# Patient Record
Sex: Female | Born: 1995 | Race: Black or African American | Hispanic: No | Marital: Single | Smoking: Never smoker
Health system: Southern US, Community
[De-identification: ages and names within clinical notes are randomized; demographics above are authoritative.]

## PROBLEM LIST (undated history)

## (undated) ENCOUNTER — Inpatient Hospital Stay (HOSPITAL_COMMUNITY): Payer: Self-pay

## (undated) DIAGNOSIS — F319 Bipolar disorder, unspecified: Secondary | ICD-10-CM

## (undated) DIAGNOSIS — K2970 Gastritis, unspecified, without bleeding: Secondary | ICD-10-CM

## (undated) DIAGNOSIS — F329 Major depressive disorder, single episode, unspecified: Secondary | ICD-10-CM

## (undated) DIAGNOSIS — Z5189 Encounter for other specified aftercare: Secondary | ICD-10-CM

## (undated) DIAGNOSIS — F32A Depression, unspecified: Secondary | ICD-10-CM

## (undated) DIAGNOSIS — D649 Anemia, unspecified: Secondary | ICD-10-CM

## (undated) DIAGNOSIS — F419 Anxiety disorder, unspecified: Secondary | ICD-10-CM

## (undated) DIAGNOSIS — L409 Psoriasis, unspecified: Secondary | ICD-10-CM

## (undated) DIAGNOSIS — H539 Unspecified visual disturbance: Secondary | ICD-10-CM

## (undated) DIAGNOSIS — Z889 Allergy status to unspecified drugs, medicaments and biological substances status: Secondary | ICD-10-CM

## (undated) DIAGNOSIS — G43909 Migraine, unspecified, not intractable, without status migrainosus: Secondary | ICD-10-CM

## (undated) HISTORY — PX: WISDOM TOOTH EXTRACTION: SHX21

## (undated) HISTORY — PX: DILATION AND CURETTAGE OF UTERUS: SHX78

## (undated) HISTORY — DX: Encounter for other specified aftercare: Z51.89

## (undated) HISTORY — DX: Gastritis, unspecified, without bleeding: K29.70

---
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
## 2015-01-28 ENCOUNTER — Emergency Department (HOSPITAL_COMMUNITY): Payer: Medicaid Other

## 2015-01-28 ENCOUNTER — Emergency Department (HOSPITAL_COMMUNITY)
Admission: EM | Admit: 2015-01-28 | Discharge: 2015-01-28 | Disposition: A | Discharge status: Home / Self Care | Payer: Medicaid Other | Attending: Emergency Medicine | Admitting: Emergency Medicine

## 2015-01-28 ENCOUNTER — Encounter (HOSPITAL_COMMUNITY): Payer: Self-pay | Admitting: *Deleted

## 2015-01-28 DIAGNOSIS — Z8719 Personal history of other diseases of the digestive system: Secondary | ICD-10-CM | POA: Diagnosis not present

## 2015-01-28 DIAGNOSIS — Z8659 Personal history of other mental and behavioral disorders: Secondary | ICD-10-CM | POA: Insufficient documentation

## 2015-01-28 DIAGNOSIS — Z872 Personal history of diseases of the skin and subcutaneous tissue: Secondary | ICD-10-CM | POA: Diagnosis not present

## 2015-01-28 DIAGNOSIS — N83201 Unspecified ovarian cyst, right side: Secondary | ICD-10-CM

## 2015-01-28 DIAGNOSIS — Z3202 Encounter for pregnancy test, result negative: Secondary | ICD-10-CM | POA: Insufficient documentation

## 2015-01-28 DIAGNOSIS — R1030 Lower abdominal pain, unspecified: Secondary | ICD-10-CM | POA: Diagnosis present

## 2015-01-28 DIAGNOSIS — Z8669 Personal history of other diseases of the nervous system and sense organs: Secondary | ICD-10-CM | POA: Insufficient documentation

## 2015-01-28 DIAGNOSIS — Z8679 Personal history of other diseases of the circulatory system: Secondary | ICD-10-CM | POA: Insufficient documentation

## 2015-01-28 DIAGNOSIS — N8320 Unspecified ovarian cysts: Secondary | ICD-10-CM | POA: Diagnosis not present

## 2015-01-28 DIAGNOSIS — N83202 Unspecified ovarian cyst, left side: Secondary | ICD-10-CM

## 2015-01-28 DIAGNOSIS — Z79899 Other long term (current) drug therapy: Secondary | ICD-10-CM | POA: Diagnosis not present

## 2015-01-28 DIAGNOSIS — R109 Unspecified abdominal pain: Secondary | ICD-10-CM

## 2015-01-28 LAB — CBC WITH DIFFERENTIAL/PLATELET
BASOS PCT: 1 % (ref 0–1)
Basophils Absolute: 0 10*3/uL (ref 0.0–0.1)
EOS PCT: 1 % (ref 0–5)
Eosinophils Absolute: 0.1 10*3/uL (ref 0.0–0.7)
HCT: 41.3 % (ref 36.0–46.0)
Hemoglobin: 12.9 g/dL (ref 12.0–15.0)
Lymphocytes Relative: 35 % (ref 12–46)
Lymphs Abs: 2.8 10*3/uL (ref 0.7–4.0)
MCH: 24.9 pg — ABNORMAL LOW (ref 26.0–34.0)
MCHC: 31.2 g/dL (ref 30.0–36.0)
MCV: 79.6 fL (ref 78.0–100.0)
Monocytes Absolute: 0.7 10*3/uL (ref 0.1–1.0)
Monocytes Relative: 9 % (ref 3–12)
Neutro Abs: 4.5 10*3/uL (ref 1.7–7.7)
Neutrophils Relative %: 54 % (ref 43–77)
PLATELETS: 307 10*3/uL (ref 150–400)
RBC: 5.19 MIL/uL — ABNORMAL HIGH (ref 3.87–5.11)
RDW: 14.5 % (ref 11.5–15.5)
WBC: 8.1 10*3/uL (ref 4.0–10.5)

## 2015-01-28 LAB — URINALYSIS, ROUTINE W REFLEX MICROSCOPIC
Bilirubin Urine: NEGATIVE
Glucose, UA: NEGATIVE mg/dL
Hgb urine dipstick: NEGATIVE
Ketones, ur: NEGATIVE mg/dL
Leukocytes, UA: NEGATIVE
Nitrite: NEGATIVE
Protein, ur: NEGATIVE mg/dL
Specific Gravity, Urine: 1.027 (ref 1.005–1.030)
UROBILINOGEN UA: 0.2 mg/dL (ref 0.0–1.0)
pH: 6 (ref 5.0–8.0)

## 2015-01-28 LAB — COMPREHENSIVE METABOLIC PANEL
ALT: 34 U/L (ref 14–54)
AST: 23 U/L (ref 15–41)
Albumin: 3.9 g/dL (ref 3.5–5.0)
Alkaline Phosphatase: 76 U/L (ref 38–126)
Anion gap: 8 (ref 5–15)
BILIRUBIN TOTAL: 0.4 mg/dL (ref 0.3–1.2)
BUN: 21 mg/dL — ABNORMAL HIGH (ref 6–20)
CO2: 27 mmol/L (ref 22–32)
CREATININE: 0.63 mg/dL (ref 0.44–1.00)
Calcium: 9.2 mg/dL (ref 8.9–10.3)
Chloride: 104 mmol/L (ref 101–111)
Glucose, Bld: 94 mg/dL (ref 65–99)
Potassium: 3.7 mmol/L (ref 3.5–5.1)
Sodium: 139 mmol/L (ref 135–145)
Total Protein: 7.6 g/dL (ref 6.5–8.1)

## 2015-01-28 LAB — WET PREP, GENITAL
Trich, Wet Prep: NONE SEEN
Yeast Wet Prep HPF POC: NONE SEEN

## 2015-01-28 LAB — POC URINE PREG, ED: PREG TEST UR: NEGATIVE

## 2015-01-28 LAB — HCG, QUANTITATIVE, PREGNANCY

## 2015-01-28 LAB — LIPASE, BLOOD: LIPASE: 55 U/L — AB (ref 22–51)

## 2015-01-28 MED ORDER — IOHEXOL 300 MG/ML  SOLN
100.0000 mL | Freq: Once | INTRAMUSCULAR | Status: AC | PRN
  Administered 2015-01-28: 100 mL via INTRAVENOUS

## 2015-01-28 MED ORDER — ACETAMINOPHEN 325 MG PO TABS
325.0000 mg | ORAL_TABLET | Freq: Once | ORAL | Status: AC
  Administered 2015-01-28: 325 mg via ORAL
  Filled 2015-01-28: qty 1

## 2015-01-28 MED ORDER — SODIUM CHLORIDE 0.9 % IV BOLUS (SEPSIS)
1000.0000 mL | Freq: Once | INTRAVENOUS | Status: AC
  Administered 2015-01-28: 1000 mL via INTRAVENOUS

## 2015-01-28 MED ORDER — IOHEXOL 300 MG/ML  SOLN
50.0000 mL | Freq: Once | INTRAMUSCULAR | Status: AC | PRN
  Administered 2015-01-28: 50 mL via ORAL

## 2015-01-28 NOTE — ED Notes (Addendum)
Patient c/o diffuse sharp abdominal pain x3 weeks with nausea, headache and fatigue.  Patient also c/o craving odd combinations of food.  Patient states she took 10 OTC urine pregnancy tests and the fifth one was positive.  Patient denies hematuria, but endorses urinary frequency/urgency.  Patient c/o increased hunger.  Patient states she took Tylenol to treat pain with no relief.

## 2015-01-28 NOTE — ED Notes (Signed)
Patient transported to Ultrasound 

## 2015-01-28 NOTE — ED Provider Notes (Signed)
CSN: [id number]     Arrival date & time 01/28/15  8469 History   First MD Initiated Contact with Patient 01/28/15 1031     Chief Complaint  Patient presents with  . Abdominal Pain     (Consider location/radiation/quality/duration/timing/severity/associated sxs/prior Treatment) The history is provided by the patient. No language interpreter was used.  Traci Castro is an 19 y/o F with PMHx of psoriasis, gastritis, migraine, anxiety, depression presenting to the ED with abdominal pain that has been ongoing for approximately 3 weeks. Patient reports that her pain is localized to the suprapubic region and radiates down her whole entire abdomen described as a constant throbbing sensation with intermittent stabbing pain. She's been having diarrhea continuously for the past 3 weeks-denied blood or mucus. Patient reports that she's been having normal bowel movements. Reports that her last menstrual periods 01/08/2015. States she is currently on birth control, but cannot remember what. Reported that she has history of headaches, reported that she continues to have headaches on and off-similar from previous has been using Tylenol with relief. Reported that over the past couple of months she has been having shortness of breath only with ambulation. Denied fever, chills, dysuria, decreased urine, hematuria, melena, hematochezia, blurred vision, sudden loss of vision, neck pain, neck stiffness, chest pain, difficult to breathing, cough, hemoptysis, nasal congestion, sore throat, difficulty swallowing, throat closing sensation, vaginal pain, vaginal discharge, vaginal bleeding, numbness, tingling. PCP Dr. Edward Qualia   Past Medical History  Diagnosis Date  . Psoriasis   . Gastritis     2 bouts  . Vision abnormalities     wears glasses  . Migraine   . Anxiety   . Depression    Past Surgical History  Procedure Laterality Date  . Esophagogastroduodenoscopy N/A 10/17/2012    Procedure: ESOPHAGOGASTRODUODENOSCOPY  (EGD);  Surgeon: Jon Gills, MD;  Location: [facility] OR;  Service: Gastroenterology;  Laterality: N/A;   Family History  Problem Relation Age of Onset  . Cholelithiasis Mother   . Hyperlipidemia Mother   . Hypertension Mother   . Mental illness Mother   . Cholelithiasis Maternal Uncle   . Hyperlipidemia Maternal Uncle   . Hypertension Maternal Uncle   . Cholelithiasis Maternal Grandmother   . Arthritis Maternal Grandmother   . COPD Maternal Grandmother   . Depression Maternal Grandmother   . Diabetes Maternal Grandmother   . Heart disease Maternal Grandmother   . Hyperlipidemia Maternal Grandmother   . Hypertension Maternal Grandmother   . Ulcers Neg Hx   . Alcohol abuse Neg Hx   . Asthma Neg Hx   . Birth defects Neg Hx   . Cancer Neg Hx   . Drug abuse Neg Hx   . Early death Neg Hx   . Hearing loss Neg Hx   . Kidney disease Neg Hx   . Mental retardation Neg Hx   . Miscarriages / Stillbirths Neg Hx   . Stroke Neg Hx   . Vision loss Neg Hx   . Learning disabilities Paternal Grandmother   . Mental illness Paternal Grandmother    History  Substance Use Topics  . Smoking status: Never Smoker   . Smokeless tobacco: Never Used  . Alcohol Use: No   OB History    No data available     Review of Systems  Constitutional: Negative for fever and chills.  Respiratory: Negative for chest tightness.   Cardiovascular: Negative for chest pain.  Gastrointestinal: Positive for abdominal pain and diarrhea. Negative for nausea, vomiting,  constipation, blood in stool and anal bleeding.  Genitourinary: Positive for pelvic pain. Negative for dysuria, vaginal bleeding, vaginal discharge and vaginal pain.  Musculoskeletal: Negative for back pain, neck pain and neck stiffness.  Neurological: Positive for headaches.      Allergies  Tramadol  Home Medications   Prior to Admission medications   Medication Sig Start Date End Date Taking? Authorizing Provider  acetaminophen (TYLENOL) 500  MG tablet Take 1,000 mg by mouth every 6 (six) hours as needed for moderate pain.    Yes Historical Provider, MD  adalimumab (HUMIRA PEN) 40 MG/0.8ML injection Inject 40 mg into the skin every 14 (fourteen) days.   Yes Historical Provider, MD  HYDROcodone-acetaminophen (NORCO) 5-325 MG per tablet Take 1 tablet by mouth every 6 (six) hours as needed for severe pain. Patient not taking: Reported on 01/28/2015 08/09/14   Pricilla Loveless, MD  ondansetron (ZOFRAN ODT) 4 MG disintegrating tablet Take 1 tablet (4 mg total) by mouth every 8 (eight) hours as needed for nausea or vomiting. Patient not taking: Reported on 01/28/2015 07/29/14   Kaitlyn Szekalski, PA-C   BP 105/57 mmHg  Pulse 66  Temp(Src) 98.2 F (36.8 C) (Oral)  Resp 18  Ht  (1.6 m)  Wt 285 lb (129.275 kg)  BMI 50.50 kg/m2  SpO2 100%  LMP 01/08/2015 Physical Exam  Constitutional: She is oriented to person, place, and time. She appears well-developed and well-nourished. No distress.  Patient pleasant and interactive, and giggling during examination  HENT:  Head: Normocephalic and atraumatic.  Mouth/Throat: Oropharynx is clear and moist. No oropharyngeal exudate.  Eyes: Conjunctivae and EOM are normal. Pupils are equal, round, and reactive to light. Right eye exhibits no discharge. Left eye exhibits no discharge.  Neck: Normal range of motion. Neck supple. No tracheal deviation present.  Cardiovascular: Normal rate, regular rhythm and normal heart sounds.  Exam reveals no friction rub.   No murmur heard. Pulses:      Radial pulses are 2+ on the right side, and 2+ on the left side.       Dorsalis pedis pulses are 2+ on the right side, and 2+ on the left side.  Pulmonary/Chest: Effort normal and breath sounds normal. No respiratory distress. She has no wheezes. She has no rales. She exhibits no tenderness.  Patient is able to speak in full sentences without difficulty Negative use of accessory muscles Negative stridor  Abdominal:  Soft. Bowel sounds are normal. She exhibits no distension. There is tenderness (Diffuse tenderness, suprapubic mostly). There is no rebound and no guarding.  Overweight   Genitourinary:  Pelvic Exam: Negative swelling, erythema, inflammation, lesions, sores, deformities identified to the external genitalia. Negative erythema induration or fluctuance. Negative bright red blood in the vaginal vault. Thin white discharge identified negative odor, scant amount. Cervix identified negative friability. Positive CMT. Positive bilateral adnexal tenderness noted on examination. Exam chaperoned with nurse, Vernona Rieger  Musculoskeletal: Normal range of motion.  Lymphadenopathy:    She has no cervical adenopathy.  Neurological: She is alert and oriented to person, place, and time. No cranial nerve deficit. She exhibits normal muscle tone. Coordination normal. GCS eye subscore is 4. GCS verbal subscore is 5. GCS motor subscore is 6.  Skin: Skin is warm and dry. No rash noted. She is not diaphoretic. No erythema.  Psychiatric: She has a normal mood and affect. Her behavior is normal. Thought content normal.  Nursing note and vitals reviewed.   ED Course  Procedures (including critical care  time)  Results for orders placed or performed during the hospital encounter of 01/28/15  Wet prep, genital  Result Value Ref Range   Yeast Wet Prep HPF POC NONE SEEN NONE SEEN   Trich, Wet Prep NONE SEEN NONE SEEN   Clue Cells Wet Prep HPF POC FEW (A) NONE SEEN   WBC, Wet Prep HPF POC FEW (A) NONE SEEN  Urinalysis, Routine w reflex microscopic (not at [facility])  Result Value Ref Range   Color, Urine YELLOW YELLOW   APPearance CLOUDY (A) CLEAR   Specific Gravity, Urine 1.027 1.005 - 1.030   pH 6.0 5.0 - 8.0   Glucose, UA NEGATIVE NEGATIVE mg/dL   Hgb urine dipstick NEGATIVE NEGATIVE   Bilirubin Urine NEGATIVE NEGATIVE   Ketones, ur NEGATIVE NEGATIVE mg/dL   Protein, ur NEGATIVE NEGATIVE mg/dL   Urobilinogen, UA 0.2 0.0 -  1.0 mg/dL   Nitrite NEGATIVE NEGATIVE   Leukocytes, UA NEGATIVE NEGATIVE  CBC with Differential/Platelet  Result Value Ref Range   WBC 8.1 4.0 - 10.5 K/uL   RBC 5.19 (H) 3.87 - 5.11 MIL/uL   Hemoglobin 12.9 12.0 - 15.0 g/dL   HCT 53.6 64.4 - 03.4 %   MCV 79.6 78.0 - 100.0 fL   MCH 24.9 (L) 26.0 - 34.0 pg   MCHC 31.2 30.0 - 36.0 g/dL   RDW 74.2 59.5 - 63.8 %   Platelets 307 150 - 400 K/uL   Neutrophils Relative % 54 43 - 77 %   Neutro Abs 4.5 1.7 - 7.7 K/uL   Lymphocytes Relative 35 12 - 46 %   Lymphs Abs 2.8 0.7 - 4.0 K/uL   Monocytes Relative 9 3 - 12 %   Monocytes Absolute 0.7 0.1 - 1.0 K/uL   Eosinophils Relative 1 0 - 5 %   Eosinophils Absolute 0.1 0.0 - 0.7 K/uL   Basophils Relative 1 0 - 1 %   Basophils Absolute 0.0 0.0 - 0.1 K/uL  Comprehensive metabolic panel  Result Value Ref Range   Sodium 139 135 - 145 mmol/L   Potassium 3.7 3.5 - 5.1 mmol/L   Chloride 104 101 - 111 mmol/L   CO2 27 22 - 32 mmol/L   Glucose, Bld 94 65 - 99 mg/dL   BUN 21 (H) 6 - 20 mg/dL   Creatinine, Ser 7.56 0.44 - 1.00 mg/dL   Calcium 9.2 8.9 - 43.3 mg/dL   Total Protein 7.6 6.5 - 8.1 g/dL   Albumin 3.9 3.5 - 5.0 g/dL   AST 23 15 - 41 U/L   ALT 34 14 - 54 U/L   Alkaline Phosphatase 76 38 - 126 U/L   Total Bilirubin 0.4 0.3 - 1.2 mg/dL   GFR calc non Af Amer >60 >60 mL/min   GFR calc Af Amer >60 >60 mL/min   Anion gap 8 5 - 15  hCG, quantitative, pregnancy  Result Value Ref Range   hCG, Beta Chain, Quant, S <1 <5 mIU/mL  Lipase, blood  Result Value Ref Range   Lipase 55 (H) 22 - 51 U/L  POC Urine Pregnancy, ED (do NOT order at [facility])  Result Value Ref Range   Preg Test, Ur NEGATIVE NEGATIVE   Labs Review Labs Reviewed  WET PREP, GENITAL - Abnormal; Notable for the following:    Clue Cells Wet Prep HPF POC FEW (*)    WBC, Wet Prep HPF POC FEW (*)    All other components within normal limits  URINALYSIS, ROUTINE W REFLEX  MICROSCOPIC (NOT AT [facility]) - Abnormal; Notable for the  following:    APPearance CLOUDY (*)    All other components within normal limits  CBC WITH DIFFERENTIAL/PLATELET - Abnormal; Notable for the following:    RBC 5.19 (*)    MCH 24.9 (*)    All other components within normal limits  COMPREHENSIVE METABOLIC PANEL - Abnormal; Notable for the following:    BUN 21 (*)    All other components within normal limits  LIPASE, BLOOD - Abnormal; Notable for the following:    Lipase 55 (*)    All other components within normal limits  HCG, QUANTITATIVE, PREGNANCY  POC URINE PREG, ED  GC/CHLAMYDIA PROBE AMP ([place]) NOT AT [facility]    Imaging Review [place] Transvaginal Non-ob  01/28/2015   CLINICAL DATA:  Pelvic pain for 3 weeks with nausea and vomiting. Reported negative pregnancy test  EXAM: TRANSABDOMINAL AND TRANSVAGINAL ULTRASOUND OF PELVIS  DOPPLER ULTRASOUND OF OVARIES  TECHNIQUE: Study was performed transabdominally to optimize pelvic field of view evaluation and transvaginally to optimize internal visceral architecture evaluation.  Color and duplex Doppler ultrasound was utilized to evaluate blood flow to the ovaries.  COMPARISON:  August 09, 2014  FINDINGS: Uterus  Measurements: 8.5 x 3.6 x 5.2 cm. No fibroids or other mass visualized.  Endometrium  Thickness: 15 mm.  No focal abnormality visualized.  Right ovary  Measurements: 5.6 x 3.5 x 4.1 cm. There is a cyst arising in the right ovary measuring 3.4 x 2.9 x 2.9 cm. Normal appearance/no adnexal mass.  Left ovary  Measurements: 2.6 x 2.7 x 3.6 cm. There is a cyst in adjacent to the left ovary and possibly arising eccentrically from the left ovary measuring 3.2 x 2.3 x 3.3 cm.  Pulsed Doppler evaluation of both ovaries demonstrates normal low-resistance arterial and venous waveforms. The peak systolic velocity in the right ovary is 8.9 centimeter/second with an end-diastolic velocity of 4.9 cm/sec. The peak systolic velocity in the left ovary is 8.0 cm with end-diastolic velocity of 3.6  centimeter/second.  Other findings  Trace free fluid.  IMPRESSION: There is a small cyst in each ovary. These cysts appear to represent simple cysts. No other pelvic or adnexal masses. This is almost certainly benign, and no specific imaging follow up is recommended according to the Society of Radiologists in Ultrasound2010 Consensus Conference Statement (D Lenis NoonLevine et al. Management of Asymptomatic Ovarian and Other Adnexal Cysts Imaged at [place]: Society of Radiologists in Ultrasound Consensus Conference Statement 2010. Radiology 256 (Sept 2010): 943-954.). No intrauterine lesion. No evidence of ovarian torsion on either side. Trace free fluid may be physiologic.   Electronically Signed   By: Bretta BangWilliam  Woodruff III M.D.   On: 01/28/2015 13:29   [place] Pelvis Complete  01/28/2015   CLINICAL DATA:  Pelvic pain for 3 weeks with nausea and vomiting. Reported negative pregnancy test  EXAM: TRANSABDOMINAL AND TRANSVAGINAL ULTRASOUND OF PELVIS  DOPPLER ULTRASOUND OF OVARIES  TECHNIQUE: Study was performed transabdominally to optimize pelvic field of view evaluation and transvaginally to optimize internal visceral architecture evaluation.  Color and duplex Doppler ultrasound was utilized to evaluate blood flow to the ovaries.  COMPARISON:  August 09, 2014  FINDINGS: Uterus  Measurements: 8.5 x 3.6 x 5.2 cm. No fibroids or other mass visualized.  Endometrium  Thickness: 15 mm.  No focal abnormality visualized.  Right ovary  Measurements: 5.6 x 3.5 x 4.1 cm. There is a cyst arising in the right ovary measuring 3.4 x  2.9 x 2.9 cm. Normal appearance/no adnexal mass.  Left ovary  Measurements: 2.6 x 2.7 x 3.6 cm. There is a cyst in adjacent to the left ovary and possibly arising eccentrically from the left ovary measuring 3.2 x 2.3 x 3.3 cm.  Pulsed Doppler evaluation of both ovaries demonstrates normal low-resistance arterial and venous waveforms. The peak systolic velocity in the right ovary is 8.9 centimeter/second with an  end-diastolic velocity of 4.9 cm/sec. The peak systolic velocity in the left ovary is 8.0 cm with end-diastolic velocity of 3.6 centimeter/second.  Other findings  Trace free fluid.  IMPRESSION: There is a small cyst in each ovary. These cysts appear to represent simple cysts. No other pelvic or adnexal masses. This is almost certainly benign, and no specific imaging follow up is recommended according to the Society of Radiologists in Ultrasound2010 Consensus Conference Statement (D Lenis Noon et al. Management of Asymptomatic Ovarian and Other Adnexal Cysts Imaged at [place]: Society of Radiologists in Ultrasound Consensus Conference Statement 2010. Radiology 256 (Sept 2010): 943-954.). No intrauterine lesion. No evidence of ovarian torsion on either side. Trace free fluid may be physiologic.   Electronically Signed   By: Bretta Bang III M.D.   On: 01/28/2015 13:29   Ct Abdomen Pelvis W Contrast  01/28/2015   CLINICAL DATA:  Sharp abdominal pain with nausea for 3 weeks.  EXAM: CT ABDOMEN AND PELVIS WITH CONTRAST  TECHNIQUE: Multidetector CT imaging of the abdomen and pelvis was performed using the standard protocol following bolus administration of intravenous contrast.  CONTRAST:  50mL OMNIPAQUE IOHEXOL 300 MG/ML SOLN, OMNIPAQUE IOHEXOL 300 MG/ML SOLN  COMPARISON:  CT scan dated 07/29/2014 and pelvic ultrasound dated 01/28/2015  FINDINGS: Liver, biliary tree, spleen, pancreas, adrenal glands, and kidneys are normal. Bowel is normal including the terminal ileum and appendix.  No adenopathy, free air, or free fluid.  Uterus is normal. 3.4 cm cyst on the otherwise normal right ovary as demonstrated on the pelvic ultrasound of this same date. This is new since the CT scan of 07/29/2014.  There is a stable 2.4 cm cyst on the left ovary, unchanged since the prior CT scan. Uterus and bladder appear normal. No osseous abnormality.  IMPRESSION: 3.4 cm cyst on the right ovary, new since December 2015. Stable 2.4 cm  cyst on the left ovary. Otherwise, normal exam.   Electronically Signed   By: Francene Boyers M.D.   On: 01/28/2015 16:22   [place] Art/ven Flow Abd Pelv Doppler  01/28/2015   CLINICAL DATA:  Pelvic pain for 3 weeks with nausea and vomiting. Reported negative pregnancy test  EXAM: TRANSABDOMINAL AND TRANSVAGINAL ULTRASOUND OF PELVIS  DOPPLER ULTRASOUND OF OVARIES  TECHNIQUE: Study was performed transabdominally to optimize pelvic field of view evaluation and transvaginally to optimize internal visceral architecture evaluation.  Color and duplex Doppler ultrasound was utilized to evaluate blood flow to the ovaries.  COMPARISON:  August 09, 2014  FINDINGS: Uterus  Measurements: 8.5 x 3.6 x 5.2 cm. No fibroids or other mass visualized.  Endometrium  Thickness: 15 mm.  No focal abnormality visualized.  Right ovary  Measurements: 5.6 x 3.5 x 4.1 cm. There is a cyst arising in the right ovary measuring 3.4 x 2.9 x 2.9 cm. Normal appearance/no adnexal mass.  Left ovary  Measurements: 2.6 x 2.7 x 3.6 cm. There is a cyst in adjacent to the left ovary and possibly arising eccentrically from the left ovary measuring 3.2 x 2.3 x 3.3 cm.  Pulsed Doppler  evaluation of both ovaries demonstrates normal low-resistance arterial and venous waveforms. The peak systolic velocity in the right ovary is 8.9 centimeter/second with an end-diastolic velocity of 4.9 cm/sec. The peak systolic velocity in the left ovary is 8.0 cm with end-diastolic velocity of 3.6 centimeter/second.  Other findings  Trace free fluid.  IMPRESSION: There is a small cyst in each ovary. These cysts appear to represent simple cysts. No other pelvic or adnexal masses. This is almost certainly benign, and no specific imaging follow up is recommended according to the Society of Radiologists in Ultrasound2010 Consensus Conference Statement (D Lenis Noon et al. Management of Asymptomatic Ovarian and Other Adnexal Cysts Imaged at [place]: Society of Radiologists in Ultrasound  Consensus Conference Statement 2010. Radiology 256 (Sept 2010): 943-954.). No intrauterine lesion. No evidence of ovarian torsion on either side. Trace free fluid may be physiologic.   Electronically Signed   By: Bretta Bang III M.D.   On: 01/28/2015 13:29     EKG Interpretation None      MDM   Final diagnoses:  Abdominal pain  Bilateral ovarian cysts    Medications  sodium chloride 0.9 % bolus 1,000 mL (0 mLs Intravenous Stopped 01/28/15 1746)  acetaminophen (TYLENOL) tablet 325 mg (325 mg Oral Given 01/28/15 1337)  iohexol (OMNIPAQUE) 300 MG/ML solution 50 mL (50 mLs Oral Contrast Given 01/28/15 1453)  iohexol (OMNIPAQUE) 300 MG/ML solution 100 mL (100 mLs Intravenous Contrast Given 01/28/15 1602)    Filed Vitals:   01/28/15 0939 01/28/15 1345 01/28/15 1727  BP: 117/85 95/56 105/57  Pulse: 78 63 66  Temp: 98.2 F (36.8 C)    TempSrc: Oral    Resp: 19 18 18   Height: 5\' 3"  (1.6 m)    Weight: 285 lb (129.275 kg)    SpO2: 100% 100% 100%   CBC negative elevated leukocytosis. Hemoglobin 12.9, hematocrit 41.3. CMP unremarkable. Lipase mildly elevated at 55. Beta-hCG less than 1. Urine pregnancy negative. Urinalysis negative for hemoglobin, nitrites, leukocytes-negative findings of infection. Wet prep noted few WBC, few clue cells. Pelvic ultrasound noted small cyst in each ovary, the cysts appear to represent simple cysts. No other pelvic or adnexal masses noted. Recommend repeat ultrasound within approximately 3-6 months. No intrauterine lesion. No evidence of torsion on either side. Trace free fluid may be physiological. CT abdomen and pelvis with contrast noted 3.4 cm right ovarian cyst and 2.4 cm cyst on the left ovary.  Doubt PID. Doubt TOA. Doubt ovarian torsion. Negative findings of UTI or pyelonephritis. Negative findings of acute abdominal abnormalities. Negative findings of appendicitis. Negative findings of colitis. Negative findings of cholangitis, cholecystitis. PERC 0 -  doubt PE - negative hypoxia, tachynpea, tachycardia. Ovarian cysts noted on [place] and CT. Patient stable, afebrile. Patient not septic appearing. Negative signs of respiratory distress. Discharged patient. Discussed with patient to rest and stay hydrated. Discussed with patient follow up with OBGYN. Discussed with patient to closely monitor symptoms and if symptoms are to worsen or change to report back to the ED - strict return instructions given.  Patient agreed to plan of care, understood, all questions answered.   Raymon Mutton, PA-C 01/28/15 1818  Benjiman Core, MD 01/31/15 [phone]

## 2015-01-28 NOTE — ED Notes (Signed)
Pelvic set up

## 2015-01-28 NOTE — Discharge Instructions (Signed)
Please call your doctor for a followup appointment within 24-48 hours. When you talk to your doctor please let them know that you were seen in the emergency department and have them acquire all of your records so that they can discuss the findings with you and formulate a treatment plan to fully care for your new and ongoing problems. Please call and set-up an appointment with your primary care provider Please call and set-up an appointment with OBGYN for re-assessment and US to be performed Please rest and stay hydrated Please continue to monitor symptoms closely and if symptoms are to worsen or change (fever greater than 101, chills, sweating, nausea, vomiting, chest pain, shortness of breathe, difficulty breathing, weakness, numbness, tingling, worsening or changes to pain pattern, abdominal bloating, abnormal vaginal bleeding, back pain, neck pain, vaginal discharge, decreased urination, blood in the urine, burning with urination) please report back to the Emergency Department immediately.   Ovarian Cyst An ovarian cyst is a fluid-filled sac that forms on an ovary. The ovaries are small organs that produce eggs in women. Various types of cysts can form on the ovaries. Most are not cancerous. Many do not cause problems, and they often go away on their own. Some may cause symptoms and require treatment. Common types of ovarian cysts include:  Functional cysts--These cysts may occur every month during the menstrual cycle. This is normal. The cysts usually go away with the next menstrual cycle if the woman does not get pregnant. Usually, there are no symptoms with a functional cyst.  Endometrioma cysts--These cysts form from the tissue that lines the uterus. They are also called "chocolate cysts" because they become filled with blood that turns brown. This type of cyst can cause pain in the lower abdomen during intercourse and with your menstrual period.  Cystadenoma cysts--This type develops from the  cells on the outside of the ovary. These cysts can get very big and cause lower abdomen pain and pain with intercourse. This type of cyst can twist on itself, cut off its blood supply, and cause severe pain. It can also easily rupture and cause a lot of pain.  Dermoid cysts--This type of cyst is sometimes found in both ovaries. These cysts may contain different kinds of body tissue, such as skin, teeth, hair, or cartilage. They usually do not cause symptoms unless they get very big.  Theca lutein cysts--These cysts occur when too much of a certain hormone (human chorionic gonadotropin) is produced and overstimulates the ovaries to produce an egg. This is most common after procedures used to assist with the conception of a baby (in vitro fertilization). CAUSES   Fertility drugs can cause a condition in which multiple large cysts are formed on the ovaries. This is called ovarian hyperstimulation syndrome.  A condition called polycystic ovary syndrome can cause hormonal imbalances that can lead to nonfunctional ovarian cysts. SIGNS AND SYMPTOMS  Many ovarian cysts do not cause symptoms. If symptoms are present, they may include:  Pelvic pain or pressure.  Pain in the lower abdomen.  Pain during sexual intercourse.  Increasing girth (swelling) of the abdomen.  Abnormal menstrual periods.  Increasing pain with menstrual periods.  Stopping having menstrual periods without being pregnant. DIAGNOSIS  These cysts are commonly found during a routine or annual pelvic exam. Tests may be ordered to find out more about the cyst. These tests may include:  Ultrasound.  X-ray of the pelvis.  CT scan.  MRI.  Blood tests. TREATMENT  Many ovarian cysts  go away on their own without treatment. Your health care provider may want to check your cyst regularly for 2-3 months to see if it changes. For women in menopause, it is particularly important to monitor a cyst closely because of the higher rate of  ovarian cancer in menopausal women. When treatment is needed, it may include any of the following:  A procedure to drain the cyst (aspiration). This may be done using a long needle and ultrasound. It can also be done through a laparoscopic procedure. This involves using a thin, lighted tube with a tiny camera on the end (laparoscope) inserted through a small incision.  Surgery to remove the whole cyst. This may be done using laparoscopic surgery or an open surgery involving a larger incision in the lower abdomen.  Hormone treatment or birth control pills. These methods are sometimes used to help dissolve a cyst. HOME CARE INSTRUCTIONS   Only take over-the-counter or prescription medicines as directed by your health care provider.  Follow up with your health care provider as directed.  Get regular pelvic exams and Pap tests. SEEK MEDICAL CARE IF:   Your periods are late, irregular, or painful, or they stop.  Your pelvic pain or abdominal pain does not go away.  Your abdomen becomes larger or swollen.  You have pressure on your bladder or trouble emptying your bladder completely.  You have pain during sexual intercourse.  You have feelings of fullness, pressure, or discomfort in your stomach.  You lose weight for no apparent reason.  You feel generally ill.  You become constipated.  You lose your appetite.  You develop acne.  You have an increase in body and facial hair.  You are gaining weight, without changing your exercise and eating habits.  You think you are pregnant. SEEK IMMEDIATE MEDICAL CARE IF:   You have increasing abdominal pain.  You feel sick to your stomach (nauseous), and you throw up (vomit).  You develop a fever that comes on suddenly.  You have abdominal pain during a bowel movement.  Your menstrual periods become heavier than usual. MAKE SURE YOU:  Understand these instructions.  Will watch your condition.  Will get help right away if you  are not doing well or get worse. Document Released: 08/13/2005 Document Revised: 08/18/2013 Document Reviewed: 04/20/2013 [facility] Patient Information 2015 [place], [place]. This information is not intended to replace advice given to you by your health care provider. Make sure you discuss any questions you have with your health care provider.

## 2015-01-31 LAB — GC/CHLAMYDIA PROBE AMP (~~LOC~~) NOT AT ARMC
Chlamydia: NEGATIVE
Neisseria Gonorrhea: NEGATIVE

## 2015-02-25 ENCOUNTER — Emergency Department (HOSPITAL_COMMUNITY): Payer: Medicaid Other

## 2015-02-25 ENCOUNTER — Encounter (HOSPITAL_COMMUNITY): Payer: Self-pay

## 2015-02-25 ENCOUNTER — Emergency Department (HOSPITAL_COMMUNITY)
Admission: EM | Admit: 2015-02-25 | Discharge: 2015-02-26 | Disposition: A | Discharge status: Home / Self Care | Payer: Medicaid Other | Attending: Emergency Medicine | Admitting: Emergency Medicine

## 2015-02-25 DIAGNOSIS — Z8719 Personal history of other diseases of the digestive system: Secondary | ICD-10-CM | POA: Insufficient documentation

## 2015-02-25 DIAGNOSIS — Z8659 Personal history of other mental and behavioral disorders: Secondary | ICD-10-CM | POA: Diagnosis not present

## 2015-02-25 DIAGNOSIS — Z3202 Encounter for pregnancy test, result negative: Secondary | ICD-10-CM | POA: Insufficient documentation

## 2015-02-25 DIAGNOSIS — N390 Urinary tract infection, site not specified: Secondary | ICD-10-CM | POA: Insufficient documentation

## 2015-02-25 DIAGNOSIS — E669 Obesity, unspecified: Secondary | ICD-10-CM | POA: Diagnosis not present

## 2015-02-25 DIAGNOSIS — Z872 Personal history of diseases of the skin and subcutaneous tissue: Secondary | ICD-10-CM | POA: Insufficient documentation

## 2015-02-25 DIAGNOSIS — Z8669 Personal history of other diseases of the nervous system and sense organs: Secondary | ICD-10-CM | POA: Diagnosis not present

## 2015-02-25 DIAGNOSIS — R1011 Right upper quadrant pain: Secondary | ICD-10-CM | POA: Diagnosis present

## 2015-02-25 DIAGNOSIS — Z792 Long term (current) use of antibiotics: Secondary | ICD-10-CM | POA: Insufficient documentation

## 2015-02-25 LAB — CBC WITH DIFFERENTIAL/PLATELET
BASOS ABS: 0 10*3/uL (ref 0.0–0.1)
Basophils Relative: 0 % (ref 0–1)
Eosinophils Absolute: 0.2 10*3/uL (ref 0.0–0.7)
Eosinophils Relative: 2 % (ref 0–5)
HEMATOCRIT: 40.2 % (ref 36.0–46.0)
Hemoglobin: 12.5 g/dL (ref 12.0–15.0)
LYMPHS ABS: 3.9 10*3/uL (ref 0.7–4.0)
Lymphocytes Relative: 38 % (ref 12–46)
MCH: 24.9 pg — ABNORMAL LOW (ref 26.0–34.0)
MCHC: 31.1 g/dL (ref 30.0–36.0)
MCV: 79.9 fL (ref 78.0–100.0)
MONOS PCT: 8 % (ref 3–12)
Monocytes Absolute: 0.8 10*3/uL (ref 0.1–1.0)
NEUTROS PCT: 52 % (ref 43–77)
Neutro Abs: 5.4 10*3/uL (ref 1.7–7.7)
Platelets: 310 10*3/uL (ref 150–400)
RBC: 5.03 MIL/uL (ref 3.87–5.11)
RDW: 14.2 % (ref 11.5–15.5)
WBC: 10.3 10*3/uL (ref 4.0–10.5)

## 2015-02-25 LAB — URINALYSIS, ROUTINE W REFLEX MICROSCOPIC
BILIRUBIN URINE: NEGATIVE
GLUCOSE, UA: NEGATIVE mg/dL
Ketones, ur: NEGATIVE mg/dL
NITRITE: NEGATIVE
PH: 7 (ref 5.0–8.0)
Protein, ur: NEGATIVE mg/dL
Specific Gravity, Urine: 1.015 (ref 1.005–1.030)
UROBILINOGEN UA: 0.2 mg/dL (ref 0.0–1.0)

## 2015-02-25 LAB — COMPREHENSIVE METABOLIC PANEL
ALT: 20 U/L (ref 14–54)
ANION GAP: 8 (ref 5–15)
AST: 19 U/L (ref 15–41)
Albumin: 4.1 g/dL (ref 3.5–5.0)
Alkaline Phosphatase: 82 U/L (ref 38–126)
BUN: 17 mg/dL (ref 6–20)
CO2: 27 mmol/L (ref 22–32)
CREATININE: 0.98 mg/dL (ref 0.44–1.00)
Calcium: 8.9 mg/dL (ref 8.9–10.3)
Chloride: 102 mmol/L (ref 101–111)
GFR calc non Af Amer: >60 mL/min (ref >60)
Glucose, Bld: 91 mg/dL (ref 65–99)
Potassium: 3.8 mmol/L (ref 3.5–5.1)
Sodium: 137 mmol/L (ref 135–145)
TOTAL PROTEIN: 7.6 g/dL (ref 6.5–8.1)
Total Bilirubin: 0.5 mg/dL (ref 0.3–1.2)

## 2015-02-25 LAB — URINE MICROSCOPIC-ADD ON

## 2015-02-25 LAB — POC URINE PREG, ED: Preg Test, Ur: NEGATIVE

## 2015-02-25 MED ORDER — FENTANYL CITRATE (PF) 100 MCG/2ML IJ SOLN
50.0000 ug | Freq: Once | INTRAMUSCULAR | Status: AC
  Administered 2015-02-25: 50 ug via INTRAVENOUS
  Filled 2015-02-25: qty 2

## 2015-02-25 MED ORDER — CEPHALEXIN 500 MG PO CAPS: 500.0000 mg | ORAL_CAPSULE | Freq: Four times a day (QID) | ORAL | Status: DC

## 2015-02-25 NOTE — ED Provider Notes (Signed)
CSN: [id number]     Arrival date & time 02/25/15  2150 History   First MD Initiated Contact with Patient 02/25/15 2202     Chief Complaint  Patient presents with  . Abdominal Pain     (Consider location/radiation/quality/duration/timing/severity/associated sxs/prior Treatment) HPI Traci Castro is a 19 y.o. female who doesn't for evaluation of right flank discomfort. Patient states for the past 2 or 3 weeks she has had a right upper quadrant pain that she describes as a "squeezing pain that will come and go". She denies any urinary symptoms, vaginal bleeding or discharge. She hasn't tried anything to improve her symptoms. Standing seems to make her symptoms better, lying down exacerbates and worsens her symptoms. She denies fevers, chills, nausea or vomiting chest pain, shortness of breath, diarrhea, constipation, numbness or weakness. No other aggravating or modifying factors.  Past Medical History  Diagnosis Date  . Psoriasis   . Gastritis     2 bouts  . Vision abnormalities     wears glasses  . Migraine   . Anxiety   . Depression    Past Surgical History  Procedure Laterality Date  . Esophagogastroduodenoscopy N/A 10/17/2012    Procedure: ESOPHAGOGASTRODUODENOSCOPY (EGD);  Surgeon: Jon GillsJoseph H Clark, MD;  Location: [facility] OR;  Service: Gastroenterology;  Laterality: N/A;   Family History  Problem Relation Age of Onset  . Cholelithiasis Mother   . Hyperlipidemia Mother   . Hypertension Mother   . Mental illness Mother   . Cholelithiasis Maternal Uncle   . Hyperlipidemia Maternal Uncle   . Hypertension Maternal Uncle   . Cholelithiasis Maternal Grandmother   . Arthritis Maternal Grandmother   . COPD Maternal Grandmother   . Depression Maternal Grandmother   . Diabetes Maternal Grandmother   . Heart disease Maternal Grandmother   . Hyperlipidemia Maternal Grandmother   . Hypertension Maternal Grandmother   . Ulcers Neg Hx   . Alcohol abuse Neg Hx   . Asthma Neg Hx   . Birth  defects Neg Hx   . Cancer Neg Hx   . Drug abuse Neg Hx   . Early death Neg Hx   . Hearing loss Neg Hx   . Kidney disease Neg Hx   . Mental retardation Neg Hx   . Miscarriages / Stillbirths Neg Hx   . Stroke Neg Hx   . Vision loss Neg Hx   . Learning disabilities Paternal Grandmother   . Mental illness Paternal Grandmother    History  Substance Use Topics  . Smoking status: Never Smoker   . Smokeless tobacco: Never Used  . Alcohol Use: No   OB History    No data available     Review of Systems A 10 point review of systems was completed and was negative except for pertinent positives and negatives as mentioned in the history of present illness     Allergies  Tramadol  Home Medications   Prior to Admission medications   Medication Sig Start Date End Date Taking? Authorizing Provider  ibuprofen (ADVIL,MOTRIN) 200 MG tablet Take 800 mg by mouth every 6 (six) hours as needed for moderate pain.   Yes Historical Provider, MD  metroNIDAZOLE (FLAGYL) 500 MG tablet Take 500 mg by mouth 2 (two) times daily.   Yes Historical Provider, MD  acetaminophen (TYLENOL) 500 MG tablet Take 1,000 mg by mouth every 6 (six) hours as needed for moderate pain.     Historical Provider, MD  adalimumab (HUMIRA PEN) 40 MG/0.8ML injection Inject  40 mg into the skin every 14 (fourteen) days.    Historical Provider, MD  HYDROcodone-acetaminophen (NORCO) 5-325 MG per tablet Take 1 tablet by mouth every 6 (six) hours as needed for severe pain. Patient not taking: Reported on 01/28/2015 08/09/14   Pricilla Loveless, MD  ondansetron (ZOFRAN ODT) 4 MG disintegrating tablet Take 1 tablet (4 mg total) by mouth every 8 (eight) hours as needed for nausea or vomiting. Patient not taking: Reported on 01/28/2015 07/29/14   Emilia Beck, PA-C   BP 131/69 mmHg  Pulse 81  Temp(Src) 97.9 F (36.6 C) (Oral)  Resp 20  SpO2 100%  LMP 02/06/2015 (Approximate) Physical Exam  Constitutional: She is oriented to person,  place, and time. She appears well-developed and well-nourished.  Obese  HENT:  Head: Normocephalic and atraumatic.  Mouth/Throat: Oropharynx is clear and moist.  Eyes: Conjunctivae are normal. Pupils are equal, round, and reactive to light. Right eye exhibits no discharge. Left eye exhibits no discharge. No scleral icterus.  Neck: Neck supple.  Cardiovascular: Normal rate, regular rhythm and normal heart sounds.   Pulmonary/Chest: Effort normal and breath sounds normal. No respiratory distress. She has no wheezes. She has no rales.  Abdominal: Soft. There is no tenderness.  Exam slightly limited due to body habitus and large pannus. However, patient does have tenderness to palpation diffusely throughout right flank, specifically within the intertriginous folds. Patient does have small areas of eczema diffusely throughout trunk. No other obvious lesions or deformities.  Musculoskeletal: She exhibits no tenderness.  Neurological: She is alert and oriented to person, place, and time.  Cranial Nerves II-XII grossly intact  Skin: Skin is warm and dry. No rash noted.  Psychiatric: She has a normal mood and affect.  Nursing note and vitals reviewed.   ED Course  Procedures (including critical care time) Labs Review Labs Reviewed  CBC WITH DIFFERENTIAL/PLATELET  COMPREHENSIVE METABOLIC PANEL  URINALYSIS, ROUTINE W REFLEX MICROSCOPIC (NOT AT [facility])  POC URINE PREG, ED    Imaging Review No results found.   EKG Interpretation None     Meds given in ED:  Medications  fentaNYL (SUBLIMAZE) injection 50 mcg (50 mcg Intravenous Given 02/25/15 2330)    New Prescriptions   CEPHALEXIN (KEFLEX) 500 MG CAPSULE    Take 1 capsule (500 mg total) by mouth 4 (four) times daily.   Filed Vitals:   02/25/15 2157 02/26/15 0016  BP: 131/69 113/66  Pulse: 81 78  Temp: 97.9 F (36.6 C)   TempSrc: Oral   Resp: 20 18  SpO2: 100% 100%    MDM  Vitals stable - WNL -afebrile Pt resting comfortably in  ED. PE--physical exam is not concerning. No overt abdominal tenderness. On reevaluation patient is lying comfortably on exam bed in no apparent distress. Labwork--evidence of UTI on urinalysis. Labs otherwise noncontributory. Imaging--ultrasound of right upper quadrant is normal. CT abdomen and pelvis on 6/3 showed stable bilateral ovarian cysts with no other intra-abdominal pathology.  DDX--we'll treat for UTI. No evidence of other acute or emergent pathology at this time. Recommend a follow-up primary care for further evaluation and management of symptoms.  I discussed all relevant lab findings and imaging results with pt and they verbalized understanding. Discussed f/u with PCP within 48 hrs and return precautions, pt very amenable to plan.   Final diagnoses:  RUQ pain       Joycie Peek, PA-C 02/26/15 0019  Samuel Jester, DO 02/28/15 [id number]

## 2015-02-25 NOTE — ED Notes (Signed)
Pt given urine cup and aware urine sample is needed 

## 2015-02-25 NOTE — ED Notes (Signed)
Pt presents with c/o abdominal pain in the lower quadrants and pain under her right rib cage area. Pt reports the pain started approx 2 weeks ago. Pt reports nausea but denies vomiting or diarrhea.

## 2015-02-25 NOTE — Discharge Instructions (Signed)
You were evaluated in the ED today for your flank pain. Your ultrasound was negative for any acute processes. You were found to have a UTI. You will be treated for this with antibiotic's. Basic artery and about as directed and follow-up with primary care for further evaluation and management of your symptoms.

## 2015-05-28 ENCOUNTER — Encounter (HOSPITAL_COMMUNITY): Payer: Self-pay | Admitting: Emergency Medicine

## 2015-05-28 ENCOUNTER — Emergency Department (HOSPITAL_COMMUNITY)
Admission: EM | Admit: 2015-05-28 | Discharge: 2015-05-28 | Disposition: A | Discharge status: Home / Self Care | Payer: Medicaid Other | Attending: Emergency Medicine | Admitting: Emergency Medicine

## 2015-05-28 DIAGNOSIS — R102 Pelvic and perineal pain: Secondary | ICD-10-CM | POA: Insufficient documentation

## 2015-05-28 DIAGNOSIS — Z8679 Personal history of other diseases of the circulatory system: Secondary | ICD-10-CM | POA: Diagnosis not present

## 2015-05-28 DIAGNOSIS — Z793 Long term (current) use of hormonal contraceptives: Secondary | ICD-10-CM | POA: Diagnosis not present

## 2015-05-28 DIAGNOSIS — Z3202 Encounter for pregnancy test, result negative: Secondary | ICD-10-CM | POA: Insufficient documentation

## 2015-05-28 DIAGNOSIS — Z79899 Other long term (current) drug therapy: Secondary | ICD-10-CM | POA: Diagnosis not present

## 2015-05-28 DIAGNOSIS — Z872 Personal history of diseases of the skin and subcutaneous tissue: Secondary | ICD-10-CM | POA: Diagnosis not present

## 2015-05-28 DIAGNOSIS — Z8742 Personal history of other diseases of the female genital tract: Secondary | ICD-10-CM | POA: Insufficient documentation

## 2015-05-28 DIAGNOSIS — K2970 Gastritis, unspecified, without bleeding: Secondary | ICD-10-CM | POA: Insufficient documentation

## 2015-05-28 DIAGNOSIS — R1030 Lower abdominal pain, unspecified: Secondary | ICD-10-CM | POA: Diagnosis present

## 2015-05-28 DIAGNOSIS — Z8659 Personal history of other mental and behavioral disorders: Secondary | ICD-10-CM | POA: Diagnosis not present

## 2015-05-28 LAB — WET PREP, GENITAL
CLUE CELLS WET PREP: NONE SEEN
TRICH WET PREP: NONE SEEN
YEAST WET PREP: NONE SEEN

## 2015-05-28 LAB — CBC
HEMATOCRIT: 36.9 % (ref 36.0–46.0)
HEMOGLOBIN: 11.5 g/dL — AB (ref 12.0–15.0)
MCH: 24.9 pg — ABNORMAL LOW (ref 26.0–34.0)
MCHC: 31.2 g/dL (ref 30.0–36.0)
MCV: 79.9 fL (ref 78.0–100.0)
Platelets: 322 10*3/uL (ref 150–400)
RBC: 4.62 MIL/uL (ref 3.87–5.11)
RDW: 14.7 % (ref 11.5–15.5)
WBC: 8.1 10*3/uL (ref 4.0–10.5)

## 2015-05-28 LAB — URINALYSIS, ROUTINE W REFLEX MICROSCOPIC
BILIRUBIN URINE: NEGATIVE
GLUCOSE, UA: NEGATIVE mg/dL
Ketones, ur: NEGATIVE mg/dL
Nitrite: NEGATIVE
Protein, ur: 30 mg/dL — AB
SPECIFIC GRAVITY, URINE: 1.026 (ref 1.005–1.030)
UROBILINOGEN UA: 0.2 mg/dL (ref 0.0–1.0)
pH: 6 (ref 5.0–8.0)

## 2015-05-28 LAB — COMPREHENSIVE METABOLIC PANEL
ALK PHOS: 67 U/L (ref 38–126)
ALT: 17 U/L (ref 14–54)
AST: 19 U/L (ref 15–41)
Albumin: 3.5 g/dL (ref 3.5–5.0)
Anion gap: 8 (ref 5–15)
BILIRUBIN TOTAL: 0.5 mg/dL (ref 0.3–1.2)
BUN: 9 mg/dL (ref 6–20)
CALCIUM: 8.7 mg/dL — AB (ref 8.9–10.3)
CO2: 24 mmol/L (ref 22–32)
Chloride: 105 mmol/L (ref 101–111)
Creatinine, Ser: 0.79 mg/dL (ref 0.44–1.00)
GFR calc Af Amer: >60 mL/min (ref >60)
GFR calc non Af Amer: >60 mL/min (ref >60)
Glucose, Bld: 97 mg/dL (ref 65–99)
POTASSIUM: 3.8 mmol/L (ref 3.5–5.1)
SODIUM: 137 mmol/L (ref 135–145)
TOTAL PROTEIN: 6.9 g/dL (ref 6.5–8.1)

## 2015-05-28 LAB — URINE MICROSCOPIC-ADD ON

## 2015-05-28 LAB — POC URINE PREG, ED: Preg Test, Ur: NEGATIVE

## 2015-05-28 LAB — LIPASE, BLOOD: Lipase: 57 U/L — ABNORMAL HIGH (ref 22–51)

## 2015-05-28 MED ORDER — ACETAMINOPHEN 500 MG PO TABS
1000.0000 mg | ORAL_TABLET | Freq: Once | ORAL | Status: AC
  Administered 2015-05-28: 1000 mg via ORAL
  Filled 2015-05-28: qty 2

## 2015-05-28 MED ORDER — IBUPROFEN 800 MG PO TABS: 800.0000 mg | ORAL_TABLET | Freq: Three times a day (TID) | ORAL | Status: DC

## 2015-05-28 MED ORDER — IBUPROFEN 800 MG PO TABS
800.0000 mg | ORAL_TABLET | Freq: Once | ORAL | Status: AC
  Administered 2015-05-28: 800 mg via ORAL
  Filled 2015-05-28: qty 1

## 2015-05-28 NOTE — ED Notes (Signed)
Pelvic cart setup bedside. 

## 2015-05-28 NOTE — ED Notes (Signed)
Pt verbalized understanding of d/c instructions and has no further questions. Pt stable and NAD.  

## 2015-05-28 NOTE — Discharge Instructions (Signed)
Abdominal Pain, Women °Abdominal (stomach, pelvic, or belly) pain can be caused by many things. It is important to tell your doctor: °· The location of the pain. °· Does it come and go or is it present all the time? °· Are there things that start the pain (eating certain foods, exercise)? °· Are there other symptoms associated with the pain (fever, nausea, vomiting, diarrhea)? °All of this is helpful to know when trying to find the cause of the pain. °CAUSES  °· Stomach: virus or bacteria infection, or ulcer. °· Intestine: appendicitis (inflamed appendix), regional ileitis (Crohn's disease), ulcerative colitis (inflamed colon), irritable bowel syndrome, diverticulitis (inflamed diverticulum of the colon), or cancer of the stomach or intestine. °· Gallbladder disease or stones in the gallbladder. °· Kidney disease, kidney stones, or infection. °· Pancreas infection or cancer. °· Fibromyalgia (pain disorder). °· Diseases of the female organs: °¨ Uterus: fibroid (non-cancerous) tumors or infection. °¨ Fallopian tubes: infection or tubal pregnancy. °¨ Ovary: cysts or tumors. °¨ Pelvic adhesions (scar tissue). °¨ Endometriosis (uterus lining tissue growing in the pelvis and on the pelvic organs). °¨ Pelvic congestion syndrome (female organs filling up with blood just before the menstrual period). °¨ Pain with the menstrual period. °¨ Pain with ovulation (producing an egg). °¨ Pain with an IUD (intrauterine device, birth control) in the uterus. °¨ Cancer of the female organs. °· Functional pain (pain not caused by a disease, may improve without treatment). °· Psychological pain. °· Depression. °DIAGNOSIS  °Your doctor will decide the seriousness of your pain by doing an examination. °· Blood tests. °· X-rays. °· Ultrasound. °· CT scan (computed tomography, special type of X-ray). °· MRI (magnetic resonance imaging). °· Cultures, for infection. °· Barium enema (dye inserted in the large intestine, to better view it with  X-rays). °· Colonoscopy (looking in intestine with a lighted tube). °· Laparoscopy (minor surgery, looking in abdomen with a lighted tube). °· Major abdominal exploratory surgery (looking in abdomen with a large incision). °TREATMENT  °The treatment will depend on the cause of the pain.  °· Many cases can be observed and treated at home. °· Over-the-counter medicines recommended by your caregiver. °· Prescription medicine. °· Antibiotics, for infection. °· Birth control pills, for painful periods or for ovulation pain. °· Hormone treatment, for endometriosis. °· Nerve blocking injections. °· Physical therapy. °· Antidepressants. °· Counseling with a psychologist or psychiatrist. °· Minor or major surgery. °HOME CARE INSTRUCTIONS  °· Do not take laxatives, unless directed by your caregiver. °· Take over-the-counter pain medicine only if ordered by your caregiver. Do not take aspirin because it can cause an upset stomach or bleeding. °· Try a clear liquid diet (broth or water) as ordered by your caregiver. Slowly move to a bland diet, as tolerated, if the pain is related to the stomach or intestine. °· Have a thermometer and take your temperature several times a day, and record it. °· Bed rest and sleep, if it helps the pain. °· Avoid sexual intercourse, if it causes pain. °· Avoid stressful situations. °· Keep your follow-up appointments and tests, as your caregiver orders. °· If the pain does not go away with medicine or surgery, you may try: °¨ Acupuncture. °¨ Relaxation exercises (yoga, meditation). °¨ Group therapy. °¨ Counseling. °SEEK MEDICAL CARE IF:  °· You notice certain foods cause stomach pain. °· Your home care treatment is not helping your pain. °· You need stronger pain medicine. °· You want your IUD removed. °· You feel faint or   lightheaded. °· You develop nausea and vomiting. °· You develop a rash. °· You are having side effects or an allergy to your medicine. °SEEK IMMEDIATE MEDICAL CARE IF:  °· Your  pain does not go away or gets worse. °· You have a fever. °· Your pain is felt only in portions of the abdomen. The right side could possibly be appendicitis. The left lower portion of the abdomen could be colitis or diverticulitis. °· You are passing blood in your stools (bright red or black tarry stools, with or without vomiting). °· You have blood in your urine. °· You develop chills, with or without a fever. °· You pass out. °MAKE SURE YOU:  °· Understand these instructions. °· Will watch your condition. °· Will get help right away if you are not doing well or get worse. °Document Released: 06/10/2007 Document Revised: 12/28/2013 Document Reviewed: 06/30/2009 °[employer]® Patient Information ©2015 [employer]. This information is not intended to replace advice given to you by your health care provider. Make sure you discuss any questions you have with your health care provider. ° °

## 2015-05-28 NOTE — ED Provider Notes (Signed)
CSN: [id number]     Arrival date & time 05/28/15  0043 History   First MD Initiated Contact with Patient 05/28/15 [phone]     Chief Complaint  Patient presents with  . Abdominal Pain     (Consider location/radiation/quality/duration/timing/severity/associated sxs/prior Treatment) HPI Patient reports today around 4pm she had sudden onset of cramping lower abdominal pain. She reports it continued to intensify. She is currently on her menstrual cycle. She denies she's having abnormal bleeding. She denies abnormal discharge. She reports she does have some ovarian cysts but this pain seems more than her usual menstrual pain. No associated vomiting no fevers no urinary symptoms. Patient poor she is sexually active. She denies risk of STD. Past Medical History  Diagnosis Date  . Psoriasis   . Gastritis     2 bouts  . Vision abnormalities     wears glasses  . Migraine   . Anxiety   . Depression    Past Surgical History  Procedure Laterality Date  . Esophagogastroduodenoscopy N/A 10/17/2012    Procedure: ESOPHAGOGASTRODUODENOSCOPY (EGD);  Surgeon: Jon Gills, MD;  Location: [facility] OR;  Service: Gastroenterology;  Laterality: N/A;   Family History  Problem Relation Age of Onset  . Cholelithiasis Mother   . Hyperlipidemia Mother   . Hypertension Mother   . Mental illness Mother   . Cholelithiasis Maternal Uncle   . Hyperlipidemia Maternal Uncle   . Hypertension Maternal Uncle   . Cholelithiasis Maternal Grandmother   . Arthritis Maternal Grandmother   . COPD Maternal Grandmother   . Depression Maternal Grandmother   . Diabetes Maternal Grandmother   . Heart disease Maternal Grandmother   . Hyperlipidemia Maternal Grandmother   . Hypertension Maternal Grandmother   . Ulcers Neg Hx   . Alcohol abuse Neg Hx   . Asthma Neg Hx   . Birth defects Neg Hx   . Cancer Neg Hx   . Drug abuse Neg Hx   . Early death Neg Hx   . Hearing loss Neg Hx   . Kidney disease Neg Hx   . Mental  retardation Neg Hx   . Miscarriages / Stillbirths Neg Hx   . Stroke Neg Hx   . Vision loss Neg Hx   . Learning disabilities Paternal Grandmother   . Mental illness Paternal Grandmother    Social History  Substance Use Topics  . Smoking status: Never Smoker   . Smokeless tobacco: Never Used  . Alcohol Use: No   OB History    No data available     Review of Systems 10 Systems reviewed and are negative for acute change except as noted in the HPI.    Allergies  Tramadol  Home Medications   Prior to Admission medications   Medication Sig Start Date End Date Taking? Authorizing Provider  acetaminophen (TYLENOL) 500 MG tablet Take 1,000 mg by mouth every 6 (six) hours as needed for moderate pain.    Yes Historical Provider, MD  adalimumab (HUMIRA PEN) 40 MG/0.8ML injection Inject 40 mg into the skin every 14 (fourteen) days.   Yes Historical Provider, MD  ibuprofen (ADVIL,MOTRIN) 200 MG tablet Take 800 mg by mouth every 6 (six) hours as needed for moderate pain.   Yes Historical Provider, MD  norethindrone (MICRONOR,CAMILA,ERRIN) 0.35 MG tablet Take 1 tablet by mouth daily. 03/13/15  Yes Historical Provider, MD  omeprazole (PRILOSEC) 40 MG capsule Take 40 mg by mouth daily. 05/10/15  Yes Historical Provider, MD  ibuprofen (ADVIL,MOTRIN) 800 MG  tablet Take 1 tablet (800 mg total) by mouth 3 (three) times daily. 05/28/15   Arby Barrette, MD   BP 120/69 mmHg  Pulse 61  Temp(Src) 98.7 F (37.1 C) (Oral)  Resp 16  SpO2 100%  LMP 05/27/2015 Physical Exam  Constitutional: She is oriented to person, place, and time. She appears well-developed and well-nourished.  Patient is well in appearance. She is interacting pleasantly with her mother who is in the room.  HENT:  Head: Normocephalic and atraumatic.  Eyes: EOM are normal. Pupils are equal, round, and reactive to light.  Neck: Neck supple.  Cardiovascular: Normal rate, regular rhythm, normal heart sounds and intact distal pulses.    Pulmonary/Chest: Effort normal and breath sounds normal.  Abdominal: Soft. Bowel sounds are normal. She exhibits no distension. There is tenderness.  Patient endorses diffuse lower tenderness. No localizing. No guarding.  Genitourinary:  Pelvic exam: Normal extraocular female genitalia. Speculum examination small amount of blood in the vaginal vault. Cervix is not friable clear mucous discharge blood-tinged. No cervical motion tenderness. No significant adnexal tenderness to palpation.  Musculoskeletal: Normal range of motion. She exhibits no edema or tenderness.  Neurological: She is alert and oriented to person, place, and time. She has normal strength. No cranial nerve deficit. She exhibits normal muscle tone. Coordination normal. GCS eye subscore is 4. GCS verbal subscore is 5. GCS motor subscore is 6.  Skin: Skin is warm, dry and intact.  Psychiatric: She has a normal mood and affect.    ED Course  Procedures (including critical care time) Labs Review Labs Reviewed  WET PREP, GENITAL - Abnormal; Notable for the following:    WBC, Wet Prep HPF POC FEW (*)    All other components within normal limits  LIPASE, BLOOD - Abnormal; Notable for the following:    Lipase 57 (*)    All other components within normal limits  COMPREHENSIVE METABOLIC PANEL - Abnormal; Notable for the following:    Calcium 8.7 (*)    All other components within normal limits  CBC - Abnormal; Notable for the following:    Hemoglobin 11.5 (*)    MCH 24.9 (*)    All other components within normal limits  URINALYSIS, ROUTINE W REFLEX MICROSCOPIC (NOT AT [facility]) - Abnormal; Notable for the following:    Color, Urine AMBER (*)    APPearance TURBID (*)    Hgb urine dipstick LARGE (*)    Protein, ur 30 (*)    Leukocytes, UA TRACE (*)    All other components within normal limits  URINE MICROSCOPIC-ADD ON - Abnormal; Notable for the following:    Squamous Epithelial / LPF FEW (*)    Bacteria, UA FEW (*)    All  other components within normal limits  POC URINE PREG, ED  GC/CHLAMYDIA PROBE AMP ([place]) NOT AT [facility]    Imaging Review No results found. I have personally reviewed and evaluated these images and lab results as part of my medical decision-making.   EKG Interpretation None      MDM   Final diagnoses:  Pelvic pain in female   Patient has had lower abdominal pain that is diffuse. This is not localized and is not suggestive of appendicitis. Patient is currently having her menstrual cycle. At this time I feel pain is most likely related to menstrual cramping. Patient test is negative. Pelvic examination does not show cervical friability or discharge. Patient does not believe she has significant risk of STD exposure. Gonorrhea chlamydia  cultures will be pending. Reviewed EMR does show patient has had recurrent episodes of lower abdominal\Pelvic pain at this time this does not appear to be surgical etiology. She has had several previous CT scans as well as pelvic ultrasounds without positive findings. The patient will be given ibuprofen to take for pain and advised to follow-up with her gynecologist.    Arby Barrette, MD 05/28/15 [phone]

## 2015-05-28 NOTE — ED Notes (Signed)
Pt called out and asked for something for pain.

## 2015-05-28 NOTE — ED Notes (Signed)
Pt. reports low abdominal pain with nausea and vomitting onset yesterday , denies diarrhea , no fever or chills.

## 2015-05-30 LAB — GC/CHLAMYDIA PROBE AMP (~~LOC~~) NOT AT ARMC
CHLAMYDIA, DNA PROBE: NEGATIVE
NEISSERIA GONORRHEA: NEGATIVE

## 2015-08-15 ENCOUNTER — Emergency Department (HOSPITAL_COMMUNITY)
Admission: EM | Admit: 2015-08-15 | Discharge: 2015-08-16 | Disposition: A | Discharge status: Home / Self Care | Payer: 59 | Attending: Emergency Medicine | Admitting: Emergency Medicine

## 2015-08-15 ENCOUNTER — Encounter (HOSPITAL_COMMUNITY): Payer: Self-pay | Admitting: Nurse Practitioner

## 2015-08-15 DIAGNOSIS — M546 Pain in thoracic spine: Secondary | ICD-10-CM | POA: Diagnosis not present

## 2015-08-15 DIAGNOSIS — Z8679 Personal history of other diseases of the circulatory system: Secondary | ICD-10-CM | POA: Insufficient documentation

## 2015-08-15 DIAGNOSIS — Z793 Long term (current) use of hormonal contraceptives: Secondary | ICD-10-CM | POA: Insufficient documentation

## 2015-08-15 DIAGNOSIS — Z8669 Personal history of other diseases of the nervous system and sense organs: Secondary | ICD-10-CM | POA: Diagnosis not present

## 2015-08-15 DIAGNOSIS — Z79899 Other long term (current) drug therapy: Secondary | ICD-10-CM | POA: Diagnosis not present

## 2015-08-15 DIAGNOSIS — Z791 Long term (current) use of non-steroidal anti-inflammatories (NSAID): Secondary | ICD-10-CM | POA: Diagnosis not present

## 2015-08-15 DIAGNOSIS — Z8719 Personal history of other diseases of the digestive system: Secondary | ICD-10-CM | POA: Diagnosis not present

## 2015-08-15 DIAGNOSIS — Z8659 Personal history of other mental and behavioral disorders: Secondary | ICD-10-CM | POA: Diagnosis not present

## 2015-08-15 DIAGNOSIS — Z872 Personal history of diseases of the skin and subcutaneous tissue: Secondary | ICD-10-CM | POA: Diagnosis not present

## 2015-08-15 DIAGNOSIS — M545 Low back pain: Secondary | ICD-10-CM | POA: Insufficient documentation

## 2015-08-15 DIAGNOSIS — M5489 Other dorsalgia: Secondary | ICD-10-CM

## 2015-08-15 MED ORDER — PREDNISONE 20 MG PO TABS: 40.0000 mg | ORAL_TABLET | Freq: Every day | ORAL | Status: DC

## 2015-08-15 MED ORDER — NAPROXEN 500 MG PO TABS: 500.0000 mg | ORAL_TABLET | Freq: Two times a day (BID) | ORAL | Status: DC

## 2015-08-15 MED ORDER — CYCLOBENZAPRINE HCL 10 MG PO TABS: 10.0000 mg | ORAL_TABLET | Freq: Three times a day (TID) | ORAL | Status: DC | PRN

## 2015-08-15 NOTE — ED Notes (Signed)
Patient complains of back pain that started two weeks ago and has worsened. She states it interferes with her ability to move normally, wakes her up from her sleep, and prevents her from falling asleep. She has tried tylenol, ibuprofen, and ice without improvement. She has not tried any exercise or physical therapy for her back pain. She has had back pain in the past and it resolved on its own but feels this time is worse and she seeks medical intervention at this time.

## 2015-08-15 NOTE — ED Provider Notes (Signed)
CSN: [id number]     Arrival date & time 08/15/15  2217 History  By signing my name below, I, Arianna Nassar, attest that this documentation has been prepared under the direction and in the presence of Gilda Crease, MD. Electronically Signed: Octavia Heir, ED Scribe. 08/15/2015. 11:40 PM.      Chief Complaint  Patient presents with  . Back Pain      The history is provided by the patient. No language interpreter was used.   HPI Comments: Traci Castro is a 19 y.o. female who presents to the Emergency Department complaining of constant, moderate, gradual worsening, lower back pain onset two weeks ago. Pt reports difficulty bending and squatting without severe pain. Pt reports her back is tender to touch due to pain. The pain does not radiate. She notes having back pain in the past but she states it has never lasted this long. Pt has tried Tylenol and Ibuprofen to alleviate the pain with no relief. She denies any injury to her back.  Past Medical History  Diagnosis Date  . Psoriasis   . Gastritis     2 bouts  . Vision abnormalities     wears glasses  . Migraine   . Anxiety   . Depression    Past Surgical History  Procedure Laterality Date  . Esophagogastroduodenoscopy N/A 10/17/2012    Procedure: ESOPHAGOGASTRODUODENOSCOPY (EGD);  Surgeon: Jon Gills, MD;  Location: [facility] OR;  Service: Gastroenterology;  Laterality: N/A;   Family History  Problem Relation Age of Onset  . Cholelithiasis Mother   . Hyperlipidemia Mother   . Hypertension Mother   . Mental illness Mother   . Cholelithiasis Maternal Uncle   . Hyperlipidemia Maternal Uncle   . Hypertension Maternal Uncle   . Cholelithiasis Maternal Grandmother   . Arthritis Maternal Grandmother   . COPD Maternal Grandmother   . Depression Maternal Grandmother   . Diabetes Maternal Grandmother   . Heart disease Maternal Grandmother   . Hyperlipidemia Maternal Grandmother   . Hypertension Maternal Grandmother   .  Ulcers Neg Hx   . Alcohol abuse Neg Hx   . Asthma Neg Hx   . Birth defects Neg Hx   . Cancer Neg Hx   . Drug abuse Neg Hx   . Early death Neg Hx   . Hearing loss Neg Hx   . Kidney disease Neg Hx   . Mental retardation Neg Hx   . Miscarriages / Stillbirths Neg Hx   . Stroke Neg Hx   . Vision loss Neg Hx   . Learning disabilities Paternal Grandmother   . Mental illness Paternal Grandmother    Social History  Substance Use Topics  . Smoking status: Never Smoker   . Smokeless tobacco: Never Used  . Alcohol Use: No   OB History    No data available     Review of Systems  Musculoskeletal: Positive for back pain.  All other systems reviewed and are negative.     Allergies  Tramadol  Home Medications   Prior to Admission medications   Medication Sig Start Date End Date Taking? Authorizing Provider  acetaminophen (TYLENOL) 500 MG tablet Take 1,000 mg by mouth every 6 (six) hours as needed for moderate pain.    Yes Historical Provider, MD  adalimumab (HUMIRA PEN) 40 MG/0.8ML injection Inject 40 mg into the skin every 14 (fourteen) days.   Yes Historical Provider, MD  ibuprofen (ADVIL,MOTRIN) 200 MG tablet Take 800 mg by mouth every  6 (six) hours as needed for moderate pain.   Yes Historical Provider, MD  norethindrone (MICRONOR,CAMILA,ERRIN) 0.35 MG tablet Take 1 tablet by mouth daily. 03/13/15  Yes Historical Provider, MD  omeprazole (PRILOSEC) 40 MG capsule Take 40 mg by mouth daily. 05/10/15  Yes Historical Provider, MD  cyclobenzaprine (FLEXERIL) 10 MG tablet Take 1 tablet (10 mg total) by mouth 3 (three) times daily as needed for muscle spasms. 08/15/15   Gilda Creasehristopher J Pollina, MD  ibuprofen (ADVIL,MOTRIN) 800 MG tablet Take 1 tablet (800 mg total) by mouth 3 (three) times daily. 05/28/15   Arby BarretteMarcy Pfeiffer, MD  naproxen (NAPROSYN) 500 MG tablet Take 1 tablet (500 mg total) by mouth 2 (two) times daily. 08/15/15   Gilda Creasehristopher J Pollina, MD  predniSONE (DELTASONE) 20 MG tablet  Take 2 tablets (40 mg total) by mouth daily with breakfast. 08/15/15   Gilda Creasehristopher J Pollina, MD   Triage vitals: BP 138/87 mmHg  Pulse 88  Temp(Src) 98.3 F (36.8 C) (Oral)  Resp 18  Ht 5\' 3"  (1.6 m)  Wt 282 lb (127.914 kg)  BMI 49.97 kg/m2  SpO2 100%  LMP 07/23/2015 (Exact Date) Physical Exam  Constitutional: She is oriented to person, place, and time. She appears well-developed and well-nourished. No distress.  HENT:  Head: Normocephalic and atraumatic.  Right Ear: Hearing normal.  Left Ear: Hearing normal.  Nose: Nose normal.  Mouth/Throat: Oropharynx is clear and moist and mucous membranes are normal.  Eyes: Conjunctivae and EOM are normal. Pupils are equal, round, and reactive to light.  Neck: Normal range of motion. Neck supple.  Cardiovascular: Regular rhythm, S1 normal and S2 normal.  Exam reveals no gallop and no friction rub.   No murmur heard. Pulmonary/Chest: Effort normal and breath sounds normal. No respiratory distress. She exhibits no tenderness.  Abdominal: Soft. Normal appearance and bowel sounds are normal. There is no hepatosplenomegaly. There is no tenderness. There is no rebound, no guarding, no tenderness at McBurney's point and negative Murphy's sign. No hernia.  Musculoskeletal: Normal range of motion. She exhibits tenderness.  Diffuse thoracic and lumbar perispinal tenderness and spasms  Neurological: She is alert and oriented to person, place, and time. She has normal strength. No cranial nerve deficit or sensory deficit. Coordination normal. GCS eye subscore is 4. GCS verbal subscore is 5. GCS motor subscore is 6.  Skin: Skin is warm, dry and intact. No rash noted. No cyanosis.  Psychiatric: She has a normal mood and affect. Her speech is normal and behavior is normal. Thought content normal.  Nursing note and vitals reviewed.   ED Course  Procedures  DIAGNOSTIC STUDIES: Oxygen Saturation is 100% on RA, normal by my interpretation.  COORDINATION OF  CARE:  11:29 PM Discussed treatment plan with pt at bedside and pt agreed to plan.  Labs Review Labs Reviewed - No data to display  Imaging Review No results found. I have personally reviewed and evaluated these images and lab results as part of my medical decision-making.   EKG Interpretation None      MDM   Final diagnoses:  Other back pain    Patient presents to the ER with musculoskeletal back pain. Examination reveals back tenderness without any associated neurologic findings. Patient's strength, sensation and reflexes were normal. As such, patient did not require any imaging or further studies. Patient was treated with analgesia.  I personally performed the services described in this documentation, which was scribed in my presence. The recorded information has been reviewed and  is accurate.     Gilda Crease, MD 08/15/15 2340

## 2015-08-15 NOTE — Discharge Instructions (Signed)

## 2015-08-28 NOTE — L&D Delivery Note (Signed)
20 y/o G1P0 who spresented with SROM. Was augmented with pitocin.   Delivery Note At  a viable Female was delivered via  (Presentation: cephalic ROA ).  APGAR: 8  ,and 9 ; weight 8 lb 5 oz (3771 g).   Placenta status: delivered intact with gentle traction.  Cord:  3 vessels with the no complications:  Anesthesia:  Local lidocaine for repair Episiotomy:   none Lacerations:   1st degree b/l paraurethral Suture Repair: 3.0 vicryl to repair 1st degree which was bleeding Est. Blood Loss (mL):   100  Mom to postpartum.  Baby to Couplet care / Skin to Skin.  Traci Castro 05/05/2016, 11:10 AM

## 2015-09-26 ENCOUNTER — Inpatient Hospital Stay (HOSPITAL_COMMUNITY): Payer: 59

## 2015-09-26 ENCOUNTER — Inpatient Hospital Stay (HOSPITAL_COMMUNITY)
Admission: AD | Admit: 2015-09-26 | Discharge: 2015-09-26 | Disposition: A | Discharge status: Home / Self Care | Payer: 59 | Source: Ambulatory Visit | Attending: Obstetrics and Gynecology | Admitting: Obstetrics and Gynecology

## 2015-09-26 ENCOUNTER — Encounter (HOSPITAL_COMMUNITY): Payer: Self-pay | Admitting: *Deleted

## 2015-09-26 DIAGNOSIS — O43891 Other placental disorders, first trimester: Secondary | ICD-10-CM

## 2015-09-26 DIAGNOSIS — O208 Other hemorrhage in early pregnancy: Secondary | ICD-10-CM | POA: Diagnosis not present

## 2015-09-26 DIAGNOSIS — R109 Unspecified abdominal pain: Secondary | ICD-10-CM | POA: Insufficient documentation

## 2015-09-26 DIAGNOSIS — O26899 Other specified pregnancy related conditions, unspecified trimester: Secondary | ICD-10-CM

## 2015-09-26 DIAGNOSIS — O9989 Other specified diseases and conditions complicating pregnancy, childbirth and the puerperium: Secondary | ICD-10-CM

## 2015-09-26 DIAGNOSIS — Z3A01 Less than 8 weeks gestation of pregnancy: Secondary | ICD-10-CM | POA: Insufficient documentation

## 2015-09-26 DIAGNOSIS — O418X10 Other specified disorders of amniotic fluid and membranes, first trimester, not applicable or unspecified: Secondary | ICD-10-CM

## 2015-09-26 DIAGNOSIS — O468X1 Other antepartum hemorrhage, first trimester: Secondary | ICD-10-CM

## 2015-09-26 LAB — URINALYSIS, ROUTINE W REFLEX MICROSCOPIC
BILIRUBIN URINE: NEGATIVE
Glucose, UA: NEGATIVE mg/dL
Hgb urine dipstick: NEGATIVE
Ketones, ur: NEGATIVE mg/dL
Leukocytes, UA: NEGATIVE
NITRITE: NEGATIVE
Protein, ur: NEGATIVE mg/dL
SPECIFIC GRAVITY, URINE: 1.02 (ref 1.005–1.030)
pH: 5.5 (ref 5.0–8.0)

## 2015-09-26 LAB — CBC
HEMATOCRIT: 36.8 % (ref 36.0–46.0)
Hemoglobin: 11.8 g/dL — ABNORMAL LOW (ref 12.0–15.0)
MCH: 25.2 pg — ABNORMAL LOW (ref 26.0–34.0)
MCHC: 32.1 g/dL (ref 30.0–36.0)
MCV: 78.5 fL (ref 78.0–100.0)
PLATELETS: 305 10*3/uL (ref 150–400)
RBC: 4.69 MIL/uL (ref 3.87–5.11)
RDW: 14.9 % (ref 11.5–15.5)
WBC: 11.6 10*3/uL — AB (ref 4.0–10.5)

## 2015-09-26 LAB — HCG, QUANTITATIVE, PREGNANCY: HCG, BETA CHAIN, QUANT, S: 14465 m[IU]/mL — AB (ref <5)

## 2015-09-26 LAB — POCT PREGNANCY, URINE: PREG TEST UR: POSITIVE — AB

## 2015-09-26 LAB — ABO/RH: ABO/RH(D): A POS

## 2015-09-26 NOTE — MAU Note (Signed)
Pain in lower abd,  Has been going on since she left the dr's office on the 26th.  Brings her to her knees.  Called the office was told to come here.

## 2015-09-26 NOTE — Discharge Instructions (Signed)

## 2015-09-26 NOTE — MAU Provider Note (Signed)
History     CSN: [id number]  Arrival date and time: 09/26/15 1724   First Provider Initiated Contact with Patient 09/26/15 1834         Chief Complaint  Patient presents with  . Abdominal Pain   HPI Traci Castro is a 20 y.o. G1P0 at [redacted]w[redacted]d who presents for abdominal pain.  Abdominal pain since last Thursday. Comes & goes in suprapubic area. Describes as stabbing pain that she rates as 7/10. Has been taking tylenol with no relief. Denies aggravating or alleviating symptoms.  Reports some nausea & vomiting during pregnancy. Has vomited once today. Denies nausea at this time.  Denies diarrhea, constipation, vaginal bleeding, vaginal discharge, fever, or urinary complaints.   OB History    Gravida Para Term Preterm AB TAB SAB Ectopic Multiple Living   1               Past Medical History  Diagnosis Date  . Psoriasis   . Gastritis     2 bouts  . Vision abnormalities     wears glasses  . Migraine   . Anxiety   . Depression     Past Surgical History  Procedure Laterality Date  . Esophagogastroduodenoscopy N/A 10/17/2012    Procedure: ESOPHAGOGASTRODUODENOSCOPY (EGD);  Surgeon: Jon Gills, MD;  Location: [facility] OR;  Service: Gastroenterology;  Laterality: N/A;    Family History  Problem Relation Age of Onset  . Cholelithiasis Mother   . Hyperlipidemia Mother   . Hypertension Mother   . Mental illness Mother   . Cholelithiasis Maternal Uncle   . Hyperlipidemia Maternal Uncle   . Hypertension Maternal Uncle   . Cholelithiasis Maternal Grandmother   . Arthritis Maternal Grandmother   . COPD Maternal Grandmother   . Depression Maternal Grandmother   . Diabetes Maternal Grandmother   . Heart disease Maternal Grandmother   . Hyperlipidemia Maternal Grandmother   . Hypertension Maternal Grandmother   . Ulcers Neg Hx   . Alcohol abuse Neg Hx   . Asthma Neg Hx   . Birth defects Neg Hx   . Cancer Neg Hx   . Drug abuse Neg Hx   . Early death Neg Hx   . Hearing loss Neg  Hx   . Kidney disease Neg Hx   . Mental retardation Neg Hx   . Miscarriages / Stillbirths Neg Hx   . Stroke Neg Hx   . Vision loss Neg Hx   . Learning disabilities Paternal Grandmother   . Mental illness Paternal Grandmother     Social History  Substance Use Topics  . Smoking status: Never Smoker   . Smokeless tobacco: Never Used  . Alcohol Use: No    Allergies:  Allergies  Allergen Reactions  . Tramadol Nausea And Vomiting    Prescriptions prior to admission  Medication Sig Dispense Refill Last Dose  . acetaminophen (TYLENOL) 500 MG tablet Take 1,000 mg by mouth every 6 (six) hours as needed for moderate pain.    09/25/2015 at Unknown time  . ibuprofen (ADVIL,MOTRIN) 800 MG tablet Take 1 tablet (800 mg total) by mouth 3 (three) times daily. (Patient taking differently: Take 800 mg by mouth every 8 (eight) hours as needed for moderate pain. ) 21 tablet 0 Past Month at Unknown time  . omeprazole (PRILOSEC) 40 MG capsule Take 40 mg by mouth daily.  3 Past Month at Unknown time  . adalimumab (HUMIRA PEN) 40 MG/0.8ML injection Inject 40 mg into the skin every 14 (  fourteen) days.   06/29/2015  . cyclobenzaprine (FLEXERIL) 10 MG tablet Take 1 tablet (10 mg total) by mouth 3 (three) times daily as needed for muscle spasms. (Patient not taking: Reported on 09/26/2015) 20 tablet 0   . naproxen (NAPROSYN) 500 MG tablet Take 1 tablet (500 mg total) by mouth 2 (two) times daily. (Patient not taking: Reported on 09/26/2015) 30 tablet 0   . predniSONE (DELTASONE) 20 MG tablet Take 2 tablets (40 mg total) by mouth daily with breakfast. (Patient not taking: Reported on 09/26/2015) 10 tablet 0     Review of Systems  Constitutional: Negative.   Gastrointestinal: Positive for nausea, vomiting and abdominal pain. Negative for diarrhea and constipation.  Genitourinary: Negative.    Physical Exam   Blood pressure 128/74, pulse 89, temperature 97.8 F (36.6 C), temperature source Oral, resp. rate 18,  height 5' 4.5" (1.638 m), weight 289 lb 3.2 oz (131.18 kg), last menstrual period 08/17/2015.  Physical Exam  Nursing note and vitals reviewed. Constitutional: She is oriented to person, place, and time. She appears well-developed and well-nourished. No distress.  HENT:  Head: Normocephalic and atraumatic.  Eyes: Conjunctivae are normal. Right eye exhibits no discharge. Left eye exhibits no discharge. No scleral icterus.  Neck: Normal range of motion.  Cardiovascular: Normal rate, regular rhythm and normal heart sounds.   No murmur heard. Respiratory: Effort normal and breath sounds normal. No respiratory distress. She has no wheezes.  GI: Soft. Bowel sounds are normal. She exhibits no distension. There is no tenderness. There is no rebound.  Neurological: She is alert and oriented to person, place, and time.  Skin: Skin is warm and dry. She is not diaphoretic.  Psychiatric: She has a normal mood and affect. Her behavior is normal. Judgment and thought content normal.    MAU Course  Procedures Results for orders placed or performed during the hospital encounter of 09/26/15 (from the past 24 hour(s))  Urinalysis, Routine w reflex microscopic (not at [facility])     Status: None   Collection Time: 09/26/15  5:45 PM  Result Value Ref Range   Color, Urine YELLOW YELLOW   APPearance CLEAR CLEAR   Specific Gravity, Urine 1.020 1.005 - 1.030   pH 5.5 5.0 - 8.0   Glucose, UA NEGATIVE NEGATIVE mg/dL   Hgb urine dipstick NEGATIVE NEGATIVE   Bilirubin Urine NEGATIVE NEGATIVE   Ketones, ur NEGATIVE NEGATIVE mg/dL   Protein, ur NEGATIVE NEGATIVE mg/dL   Nitrite NEGATIVE NEGATIVE   Leukocytes, UA NEGATIVE NEGATIVE  Pregnancy, urine POC     Status: Abnormal   Collection Time: 09/26/15  6:11 PM  Result Value Ref Range   Preg Test, Ur POSITIVE (A) NEGATIVE  CBC     Status: Abnormal   Collection Time: 09/26/15  6:30 PM  Result Value Ref Range   WBC 11.6 (H) 4.0 - 10.5 K/uL   RBC 4.69 3.87 - 5.11  MIL/uL   Hemoglobin 11.8 (L) 12.0 - 15.0 g/dL   HCT 16.1 09.6 - 04.5 %   MCV 78.5 78.0 - 100.0 fL   MCH 25.2 (L) 26.0 - 34.0 pg   MCHC 32.1 30.0 - 36.0 g/dL   RDW 40.9 81.1 - 91.4 %   Platelets 305 150 - 400 K/uL  ABO/Rh     Status: None   Collection Time: 09/26/15  6:30 PM  Result Value Ref Range   ABO/RH(D) A POS   hCG, quantitative, pregnancy     Status: Abnormal   Collection  Time: 09/26/15  6:30 PM  Result Value Ref Range   hCG, Beta Chain, Quant, S 14465 (H) <5 mIU/mL   US Ob Comp Less 14 Wks  09/26/2015  CLINICAL DATA:  Lower abdominal pain for 4 days. EXAM: OBSTETRIC <14 WK [place] AND TRANSVAGINAL OB US TECHNIQUE: Both transabdominal and transvaginal ultrasound examinations were performed for complete evaluation of the gestation as well as the maternal uterus, adnexal regions, and pelvic cul-de-sac. Transvaginal technique was performed to assess early pregnancy. COMPARISON:  None. FINDINGS: Intrauterine gestational sac: Single Yolk sac:  Yes Embryo:  No Cardiac Activity: No MSD: 12  mm   6 w   0  d Subchorionic hemorrhage:  Small. Maternal uterus/adnexae: 2.6 cm cyst on the left ovary. Otherwise normal. IMPRESSION: Intrauterine pregnancy. There is a gestational sac and yolk sac but there is no visible embryo at this time. Small subchorionic hemorrhage. Electronically Signed   By: Francene Boyers M.D.   On: 09/26/2015 19:44   US Ob Transvaginal  09/26/2015  CLINICAL DATA:  Lower abdominal pain for 4 days. EXAM: OBSTETRIC <14 WK [place] AND TRANSVAGINAL OB US TECHNIQUE: Both transabdominal and transvaginal ultrasound examinations were performed for complete evaluation of the gestation as well as the maternal uterus, adnexal regions, and pelvic cul-de-sac. Transvaginal technique was performed to assess early pregnancy. COMPARISON:  None. FINDINGS: Intrauterine gestational sac: Single Yolk sac:  Yes Embryo:  No Cardiac Activity: No MSD: 12  mm   6 w   0  d Subchorionic hemorrhage:  Small. Maternal  uterus/adnexae: 2.6 cm cyst on the left ovary. Otherwise normal. IMPRESSION: Intrauterine pregnancy. There is a gestational sac and yolk sac but there is no visible embryo at this time. Small subchorionic hemorrhage. Electronically Signed   By: Francene Boyers M.D.   On: 09/26/2015 19:44     MDM UPT positive A positive IUGS with yolk sac & small SCH seen per ultrasound Patient in no apparent distress.  S/w Dr. Dion Body about patient; came to MAU to speak with patient.  Assessment and Plan  A: 1. Subchorionic hematoma in first trimester   2. Abdominal pain in pregnancy     P: Discharge home F/u with ob as scheduled Discussed reasons to return Take tylenol prn pain per bottle instructions  Judeth Horn, NP   09/26/2015, 6:33 PM

## 2015-10-06 LAB — OB RESULTS CONSOLE HEPATITIS B SURFACE ANTIGEN: Hepatitis B Surface Ag: NEGATIVE

## 2015-10-06 LAB — OB RESULTS CONSOLE HGB/HCT, BLOOD
HEMATOCRIT: 36 %
Hemoglobin: 11.6 g/dL

## 2015-10-06 LAB — OB RESULTS CONSOLE PLATELET COUNT: PLATELETS: 335 10*3/uL

## 2015-10-06 LAB — OB RESULTS CONSOLE RUBELLA ANTIBODY, IGM: Rubella: IMMUNE

## 2015-10-06 LAB — OB RESULTS CONSOLE ABO/RH: RH Type: POSITIVE

## 2015-10-06 LAB — OB RESULTS CONSOLE ANTIBODY SCREEN: Antibody Screen: NEGATIVE

## 2015-10-06 LAB — OB RESULTS CONSOLE HIV ANTIBODY (ROUTINE TESTING): HIV: NONREACTIVE

## 2015-10-06 LAB — OB RESULTS CONSOLE RPR: RPR: NONREACTIVE

## 2015-10-16 ENCOUNTER — Telehealth: Payer: Self-pay

## 2015-10-16 NOTE — Telephone Encounter (Signed)
Patient call reports bilateral lower abdomen pain that started earlier today.  Patient states it is intermittent in nature and describes it as an aching pain.  Patient reports trying 1/2 of a  tylenol, around 12pm, with no relief of symptoms.  Patient further reports consumption of two bottles of water today.  Patient denies bleeding and issues with urination.  Reassurances given.  Patient instructed to take 2 XR tylenol and start to consume water.  Patient educated on proper hydration during pregnancy and round ligament pain.  Patient encouraged to call back if symptoms worsen or other issues arise.  Patient verbalized understanding.  JE, CNM

## 2015-10-27 LAB — OB RESULTS CONSOLE GC/CHLAMYDIA
CHLAMYDIA, DNA PROBE: NEGATIVE
GC PROBE AMP, GENITAL: NEGATIVE

## 2015-10-31 ENCOUNTER — Other Ambulatory Visit (HOSPITAL_COMMUNITY): Payer: Self-pay | Admitting: Obstetrics & Gynecology

## 2015-10-31 DIAGNOSIS — O26851 Spotting complicating pregnancy, first trimester: Secondary | ICD-10-CM

## 2015-11-04 ENCOUNTER — Ambulatory Visit (HOSPITAL_COMMUNITY)
Admission: RE | Admit: 2015-11-04 | Discharge: 2015-11-04 | Disposition: A | Discharge status: Home / Self Care | Payer: Medicaid Other | Source: Ambulatory Visit | Attending: Obstetrics & Gynecology | Admitting: Obstetrics & Gynecology

## 2015-11-04 DIAGNOSIS — O3481 Maternal care for other abnormalities of pelvic organs, first trimester: Secondary | ICD-10-CM | POA: Insufficient documentation

## 2015-11-04 DIAGNOSIS — Z36 Encounter for antenatal screening of mother: Secondary | ICD-10-CM | POA: Insufficient documentation

## 2015-11-04 DIAGNOSIS — Z3A11 11 weeks gestation of pregnancy: Secondary | ICD-10-CM | POA: Diagnosis not present

## 2015-11-04 DIAGNOSIS — N83292 Other ovarian cyst, left side: Secondary | ICD-10-CM | POA: Diagnosis not present

## 2015-11-04 DIAGNOSIS — O26851 Spotting complicating pregnancy, first trimester: Secondary | ICD-10-CM

## 2015-11-13 ENCOUNTER — Encounter (HOSPITAL_COMMUNITY): Payer: Self-pay | Admitting: *Deleted

## 2015-11-13 ENCOUNTER — Inpatient Hospital Stay (HOSPITAL_COMMUNITY)
Admission: AD | Admit: 2015-11-13 | Discharge: 2015-11-14 | Disposition: A | Discharge status: Home / Self Care | Payer: 59 | Source: Ambulatory Visit | Attending: Obstetrics & Gynecology | Admitting: Obstetrics & Gynecology

## 2015-11-13 DIAGNOSIS — O26891 Other specified pregnancy related conditions, first trimester: Secondary | ICD-10-CM | POA: Insufficient documentation

## 2015-11-13 DIAGNOSIS — Z8249 Family history of ischemic heart disease and other diseases of the circulatory system: Secondary | ICD-10-CM | POA: Diagnosis not present

## 2015-11-13 DIAGNOSIS — O219 Vomiting of pregnancy, unspecified: Secondary | ICD-10-CM | POA: Diagnosis not present

## 2015-11-13 DIAGNOSIS — R1030 Lower abdominal pain, unspecified: Secondary | ICD-10-CM | POA: Insufficient documentation

## 2015-11-13 DIAGNOSIS — Z3A12 12 weeks gestation of pregnancy: Secondary | ICD-10-CM | POA: Insufficient documentation

## 2015-11-13 DIAGNOSIS — Z888 Allergy status to other drugs, medicaments and biological substances status: Secondary | ICD-10-CM | POA: Insufficient documentation

## 2015-11-13 DIAGNOSIS — R112 Nausea with vomiting, unspecified: Secondary | ICD-10-CM | POA: Diagnosis present

## 2015-11-13 LAB — URINALYSIS, ROUTINE W REFLEX MICROSCOPIC
BILIRUBIN URINE: NEGATIVE
Glucose, UA: NEGATIVE mg/dL
Ketones, ur: 15 mg/dL — AB
Leukocytes, UA: NEGATIVE
NITRITE: NEGATIVE
PROTEIN: NEGATIVE mg/dL
Specific Gravity, Urine: 1.025 (ref 1.005–1.030)
pH: 6 (ref 5.0–8.0)

## 2015-11-13 LAB — URINE MICROSCOPIC-ADD ON: WBC UA: NONE SEEN WBC/hpf (ref 0–5)

## 2015-11-13 MED ORDER — METOCLOPRAMIDE HCL 10 MG PO TABS
10.0000 mg | ORAL_TABLET | Freq: Once | ORAL | Status: AC
  Administered 2015-11-13: 10 mg via ORAL
  Filled 2015-11-13: qty 1

## 2015-11-13 NOTE — MAU Note (Signed)
Pt presents complaining of lower abdominal pain and nausea. Takes zofran and phenergan. Last zofran right before she got here. Denies vaginal bleeding. Some brown discharge.

## 2015-11-13 NOTE — MAU Provider Note (Signed)
History   [id number]   Chief Complaint  Patient presents with  . Nausea  . Emesis  . Abdominal Pain    HPI Traci Castro is a 20 y.o. female  G1P0 at [redacted]w[redacted]d IUP here with report of lower abdominal pain when vomiting and nausea and vomiting.  Report vomiting approximately 5x today.  Denies fever, body aches, or chills.  RX for both phenergan and Zofran at home.  Took Zofran prior to coming to MAU.  No vomiting since taking Zofran.  Denies vaginal bleeding.  Patient's last menstrual period was 08/17/2015.  OB History  Gravida Para Term Preterm AB SAB TAB Ectopic Multiple Living  1             # Outcome Date GA Lbr Len/2nd Weight Sex Delivery Anes PTL Lv  1 Current               Past Medical History  Diagnosis Date  . Psoriasis   . Gastritis     2 bouts  . Vision abnormalities     wears glasses  . Migraine   . Anxiety   . Depression     Family History  Problem Relation Age of Onset  . Cholelithiasis Mother   . Hyperlipidemia Mother   . Hypertension Mother   . Mental illness Mother   . Cholelithiasis Maternal Uncle   . Hyperlipidemia Maternal Uncle   . Hypertension Maternal Uncle   . Cholelithiasis Maternal Grandmother   . Arthritis Maternal Grandmother   . COPD Maternal Grandmother   . Depression Maternal Grandmother   . Diabetes Maternal Grandmother   . Heart disease Maternal Grandmother   . Hyperlipidemia Maternal Grandmother   . Hypertension Maternal Grandmother   . Ulcers Neg Hx   . Alcohol abuse Neg Hx   . Asthma Neg Hx   . Birth defects Neg Hx   . Cancer Neg Hx   . Drug abuse Neg Hx   . Early death Neg Hx   . Hearing loss Neg Hx   . Kidney disease Neg Hx   . Mental retardation Neg Hx   . Miscarriages / Stillbirths Neg Hx   . Stroke Neg Hx   . Vision loss Neg Hx   . Learning disabilities Paternal Grandmother   . Mental illness Paternal Grandmother     Social History   Social History  . Marital Status: Single    Spouse Name: N/A  . Number of  Children: N/A  . Years of Education: N/A   Social History Main Topics  . Smoking status: Never Smoker   . Smokeless tobacco: Never Used  . Alcohol Use: No  . Drug Use: No  . Sexual Activity: Yes    Birth Control/ Protection: None   Other Topics Concern  . None   Social History Narrative   10th grade    Allergies  Allergen Reactions  . Tramadol Nausea And Vomiting    Current Facility-Administered Medications on File Prior to Encounter  Medication Dose Route Frequency Provider Last Rate Last Dose  . lactated ringers infusion   Intravenous Continuous Jon Gills, MD      . lidocaine-prilocaine (EMLA) cream 1 application  1 application Topical PRN Jon Gills, MD       Current Outpatient Prescriptions on File Prior to Encounter  Medication Sig Dispense Refill  . acetaminophen (TYLENOL) 500 MG tablet Take 1,000 mg by mouth every 6 (six) hours as needed for moderate pain.     Marland Kitchen  omeprazole (PRILOSEC) 40 MG capsule Take 40 mg by mouth daily.  3     Review of Systems  Constitutional: Negative for fever and chills.  Gastrointestinal: Positive for nausea, vomiting and abdominal pain.  Genitourinary: Negative for vaginal bleeding.  All other systems reviewed and are negative.    Physical Exam   Filed Vitals:   11/13/15 2202 11/13/15 2212  BP:  123/74  Pulse:  91  Temp:  98.5 F (36.9 C)  TempSrc:  Oral  Resp:  18  Height: 5\' 4"  (1.626 m)   Weight: 283 lb (128.368 kg)     Physical Exam  Constitutional: She is oriented to person, place, and time. She appears well-developed and well-nourished. No distress.  HENT:  Head: Normocephalic.  Mouth/Throat: Mucous membranes are normal. Mucous membranes are not dry.  Neck: Normal range of motion. Neck supple.  Cardiovascular: Normal rate, regular rhythm and normal heart sounds.   Respiratory: Effort normal and breath sounds normal.  GI: Soft. There is no tenderness.  Genitourinary: No bleeding in the vagina.   Neurological: She is alert and oriented to person, place, and time.  Skin: Skin is warm and dry.   FHT 152 MAU Course  Procedures  MDM Results for orders placed or performed during the hospital encounter of 11/13/15 (from the past 24 hour(s))  Urinalysis, Routine w reflex microscopic (not [facility])     Status: Abnormal   Collection Time: 11/13/15 10:00 PM  Result Value Ref Range   Color, Urine YELLOW YELLOW   APPearance CLEAR CLEAR   Specific Gravity, Urine 1.025 1.005 - 1.030   pH 6.0 5.0 - 8.0   Glucose, UA NEGATIVE NEGATIVE mg/dL   Hgb urine dipstick TRACE (A) NEGATIVE   Bilirubin Urine NEGATIVE NEGATIVE   Ketones, ur 15 (A) NEGATIVE mg/dL   Protein, ur NEGATIVE NEGATIVE mg/dL   Nitrite NEGATIVE NEGATIVE   Leukocytes, UA NEGATIVE NEGATIVE  Urine microscopic-add on     Status: Abnormal   Collection Time: 11/13/15 10:00 PM  Result Value Ref Range   Squamous Epithelial / LPF 6-30 (A) NONE SEEN   WBC, UA NONE SEEN 0 - 5 WBC/hpf   RBC / HPF 0-5 0 - 5 RBC/hpf   Bacteria, UA MANY (A) NONE SEEN   2345 Still has not vomiting in MAU; reports improved nausea since taking PO reglan 0005 Dr. Sallye OberKulwa notified regarding HPI/exam/assessment > discharge home with follow-up with Dr. Charlotta Newtonzan  Assessment and Plan  20 y.o. G1P0 at 3677w4d IUP  Nausea and Vomiting in Pregnancy - Stable  Plan: Discharge to home RX Reglan Follow-up with Dr. Joan Mayanszan Notify office for worsening or no improvement in symptoms  Marlis EdelsonWalidah N Karim, CNM 11/14/2015 12:08 AM     o

## 2015-11-14 DIAGNOSIS — O219 Vomiting of pregnancy, unspecified: Secondary | ICD-10-CM

## 2015-11-14 MED ORDER — METOCLOPRAMIDE HCL 10 MG PO TABS: 10.0000 mg | ORAL_TABLET | Freq: Three times a day (TID) | ORAL | Status: DC

## 2015-11-14 NOTE — Discharge Instructions (Signed)

## 2015-11-16 ENCOUNTER — Telehealth: Payer: Self-pay | Admitting: Obstetrics and Gynecology

## 2015-11-16 NOTE — Telephone Encounter (Signed)
TC from pt on 11/13/15 with c/o new onset of lower abdominal pain when vomiting. Vomiting persists despite meds. Has also tried home remedies w/o relief. Encouraged to come to MAU for a formal evaluation.

## 2016-01-29 ENCOUNTER — Encounter (HOSPITAL_COMMUNITY): Payer: Self-pay | Admitting: Emergency Medicine

## 2016-01-29 ENCOUNTER — Emergency Department (HOSPITAL_COMMUNITY)
Admission: EM | Admit: 2016-01-29 | Discharge: 2016-01-29 | Disposition: A | Discharge status: Home / Self Care | Payer: 59 | Attending: Emergency Medicine | Admitting: Emergency Medicine

## 2016-01-29 DIAGNOSIS — Z3A24 24 weeks gestation of pregnancy: Secondary | ICD-10-CM | POA: Insufficient documentation

## 2016-01-29 DIAGNOSIS — L509 Urticaria, unspecified: Secondary | ICD-10-CM | POA: Diagnosis not present

## 2016-01-29 DIAGNOSIS — F329 Major depressive disorder, single episode, unspecified: Secondary | ICD-10-CM | POA: Insufficient documentation

## 2016-01-29 DIAGNOSIS — R21 Rash and other nonspecific skin eruption: Secondary | ICD-10-CM | POA: Diagnosis present

## 2016-01-29 DIAGNOSIS — O99712 Diseases of the skin and subcutaneous tissue complicating pregnancy, second trimester: Secondary | ICD-10-CM | POA: Insufficient documentation

## 2016-01-29 DIAGNOSIS — O99342 Other mental disorders complicating pregnancy, second trimester: Secondary | ICD-10-CM | POA: Insufficient documentation

## 2016-01-29 MED ORDER — DIPHENHYDRAMINE HCL 12.5 MG/5ML PO SYRP: 25.0000 mg | ORAL_SOLUTION | Freq: Three times a day (TID) | ORAL | Status: DC

## 2016-01-29 MED ORDER — DIPHENHYDRAMINE HCL 50 MG/ML IJ SOLN
25.0000 mg | Freq: Once | INTRAMUSCULAR | Status: AC
  Administered 2016-01-29: 25 mg via INTRAMUSCULAR
  Filled 2016-01-29: qty 1

## 2016-01-29 MED ORDER — PREDNISOLONE 15 MG/5ML PO SOLN: 30.0000 mg | Freq: Every day | ORAL | Status: AC

## 2016-01-29 NOTE — ED Notes (Signed)
Pt c/o raised areas that may be insect bites. States she has not seen any bugs but is not sure. Pt is 6 months pregnant no issues in pregnancy.

## 2016-01-29 NOTE — ED Provider Notes (Signed)
CSN: [id number]     Arrival date & time 01/29/16  1742 History  By signing my name below, I, Traci Castro, attest that this documentation has been prepared under the direction and in the presence of [facility]aci [facility] Healthcare[facility]ily Layton Naves, PA-C. Electronically Signed: Tanda RockersMargaux Castro, ED Scribe. 01/29/2016. 7:03 PM.   Chief Complaint  Patient presents with  . Insect Bite   The history is provided by the patient. No language interpreter was used.    HPI Comments: Traci Castro is a 20 y.o. female who is approximately 6 months pregnant, presents to the Emergency Department complaining of multiple itchy, raised areas to BUEs, BLEs, lower back, and face that began yesterday, worsening today. There is no pain to the areas. She denies exposure to the outdoors or insects. No new medications, travel, foods, soaps, detergents, moisturizers, deodorant, or pets. No one else that patient has come into contact with has similar symptoms. Pt moved to a group home for pregnant women a couple of months ago. Denies abdominal pain, vaginal bleeding, fluid leakage, or any other associated symptoms. She is having normal movement of the baby.  No complications related to this pregnancy.  Past Medical History  Diagnosis Date  . Psoriasis   . Gastritis     2 bouts  . Vision abnormalities     wears glasses  . Migraine   . Anxiety   . Depression    Past Surgical History  Procedure Laterality Date  . Esophagogastroduodenoscopy N/A 10/17/2012    Procedure: ESOPHAGOGASTRODUODENOSCOPY (EGD);  Surgeon: Jon GillsJoseph H Clark, MD;  [facility];  Service: Gastroenterology;  Laterality: N/A;   Family History  Problem Relation Age of Onset  . Cholelithiasis Mother   . Hyperlipidemia Mother   . Hypertension Mother   . Mental illness Mother   . Cholelithiasis Maternal Uncle   . Hyperlipidemia Maternal Uncle   . Hypertension Maternal Uncle   . Cholelithiasis Maternal Grandmother   . Arthritis Maternal Grandmother   . COPD Maternal Grandmother   .  Depression Maternal Grandmother   . Diabetes Maternal Grandmother   . Heart disease Maternal Grandmother   . Hyperlipidemia Maternal Grandmother   . Hypertension Maternal Grandmother   . Ulcers Neg Hx   . Alcohol abuse Neg Hx   . Asthma Neg Hx   . Birth defects Neg Hx   . Cancer Neg Hx   . Drug abuse Neg Hx   . Early death Neg Hx   . Hearing loss Neg Hx   . Kidney disease Neg Hx   . Mental retardation Neg Hx   . Miscarriages / Stillbirths Neg Hx   . Stroke Neg Hx   . Vision loss Neg Hx   . Learning disabilities Paternal Grandmother   . Mental illness Paternal Grandmother    Social History  Substance Use Topics  . Smoking status: Never Smoker   . Smokeless tobacco: Never Used  . Alcohol Use: No   OB History    Gravida Para Term Preterm AB TAB SAB Ectopic Multiple Living   1              Review of Systems  Constitutional: Negative for fever.  HENT: Negative for facial swelling, sore throat and trouble swallowing.   Respiratory: Negative for shortness of breath, wheezing and stridor.   Gastrointestinal: Negative for abdominal pain.  Genitourinary: Negative for vaginal bleeding and pelvic pain.  Musculoskeletal: Negative for myalgias and arthralgias.  Skin: Positive for rash.       +  Raised areas to diffuse body  Allergic/Immunologic: Negative for immunocompromised state.  Psychiatric/Behavioral: Negative for self-injury.   Allergies  Tramadol  Home Medications   Prior to Admission medications   Medication Sig Start Date End Date Taking? Authorizing Provider  acetaminophen (TYLENOL) 500 MG tablet Take 1,000 mg by mouth every 6 (six) hours as needed for moderate pain.     Historical Provider, MD  metoCLOPramide (REGLAN) 10 MG tablet Take 1 tablet (10 mg total) by mouth 3 (three) times daily with meals. 11/14/15   Marlis Edelson, CNM  omeprazole (PRILOSEC) 40 MG capsule Take 40 mg by mouth daily. 05/10/15   Historical Provider, MD   BP 123/72 mmHg  Pulse 80   Temp(Src) 98.8 F (37.1 C) (Oral)  Resp 20  Ht  (1.626 m)  Wt 280 lb (127.007 kg)  BMI 48.04 kg/m2  SpO2 100%  LMP 08/17/2015   Physical Exam  Constitutional: She appears well-developed and well-nourished. No distress.  Pt talking normally, interactive, eating pretzels.    HENT:  Head: Normocephalic and atraumatic.  Mouth/Throat: Oropharynx is clear and moist. No oropharyngeal exudate.  No oral lesions  Eyes: Conjunctivae are normal.  Neck: Normal range of motion. Neck supple.  Cardiovascular: Normal rate.   Pulmonary/Chest: Effort normal.  Neurological: She is alert. She exhibits normal muscle tone.  Skin: She is not diaphoretic.  Scattered raised erythematous lesions.  See images below.  Bilateral arms with urticaria.  Left lower back with erythematous papules.  No other significant lesions.  No tenderness   Psychiatric: She has a normal mood and affect. Her behavior is normal.  Nursing note and vitals reviewed.         ED Course  Procedures (including critical care time)  DIAGNOSTIC STUDIES: Oxygen Saturation is 100% on RA, normal by my interpretation.    COORDINATION OF CARE: 7:01 PM-Discussed treatment plan with pt at bedside and pt agreed to plan.   Labs Review Labs Reviewed - No data to display  Imaging Review No results found. I have personally reviewed and evaluated these images and lab results as part of my medical decision-making.   EKG Interpretation None      MDM   Final diagnoses:  Hives   Afebrile, nontoxic patient with few scattered lesions over back and arms.  Right arm c/w urticaria (first picture).  Left lower back (in picture) appears more like insect bites.  3rd photo is left arm.  No respiratory symptoms.  No GI symptoms.  Discussed pt and reviewed images and medicines with Dr Freida Busman.  Pt states she was told she should never take steroids because of cysts on her ovaries - she will contact her OB office today to check.  D/C home  with benadryl, prednisone.  Discussed result, findings, treatment, and follow up  with patient.  Pt given return precautions.  Pt verbalizes understanding and agrees with plan.         I personally performed the services described in this documentation, which was scribed in my presence. The recorded information has been reviewed and is accurate.     Trixie Dredge, PA-C 01/29/16 2043  Lorre Nick, MD 01/29/16 2250

## 2016-01-29 NOTE — Discharge Instructions (Signed)
Read the information below.  Use the prescribed medication as directed.  Please discuss all new medications with your pharmacist.  You may return to the Emergency Department at any time for worsening condition or any new symptoms that concern you.  Call your obstetrician today to discuss medications prescribed.   If you develop itching or swelling in your mouth or throat or any difficulty swallowing or breathing, call 911 or return to the Emergency Department immediately for a recheck.     Hives Hives are itchy, red, swollen areas of the skin. They can vary in size and location on your body. Hives can come and go for hours or several days (acute hives) or for several weeks (chronic hives). Hives do not spread from person to person (noncontagious). They may get worse with scratching, exercise, and emotional stress. CAUSES   Allergic reaction to food, additives, or drugs.  Infections, including the common cold.  Illness, such as vasculitis, lupus, or thyroid disease.  Exposure to sunlight, heat, or cold.  Exercise.  Stress.  Contact with chemicals. SYMPTOMS   Red or white swollen patches on the skin. The patches may change size, shape, and location quickly and repeatedly.  Itching.  Swelling of the hands, feet, and face. This may occur if hives develop deeper in the skin. DIAGNOSIS  Your caregiver can usually tell what is wrong by performing a physical exam. Skin or blood tests may also be done to determine the cause of your hives. In some cases, the cause cannot be determined. TREATMENT  Mild cases usually get better with medicines such as antihistamines. Severe cases may require an emergency epinephrine injection. If the cause of your hives is known, treatment includes avoiding that trigger.  HOME CARE INSTRUCTIONS   Avoid causes that trigger your hives.  Take antihistamines as directed by your caregiver to reduce the severity of your hives. Non-sedating or low-sedating  antihistamines are usually recommended. Do not drive while taking an antihistamine.  Take any other medicines prescribed for itching as directed by your caregiver.  Wear loose-fitting clothing.  Keep all follow-up appointments as directed by your caregiver. SEEK MEDICAL CARE IF:   You have persistent or severe itching that is not relieved with medicine.  You have painful or swollen joints. SEEK IMMEDIATE MEDICAL CARE IF:   You have a fever.  Your tongue or lips are swollen.  You have trouble breathing or swallowing.  You feel tightness in the throat or chest.  You have abdominal pain. These problems may be the first sign of a life-threatening allergic reaction. Call your local emergency services (911 in [place] MAKE SURE YOU:   Understand these instructions.  Will watch your condition.  Will get help right away if you are not doing well or get worse.   This information is not intended to replace advice given to you by your health care provider. Make sure you discuss any questions you have with your health care provider.   Document Released: 08/13/2005 Document Revised: 08/18/2013 Document Reviewed: 11/06/2011 Elsevier Interactive Patient Education [employer]2016 Elsevier Inc.

## 2016-02-16 LAB — OB RESULTS CONSOLE RPR: RPR: NONREACTIVE

## 2016-02-21 ENCOUNTER — Ambulatory Visit (INDEPENDENT_AMBULATORY_CARE_PROVIDER_SITE_OTHER): Payer: 59 | Admitting: Allergy and Immunology

## 2016-02-21 ENCOUNTER — Encounter: Payer: Self-pay | Admitting: Allergy and Immunology

## 2016-02-21 VITALS — BP 112/72 | HR 80 | Temp 98.5°F | Resp 16 | Ht 63.0 in | Wt 284.0 lb

## 2016-02-21 DIAGNOSIS — Z3490 Encounter for supervision of normal pregnancy, unspecified, unspecified trimester: Secondary | ICD-10-CM

## 2016-02-21 DIAGNOSIS — L509 Urticaria, unspecified: Secondary | ICD-10-CM | POA: Diagnosis not present

## 2016-02-21 MED ORDER — CETIRIZINE HCL 10 MG PO TABS: 10.0000 mg | ORAL_TABLET | Freq: Two times a day (BID) | ORAL | Status: DC

## 2016-02-21 MED ORDER — MONTELUKAST SODIUM 10 MG PO TABS: 10.0000 mg | ORAL_TABLET | Freq: Every day | ORAL | Status: DC

## 2016-02-21 NOTE — Progress Notes (Signed)
Dear Traci Castro,  Thank you for referring Traci Castro to the [facility] on 02/21/2016.   Below is a summation of this patient's evaluation and recommendations.  Thank you for your referral. I will keep you informed about this patient's response to treatment.   If you have any questions please to do hestitate to contact me.   Sincerely,  Jessica Priest, MD [place] [facility]   ______________________________________________________________________    NEW PATIENT NOTE  Referring Provider: Bjorn Pippin, MD Primary Provider: Myna Hidalgo, Judie Petit, DO Date of office visit: 02/21/2016    Subjective:   Chief Complaint:  Traci Castro (DOB: 06-17-1996) is a 20 y.o. female with a chief complaint of Urticaria  who presents to the clinic on 02/21/2016 with the following problems:  HPI: Traci Castro presents to this clinic in evaluation of hives. Sometime around the first week of June she developed diffuse urticaria manifested as red raised itchy lesions across her body requiring emergency room evaluation and treatment. Since that point time she has continued to have significant urticarial lesions affecting all areas of her body along with diffuse pruritus not responsive to the administration of Benadryl. Her lesions never heal with scar or hyperpigmentation although they do last about one day or so. She has no associated systemic or constitutional symptoms. It should be noted that she is 6 months pregnant at this point in time. There is no obvious trigger giving rise to this issue. She does not really have other symptoms suggesting atopic disease.  She has a history of psoriasis requiring Humira treatment up until January 2017 when she became pregnant. Her last Humira dose was December.  Past Medical History  Diagnosis Date  . Psoriasis   . Gastritis     2 bouts  . Vision abnormalities     wears glasses    . Migraine   . Anxiety   . Depression     Past Surgical History  Procedure Laterality Date  . Esophagogastroduodenoscopy N/A 10/17/2012    Procedure: ESOPHAGOGASTRODUODENOSCOPY (EGD);  Surgeon: Jon Gills, MD;  Location: [facility] OR;  Service: Gastroenterology;  Laterality: N/A;      Medication List           acetaminophen 500 MG tablet  Commonly known as:  TYLENOL  Take 1,000 mg by mouth every 6 (six) hours as needed for moderate pain. Reported on 02/21/2016     diphenhydrAMINE 12.5 MG/5ML syrup  Commonly known as:  BENYLIN  Take 10 mLs (25 mg total) by mouth every 8 (eight) hours. X 3 days then PRN itching, rash     metoCLOPramide 10 MG tablet  Commonly known as:  REGLAN  Take 1 tablet (10 mg total) by mouth 3 (three) times daily with meals.     omeprazole 40 MG capsule  Commonly known as:  PRILOSEC  Take 40 mg by mouth daily. Reported on 02/21/2016     ondansetron 4 MG disintegrating tablet  Commonly known as:  ZOFRAN-ODT  1 TABLET ON THE TONGUE AND ALLOW TO DISSOLVE THREE TIMES A DAY AS NEEDED ORALLY 30 DAYS     prenatal multivitamin Tabs tablet  Take 1 tablet by mouth daily at 12 noon.        Allergies  Allergen Reactions  . Tramadol Nausea And Vomiting    Review of systems negative except as noted in HPI / PMHx or noted below:  Review of Systems  Constitutional: Negative.   HENT: Negative.   Eyes: Negative.   Respiratory: Negative.   Cardiovascular: Negative.   Gastrointestinal: Negative.   Genitourinary: Negative.   Musculoskeletal: Negative.   Skin: Negative.   Neurological: Negative.   Endo/Heme/Allergies: Negative.   Psychiatric/Behavioral: Negative.     Family History  Problem Relation Age of Onset  . Cholelithiasis Mother   . Hyperlipidemia Mother   . Hypertension Mother   . Mental illness Mother   . Cholelithiasis Maternal Uncle   . Hyperlipidemia Maternal Uncle   . Hypertension Maternal Uncle   . Cholelithiasis Maternal Grandmother    . Arthritis Maternal Grandmother   . COPD Maternal Grandmother   . Depression Maternal Grandmother   . Diabetes Maternal Grandmother   . Heart disease Maternal Grandmother   . Hyperlipidemia Maternal Grandmother   . Hypertension Maternal Grandmother   . Ulcers Neg Hx   . Alcohol abuse Neg Hx   . Asthma Neg Hx   . Birth defects Neg Hx   . Cancer Neg Hx   . Drug abuse Neg Hx   . Early death Neg Hx   . Hearing loss Neg Hx   . Kidney disease Neg Hx   . Mental retardation Neg Hx   . Miscarriages / Stillbirths Neg Hx   . Stroke Neg Hx   . Vision loss Neg Hx   . Learning disabilities Paternal Grandmother   . Mental illness Paternal Grandmother     Social History   Social History  . Marital Status: Single    Spouse Name: N/A  . Number of Children: N/A  . Years of Education: N/A   Occupational History  . Not on file.   Social History Main Topics  . Smoking status: Never Smoker   . Smokeless tobacco: Never Used  . Alcohol Use: No  . Drug Use: No  . Sexual Activity: Yes    Birth Control/ Protection: None   Other Topics Concern  . Not on file   Social History Narrative   10th grade    Environmental and Social history  Lives in a house with a dry environment, no animals located inside the household, carpeting in the bedroom, no plastic for the bed or pillow, and no smokers located inside the household   Objective:   Filed Vitals:   02/21/16 1440  BP: 112/72  Pulse: 80  Temp: 98.5 F (36.9 C)  Resp: 16   Height: 5\' 3"  (160 cm) Weight: 284 lb (128.822 kg)  Physical Exam  Constitutional: She is well-developed, well-nourished, and in no distress.  HENT:  Head: Normocephalic. Head is without right periorbital erythema and without left periorbital erythema.  Right Ear: Tympanic membrane, external ear and ear canal normal.  Left Ear: Tympanic membrane, external ear and ear canal normal.  Nose: Nose normal. No mucosal edema or rhinorrhea.  Mouth/Throat:  Oropharynx is clear and moist and mucous membranes are normal. No oropharyngeal exudate.  Eyes: Conjunctivae and lids are normal. Pupils are equal, round, and reactive to light.  Neck: Trachea normal. No tracheal deviation present. No thyromegaly present.  Cardiovascular: Normal rate, regular rhythm, S1 normal, S2 normal and normal heart sounds.   No murmur heard. Pulmonary/Chest: Effort normal. No stridor. No tachypnea. No respiratory distress. She has no wheezes. She has no rales. She exhibits no tenderness.  Abdominal: Soft.  Musculoskeletal: She exhibits no edema or tenderness.  Lymphadenopathy:       Head (right side): No tonsillar adenopathy present.  Head (left side): No tonsillar adenopathy present.    She has no cervical adenopathy.    She has no axillary adenopathy.  Neurological: She is alert. Gait normal.  Skin: Rash (Diffuse urticarial lesions across entire body. Hyperpigmented circular lesions across back) noted. She is not diaphoretic. No erythema. No pallor. Nails show no clubbing.  Psychiatric: Mood and affect normal.     Diagnostics: None  Assessment and Plan:    1. Urticaria   2. Pregnancy     1. Cetirizine 10 mg one tablet one time per day. Can increase to twice a day if needed  2. Montelukast 10 mg one tablet once a day  3. Can continue with Benadryl if needed  4. Prednisone 10 mg one tablet once a day for the next 10 days only  5. 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 I suspect that this is probably tied up with her pregnancy and at this point in time were not going to have her undergo any further treatment or evaluation except to use pregnancy class B agents as noted above in an attempt to decrease her immunological hyperreactivity. I will regroup with her in 10 days and we will make a decision about how to proceed pending her response.  Jessica Priest,  MD  [facility]

## 2016-02-21 NOTE — Patient Instructions (Addendum)
  1. Cetirizine 10 mg one tablet one time per day. Can increase to twice a day if needed  2. Montelukast 10 mg one tablet once a day  3. Can continue with Benadryl if needed  4. Prednisone 10 mg one tablet once a day for the next 10 days only  5. Return to clinic in 10 days or earlier if problem

## 2016-02-24 ENCOUNTER — Telehealth: Payer: Self-pay | Admitting: *Deleted

## 2016-02-24 NOTE — Telephone Encounter (Signed)
Pt called to office looking to schedule an appt with an office that would give waterbirth options. Pt states that she has been seen at [facility] and was told that she may find care elsewhere that would allow for waterbirths. Pt information was reviewed with Dr Clearance CootsHarper.  Her request to come to our office was denied since she is an established pt with another OB office. Pt was advised she may [facility] to see if they would allow her to change practice due to her request.

## 2016-03-02 ENCOUNTER — Encounter: Payer: Self-pay | Admitting: *Deleted

## 2016-03-07 ENCOUNTER — Encounter: Payer: Self-pay | Admitting: *Deleted

## 2016-03-15 ENCOUNTER — Ambulatory Visit (INDEPENDENT_AMBULATORY_CARE_PROVIDER_SITE_OTHER): Payer: 59 | Admitting: Advanced Practice Midwife

## 2016-03-15 ENCOUNTER — Encounter: Payer: Self-pay | Admitting: Family

## 2016-03-15 VITALS — BP 121/60 | HR 91 | Wt 284.2 lb

## 2016-03-15 DIAGNOSIS — Z3493 Encounter for supervision of normal pregnancy, unspecified, third trimester: Secondary | ICD-10-CM

## 2016-03-15 DIAGNOSIS — Z3483 Encounter for supervision of other normal pregnancy, third trimester: Secondary | ICD-10-CM

## 2016-03-15 DIAGNOSIS — Z6841 Body Mass Index (BMI) 40.0 and over, adult: Secondary | ICD-10-CM | POA: Insufficient documentation

## 2016-03-15 LAB — POCT URINALYSIS DIP (DEVICE)
Glucose, UA: 100 mg/dL — AB
HGB URINE DIPSTICK: NEGATIVE
KETONES UR: NEGATIVE mg/dL
Leukocytes, UA: NEGATIVE
NITRITE: NEGATIVE
PH: 6.5 (ref 5.0–8.0)
Protein, ur: 30 mg/dL — AB
Specific Gravity, Urine: >=1.03 (ref 1.005–1.030)
Urobilinogen, UA: 1 mg/dL (ref 0.0–1.0)

## 2016-03-15 NOTE — Progress Notes (Signed)
Subjective:  Traci Castro is a 20 y.o. G1P0 at 6165w1d being seen today for NOB transfer from [facility] because she wants to have a waterbirth. Took waterbirth class. She is currently monitored for the following issues for this low-risk pregnancy and has Supervision of normal pregnancy in third trimester and BMI 45.0-49.9, adult (HCC) on her problem list.  Patient reports no complaints.  Contractions: Irritability. Vag. Bleeding: None.  Movement: Present. Denies leaking of fluid.   The following portions of the patient's history were reviewed and updated as appropriate: allergies, current medications, past family history, past medical history, past social history, past surgical history and problem list. Problem list updated.  Objective:   Filed Vitals:   03/15/16 1534  BP: 121/60  Pulse: 91  Weight: 284 lb 3.2 oz (128.912 kg)    Fetal Status: Fetal Heart Rate (bpm): 129 Fundal Height: 34 cm Movement: Present  Presentation: Vertex  General:  Alert, oriented and cooperative. Patient is in no acute distress.  Skin: Skin is warm and dry. No rash noted.   Cardiovascular: Normal heart rate noted  Respiratory: Normal respiratory effort, no problems with respiration noted  Abdomen: Soft, gravid, appropriate for gestational age. Pain/Pressure: Present     Pelvic:  Cervical exam deferred        Extremities: Normal range of motion.  Edema: Trace  Mental Status: Normal mood and affect. Normal behavior. Normal judgment and thought content.   Urinalysis: Urine Protein: 1+ Urine Glucose: 1+  Assessment and Plan:  Pregnancy: G1P0 at 4865w1d  1. Supervision of normal pregnancy in third trimester   2. BMI 45.0-49.9, adult (HCC)  Preterm labor symptoms and general obstetric precautions including but not limited to vaginal bleeding, contractions, leaking of fluid and fetal movement were reviewed in detail with the patient. Please refer to After Visit Summary for other counseling recommendations.   Return in about 2 weeks (around 03/29/2016).   Dorathy KinsmanVirginia Shun Pletz, CNM

## 2016-03-15 NOTE — Patient Instructions (Signed)
Braxton Hicks Contractions Contractions of the uterus can occur throughout pregnancy. Contractions are not always a sign that you are in labor.  WHAT ARE BRAXTON HICKS CONTRACTIONS?  Contractions that occur before labor are called Braxton Hicks contractions, or false labor. Toward the end of pregnancy (32-34 weeks), these contractions can develop more often and may become more forceful. This is not true labor because these contractions do not result in opening (dilatation) and thinning of the cervix. They are sometimes difficult to tell apart from true labor because these contractions can be forceful and people have different pain tolerances. You should not feel embarrassed if you go to the hospital with false labor. Sometimes, the only way to tell if you are in true labor is for your health care provider to look for changes in the cervix. If there are no prenatal problems or other health problems associated with the pregnancy, it is completely safe to be sent home with false labor and await the onset of true labor. HOW CAN YOU TELL THE DIFFERENCE BETWEEN TRUE AND FALSE LABOR? False Labor  The contractions of false labor are usually shorter and not as hard as those of true labor.   The contractions are usually irregular.   The contractions are often felt in the front of the lower abdomen and in the groin.   The contractions may go away when you walk around or change positions while lying down.   The contractions get weaker and are shorter lasting as time goes on.   The contractions do not usually become progressively stronger, regular, and closer together as with true labor.  True Labor  Contractions in true labor last 30-70 seconds, become very regular, usually become more intense, and increase in frequency.   The contractions do not go away with walking.   The discomfort is usually felt in the top of the uterus and spreads to the lower abdomen and low back.   True labor can be  determined by your health care provider with an exam. This will show that the cervix is dilating and getting thinner.  WHAT TO REMEMBER  Keep up with your usual exercises and follow other instructions given by your health care provider.   Take medicines as directed by your health care provider.   Keep your regular prenatal appointments.   Eat and drink lightly if you think you are going into labor.   If Braxton Hicks contractions are making you uncomfortable:   Change your position from lying down or resting to walking, or from walking to resting.   Sit and rest in a tub of warm water.   Drink 2-3 glasses of water. Dehydration may cause these contractions.   Do slow and deep breathing several times an hour.  WHEN SHOULD I SEEK IMMEDIATE MEDICAL CARE? Seek immediate medical care if:  Your contractions become stronger, more regular, and closer together.   You have fluid leaking or gushing from your vagina.   You have a fever.   You pass blood-tinged mucus.   You have vaginal bleeding.   You have continuous abdominal pain.   You have low back pain that you never had before.   You feel your baby's head pushing down and causing pelvic pressure.   Your baby is not moving as much as it used to.    This information is not intended to replace advice given to you by your health care provider. Make sure you discuss any questions you have with your health care  provider.   Document Released: 08/13/2005 Document Revised: 08/18/2013 Document Reviewed: 05/25/2013 Elsevier Interactive Patient Education 2016 [employer]  Considering RittmanWaterbirth? Guide for patients at [facility]  Why consider waterbirth?  . Gentle birth for babies . Less pain medicine used in labor . May allow for passive descent/less pushing . May reduce perineal tears  . More mobility and instinctive maternal position changes . Increased maternal relaxation . Reduced  blood pressure in labor  Is waterbirth safe? What are the risks of infection, drowning or other complications?  . Infection: o Very low risk (3.7 % for tub vs 4.8% for bed) o 7 in 8000 waterbirths with documented infection o Poorly cleaned equipment most common cause o Slightly lower group B strep transmission rate  . Drowning o Maternal:  - Very low risk   - Related to seizures or fainting o Newborn:  - Very low risk. No evidence of increased risk of respiratory problems in multiple large studies - Physiological protection from breathing under water - Avoid underwater birth if there are any fetal complications - Once baby's head is out of the water, keep it out.  . Birth complication o Some reports of cord trauma, but risk decreased by bringing baby to surface gradually o No evidence of increased risk of shoulder dystocia. Mothers can usually change positions faster in water than in a bed, possibly aiding the maneuvers to free the shoulder.   Am I a candidate for waterbirth?  Yes, if you are: . Full-term (37 weeks or greater)  . Have had an uncomplicated pregnancy and labor  No, if you have: Marland Kitchen. Preterm birth less than 37 weeks . Thick, particulate meconium stained fluid . Maternal fever over 101 . Heavy bleeding or signs of placental abruption . Pre-eclampsia  . Any abnormal fetal heart rate pattern . Breech presentation . Twins  . Very large baby . Active communicable infection (this does NOT include group B strep) . Significant limitation to mobility  Please remember that birth is unpredictable. Under certain unforeseeable circumstances your provider may advise against giving birth in the tub. These decisions will be made on a case-by-case basis and with the safety of you and your baby as our highest priority.  Requirements for patients planning waterbirth  . Ask your midwife if you will be a candidate for waterbirth. . Attend the Noelle PennerWaterbirth Class at [facility]. Contact Childbirth Education at [phone] or [phone] for dates and times. The class is free and we strongly encourage you to bring your support person. You will receive a certificate of participation to show to your midwife or doctor. . Supplies needed for [facility] and [facility] patients: o Single-use disposable tub liner (birthpoolinabox.com  REGULAR size) o New garden hose labeled "lead-free", "suitable for drinking water", "non-toxic" OR "water potable" o Garden hose to remove the dirty water o Faucet adaptor to attach hose to faucet         o Electric drain pump to remove water (We recommend 792 gallon per hour or greater pump.)  o Fish net o Bathing suit top (optional) o Long-handled mirror (optional)  [email or web address] sells tubs for $120 if you would rather purchase your own tub

## 2016-03-16 ENCOUNTER — Other Ambulatory Visit: Payer: Self-pay

## 2016-03-16 DIAGNOSIS — Z23 Encounter for immunization: Secondary | ICD-10-CM | POA: Diagnosis not present

## 2016-03-19 ENCOUNTER — Encounter: Payer: Self-pay | Admitting: *Deleted

## 2016-04-09 ENCOUNTER — Inpatient Hospital Stay (HOSPITAL_COMMUNITY)
Admission: AD | Admit: 2016-04-09 | Discharge: 2016-04-09 | Disposition: A | Discharge status: Home / Self Care | Payer: 59 | Source: Ambulatory Visit | Attending: Obstetrics & Gynecology | Admitting: Obstetrics & Gynecology

## 2016-04-09 ENCOUNTER — Encounter (HOSPITAL_COMMUNITY): Payer: Self-pay

## 2016-04-09 ENCOUNTER — Ambulatory Visit (INDEPENDENT_AMBULATORY_CARE_PROVIDER_SITE_OTHER): Payer: 59 | Admitting: Family Medicine

## 2016-04-09 VITALS — BP 117/62 | HR 76 | Wt 288.0 lb

## 2016-04-09 DIAGNOSIS — Z3A33 33 weeks gestation of pregnancy: Secondary | ICD-10-CM | POA: Insufficient documentation

## 2016-04-09 DIAGNOSIS — O2693 Pregnancy related conditions, unspecified, third trimester: Secondary | ICD-10-CM | POA: Diagnosis not present

## 2016-04-09 DIAGNOSIS — Z6841 Body Mass Index (BMI) 40.0 and over, adult: Secondary | ICD-10-CM

## 2016-04-09 DIAGNOSIS — Z8249 Family history of ischemic heart disease and other diseases of the circulatory system: Secondary | ICD-10-CM | POA: Diagnosis not present

## 2016-04-09 DIAGNOSIS — O3660X0 Maternal care for excessive fetal growth, unspecified trimester, not applicable or unspecified: Secondary | ICD-10-CM

## 2016-04-09 DIAGNOSIS — O3663X0 Maternal care for excessive fetal growth, third trimester, not applicable or unspecified: Secondary | ICD-10-CM

## 2016-04-09 DIAGNOSIS — Z888 Allergy status to other drugs, medicaments and biological substances status: Secondary | ICD-10-CM | POA: Insufficient documentation

## 2016-04-09 DIAGNOSIS — Z3493 Encounter for supervision of normal pregnancy, unspecified, third trimester: Secondary | ICD-10-CM

## 2016-04-09 DIAGNOSIS — O26893 Other specified pregnancy related conditions, third trimester: Secondary | ICD-10-CM | POA: Diagnosis not present

## 2016-04-09 DIAGNOSIS — M545 Low back pain: Secondary | ICD-10-CM | POA: Insufficient documentation

## 2016-04-09 DIAGNOSIS — Z3483 Encounter for supervision of other normal pregnancy, third trimester: Secondary | ICD-10-CM

## 2016-04-09 DIAGNOSIS — M549 Dorsalgia, unspecified: Secondary | ICD-10-CM | POA: Diagnosis present

## 2016-04-09 LAB — URINALYSIS, ROUTINE W REFLEX MICROSCOPIC
Bilirubin Urine: NEGATIVE
GLUCOSE, UA: NEGATIVE mg/dL
HGB URINE DIPSTICK: NEGATIVE
Ketones, ur: NEGATIVE mg/dL
LEUKOCYTES UA: NEGATIVE
Nitrite: NEGATIVE
PH: 6.5 (ref 5.0–8.0)
Protein, ur: NEGATIVE mg/dL
SPECIFIC GRAVITY, URINE: 1.01 (ref 1.005–1.030)

## 2016-04-09 LAB — POCT URINALYSIS DIP (DEVICE)
BILIRUBIN URINE: NEGATIVE
GLUCOSE, UA: NEGATIVE mg/dL
HGB URINE DIPSTICK: NEGATIVE
Ketones, ur: NEGATIVE mg/dL
NITRITE: NEGATIVE
Protein, ur: 30 mg/dL — AB
Specific Gravity, Urine: 1.02 (ref 1.005–1.030)
Urobilinogen, UA: 1 mg/dL (ref 0.0–1.0)
pH: 7 (ref 5.0–8.0)

## 2016-04-09 LAB — WET PREP, GENITAL
CLUE CELLS WET PREP: NONE SEEN
SPERM: NONE SEEN
Trich, Wet Prep: NONE SEEN
WBC WET PREP: NONE SEEN
Yeast Wet Prep HPF POC: NONE SEEN

## 2016-04-09 NOTE — MAU Provider Note (Signed)
History     CSN: [id number]  Arrival date and time: 04/09/16 0301   First Provider Initiated Contact with Patient 04/09/16 [phone]      Chief complaint: Back pain, mucousy discharge  HPI  Patient is a 20 year old G1 P0 at 33 weeks and 5 days who presents today for one episode of mucousy discharge on the toilet. When she weighed this evening. She also reports low back pain throughout the day today. She has been more active today organizing the baby's room and vomiting. Additionally she did a lot of walking yesterday. She has no dysuria. She has no abdominal cramping. She denies any abnormal discharge besides the one episode she experienced this evening.   OB History    Gravida Para Term Preterm AB Living   1             SAB TAB Ectopic Multiple Live Births                  Past Medical History:  Diagnosis Date  . Anxiety   . Depression   . Gastritis    2 bouts  . Migraine   . Psoriasis   . Vision abnormalities    wears glasses    Past Surgical History:  Procedure Laterality Date  . ESOPHAGOGASTRODUODENOSCOPY N/A 10/17/2012   Procedure: ESOPHAGOGASTRODUODENOSCOPY (EGD);  Surgeon: Jon GillsJoseph H Clark, MD;  Location[facility];  Service: Gastroenterology;  Laterality: N/A;    Family History  Problem Relation Age of Onset  . Cholelithiasis Mother   . Hyperlipidemia Mother   . Hypertension Mother   . Mental illness Mother   . Depression Mother   . Cholelithiasis Maternal Uncle   . Hyperlipidemia Maternal Uncle   . Hypertension Maternal Uncle   . Cholelithiasis Maternal Grandmother   . Arthritis Maternal Grandmother   . COPD Maternal Grandmother   . Depression Maternal Grandmother   . Diabetes Maternal Grandmother   . Heart disease Maternal Grandmother   . Hyperlipidemia Maternal Grandmother   . Hypertension Maternal Grandmother   . Learning disabilities Paternal Grandmother   . Mental illness Paternal Grandmother   . Hyperlipidemia Paternal Grandfather   . Diabetes  Paternal Grandfather   . Ulcers Neg Hx   . Alcohol abuse Neg Hx   . Asthma Neg Hx   . Birth defects Neg Hx   . Cancer Neg Hx   . Drug abuse Neg Hx   . Early death Neg Hx   . Hearing loss Neg Hx   . Kidney disease Neg Hx   . Mental retardation Neg Hx   . Miscarriages / Stillbirths Neg Hx   . Stroke Neg Hx   . Vision loss Neg Hx     Social History  Substance Use Topics  . Smoking status: Never Smoker  . Smokeless tobacco: Never Used  . Alcohol use No    Allergies:  Allergies  Allergen Reactions  . Tramadol Nausea And Vomiting    Prescriptions Prior to Admission  Medication Sig Dispense Refill Last Dose  . acetaminophen (TYLENOL) 500 MG tablet Take 1,000 mg by mouth every 6 (six) hours as needed for moderate pain. Reported on 02/21/2016   Taking  . cetirizine (ZYRTEC) 10 MG tablet Take 1 tablet (10 mg total) by mouth 2 (two) times daily. 60 tablet 5 Taking  . diphenhydrAMINE (BENYLIN) 12.5 MG/5ML syrup Take 10 mLs (25 mg total) by mouth every 8 (eight) hours. X 3 days then PRN itching, rash 120 mL 0 Taking  .  metoCLOPramide (REGLAN) 10 MG tablet Take 1 tablet (10 mg total) by mouth 3 (three) times daily with meals. (Patient not taking: Reported on 02/21/2016) 90 tablet 0 Not Taking  . montelukast (SINGULAIR) 10 MG tablet Take 1 tablet (10 mg total) by mouth at bedtime. (Patient not taking: Reported on 03/15/2016) 30 tablet 5 Not Taking  . omeprazole (PRILOSEC) 40 MG capsule Take 40 mg by mouth daily. Reported on 02/21/2016  3 Taking  . ondansetron (ZOFRAN-ODT) 4 MG disintegrating tablet 1 TABLET ON THE TONGUE AND ALLOW TO DISSOLVE THREE TIMES A DAY AS NEEDED ORALLY 30 DAYS  2 Taking  . Prenatal Vit-Fe Fumarate-FA (PRENATAL MULTIVITAMIN) TABS tablet Take 1 tablet by mouth daily at 12 noon.   Taking    Review of Systems  Constitutional: Negative for chills and fever.  HENT: Negative for congestion.   Eyes: Negative for blurred vision and double vision.  Respiratory: Negative for  cough and shortness of breath.   Cardiovascular: Negative for chest pain and palpitations.  Gastrointestinal: Negative for abdominal pain, heartburn, nausea and vomiting.  Genitourinary: Negative for dysuria and urgency.  Musculoskeletal: Positive for back pain. Negative for myalgias.  Skin: Negative for itching and rash.  Neurological: Negative for dizziness, tingling and headaches.  Endo/Heme/Allergies: Negative for environmental allergies. Does not bruise/bleed easily.  Psychiatric/Behavioral: Negative for depression and suicidal ideas.   Physical Exam   Last menstrual period 08/17/2015.  Physical Exam  Constitutional: She is oriented to person, place, and time. She appears well-developed and well-nourished.  HENT:  Head: Normocephalic.  Left Ear: External ear normal.  Cardiovascular: Normal rate and intact distal pulses.   Respiratory: Effort normal. No respiratory distress. She has no wheezes.  GI: Soft. She exhibits no distension. There is no tenderness.  Gravid  Genitourinary:  Genitourinary Comments: Cervix closed thick and highin discharge on exam  Musculoskeletal: She exhibits no edema or deformity.  Neurological: She is alert and oriented to person, place, and time.  Skin: Skin is warm and dry.  Psychiatric: She has a normal mood and affect. Her behavior is normal.    MAU Course  Procedures  MDM In the MAU patient underwent wet prep testing and cervical exam. She did have continuous fetal monitoring which revealed a fetal heart rate in the 30s of the reactive NST for gestational age. Patient's wet prep and urinalysis were reviewed in both grossly negative. Patient discharged home with reassurance.  Assessment and Plan  #1 back pain likely musculoskeletal recommended stretching or heating pad. Patient can also use Tylenol. Patient voiced understanding  #2: Physiologic discharge. Recommended continue to monitor if it becomes heavier purulent can return for further  testing.  Ernestina Pennaicholas Schenk 04/09/2016, 3:54 AM

## 2016-04-09 NOTE — Discharge Instructions (Signed)

## 2016-04-09 NOTE — MAU Note (Signed)
Pt presents complaining of snot like discharge when she wipes and back pain. Pain started earlier today and comes and goes. Reports good fetal movement. Denies bleeding.

## 2016-04-09 NOTE — Progress Notes (Signed)
Subjective:  Traci Castro is a 20 y.o. G1P0 at 8112w5d being seen today for ongoing prenatal care.  She is currently monitored for the following issues for this low-risk pregnancy and has Supervision of normal pregnancy in third trimester and BMI 45.0-49.9, adult (HCC) on her problem list.  Patient reports no complaints.  Contractions: Not present.  .  Movement: Present. Denies leaking of fluid.   The following portions of the patient's history were reviewed and updated as appropriate: allergies, current medications, past family history, past medical history, past social history, past surgical history and problem list. Problem list updated.  Objective:   Vitals:   04/09/16 1419  BP: 117/62  Pulse: 76  Weight: 288 lb (130.6 kg)    Fetal Status: Fetal Heart Rate (bpm): 141   Movement: Present     General:  Alert, oriented and cooperative. Patient is in no acute distress.  Skin: Skin is warm and dry. No rash noted.   Cardiovascular: Normal heart rate noted  Respiratory: Normal respiratory effort, no problems with respiration noted  Abdomen: Soft, gravid, appropriate for gestational age. Pain/Pressure: Present     Pelvic:  Cervical exam deferred        Extremities: Normal range of motion.  Edema: Trace  Mental Status: Normal mood and affect. Normal behavior. Normal judgment and thought content.   Urinalysis:      Assessment and Plan:  Pregnancy: G1P0 at 8112w5d  1. Supervision of normal pregnancy in third trimester - POCT urinalysis dip (device)  2. BMI 45.0-49.9, adult (HCC)  3. Gestation size to date discrepancy, unspecified trimester, not applicable or unspecified fetus - Measuring larger than dates by 3 cm - US MFM OB FOLLOW UP; Future - US MFM OB COMP + 14 WK; Future    Preterm labor symptoms and general obstetric precautions including but not limited to vaginal bleeding, contractions, leaking of fluid and fetal movement were reviewed in detail with the patient. Please  refer to After Visit Summary for other counseling recommendations.  Return in about 2 weeks (around 04/23/2016) for Routine OB visit.   B[facility] W[facility] Woodland La Castro, [place]

## 2016-04-09 NOTE — Patient Instructions (Signed)

## 2016-04-09 NOTE — Progress Notes (Signed)
Patient has question about baby size.

## 2016-04-13 ENCOUNTER — Inpatient Hospital Stay (HOSPITAL_COMMUNITY)
Admission: AD | Admit: 2016-04-13 | Discharge: 2016-04-13 | Disposition: A | Discharge status: Home / Self Care | Payer: 59 | Source: Ambulatory Visit | Attending: Obstetrics and Gynecology | Admitting: Obstetrics and Gynecology

## 2016-04-13 ENCOUNTER — Encounter (HOSPITAL_COMMUNITY): Payer: Self-pay

## 2016-04-13 DIAGNOSIS — O99343 Other mental disorders complicating pregnancy, third trimester: Secondary | ICD-10-CM | POA: Diagnosis not present

## 2016-04-13 DIAGNOSIS — R110 Nausea: Secondary | ICD-10-CM | POA: Insufficient documentation

## 2016-04-13 DIAGNOSIS — M7918 Myalgia, other site: Secondary | ICD-10-CM

## 2016-04-13 DIAGNOSIS — Z6841 Body Mass Index (BMI) 40.0 and over, adult: Secondary | ICD-10-CM

## 2016-04-13 DIAGNOSIS — R0781 Pleurodynia: Secondary | ICD-10-CM | POA: Diagnosis not present

## 2016-04-13 DIAGNOSIS — Z3A34 34 weeks gestation of pregnancy: Secondary | ICD-10-CM | POA: Diagnosis not present

## 2016-04-13 DIAGNOSIS — O99353 Diseases of the nervous system complicating pregnancy, third trimester: Secondary | ICD-10-CM | POA: Insufficient documentation

## 2016-04-13 DIAGNOSIS — O99283 Endocrine, nutritional and metabolic diseases complicating pregnancy, third trimester: Secondary | ICD-10-CM | POA: Insufficient documentation

## 2016-04-13 DIAGNOSIS — Z9889 Other specified postprocedural states: Secondary | ICD-10-CM | POA: Diagnosis not present

## 2016-04-13 DIAGNOSIS — F419 Anxiety disorder, unspecified: Secondary | ICD-10-CM | POA: Insufficient documentation

## 2016-04-13 DIAGNOSIS — O26893 Other specified pregnancy related conditions, third trimester: Secondary | ICD-10-CM | POA: Insufficient documentation

## 2016-04-13 DIAGNOSIS — R1011 Right upper quadrant pain: Secondary | ICD-10-CM | POA: Insufficient documentation

## 2016-04-13 DIAGNOSIS — Z888 Allergy status to other drugs, medicaments and biological substances status: Secondary | ICD-10-CM | POA: Diagnosis not present

## 2016-04-13 DIAGNOSIS — O99713 Diseases of the skin and subcutaneous tissue complicating pregnancy, third trimester: Secondary | ICD-10-CM | POA: Insufficient documentation

## 2016-04-13 DIAGNOSIS — Z3493 Encounter for supervision of normal pregnancy, unspecified, third trimester: Secondary | ICD-10-CM

## 2016-04-13 DIAGNOSIS — K8020 Calculus of gallbladder without cholecystitis without obstruction: Secondary | ICD-10-CM

## 2016-04-13 DIAGNOSIS — Z79899 Other long term (current) drug therapy: Secondary | ICD-10-CM | POA: Diagnosis not present

## 2016-04-13 LAB — CBC WITH DIFFERENTIAL/PLATELET
BASOS PCT: 0 %
Basophils Absolute: 0 10*3/uL (ref 0.0–0.1)
EOS ABS: 0.1 10*3/uL (ref 0.0–0.7)
EOS PCT: 1 %
HCT: 28.5 % — ABNORMAL LOW (ref 36.0–46.0)
Hemoglobin: 9.3 g/dL — ABNORMAL LOW (ref 12.0–15.0)
LYMPHS ABS: 2.8 10*3/uL (ref 0.7–4.0)
Lymphocytes Relative: 24 %
MCH: 25.1 pg — AB (ref 26.0–34.0)
MCHC: 32.6 g/dL (ref 30.0–36.0)
MCV: 76.8 fL — ABNORMAL LOW (ref 78.0–100.0)
MONO ABS: 0.8 10*3/uL (ref 0.1–1.0)
MONOS PCT: 7 %
Neutro Abs: 8 10*3/uL — ABNORMAL HIGH (ref 1.7–7.7)
Neutrophils Relative %: 68 %
Platelets: 333 10*3/uL (ref 150–400)
RBC: 3.71 MIL/uL — ABNORMAL LOW (ref 3.87–5.11)
RDW: 14.3 % (ref 11.5–15.5)
WBC: 11.7 10*3/uL — ABNORMAL HIGH (ref 4.0–10.5)

## 2016-04-13 LAB — URINALYSIS, ROUTINE W REFLEX MICROSCOPIC
BILIRUBIN URINE: NEGATIVE
Glucose, UA: NEGATIVE mg/dL
Hgb urine dipstick: NEGATIVE
Ketones, ur: 15 mg/dL — AB
Leukocytes, UA: NEGATIVE
NITRITE: NEGATIVE
PH: 6 (ref 5.0–8.0)
Protein, ur: 30 mg/dL — AB

## 2016-04-13 LAB — COMPREHENSIVE METABOLIC PANEL
ALBUMIN: 2.8 g/dL — AB (ref 3.5–5.0)
ALK PHOS: 115 U/L (ref 38–126)
ALT: 14 U/L (ref 14–54)
ANION GAP: 7 (ref 5–15)
AST: 20 U/L (ref 15–41)
BILIRUBIN TOTAL: 0.2 mg/dL — AB (ref 0.3–1.2)
BUN: 9 mg/dL (ref 6–20)
CALCIUM: 8.8 mg/dL — AB (ref 8.9–10.3)
CO2: 22 mmol/L (ref 22–32)
CREATININE: 0.72 mg/dL (ref 0.44–1.00)
Chloride: 105 mmol/L (ref 101–111)
GFR calc Af Amer: >60 mL/min (ref >60)
GFR calc non Af Amer: >60 mL/min (ref >60)
GLUCOSE: 101 mg/dL — AB (ref 65–99)
Potassium: 3.7 mmol/L (ref 3.5–5.1)
Sodium: 134 mmol/L — ABNORMAL LOW (ref 135–145)
TOTAL PROTEIN: 6.5 g/dL (ref 6.5–8.1)

## 2016-04-13 LAB — URINE MICROSCOPIC-ADD ON

## 2016-04-13 LAB — LIPASE, BLOOD: Lipase: 50 U/L (ref 11–51)

## 2016-04-13 MED ORDER — ONDANSETRON 8 MG PO TBDP
8.0000 mg | ORAL_TABLET | Freq: Once | ORAL | Status: AC
  Administered 2016-04-13: 8 mg via ORAL
  Filled 2016-04-13: qty 1

## 2016-04-13 MED ORDER — LACTATED RINGERS IV BOLUS (SEPSIS)
1000.0000 mL | Freq: Once | INTRAVENOUS | Status: AC
  Administered 2016-04-13: 1000 mL via INTRAVENOUS

## 2016-04-13 MED ORDER — CYCLOBENZAPRINE HCL 5 MG PO TABS: 5.0000 mg | ORAL_TABLET | Freq: Three times a day (TID) | ORAL | 0 refills | Status: DC | PRN

## 2016-04-13 NOTE — Discharge Instructions (Signed)
Someone will call you for your RUQ ultrasound on Monday, if they don't call Monday then please call [phone]. Ultrasound is to rule gallbladder issues.   For your back pain, please try flexeril. This is a muscle relaxer and should relieve some of your pain.   Back Pain in Pregnancy Back pain during pregnancy is common. It happens in about half of all pregnancies. It is important for you and your baby that you remain active during your pregnancy.If you feel that back pain is not allowing you to remain active or sleep well, it is time to see your caregiver. Back pain may be caused by several factors related to changes during your pregnancy.Fortunately, unless you had trouble with your back before your pregnancy, the pain is likely to get better after you deliver. Low back pain usually occurs between the fifth and seventh months of pregnancy. It can, however, happen in the first couple months. Factors that increase the risk of back problems include:   Previous back problems.  Injury to your back.  Having twins or multiple births.  A chronic cough.  Stress.  Job-related repetitive motions.  Muscle or spinal disease in the back.  Family history of back problems, ruptured (herniated) discs, or osteoporosis.  Depression, anxiety, and panic attacks. CAUSES   When you are pregnant, your body produces a hormone called relaxin. This hormonemakes the ligaments connecting the low back and pubic bones more flexible. This flexibility allows the baby to be delivered more easily. When your ligaments are loose, your muscles need to work harder to support your back. Soreness in your back can come from tired muscles. Soreness can also come from back tissues that are irritated since they are receiving less support.  As the baby grows, it puts pressure on the nerves and blood vessels in your pelvis. This can cause back pain.  As the baby grows and gets heavier during pregnancy, the uterus pushes the  stomach muscles forward and changes your center of gravity. This makes your back muscles work harder to maintain good posture. SYMPTOMS  Lumbar pain during pregnancy Lumbar pain during pregnancy usually occurs at or above the waist in the center of the back. There may be pain and numbness that radiates into your leg or foot. This is similar to low back pain experienced by non-pregnant women. It usually increases with sitting for long periods of time, standing, or repetitive lifting. Tenderness may also be present in the muscles along your upper back. Posterior pelvic pain during pregnancy Pain in the back of the pelvis is more common than lumbar pain in pregnancy. It is a deep pain felt in your side at the waistline, or across the tailbone (sacrum), or in both places. You may have pain on one or both sides. This pain can also go into the buttocks and backs of the upper thighs. Pubic and groin pain may also be present. The pain does not quickly resolve with rest, and morning stiffness may also be present. Pelvic pain during pregnancy can be brought on by most activities. A high level of fitness before and during pregnancy may or may not prevent this problem. Labor pain is usually 1 to 2 minutes apart, lasts for about 1 minute, and involves a bearing down feeling or pressure in your pelvis. However, if you are at term with the pregnancy, constant low back pain can be the beginning of early labor, and you should be aware of this. DIAGNOSIS  X-rays of the back should not  be done during the first 12 to 14 weeks of the pregnancy and only when absolutely necessary during the rest of the pregnancy. MRIs do not give off radiation and are safe during pregnancy. MRIs also should only be done when absolutely necessary. HOME CARE INSTRUCTIONS  Exercise as directed by your caregiver. Exercise is the most effective way to prevent or manage back pain. If you have a back problem, it is especially important to avoid sports  that require sudden body movements. Swimming and walking are great activities.  Do not stand in one place for long periods of time.  Do not wear high heels.  Sit in chairs with good posture. Use a pillow on your lower back if necessary. Make sure your head rests over your shoulders and is not hanging forward.  Try sleeping on your side, preferably the left side, with a pillow or two between your legs. If you are sore after a night's rest, your bedmay betoo soft.Try placing a board between your mattress and box spring.  Listen to your body when lifting.If you are experiencing pain, ask for help or try bending yourknees more so you can use your leg muscles rather than your back muscles. Squat down when picking up something from the floor. Do not bend over.  Eat a healthy diet. Try to gain weight within your caregiver's recommendations.  Use heat or cold packs 3 to 4 times a day for 15 minutes to help with the pain.  Only take over-the-counter or prescription medicines for pain, discomfort, or fever as directed by your caregiver. Sudden (acute) back pain  Use bed rest for only the most extreme, acute episodes of back pain. Prolonged bed rest over 48 hours will aggravate your condition.  Ice is very effective for acute conditions.  Put ice in a plastic bag.  Place a towel between your skin and the bag.  Leave the ice on for 10 to 20 minutes every 2 hours, or as needed.  Using heat packs for 30 minutes prior to activities is also helpful. Continued back pain See your caregiver if you have continued problems. Your caregiver can help or refer you for appropriate physical therapy. With conditioning, most back problems can be avoided. Sometimes, a more serious issue may be the cause of back pain. You should be seen right away if new problems seem to be developing. Your caregiver may recommend:  A maternity girdle.  An elastic sling.  A back brace.  A massage therapist or  acupuncture. SEEK MEDICAL CARE IF:   You are not able to do most of your daily activities, even when taking the pain medicine you were given.  You need a referral to a physical therapist or chiropractor.  You want to try acupuncture. SEEK IMMEDIATE MEDICAL CARE IF:  You develop numbness, tingling, weakness, or problems with the use of your arms or legs.  You develop severe back pain that is no longer relieved with medicines.  You have a sudden change in bowel or bladder control.  You have increasing pain in other areas of the body.  You develop shortness of breath, dizziness, or fainting.  You develop nausea, vomiting, or sweating.  You have back pain which is similar to labor pains.  You have back pain along with your water breaking or vaginal bleeding.  You have back pain or numbness that travels down your leg.  Your back pain developed after you fell.  You develop pain on one side of your back. You  may have a kidney stone.  You see blood in your urine. You may have a bladder infection or kidney stone.  You have back pain with blisters. You may have shingles. Back pain is fairly common during pregnancy but should not be accepted as just part of the process. Back pain should always be treated as soon as possible. This will make your pregnancy as pleasant as possible.   This information is not intended to replace advice given to you by your health care provider. Make sure you discuss any questions you have with your health care provider.   Document Released: 11/21/2005 Document Revised: 11/05/2011 Document Reviewed: 01/02/2011 Elsevier Interactive Patient Education [employer]2016 [employer]vier Inc.

## 2016-04-13 NOTE — MAU Note (Signed)
Low back pain and Right sided pain under right breast, "rib" for one week.  It's a constant pain. No bleeding. No leaking. Baby moving well.

## 2016-04-13 NOTE — MAU Provider Note (Signed)
Chief Complaint:  Back Pain and Chest Pain   Traci Castro is  20 y.o. G1P0 at 6181w2d presents complaining of Back Pain and Chest Pain Patient with about 1 month of bilateral lower back pain. She states that pain is worse with walking around, improves with rest. No radiation of pain. No numbness or tingling down the leg. Previous hx of back pain in the past. No red flags.   Similarly, patient also now with rib pain/ RUQ. She states this a constant pain that started about 1 week ago. No inciting event. Denies any radiation of pain. Denies any association with food, specifically grease food. No radiation of the pain. Pain worse with movement, specifically lifting things overhead. Denies any fever or chills. Denies any changes in bowel movements. Endorses nausea but no vomiting. Normal appetite, drinking plenty of fluids per patient.   She states none contractions are associated with none vaginal bleeding, intact membranes, along with active fetal movement.  Obstetrical/Gynecological History: OB History    Gravida Para Term Preterm AB Living   1             SAB TAB Ectopic Multiple Live Births                 Past Medical History: Past Medical History:  Diagnosis Date  . Anxiety   . Depression   . Gastritis    2 bouts  . Migraine   . Psoriasis   . Vision abnormalities    wears glasses    Past Surgical History: Past Surgical History:  Procedure Laterality Date  . ESOPHAGOGASTRODUODENOSCOPY N/A 10/17/2012   Procedure: ESOPHAGOGASTRODUODENOSCOPY (EGD);  Surgeon: Jon GillsJoseph H Clark, MD;  [facility];  Service: Gastroenterology;  Laterality: N/A;    Family History: Family History  Problem Relation Age of Onset  . Cholelithiasis Mother   . Hyperlipidemia Mother   . Hypertension Mother   . Mental illness Mother   . Depression Mother   . Cholelithiasis Maternal Uncle   . Hyperlipidemia Maternal Uncle   . Hypertension Maternal Uncle   . Cholelithiasis Maternal Grandmother   .  Arthritis Maternal Grandmother   . COPD Maternal Grandmother   . Depression Maternal Grandmother   . Diabetes Maternal Grandmother   . Heart disease Maternal Grandmother   . Hyperlipidemia Maternal Grandmother   . Hypertension Maternal Grandmother   . Learning disabilities Paternal Grandmother   . Mental illness Paternal Grandmother   . Hyperlipidemia Paternal Grandfather   . Diabetes Paternal Grandfather   . Ulcers Neg Hx   . Alcohol abuse Neg Hx   . Asthma Neg Hx   . Birth defects Neg Hx   . Cancer Neg Hx   . Drug abuse Neg Hx   . Early death Neg Hx   . Hearing loss Neg Hx   . Kidney disease Neg Hx   . Mental retardation Neg Hx   . Miscarriages / Stillbirths Neg Hx   . Stroke Neg Hx   . Vision loss Neg Hx     Social History: Social History  Substance Use Topics  . Smoking status: Never Smoker  . Smokeless tobacco: Never Used  . Alcohol use No    Allergies:  Allergies  Allergen Reactions  . Tramadol Nausea And Vomiting    Meds:  Prescriptions Prior to Admission  Medication Sig Dispense Refill Last Dose  . acetaminophen (TYLENOL) 500 MG tablet Take 500 mg by mouth every 6 (six) hours as needed for moderate pain. Reported on  02/21/2016   04/12/2016 at Unknown time  . ondansetron (ZOFRAN-ODT) 4 MG disintegrating tablet 1 TABLET ON THE TONGUE AND ALLOW TO DISSOLVE THREE TIMES A DAY AS NEEDED ORALLY 30 DAYS  2 Past Month at Unknown time  . Prenatal Vit-Fe Fumarate-FA (PRENATAL MULTIVITAMIN) TABS tablet Take 1 tablet by mouth daily at 12 noon.   04/12/2016 at Unknown time  . cetirizine (ZYRTEC) 10 MG tablet Take 1 tablet (10 mg total) by mouth 2 (two) times daily. (Patient not taking: Reported on 04/13/2016) 60 tablet 5 Not Taking at Unknown time  . montelukast (SINGULAIR) 10 MG tablet Take 1 tablet (10 mg total) by mouth at bedtime. (Patient not taking: Reported on 04/13/2016) 30 tablet 5 Not Taking at Unknown time    Review of Systems   Constitutional: Negative for fever  and chills Eyes: Negative for visual disturbances Respiratory: Negative for shortness of breath, dyspnea Cardiovascular: Negative for chest pain or palpitations  Gastrointestinal: Negative for vomiting, diarrhea and constipation Genitourinary: Negative for dysuria and urgency Musculoskeletal: Negative for back pain, joint pain, myalgias.  Normal ROM  Neurological: Negative for dizziness and headaches    Physical Exam  Blood pressure 119/73, pulse 99, temperature 98.2 F (36.8 C), temperature source Oral, resp. rate 18, last menstrual period 08/17/2015, SpO2 99 %. GENERAL: Well-developed, well-nourished female in no acute distress.  LUNGS: Clear to auscultation bilaterally.  HEART: Regular rate and rhythm. ABDOMEN: Soft, gravid abdomen, diffuse pain throughout, however specifically worse along RUQ, positive murphy signs  EXTREMITIES: Nontender, no edema, 2+ distal pulses. Back: Pain along bilateral SI joints   FHT:  Baseline rate 140 bpm   Variability moderate  Accelerations present   Decelerations none Contractions: a couple of contractions noted on tocometer initially, however nothing thereafter   Labs: Results for orders placed or performed during the hospital encounter of 04/13/16 (from the past 24 hour(s))  Urinalysis, Routine w reflex microscopic (not at [facility])   Collection Time: 04/13/16  7:06 PM  Result Value Ref Range   Color, Urine YELLOW YELLOW   APPearance CLEAR CLEAR   Specific Gravity, Urine >1.030 (H) 1.005 - 1.030   pH 6.0 5.0 - 8.0   Glucose, UA NEGATIVE NEGATIVE mg/dL   Hgb urine dipstick NEGATIVE NEGATIVE   Bilirubin Urine NEGATIVE NEGATIVE   Ketones, ur 15 (A) NEGATIVE mg/dL   Protein, ur 30 (A) NEGATIVE mg/dL   Nitrite NEGATIVE NEGATIVE   Leukocytes, UA NEGATIVE NEGATIVE  Urine microscopic-add on   Collection Time: 04/13/16  7:06 PM  Result Value Ref Range   Squamous Epithelial / LPF 6-30 (A) NONE SEEN   WBC, UA 0-5 0 - 5 WBC/hpf   RBC / HPF 0-5 0 - 5  RBC/hpf   Bacteria, UA FEW (A) NONE SEEN  Comprehensive metabolic panel   Collection Time: 04/13/16  8:05 PM  Result Value Ref Range   Sodium 134 (L) 135 - 145 mmol/L   Potassium 3.7 3.5 - 5.1 mmol/L   Chloride 105 101 - 111 mmol/L   CO2 22 22 - 32 mmol/L   Glucose, Bld 101 (H) 65 - 99 mg/dL   BUN 9 6 - 20 mg/dL   Creatinine, Ser 4.54 0.44 - 1.00 mg/dL   Calcium 8.8 (L) 8.9 - 10.3 mg/dL   Total Protein 6.5 6.5 - 8.1 g/dL   Albumin 2.8 (L) 3.5 - 5.0 g/dL   AST 20 15 - 41 U/L   ALT 14 14 - 54 U/L   Alkaline Phosphatase 115  38 - 126 U/L   Total Bilirubin 0.2 (L) 0.3 - 1.2 mg/dL   GFR calc non Af Amer >60 >60 mL/min   GFR calc Af Amer >60 >60 mL/min   Anion gap 7 5 - 15  Lipase, blood   Collection Time: 04/13/16  8:05 PM  Result Value Ref Range   Lipase 50 11 - 51 U/L  CBC with Differential/Platelet   Collection Time: 04/13/16  8:05 PM  Result Value Ref Range   WBC 11.7 (H) 4.0 - 10.5 K/uL   RBC 3.71 (L) 3.87 - 5.11 MIL/uL   Hemoglobin 9.3 (L) 12.0 - 15.0 g/dL   HCT 16.128.5 (L) 09.636.0 - 04.546.0 %   MCV 76.8 (L) 78.0 - 100.0 fL   MCH 25.1 (L) 26.0 - 34.0 pg   MCHC 32.6 30.0 - 36.0 g/dL   RDW 40.914.3 81.111.5 - 91.415.5 %   Platelets 333 150 - 400 K/uL   Neutrophils Relative % 68 %   Neutro Abs 8.0 (H) 1.7 - 7.7 K/uL   Lymphocytes Relative 24 %   Lymphs Abs 2.8 0.7 - 4.0 K/uL   Monocytes Relative 7 %   Monocytes Absolute 0.8 0.1 - 1.0 K/uL   Eosinophils Relative 1 %   Eosinophils Absolute 0.1 0.0 - 0.7 K/uL   Basophils Relative 0 %   Basophils Absolute 0.0 0.0 - 0.1 K/uL   Imaging Studies:  No results found.  Assessment: Traci Castro is  20 y.o. G1P0 at 4878w2d presents with RUQ pain.   Plan:  Back Pain on exam patient positive for SI Joint pain. Pain likely due to patient's weight. No red flags  - Will provide flexeril TID prn  - Note for patient not to lift   RUQ Pain; Diff includes Musculoskeletal pain vs Cholelithiasis  - No findings on CMET or CBC concern for acute  cholecystis, no fever, chill or vomiting  - Patient will have RUQ ultrasound for rule on Monday- order in at discharge   Dehydration  - UA positive for signs of dehydration, ketones 15 and increased specific gravity - 2 NS bolus - Counseling about appropriate fluid intake    Asiyah Z Mikell 8/18/20178:53 PM   OB FELLOW MAU DISCHARGE ATTESTATION  I have seen and examined this patient; I agree with above documentation in the resident's note.    Jen MowElizabeth Mumaw, DO OB Fellow 04/13/2016 10:39 PM

## 2016-04-16 DIAGNOSIS — O26849 Uterine size-date discrepancy, unspecified trimester: Secondary | ICD-10-CM | POA: Insufficient documentation

## 2016-04-17 ENCOUNTER — Ambulatory Visit (HOSPITAL_COMMUNITY): Payer: 59

## 2016-04-19 ENCOUNTER — Ambulatory Visit (INDEPENDENT_AMBULATORY_CARE_PROVIDER_SITE_OTHER): Payer: 59 | Admitting: Family

## 2016-04-19 VITALS — BP 115/59 | HR 88 | Wt 288.0 lb

## 2016-04-19 DIAGNOSIS — Z3483 Encounter for supervision of other normal pregnancy, third trimester: Secondary | ICD-10-CM

## 2016-04-19 DIAGNOSIS — R8271 Bacteriuria: Secondary | ICD-10-CM

## 2016-04-19 DIAGNOSIS — Z3493 Encounter for supervision of normal pregnancy, unspecified, third trimester: Secondary | ICD-10-CM

## 2016-04-19 LAB — POCT URINALYSIS DIP (DEVICE)
BILIRUBIN URINE: NEGATIVE
GLUCOSE, UA: NEGATIVE mg/dL
KETONES UR: NEGATIVE mg/dL
Nitrite: NEGATIVE
PROTEIN: 30 mg/dL — AB
SPECIFIC GRAVITY, URINE: 1.025 (ref 1.005–1.030)
Urobilinogen, UA: 1 mg/dL (ref 0.0–1.0)
pH: 6.5 (ref 5.0–8.0)

## 2016-04-19 NOTE — Progress Notes (Signed)
Subjective:  Traci Castro is a 20 y.o. G1P0 at 5321w0d being seen today for ongoing prenatal care.  She is currently monitored for the following issues for this low-risk pregnancy and has Supervision of normal pregnancy in third trimester; BMI 45.0-49.9, adult (HCC); Gestation size to date discrepancy; and Bacteria in urine on her problem list.  Patient reports no complaints.  Contractions: Not present.  .  Movement: Present. Denies leaking of fluid.   The following portions of the patient's history were reviewed and updated as appropriate: allergies, current medications, past family history, past medical history, past social history, past surgical history and problem list. Problem list updated.  Objective:   Vitals:   04/19/16 0836  BP: (!) 115/59  Pulse: 88  Weight: 288 lb (130.6 kg)    Fetal Status: Fetal Heart Rate (bpm): 143 Fundal Height: 37 cm Movement: Present     General:  Alert, oriented and cooperative. Patient is in no acute distress.  Skin: Skin is warm and dry. No rash noted.   Cardiovascular: Normal heart rate noted  Respiratory: Normal respiratory effort, no problems with respiration noted  Abdomen: Soft, gravid, appropriate for gestational age. Pain/Pressure: Present     Pelvic:  Cervical exam deferred        Extremities: Normal range of motion.  Edema: Trace  Mental Status: Normal mood and affect. Normal behavior. Normal judgment and thought content.   Urinalysis: Urine Protein: 1+ Urine Glucose: Negative  Assessment and Plan:  Pregnancy: G1P0 at 2321w0d  1. Supervision of normal pregnancy in third trimester - Signed waterbirth consent today and reviewed contraindications if developed later in pregnancy including the following:  Preterm birth less than 37 weeks, thick, particulate meconium stained fluid, Maternal fever over 101, heavy bleeding or signs of placental abruption, pre-eclampsia, abnormal fetal heart rate pattern, breech presentation, active communicable  infection (this does NOT include group B strep), significant limitation to mobility, or any other condition per provider discretion.  2. Bacteria in urine - Culture, OB Urine  Preterm labor symptoms and general obstetric precautions including but not limited to vaginal bleeding, contractions, leaking of fluid and fetal movement were reviewed in detail with the patient. Please refer to After Visit Summary for other counseling recommendations.  Return in about 1 week (around 04/26/2016).   Eino FarberWalidah Kennith GainN Karim, CNM

## 2016-04-20 ENCOUNTER — Ambulatory Visit (HOSPITAL_COMMUNITY)
Admission: RE | Admit: 2016-04-20 | Discharge: 2016-04-20 | Disposition: A | Discharge status: Home / Self Care | Payer: 59 | Source: Ambulatory Visit | Attending: Obstetrics and Gynecology | Admitting: Obstetrics and Gynecology

## 2016-04-20 ENCOUNTER — Encounter: Payer: Self-pay | Admitting: *Deleted

## 2016-04-20 DIAGNOSIS — R1011 Right upper quadrant pain: Secondary | ICD-10-CM | POA: Insufficient documentation

## 2016-04-20 DIAGNOSIS — R935 Abnormal findings on diagnostic imaging of other abdominal regions, including retroperitoneum: Secondary | ICD-10-CM | POA: Insufficient documentation

## 2016-04-22 LAB — CULTURE, OB URINE

## 2016-04-24 ENCOUNTER — Encounter: Payer: Self-pay | Admitting: Family

## 2016-04-24 DIAGNOSIS — Z22330 Carrier of Group B streptococcus: Secondary | ICD-10-CM | POA: Insufficient documentation

## 2016-04-25 ENCOUNTER — Encounter (HOSPITAL_COMMUNITY): Payer: Self-pay

## 2016-04-25 ENCOUNTER — Inpatient Hospital Stay (HOSPITAL_COMMUNITY)
Admission: AD | Admit: 2016-04-25 | Discharge: 2016-04-25 | Disposition: A | Discharge status: Home / Self Care | Payer: 59 | Source: Ambulatory Visit | Attending: Obstetrics & Gynecology | Admitting: Obstetrics & Gynecology

## 2016-04-25 DIAGNOSIS — B373 Candidiasis of vulva and vagina: Secondary | ICD-10-CM | POA: Diagnosis not present

## 2016-04-25 DIAGNOSIS — O98819 Other maternal infectious and parasitic diseases complicating pregnancy, unspecified trimester: Secondary | ICD-10-CM | POA: Insufficient documentation

## 2016-04-25 DIAGNOSIS — M549 Dorsalgia, unspecified: Secondary | ICD-10-CM | POA: Diagnosis present

## 2016-04-25 DIAGNOSIS — F419 Anxiety disorder, unspecified: Secondary | ICD-10-CM | POA: Insufficient documentation

## 2016-04-25 DIAGNOSIS — Z3A35 35 weeks gestation of pregnancy: Secondary | ICD-10-CM | POA: Diagnosis not present

## 2016-04-25 DIAGNOSIS — O99340 Other mental disorders complicating pregnancy, unspecified trimester: Secondary | ICD-10-CM | POA: Diagnosis not present

## 2016-04-25 DIAGNOSIS — O26893 Other specified pregnancy related conditions, third trimester: Secondary | ICD-10-CM | POA: Diagnosis not present

## 2016-04-25 DIAGNOSIS — R1032 Left lower quadrant pain: Secondary | ICD-10-CM | POA: Diagnosis not present

## 2016-04-25 DIAGNOSIS — B3731 Acute candidiasis of vulva and vagina: Secondary | ICD-10-CM

## 2016-04-25 DIAGNOSIS — Z3493 Encounter for supervision of normal pregnancy, unspecified, third trimester: Secondary | ICD-10-CM

## 2016-04-25 DIAGNOSIS — Z22330 Carrier of Group B streptococcus: Secondary | ICD-10-CM

## 2016-04-25 DIAGNOSIS — Z6841 Body Mass Index (BMI) 40.0 and over, adult: Secondary | ICD-10-CM

## 2016-04-25 LAB — URINALYSIS, ROUTINE W REFLEX MICROSCOPIC
Bilirubin Urine: NEGATIVE
GLUCOSE, UA: NEGATIVE mg/dL
Ketones, ur: NEGATIVE mg/dL
Nitrite: NEGATIVE
PH: 5.5 (ref 5.0–8.0)
PROTEIN: NEGATIVE mg/dL
Specific Gravity, Urine: >1.03 — ABNORMAL HIGH (ref 1.005–1.030)

## 2016-04-25 LAB — URINE MICROSCOPIC-ADD ON

## 2016-04-25 LAB — WET PREP, GENITAL
CLUE CELLS WET PREP: NONE SEEN
Sperm: NONE SEEN
TRICH WET PREP: NONE SEEN

## 2016-04-25 LAB — OB RESULTS CONSOLE GC/CHLAMYDIA: GC PROBE AMP, GENITAL: NEGATIVE

## 2016-04-25 MED ORDER — TERCONAZOLE 0.8 % VA CREA: 1.0000 | TOPICAL_CREAM | Freq: Every day | VAGINAL | 0 refills | Status: AC

## 2016-04-25 MED ORDER — LACTATED RINGERS IV BOLUS (SEPSIS)
1000.0000 mL | Freq: Once | INTRAVENOUS | Status: AC
  Administered 2016-04-25: 1000 mL via INTRAVENOUS

## 2016-04-25 NOTE — MAU Note (Signed)
Feeling pressure in uterus and having some back contractions.  Started on Monday, now maybe every 10 min.

## 2016-04-25 NOTE — MAU Provider Note (Signed)
History     CSN: [id number]  Arrival date and time: 04/25/16 0959   None     Chief Complaint  Patient presents with  . Back Pain   Traci Castro is a 19yo G1P0 at 35.6 here with c/o abd and back pain. States has had mild intermittent b/l lower abd pain that she describes as contractions since Monday occurring q1hour, lasting 2-62min. Then started having more intense contractions starting last night around 11am, constant with associated b/l lower back pain. States back pain is only intermittent. Denies LOF, bleeding, urinary sx, vaginal discharge. Last sexually active 1.5 weeks ago, denies STD exposure. States last checked in office 1 week ago and cervix was closed.    OB History    Gravida Para Term Preterm AB Living   1             SAB TAB Ectopic Multiple Live Births                  Past Medical History:  Diagnosis Date  . Anxiety   . Depression   . Gastritis    2 bouts  . Migraine   . Psoriasis   . Vision abnormalities    wears glasses    Past Surgical History:  Procedure Laterality Date  . ESOPHAGOGASTRODUODENOSCOPY N/A 10/17/2012   Procedure: ESOPHAGOGASTRODUODENOSCOPY (EGD);  Surgeon: Jon Gills, MD;  Location: [facility] OR;  Service: Gastroenterology;  Laterality: N/A;    Family History  Problem Relation Age of Onset  . Cholelithiasis Mother   . Hyperlipidemia Mother   . Hypertension Mother   . Mental illness Mother   . Depression Mother   . Cholelithiasis Maternal Uncle   . Hyperlipidemia Maternal Uncle   . Hypertension Maternal Uncle   . Cholelithiasis Maternal Grandmother   . Arthritis Maternal Grandmother   . COPD Maternal Grandmother   . Depression Maternal Grandmother   . Diabetes Maternal Grandmother   . Heart disease Maternal Grandmother   . Hyperlipidemia Maternal Grandmother   . Hypertension Maternal Grandmother   . Learning disabilities Paternal Grandmother   . Mental illness Paternal Grandmother   . Hyperlipidemia Paternal Grandfather    . Diabetes Paternal Grandfather   . Ulcers Neg Hx   . Alcohol abuse Neg Hx   . Asthma Neg Hx   . Birth defects Neg Hx   . Cancer Neg Hx   . Drug abuse Neg Hx   . Early death Neg Hx   . Hearing loss Neg Hx   . Kidney disease Neg Hx   . Mental retardation Neg Hx   . Miscarriages / Stillbirths Neg Hx   . Stroke Neg Hx   . Vision loss Neg Hx     Social History  Substance Use Topics  . Smoking status: Never Smoker  . Smokeless tobacco: Never Used  . Alcohol use No    Allergies:  Allergies  Allergen Reactions  . Tramadol Nausea And Vomiting    Prescriptions Prior to Admission  Medication Sig Dispense Refill Last Dose  . cetirizine (ZYRTEC) 10 MG tablet Take 1 tablet (10 mg total) by mouth 2 (two) times daily. 60 tablet 5 04/24/2016 at Unknown time  . cyclobenzaprine (FLEXERIL) 5 MG tablet Take 1 tablet (5 mg total) by mouth 3 (three) times daily as needed for muscle spasms. 30 tablet 0 04/24/2016 at Unknown time  . montelukast (SINGULAIR) 10 MG tablet Take 1 tablet (10 mg total) by mouth at bedtime. 30 tablet 5 04/24/2016 at  Unknown time  . ondansetron (ZOFRAN-ODT) 4 MG disintegrating tablet 1 TABLET ON THE TONGUE AND ALLOW TO DISSOLVE THREE TIMES A DAY AS NEEDED ORALLY 30 DAYS  2 04/24/2016 at Unknown time  . Prenatal Vit-Fe Fumarate-FA (PRENATAL MULTIVITAMIN) TABS tablet Take 1 tablet by mouth daily at 12 noon.   04/25/2016 at Unknown time  . acetaminophen (TYLENOL) 500 MG tablet Take 500 mg by mouth every 6 (six) hours as needed for moderate pain. Reported on 02/21/2016   04/23/2016    Review of Systems  Constitutional: Negative for chills and fever.  Eyes: Negative for blurred vision and double vision.  Respiratory: Negative for shortness of breath.   Cardiovascular: Negative for chest pain.  Gastrointestinal: Positive for abdominal pain (b/l lower cramping). Negative for nausea and vomiting.  Genitourinary: Negative for dysuria, flank pain, frequency, hematuria and urgency.   Musculoskeletal: Positive for back pain (b/l lower, greater on R).  Neurological: Negative for headaches.   Physical Exam   Blood pressure 127/74, pulse 94, temperature 98.6 F (37 C), temperature source Oral, resp. rate 18, weight 132.8 kg (292 lb 12 oz), last menstrual period 08/17/2015.  Physical Exam  Constitutional: She is oriented to person, place, and time. She appears well-developed and well-nourished. No distress.  HENT:  Head: Normocephalic and atraumatic.  Eyes: EOM are normal.  Cardiovascular: Normal rate, regular rhythm and normal heart sounds.   No murmur heard. Respiratory: Effort normal and breath sounds normal. No respiratory distress. She has no wheezes.  GI: Soft. Bowel sounds are normal. She exhibits no distension. There is no tenderness (mild b/l lower). There is no rebound and no guarding.  gravid  Neurological: She is alert and oriented to person, place, and time.  Skin: Skin is warm and dry.  Psychiatric: She has a normal mood and affect.    MAU Course  Procedures  MDM: UA showing trace leukocytes, few bacteria, with 6-30 squamous cells Cervical exam 1cm/50/ballotable 1L bolus LR Wet mount: yeast GC/chlamydia collected  Assessment and Plan  IUP @ 35.6 Vaginal yeast infection - terconazole x 3 nights - encouraged good po hydration - given labor precautions and return precautions   Leland HerElsia J Yoo PGY-1 04/25/2016, 11:11 AM    OB FELLOW MAU DISCHARGE ATTESTATION  I have seen and examined this patient; I agree with above documentation in the resident's note.    Jen MowElizabeth Yumalay Circle, DO OB Fellow 8:45 PM

## 2016-04-25 NOTE — Discharge Instructions (Signed)

## 2016-04-26 LAB — GC/CHLAMYDIA PROBE AMP (~~LOC~~) NOT AT ARMC
Chlamydia: NEGATIVE
NEISSERIA GONORRHEA: NEGATIVE

## 2016-05-01 ENCOUNTER — Encounter: Payer: 59 | Admitting: Family Medicine

## 2016-05-01 ENCOUNTER — Encounter (HOSPITAL_COMMUNITY): Payer: Self-pay | Admitting: Family Medicine

## 2016-05-04 ENCOUNTER — Inpatient Hospital Stay (HOSPITAL_COMMUNITY)
Admission: AD | Admit: 2016-05-04 | Discharge: 2016-05-07 | DRG: 775 | Disposition: A | Discharge status: Home / Self Care | Payer: 59 | Source: Ambulatory Visit | Attending: Obstetrics and Gynecology | Admitting: Obstetrics and Gynecology

## 2016-05-04 ENCOUNTER — Ambulatory Visit (INDEPENDENT_AMBULATORY_CARE_PROVIDER_SITE_OTHER): Payer: 59 | Admitting: Obstetrics and Gynecology

## 2016-05-04 ENCOUNTER — Other Ambulatory Visit: Payer: Self-pay | Admitting: Obstetrics and Gynecology

## 2016-05-04 ENCOUNTER — Encounter (HOSPITAL_COMMUNITY): Payer: Self-pay | Admitting: *Deleted

## 2016-05-04 VITALS — BP 131/82 | HR 101 | Wt 297.2 lb

## 2016-05-04 DIAGNOSIS — O99214 Obesity complicating childbirth: Secondary | ICD-10-CM | POA: Diagnosis present

## 2016-05-04 DIAGNOSIS — Z3A37 37 weeks gestation of pregnancy: Secondary | ICD-10-CM | POA: Diagnosis not present

## 2016-05-04 DIAGNOSIS — O4202 Full-term premature rupture of membranes, onset of labor within 24 hours of rupture: Secondary | ICD-10-CM | POA: Diagnosis present

## 2016-05-04 DIAGNOSIS — Z6841 Body Mass Index (BMI) 40.0 and over, adult: Secondary | ICD-10-CM

## 2016-05-04 DIAGNOSIS — E668 Other obesity: Secondary | ICD-10-CM | POA: Diagnosis present

## 2016-05-04 DIAGNOSIS — Z3493 Encounter for supervision of normal pregnancy, unspecified, third trimester: Secondary | ICD-10-CM

## 2016-05-04 DIAGNOSIS — O3663X0 Maternal care for excessive fetal growth, third trimester, not applicable or unspecified: Secondary | ICD-10-CM

## 2016-05-04 DIAGNOSIS — Z22330 Carrier of Group B streptococcus: Secondary | ICD-10-CM

## 2016-05-04 DIAGNOSIS — Z3403 Encounter for supervision of normal first pregnancy, third trimester: Secondary | ICD-10-CM | POA: Diagnosis present

## 2016-05-04 DIAGNOSIS — Z3490 Encounter for supervision of normal pregnancy, unspecified, unspecified trimester: Secondary | ICD-10-CM

## 2016-05-04 DIAGNOSIS — O99824 Streptococcus B carrier state complicating childbirth: Secondary | ICD-10-CM | POA: Diagnosis not present

## 2016-05-04 DIAGNOSIS — Z3483 Encounter for supervision of other normal pregnancy, third trimester: Secondary | ICD-10-CM

## 2016-05-04 DIAGNOSIS — O4212 Full-term premature rupture of membranes, onset of labor more than 24 hours following rupture: Secondary | ICD-10-CM | POA: Diagnosis not present

## 2016-05-04 DIAGNOSIS — O4290 Premature rupture of membranes, unspecified as to length of time between rupture and onset of labor, unspecified weeks of gestation: Secondary | ICD-10-CM | POA: Diagnosis present

## 2016-05-04 DIAGNOSIS — O4210 Premature rupture of membranes, onset of labor more than 24 hours following rupture, unspecified weeks of gestation: Secondary | ICD-10-CM

## 2016-05-04 LAB — POCT URINALYSIS DIP (DEVICE)
BILIRUBIN URINE: NEGATIVE
GLUCOSE, UA: NEGATIVE mg/dL
HGB URINE DIPSTICK: NEGATIVE
LEUKOCYTES UA: NEGATIVE
NITRITE: NEGATIVE
Protein, ur: 100 mg/dL — AB
UROBILINOGEN UA: 1 mg/dL (ref 0.0–1.0)
pH: 6 (ref 5.0–8.0)

## 2016-05-04 LAB — TYPE AND SCREEN
ABO/RH(D): A POS
Antibody Screen: NEGATIVE

## 2016-05-04 LAB — CBC
HCT: 29.7 % — ABNORMAL LOW (ref 36.0–46.0)
Hemoglobin: 9.5 g/dL — ABNORMAL LOW (ref 12.0–15.0)
MCH: 24.4 pg — AB (ref 26.0–34.0)
MCHC: 32 g/dL (ref 30.0–36.0)
MCV: 76.2 fL — AB (ref 78.0–100.0)
PLATELETS: 346 10*3/uL (ref 150–400)
RBC: 3.9 MIL/uL (ref 3.87–5.11)
RDW: 14.8 % (ref 11.5–15.5)
WBC: 8.7 10*3/uL (ref 4.0–10.5)

## 2016-05-04 LAB — OB RESULTS CONSOLE GBS: GBS: POSITIVE

## 2016-05-04 MED ORDER — LIDOCAINE HCL (PF) 1 % IJ SOLN
30.0000 mL | INTRAMUSCULAR | Status: AC | PRN
  Administered 2016-05-05: 30 mL via SUBCUTANEOUS
  Filled 2016-05-04: qty 30

## 2016-05-04 MED ORDER — PENICILLIN G POTASSIUM 5000000 UNITS IJ SOLR
5.0000 10*6.[IU] | Freq: Once | INTRAVENOUS | Status: AC
  Administered 2016-05-04: 5 10*6.[IU] via INTRAVENOUS
  Filled 2016-05-04: qty 5

## 2016-05-04 MED ORDER — OXYTOCIN 40 UNITS IN LACTATED RINGERS INFUSION - SIMPLE MED
1.0000 m[IU]/min | INTRAVENOUS | Status: DC
  Administered 2016-05-04: 2 m[IU]/min via INTRAVENOUS
  Filled 2016-05-04: qty 1000

## 2016-05-04 MED ORDER — LACTATED RINGERS IV SOLN
INTRAVENOUS | Status: DC
  Administered 2016-05-04: 10:00:00 via INTRAVENOUS

## 2016-05-04 MED ORDER — OXYCODONE-ACETAMINOPHEN 5-325 MG PO TABS: 2.0000 | ORAL_TABLET | ORAL | Status: DC | PRN

## 2016-05-04 MED ORDER — OXYTOCIN BOLUS FROM INFUSION: 500.0000 mL | Freq: Once | INTRAVENOUS | Status: DC

## 2016-05-04 MED ORDER — OXYTOCIN 40 UNITS IN LACTATED RINGERS INFUSION - SIMPLE MED: 2.5000 [IU]/h | INTRAVENOUS | Status: DC

## 2016-05-04 MED ORDER — SOD CITRATE-CITRIC ACID 500-334 MG/5ML PO SOLN: 30.0000 mL | ORAL | Status: DC | PRN

## 2016-05-04 MED ORDER — TERBUTALINE SULFATE 1 MG/ML IJ SOLN
0.2500 mg | Freq: Once | INTRAMUSCULAR | Status: DC | PRN
  Filled 2016-05-04: qty 1

## 2016-05-04 MED ORDER — OXYCODONE-ACETAMINOPHEN 5-325 MG PO TABS: 1.0000 | ORAL_TABLET | ORAL | Status: DC | PRN

## 2016-05-04 MED ORDER — LACTATED RINGERS IV SOLN: 500.0000 mL | INTRAVENOUS | Status: DC | PRN

## 2016-05-04 MED ORDER — PENICILLIN G POTASSIUM 5000000 UNITS IJ SOLR
2.5000 10*6.[IU] | INTRAMUSCULAR | Status: DC
  Administered 2016-05-04 – 2016-05-05 (×6): 2.5 10*6.[IU] via INTRAVENOUS
  Filled 2016-05-04 (×10): qty 2.5

## 2016-05-04 MED ORDER — ACETAMINOPHEN 325 MG PO TABS
650.0000 mg | ORAL_TABLET | ORAL | Status: DC | PRN
  Administered 2016-05-06: 650 mg via ORAL
  Filled 2016-05-04: qty 2

## 2016-05-04 MED ORDER — ONDANSETRON HCL 4 MG/2ML IJ SOLN
4.0000 mg | Freq: Four times a day (QID) | INTRAMUSCULAR | Status: DC | PRN
  Administered 2016-05-05: 4 mg via INTRAVENOUS
  Filled 2016-05-04: qty 2

## 2016-05-04 MED ORDER — FLEET ENEMA 7-19 GM/118ML RE ENEM: 1.0000 | ENEMA | RECTAL | Status: DC | PRN

## 2016-05-04 NOTE — Progress Notes (Signed)
PT seen and evaluated. Admitted from office this AM with PROM. HAs had minimal contractions at this time. Difficult to monitor given body habitus. Cervix check unchanged from this AM. Will plan for augmentation at this time. Would consider D/C and see if contractions continue so pt can labor in water if she desires.

## 2016-05-04 NOTE — Anesthesia Pain Management Evaluation Note (Signed)
  CRNA Pain Management Visit Note  Patient: Traci Castro, 20 y.o., female  "Hello I am a member of the anesthesia team at [facility]. We have an anesthesia team available at all times to provide care throughout the hospital, including epidural management and anesthesia for C-section. I don't know your plan for the delivery whether it a natural birth, water birth, IV sedation, nitrous supplementation, doula or epidural, but we want to meet your pain goals."   1.Was your pain managed to your expectations on prior hospitalizations?   No prior hospitalizations  2.What is your expectation for pain management during this hospitalization?     Water tub  3.How can we help you reach that goal? doula  Record the patient's initial score and the patient's pain goal.   Pain: 5  Pain Goal: 10 [facility] wants you to be able to say your pain was always managed very well.  Cephus ShellingBURGER,Traci Castro 05/04/2016

## 2016-05-04 NOTE — H&P (Signed)
Traci Castro is a 20 y.o. female presenting for routine clinic visit but c/o continuous leaking of fluid since yesterday and vaginal pressure. She denied frank ut ctx. + Fm. No VB or recent intercourse. Prenatal course complicated by GBS + urine and maternal obesity. OB History    Gravida Para Term Preterm AB Living   1         0   SAB TAB Ectopic Multiple Live Births                 Past Medical History:  Diagnosis Date  . Anxiety   . Depression   . Gastritis    2 bouts  . Migraine   . Psoriasis   . Vision abnormalities    wears glasses   Past Surgical History:  Procedure Laterality Date  . ESOPHAGOGASTRODUODENOSCOPY N/A 10/17/2012   Procedure: ESOPHAGOGASTRODUODENOSCOPY (EGD);  Surgeon: Jon GillsJoseph H Clark, MD;  [facility];  Service: Gastroenterology;  Laterality: N/A;   Family History: family history includes Arthritis in her maternal grandmother; COPD in her maternal grandmother; Cholelithiasis in her maternal grandmother, maternal uncle, and mother; Depression in her maternal grandmother and mother; Diabetes in her maternal grandmother and paternal grandfather; Heart disease in her maternal grandmother; Hyperlipidemia in her maternal grandmother, maternal uncle, mother, and paternal grandfather; Hypertension in her maternal grandmother, maternal uncle, and mother; Learning disabilities in her paternal grandmother; Mental illness in her mother and paternal grandmother. Social History:  reports that she has never smoked. She has never used smokeless tobacco. She reports that she does not drink alcohol or use drugs.     Maternal Diabetes: No Genetic Screening: Declined Maternal Ultrasounds/Referrals: Normal Fetal Ultrasounds or other Referrals:  None Maternal Substance Abuse:  No Significant Maternal Medications:  None Significant Maternal Lab Results:  None Other Comments:  None  Review of Systems  Constitutional: Negative.   HENT: Negative.   Eyes: Negative.    Respiratory: Negative.   Cardiovascular: Negative.   Gastrointestinal: Negative.   Genitourinary: Negative.   Musculoskeletal: Positive for back pain.   Maternal Medical History:  Reason for admission: Rupture of membranes.   Contractions: Onset was yesterday.   Frequency: irregular.    Fetal activity: Perceived fetal activity is normal.   Last perceived fetal movement was within the past hour.    Prenatal Complications - Diabetes: none.      Last menstrual period 08/17/2015. Maternal Exam:  Uterine Assessment: Contraction frequency is irregular.   Abdomen: Patient reports no abdominal tenderness. Fundal height is 39.   Fetal presentation: vertex  Introitus: Normal vulva. Normal vagina.  Amniotic fluid character: clear.  Cervix: 1/50/-3 Grossly ruptured  Physical Exam  Prenatal labs: ABO, Rh: A/Positive/-- (02/09 0000) Antibody: Negative (02/09 0000) Rubella: Immune (02/09 0000) RPR: Nonreactive (06/22 0000)  HBsAg: Negative (02/09 0000)  HIV: Non-reactive (02/09 0000)  GBS:     Assessment/Plan: IUP 37 1/7 weeks Grossly ruptured Maternal obesity GBS urine +  Pt will admitted for management of SROM. Antibiotic therapy for GBS +. Pain management as per pt. Expectant management.   Traci Castro 05/04/2016, 9:25 AM

## 2016-05-04 NOTE — Progress Notes (Signed)
Subjective:  Traci Castro is a 20 y.o. G1P0 at 6720w1d being seen today for ongoing prenatal care.  She is currently monitored for the following issues for this low-risk pregnancy and has Supervision of normal pregnancy in third trimester; BMI 45.0-49.9, adult (HCC); Gestation size to date discrepancy; and GBS carrier on her problem list.  Patient reports vaginal pressure for the last several days and continous LOF for the past 24 hrs. No VB.Marland Kitchen.  Contractions: Irregular. Vag. Bleeding: None.  Movement: Present. Denies leaking of fluid.   The following portions of the patient's history were reviewed and updated as appropriate: allergies, current medications, past family history, past medical history, past social history, past surgical history and problem list. Problem list updated.  Objective:   Vitals:   05/04/16 0819  BP: 131/82  Pulse: (!) 101  Weight: 134.8 kg (297 lb 3.2 oz)    Fetal Status:     Movement: Present     General:  Alert, oriented and cooperative. Patient is in no acute distress.  Skin: Skin is warm and dry. No rash noted.   Cardiovascular: Normal heart rate noted  Respiratory: Normal respiratory effort, no problems with respiration noted  Abdomen: Soft, gravid, appropriate for gestational age. Pain/Pressure: Present     Pelvic:  Cervical exam performed        Extremities: Normal range of motion.  Edema: None  Mental Status: Normal mood and affect. Normal behavior. Normal judgment and thought content.   Urinalysis:      Assessment and Plan:  Pregnancy: G1P0 at 2020w1d  1. GBS carrier  2. Supervision of normal pregnancy in third trimester  3. BMI 45.0-49.9, adult (HCC)  4. Gestation size to date discrepancy, third trimester, not applicable or unspecified fetus SSE revealed grossly ruptured. Cervical exam as noted. Will direct admit to L & D.   Term labor symptoms and general obstetric precautions including but not limited to vaginal bleeding, contractions, leaking  of fluid and fetal movement were reviewed in detail with the patient. Please refer to After Visit Summary for other counseling recommendations.  Return if symptoms worsen or fail to improve.   Hermina StaggersMichael L Blessing Ozga, MD

## 2016-05-05 ENCOUNTER — Encounter (HOSPITAL_COMMUNITY): Payer: Self-pay

## 2016-05-05 DIAGNOSIS — O99824 Streptococcus B carrier state complicating childbirth: Secondary | ICD-10-CM

## 2016-05-05 DIAGNOSIS — Z3A37 37 weeks gestation of pregnancy: Secondary | ICD-10-CM

## 2016-05-05 DIAGNOSIS — O4212 Full-term premature rupture of membranes, onset of labor more than 24 hours following rupture: Secondary | ICD-10-CM

## 2016-05-05 LAB — RPR: RPR: NONREACTIVE

## 2016-05-05 MED ORDER — WITCH HAZEL-GLYCERIN EX PADS: 1.0000 "application " | MEDICATED_PAD | CUTANEOUS | Status: DC | PRN

## 2016-05-05 MED ORDER — SIMETHICONE 80 MG PO CHEW: 80.0000 mg | CHEWABLE_TABLET | ORAL | Status: DC | PRN

## 2016-05-05 MED ORDER — DIPHENHYDRAMINE HCL 50 MG/ML IJ SOLN: 12.5000 mg | INTRAMUSCULAR | Status: DC | PRN

## 2016-05-05 MED ORDER — ONDANSETRON HCL 4 MG PO TABS: 4.0000 mg | ORAL_TABLET | ORAL | Status: DC | PRN

## 2016-05-05 MED ORDER — IBUPROFEN 600 MG PO TABS
600.0000 mg | ORAL_TABLET | Freq: Four times a day (QID) | ORAL | Status: DC
  Administered 2016-05-05 – 2016-05-07 (×8): 600 mg via ORAL
  Filled 2016-05-05 (×8): qty 1

## 2016-05-05 MED ORDER — EPHEDRINE 5 MG/ML INJ
10.0000 mg | INTRAVENOUS | Status: DC | PRN
  Filled 2016-05-05: qty 4

## 2016-05-05 MED ORDER — SENNOSIDES-DOCUSATE SODIUM 8.6-50 MG PO TABS
2.0000 | ORAL_TABLET | ORAL | Status: DC
  Administered 2016-05-06 (×2): 2 via ORAL
  Filled 2016-05-05 (×2): qty 2

## 2016-05-05 MED ORDER — BENZOCAINE-MENTHOL 20-0.5 % EX AERO
1.0000 "application " | INHALATION_SPRAY | CUTANEOUS | Status: DC | PRN
  Administered 2016-05-05: 1 via TOPICAL
  Filled 2016-05-05: qty 56

## 2016-05-05 MED ORDER — ONDANSETRON HCL 4 MG/2ML IJ SOLN: 4.0000 mg | INTRAMUSCULAR | Status: DC | PRN

## 2016-05-05 MED ORDER — ACETAMINOPHEN 325 MG PO TABS
650.0000 mg | ORAL_TABLET | ORAL | Status: DC | PRN
  Administered 2016-05-05: 650 mg via ORAL
  Filled 2016-05-05: qty 2

## 2016-05-05 MED ORDER — PHENYLEPHRINE 40 MCG/ML (10ML) SYRINGE FOR IV PUSH (FOR BLOOD PRESSURE SUPPORT)
80.0000 ug | PREFILLED_SYRINGE | INTRAVENOUS | Status: DC | PRN
  Filled 2016-05-05: qty 5
  Filled 2016-05-05: qty 10

## 2016-05-05 MED ORDER — ZOLPIDEM TARTRATE 5 MG PO TABS: 5.0000 mg | ORAL_TABLET | Freq: Every evening | ORAL | Status: DC | PRN

## 2016-05-05 MED ORDER — DIBUCAINE 1 % RE OINT
1.0000 | TOPICAL_OINTMENT | RECTAL | Status: DC | PRN
Start: 2016-05-05 — End: 2016-05-07

## 2016-05-05 MED ORDER — PRENATAL MULTIVITAMIN CH
1.0000 | ORAL_TABLET | Freq: Every day | ORAL | Status: DC
  Administered 2016-05-05 – 2016-05-07 (×3): 1 via ORAL
  Filled 2016-05-05 (×3): qty 1

## 2016-05-05 MED ORDER — DIPHENHYDRAMINE HCL 25 MG PO CAPS: 25.0000 mg | ORAL_CAPSULE | Freq: Four times a day (QID) | ORAL | Status: DC | PRN

## 2016-05-05 MED ORDER — FENTANYL CITRATE (PF) 100 MCG/2ML IJ SOLN
INTRAMUSCULAR | Status: AC
  Administered 2016-05-05: 100 ug via INTRAVENOUS
  Filled 2016-05-05: qty 2

## 2016-05-05 MED ORDER — FENTANYL 2.5 MCG/ML BUPIVACAINE 1/10 % EPIDURAL INFUSION (WH - ANES)
14.0000 mL/h | INTRAMUSCULAR | Status: DC | PRN
  Filled 2016-05-05: qty 125

## 2016-05-05 MED ORDER — COCONUT OIL OIL
1.0000 "application " | TOPICAL_OIL | Status: DC | PRN
  Administered 2016-05-07: 1 via TOPICAL
  Filled 2016-05-05: qty 120

## 2016-05-05 MED ORDER — LACTATED RINGERS IV SOLN: 500.0000 mL | Freq: Once | INTRAVENOUS | Status: DC

## 2016-05-05 MED ORDER — PHENYLEPHRINE 40 MCG/ML (10ML) SYRINGE FOR IV PUSH (FOR BLOOD PRESSURE SUPPORT)
80.0000 ug | PREFILLED_SYRINGE | INTRAVENOUS | Status: DC | PRN
  Filled 2016-05-05: qty 5

## 2016-05-05 MED ORDER — FENTANYL CITRATE (PF) 100 MCG/2ML IJ SOLN
100.0000 ug | INTRAMUSCULAR | Status: DC | PRN
  Administered 2016-05-05 (×3): 100 ug via INTRAVENOUS
  Filled 2016-05-05 (×2): qty 2

## 2016-05-05 MED ORDER — TETANUS-DIPHTH-ACELL PERTUSSIS 5-2.5-18.5 LF-MCG/0.5 IM SUSP: 0.5000 mL | Freq: Once | INTRAMUSCULAR | Status: DC

## 2016-05-05 NOTE — Progress Notes (Signed)
Traci Castro is a 20 y.o. G1P1001 at 4178w2d by  admitted for PROM  Subjective: Patient has been laboring with minimal analgesic support, good coaching. Pt requesting that pitocin be discontinued so she can rest better.  Pt very much wants Water birthFamily supportive of pt , with a variety of opinions  . Pt currently on 12 Mu/min pitocin. Contractions timed at q2-3 mins, lasting 30-50 secs. FHR cat I.   Objective: BP 124/63 (BP Location: Right Arm)   Pulse 91   Temp 98.4 F (36.9 C) (Oral)   Resp 18   Ht 5\' 4"  (1.626 m)   Wt 297 lb (134.7 kg)   LMP 08/17/2015   SpO2 100%   Breastfeeding? Unknown   BMI 50.98 kg/m  No intake/output data recorded. Total I/O In: -  Out: 175 [Blood:175]  FHT:  FHR: 145 bpm, variability: moderate,  accelerations:  Present,  decelerations:  Absent UC:   regular, every 2-3 minutes SVE:   By Jvferguson 4-5/20%/-2 with relatively firm cervix , only slight change from exam at expulsion of FB. F  Labs: Lab Results  Component Value Date   WBC 8.7 05/04/2016   HGB 9.5 (L) 05/04/2016   HCT 29.7 (L) 05/04/2016   MCV 76.2 (L) 05/04/2016   PLT 346 05/04/2016    Assessment / Plan: Induction of labor due to PROM,  progressing well on pitocin  Labor: progressing slowly, still in latent phase. Preeclampsia:   Fetal Wellbeing:  Category I Pain Control:  Labor support without medications  To add Nitrous oxide /D:  n/a Anticipated MOD:  NSVD   in order to honor pt wishes, will reduce pitocin to 8 Mu/min to try to space out contractions to make labor more tolerable to pt. Also using Nitrous Pt will still desire the option of water birth be kept open Traci Castro V 05/05/2016, 4:59 PM

## 2016-05-05 NOTE — Lactation Note (Signed)
This note was copied from a baby's chart. Lactation Consultation Note  Patient Name: Traci Castro WUJWJ'XToday's Date: 05/05/2016 Reason for consult: Initial assessment  Baby 8 hours old. Called to assist with latching baby to breast. While positioning mom in the bed, baby became choked and Gma handed the baby to this LC and because she did not think the baby was breathing. Demonstrated to mom and Gma how to turn baby over on one hand and pat the baby's back with the other. The baby had a little fluid drip out of his mouth and was breathing fine.   Mom has large, pendulous breasts with large flat nipples that roll when compressed. Used pillows to position mom more upright in bed and used baby blankets rolled up to support mom's breasts so she could maneuver her breast better and "sandwich" her breasts to make it easier for the baby to latch. Demonstrated hand expression to mom and she was able hand express and dribble about 1 ml of EBM into baby's mouth. Enc mom to do this prior to latching in order to enc baby to latch. Mom is easily expressible with colostrum flowing bilaterally. Baby able to latch to left nipple in football position when hand pump used prior to latching, and suckled off-and-on for a few minutes with a few swallows noted.  Latched baby to right breast in football position and baby able to latch deeply and maintain a deep latch. Baby suckled rhythmically with a few swallows noted. Mom reported uterine cramping while baby nursing on right breast. Baby sleepy at breast even with stimulation. Discussed newborn behavior and milk coming to volume.   Plan is for mom to put baby to breast with cues. Enc using hand pump to evert nipples prior to latching. Also enc hand expression prior to latching. Enc mom to use spoon to feed baby EBM if baby not nursing well--mom reports that her bedside RN, Nehemiah SettleBrooke, demonstrated how to spoon-feed. Mom given a mesh panty "bra" to hold her shells in place, and she  has a sports bra as needed. MGma asked about using a nipple shield. Explained to MOB that it is too early to know if the baby will need a NS as the baby is sleepy but seems to be able to latch fine. Enc mom to keep offering the breast and lots of STS.   Discussed assessment and interventions with Nehemiah SettleBrooke, Charity fundraiserN.  Maternal Data Has patient been taught Hand Expression?: Yes Does the patient have breastfeeding experience prior to this delivery?: No  Feeding Feeding Type: Breast Fed Length of feed: 5 min  LATCH Score/Interventions Latch: Repeated attempts needed to sustain latch, nipple held in mouth throughout feeding, stimulation needed to elicit sucking reflex. Intervention(s): Adjust position;Assist with latch;Breast compression  Audible Swallowing: A few with stimulation (on right breast) Intervention(s): Skin to skin;Hand expression  Type of Nipple: Flat Intervention(s): Shells;Hand pump  Comfort (Breast/Nipple): Soft / non-tender     Hold (Positioning): Assistance needed to correctly position infant at breast and maintain latch. Intervention(s): Breastfeeding basics reviewed;Support Pillows;Position options;Skin to skin  LATCH Score: 6  Lactation Tools Discussed/Used Tools: Shells;Pump;Other (comment) (spoon) Shell Type: Inverted Breast pump type: Manual Pump Review: Setup, frequency, and cleaning Initiated by:: JW Date initiated:: 05/05/16   Consult Status Consult Status: Follow-up Date: 05/06/16 Follow-up type: In-patient    Sherlyn HayJennifer D Aidel Davisson 05/05/2016, 7:11 PM

## 2016-05-06 NOTE — Clinical Social Work Maternal (Signed)
CLINICAL SOCIAL WORK MATERNAL/CHILD NOTE  Patient Details  Name: Traci Castro MRN: [id number] Date of Birth: 03-05-1996  Date:  05/06/2016  Clinical Social Worker Initiating Note:   (Daine Croker lcsw) Date/ Time Initiated:  05/06/16/1206     Child's Name:      Legal Guardian:  Mother   Need for Interpreter:  None   Date of Referral:  04/28/16     Reason for Referral:  Behavioral Health Issues, including SI , Other (Comment) (pt lives in [facility] transitional housing)   Referral Source:  [place]   Address:   ([address] [place] [address])  Phone number:   [phone])   Household Members:  Facility Resident   Natural Supports (not living in the home):  Immediate Family, Spouse/significant other   Professional Supports: Case Manager/Social Worker, Home Care Staff   Employment: Full-time (will begin work when baby is 6wks old)   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  Strengths:  Ability to meet basic needs , Home prepared for child , Pediatrician chosen    Risk Factors/Current Problems:  None   Cognitive State:  Alert , Goal Oriented , Linear Thinking    Mood/Affect:  Anxious , Happy , Interested    CSW Assessment: CSW met with pt who was living at [facility] maternity home/transitional housing.  This is an 45 month program for pregnant/parenting teens and pt has lived there since April.  Pt states that she entered this program because she was homeless and living in a car with FOB when she learned that she was pregnant.  Pt plans on return to [facility] after spending a couple of weeks at her mother's house with her baby.  Mom has secured a job (program requirement) and will begin work in six weeks.  Per pt, [employer] will provide her with childcare while she works.  Pt  reports that the FOB is involved/supportive and is currently living with friends.  Mom is hopeful that they can eventually get into stable housing and rear their son together.  Mom meets regularly with a RN through "child development" who has dicussed PPD, safe sleep, and SIDS with her.  While pt admits that she has had issues with depression/anxiety in the past, she is not currently on any medications and does not receive therapy.  Pt admits that taking care of a newborn increases her anxiety, but believes she is fortunate to have FOB, [employer] and [facility] to help her as she learns to care for her baby.  CSW offered emotional support and validated pt's feelings of anxiety.  Pt has all necessary supplies and equipment to care for baby and believes her mother's home is prepared to receive him.  Pt has already chosen baby's pediatrician as well.  No other social work needs identified.  CSW to sign off, please re-consult as necessary.  CSW Plan/Description:  No Further Intervention Required/No Barriers to Discharge    Linna Caprice, LCSW 05/06/2016, 12:17 PM

## 2016-05-06 NOTE — Progress Notes (Signed)
Post Partum Day 1 Subjective: no complaints, up ad lib, voiding and tolerating PO  Objective: Blood pressure (!) 107/53, pulse 70, temperature 97.4 F (36.3 C), temperature source Oral, resp. rate 18, height 5\' 4"  (1.626 m), weight 297 lb (134.7 kg), last menstrual period 08/17/2015, SpO2 100 %, unknown if currently breastfeeding.  Physical Exam:  General: alert, cooperative, appears stated age and no distress Lochia: appropriate Uterine Fundus: firm Incision: n/a DVT Evaluation: No evidence of DVT seen on physical exam. Negative Homan's sign.   Recent Labs  05/04/16 0935  HGB 9.5*  HCT 29.7*    Assessment/Plan: Plan for discharge tomorrow   LOS: 2 days   Traci Castro 05/06/2016, 9:26 AM

## 2016-05-06 NOTE — Lactation Note (Signed)
This note was copied from a baby's chart. Lactation Consultation Note  Patient Name: Traci Castro ZOXWR'UToday's Date: 05/06/2016 Reason for consult: Follow-up assessment Baby 31 hours old, sleeping on mom's chest. Mom reports that baby is nursing much better now. Mom reports that baby seems to prefer the left breast, but will latch to both. Enc mom to attempt cross-cradle on right breast to see if this improves the latch. Mom states that baby does not seem to be tiring at breast at all, but instead seems to be improving. However, mom reports that baby is still a little spitty. Enc mom to continue to offer lots of STS with baby upright on her chest. Enc mom to call out when baby cueing to nurse if she needs help. Mom reports that her breasts are starting to feel heavier as well.   Maternal Data    Feeding Length of feed: 15 min  LATCH Score/Interventions                      Lactation Tools Discussed/Used     Consult Status Consult Status: Follow-up Date: 05/07/16 Follow-up type: In-patient    Sherlyn HayJennifer D Kyandra Mcclaine 05/06/2016, 6:01 PM

## 2016-05-07 MED ORDER — OXYCODONE-ACETAMINOPHEN 5-325 MG PO TABS: 1.0000 | ORAL_TABLET | ORAL | 0 refills | Status: DC | PRN

## 2016-05-07 MED ORDER — OXYCODONE-ACETAMINOPHEN 5-325 MG PO TABS: 2.0000 | ORAL_TABLET | ORAL | 0 refills | Status: DC | PRN

## 2016-05-07 NOTE — Discharge Summary (Signed)
OB Discharge Summary  Patient Name: Traci Castro DOB: 05/22/1996 MRN: [id number]  Date of admission: 05/04/2016 Delivering MD: Lorne SkeensSCHENK, NICHOLAS MICHAEL   Date of discharge: 05/07/2016  Admitting diagnosis: 37wks, contractions Intrauterine pregnancy: 3663w2d     Secondary diagnosis:Active Problems:   Delayed delivery after SROM (spontaneous rupture of membranes)  Additional problems:none     Discharge diagnosis: Term Pregnancy Delivered                                                                     Post partum procedures:n/a  Augmentation: n/a  Complications: None  Hospital course:  Onset of Labor With Vaginal Delivery     20 y.o. yo G1P1001 at 4663w2d was admitted in Active Labor on 05/04/2016. Patient had an uncomplicated labor course as follows:  Membrane Rupture Time/Date: 8:45 AM ,05/04/2016   Intrapartum Procedures: Episiotomy: None [1]                                         Lacerations:  1st degree [2]  Patient had a delivery of a Viable infant. 05/05/2016  Information for the patient's newborn:  Zenon MayoHanner, Boy Koryn [[id number]][[id number]]  Delivery Method: Vaginal, Spontaneous Delivery (Filed from Delivery Summary)    Pateint had an uncomplicated postpartum course.  She is ambulating, tolerating a regular diet, passing flatus, and urinating well. Patient is discharged home in stable condition on 05/07/16.    Physical exam Vitals:   05/06/16 0040 05/06/16 0541 05/06/16 1738 05/07/16 0540  BP: 118/68 (!) 107/53 116/66 118/66  Pulse: 86 70 83 78  Resp: 18 18 20 18   Temp: 98.1 F (36.7 C) 97.4 F (36.3 C) 98.9 F (37.2 C) 98.1 F (36.7 C)  TempSrc: Oral Oral Oral Oral  SpO2:      Weight:      Height:       General: alert, cooperative and no distress Lochia: appropriate Uterine Fundus: firm Incision: N/A DVT Evaluation: No evidence of DVT seen on physical exam. Negative Homan's sign. Labs: Lab Results  Component Value Date   WBC 8.7 05/04/2016   HGB 9.5 (L)  05/04/2016   HCT 29.7 (L) 05/04/2016   MCV 76.2 (L) 05/04/2016   PLT 346 05/04/2016   CMP Latest Ref Rng & Units 04/13/2016  Glucose 65 - 99 mg/dL 413(K101(H)  BUN 6 - 20 mg/dL 9  Creatinine 4.400.44 - 1.021.00 mg/dL 7.250.72  Sodium 366135 - 440145 mmol/L 134(L)  Potassium 3.5 - 5.1 mmol/L 3.7  Chloride 101 - 111 mmol/L 105  CO2 22 - 32 mmol/L 22  Calcium 8.9 - 10.3 mg/dL 3.4(V8.8(L)  Total Protein 6.5 - 8.1 g/dL 6.5  Total Bilirubin 0.3 - 1.2 mg/dL 4.2(V0.2(L)  Alkaline Phos 38 - 126 U/L 115  AST 15 - 41 U/L 20  ALT 14 - 54 U/L 14    Discharge instruction: per After Visit Summary and "Baby and Me Booklet".    Diet: routine diet  Activity: Advance as tolerated. Pelvic rest for 6 weeks.   Outpatient follow up:6 weeks Follow up Appt:Future Appointments Date Time Provider Department Center  05/09/2016 8:45 AM [facility] US 2  [facility]   Follow up visit: No Follow-up on file.  Postpartum contraception: Undecided  Newborn Data: Live born female  Birth Weight: 7 lb 6.2 oz (3351 g) APGAR: 8, 9  Baby Feeding: Breast Disposition:home with mother   05/07/2016 Wyvonnia Dusky, CNM

## 2016-05-07 NOTE — Discharge Instructions (Signed)

## 2016-05-07 NOTE — Lactation Note (Signed)
This note was copied from a baby's chart. Lactation Consultation Note  Patient Name: Traci Cala BradfordKazual Castro ZOXWR'UToday's Date: 05/07/2016 Reason for consult: Follow-up assessment;Infant weight loss;Other (Comment) (8% weight loss )  Baby is 49 hours old Repeat serum Bili - 9.6 at 48 hours old , D/c written , with F/u tomorrow.  [facility]  Baby latching well , swallow noted this am. And mom reports baby is breast feeding well  With more swallows. Breast are feeling fuller, nipples a little sensitive initially , but improved with breast  Massage , hand express, pre- pump and reverse pressure exercise. Mom also mentioned she has been doing the breast  Compressions. LC recommended due to 8% weight loss , before every feeding after breast massage , hand express, pre-pump, Also stressed the importance of breast compressions with the latch , and during feeding to enhance milk flow to baby.   Grandmother has ordered moms insurance pump this past Sat. While LC in room she checked with insurance company and it  Probably will be in by Friday or next Monday . LC offered the The 2 week rental for $40 flat rate. Grandmother spoke up and said I can't do it today , but on Thursday if needed I can . Mother informed of post-discharge support and given phone number to the lactation department, including services for phone call assistance; out-patient appointments; and breastfeeding support group. List of other breastfeeding resources in the community given in the handout. Encouraged mother to call for problems or concerns related to breastfeeding.  Maternal Data    Feeding Feeding Type: Breast Fed Length of feed: 5 min  LATCH Score/Interventions ( latch [facility] by [facility] and baby also had fed an hour before for 25 mins .  Latch: Repeated attempts needed to sustain latch, nipple held in mouth throughout feeding, stimulation needed to elicit sucking reflex. (latched easily, enc mom insure open mouth) Intervention(s): Breast  compression  Audible Swallowing: A few with stimulation Intervention(s): Skin to skin;Hand expression  Type of Nipple: Everted at rest and after stimulation  Comfort (Breast/Nipple): Soft / non-tender     Hold (Positioning): No assistance needed to correctly position infant at breast. Intervention(s): Breastfeeding basics reviewed  LATCH Score: 8  Lactation Tools Discussed/Used Tools: Shells;Pump Shell Type: Inverted Breast pump type: Manual   Consult Status Consult Status: Follow-up Date: 05/10/16 [facility] Follow-up type: Out-patient    Kathrin Greathouseorio, Lashica Hannay Ann 05/07/2016, 12:19 PM

## 2016-05-09 ENCOUNTER — Ambulatory Visit (HOSPITAL_COMMUNITY): Payer: 59

## 2016-05-09 ENCOUNTER — Encounter: Payer: 59 | Admitting: Obstetrics and Gynecology

## 2016-05-10 ENCOUNTER — Ambulatory Visit (HOSPITAL_COMMUNITY)
Admit: 2016-05-10 | Discharge: 2016-05-10 | Disposition: A | Discharge status: Home / Self Care | Payer: 59 | Attending: Obstetrics and Gynecology | Admitting: Obstetrics and Gynecology

## 2016-05-10 ENCOUNTER — Encounter: Payer: Self-pay | Admitting: Family Medicine

## 2016-05-10 NOTE — Lactation Note (Signed)
Lactation Consult  Mother's reason for visit: infant was [redacted] week gestation. Is at 8% weight loss. Mother is exclusively breastfeeding.  Visit Type:  Feeding assessment   Consult:  Initial Lactation Consultant:  Michel BickersKendrick, Arrie Borrelli McCoy  ________________________________________________________________________    ________________________________________________________________________  Mother's Name: Traci Castro Type of delivery:   Breastfeeding Experience: none Maternal Medical Conditions:  none mother stats she has a history of deperession Maternal Medications:  Prenatal vit  ________________________________________________________________________  Breastfeeding History (Post Discharge)  Frequency of breastfeeding:  Every 2-3 hours  Duration of feeding: 20 mins  Patient does not supplement or pump.  Infant Intake and Output Assessment Voids:  6-8-in 24 hrs.  Color:  Clear yellow Stools:  2-3 in 24 hrs.  Color:  Yellow  ________________________________________________________________________  Maternal Breast Assessment  Breast:  Full Nipple:  Erect Pain level:  0 Pain interventions:  Bra  _______________________________________________________________________ Feeding Assessment/Evaluation  Initial feeding assessment: infant placed in football hold and latched independently. Infant sustained latch for 20-25 mins.   Infant's oral assessment:  WNL  Positioning:  Football Left breast  LATCH documentation:  Latch:  2 = Grasps breast easily, tongue down, lips flanged, rhythmical sucking.  Audible swallowing:  2 = Spontaneous and intermittent  Type of nipple:  2 = Everted at rest and after stimulation  Comfort (Breast/Nipple):  1 = Filling, red/small blisters or bruises, mild/mod discomfort  Hold (Positioning):  1 = Assistance needed to correctly position infant at breast and maintain latch  LATCH score:  8  Attached assessment:  Shallow  Lips flanged:  Yes.     Lips untucked:  Yes.    Suck assessment:  Displays both    Pre-feed weight:3012 Post-feed weight:  3026 Amount transferred: 14 ml    Additional Feeding Assessment - attempt to latch infant on the alternate breast ,but refused too tired.  Infant's oral assessment:  WNL   Total amount transferred:  14 ml   Total supplement given:  16 ml of ebm given with gloved finger and syringe   Mother to continiue to breast feed every 2-3 hours Advised mother to supplement after breast feeding. Suggested that mother offer 30-45 ml of EBM /Formula after each feeding Mother to start pumping after each feeding at least 6-8 times in 24 hours

## 2016-05-16 ENCOUNTER — Ambulatory Visit (HOSPITAL_COMMUNITY): Admission: RE | Admit: 2016-05-16 | Payer: 59 | Source: Ambulatory Visit

## 2016-05-16 ENCOUNTER — Encounter: Payer: 59 | Admitting: Certified Nurse Midwife

## 2016-05-23 ENCOUNTER — Telehealth: Payer: Self-pay | Admitting: *Deleted

## 2016-05-23 NOTE — Telephone Encounter (Signed)
Pt left voice mail stating that she is having headaches and nausea. Would like a call back to discuss. Called patient back and left message that we are returning her call.

## 2016-05-24 ENCOUNTER — Telehealth (HOSPITAL_COMMUNITY): Payer: Self-pay | Admitting: Lactation Services

## 2016-05-24 NOTE — Telephone Encounter (Signed)
Patient has spoken with lactation about her milk supply and they recommended fenugreek and she feels like this is helping. However she continues to have headache. This started on Tuesday with nausea, h/a and nausea continued through Wednesday but today she is no longer nauseous. She has tried naprosyn and tylenol for her h/a but it hasn't worked. States hx of migraines. No blurred vision or edema, no abd pain. Per Dr Adrian BlackwaterStinson, if patient continues to have headache she can come to [facility] to eval. Patient voiced understanding states that she has no ride today but if the headache isn't gone by tomorrow she will come in.

## 2016-05-24 NOTE — Telephone Encounter (Signed)
Patient left message stating she is breastfeeding and her milk supply has decreased.

## 2016-05-24 NOTE — Telephone Encounter (Signed)
Mother states her milk supply has decreased.

## 2016-05-29 ENCOUNTER — Ambulatory Visit (HOSPITAL_COMMUNITY)
Admission: RE | Admit: 2016-05-29 | Discharge: 2016-05-29 | Disposition: A | Discharge status: Home / Self Care | Payer: 59 | Source: Ambulatory Visit | Attending: Obstetrics and Gynecology | Admitting: Obstetrics and Gynecology

## 2016-05-29 NOTE — Lactation Note (Signed)
Lactation Consult  Mother's reason for visit:  Review feeding plan and check latch Visit Type:  Outpatient Appointment Notes:  Baby was born at 48 weeks and is now 63 weeks old.  Baby breastfeeds on demand approx every 2 hours for 20 min.  Baby has regained birth weight but recently went for 1 week with numerous voids but no stool.  Pediatrician DeClaire recommended giving baby apple juice.  Baby stooled for first time in one week last night.  Discussed increasing pumping to 4 times per day and giving baby additional breastmilk.  Call if mother has future concerns regarding voids/stools.  Discussed that it is WNL for 4-6 week or older breastfed infant to go a week without a stool as long as baby is gaining weight.  Discussed breastfed babies should gain 1/2 oz - 1 oz per day.  Encouraged breastfeeding on both breasts.  If baby is too sleepy to breastfed on second breast, give him supplemental breastmilk.  Continue to breastfeed on demand.  Post pump 4 times per day for 15-20 min both breasts at the same time if possible.  Give baby pumped breastmilk at next feeding.  Monitor voids/stools.  Fenugreek 3 capsules 3 times per day for a total of 9 capsules.  (Mother concerned about milk supply) But she is pumping at time 3-5 oz per session. Encouraged her and reassured her. Consult:  Follow-Up Lactation Consultant:  Traci Castro  ________________________________________________________________________ Traci Castro Name:  Traci Castro Date of Birth:  05/05/2016 Pediatrician:  Vonna Kotyk Gender:  female Gestational Age: [redacted]w[redacted]d (At Birth) Birth Weight:  7 lb 6.2 oz (3351 g) Weight at Discharge:  Weight: 6 lb 13 oz (3090 g)                Date of Discharge:  05/07/2016      Filed Weights   05/05/16 1047 05/05/16 2330 05/06/16 2345  Weight: 7 lb 6.2 oz (3351 g) 7 lb 3.7 oz (3280 g) 6 lb 13 oz (3090 g)  Last weight taken from location [facility] of [facility]:  8 lb 2.5 oz      Location:Pediatrician's office Weight today:  8 lb 1.9 oz. ________________________________________________________________________  Mother's Name: Traci Castro Type of delivery:   Breastfeeding Experience:  Primip Maternal Medications:  Aleve, stool softner and PNV ________________________________________________________________________  Breastfeeding History (Post Discharge)  Frequency of breastfeeding:  Every 2 hours Duration of feeding:  20 min    Pumping  Type of pump:  Medela pump in style Frequency:  1-2 times per day Volume:  3-5 oz   Infant Intake and Output Assessment  Voids:  12 in 24 hrs.  Color:  Clear yellow Stools:  2 in 24 hrs.  Color:  Yellow  ________________________________________________________________________  Maternal Breast Assessment  Breast:  Soft Nipple:  Erect Pain level:  0 _______________________________________________________________________ Feeding Assessment/Evaluation  Initial feeding assessment:  Infant's oral assessment:  WNL  Positioning:  Cross cradle Right breast  LATCH documentation:  Latch:  2 = Grasps breast easily, tongue down, lips flanged, rhythmical sucking.  Audible swallowing:  2 = Spontaneous and intermittent  Type of nipple:  2 = Everted at rest and after stimulation  Comfort (Breast/Nipple):  2 = Soft / non-tender  Hold (Positioning):  1 = Assistance needed to correctly position infant at breast and maintain latch  LATCH score:  8  Attached assessment:  Deep  Lips flanged:  Yes.    Lips untucked:  No.  Suck assessment:  Displays both  Tools:  Pump Instructed on use and cleaning of tool:  Yes.    Pre-feed weight:  3682 g  (8 lb. 1.9 oz.) Post-feed weight:  3720 g (8 lb. 2.2 oz.) Amount transferred:  38 ml Amount supplemented:  0 ml  Additional Feeding Assessment -   Infant's oral assessment:  WNL  Positioning:  Football Left breast  LATCH documentation:  Latch:  2 = Grasps breast easily,  tongue down, lips flanged, rhythmical sucking.  Audible swallowing:  1 = A few with stimulation  Type of nipple:  2 = Everted at rest and after stimulation  Comfort (Breast/Nipple):  2 = Soft / non-tender  Hold (Positioning):  1 = Assistance needed to correctly position infant at breast and maintain latch  LATCH score:  8  Attached assessment:  Deep  Lips flanged:  Yes.    Lips untucked:  No.  Suck assessment:  Displays both  Tools:  No tools needed other than pump Instructed on use and cleaning of tool: NA  Pre-feed weight:  3720 g  (8 lb. 2.2 oz.) Post-feed weight:  3724 g (8 lb. 3.3 oz.) Amount transferred:  4 ml Amount supplemented:  60 ml  breastmilk  Total amount transferred:  42 ml Total supplement given:  60 ml

## 2016-06-14 ENCOUNTER — Ambulatory Visit: Payer: 59 | Admitting: Obstetrics and Gynecology

## 2016-07-04 ENCOUNTER — Encounter: Payer: Self-pay | Admitting: *Deleted

## 2016-07-04 ENCOUNTER — Encounter (HOSPITAL_COMMUNITY): Payer: Self-pay | Admitting: Emergency Medicine

## 2016-07-04 ENCOUNTER — Emergency Department (HOSPITAL_COMMUNITY)
Admission: EM | Admit: 2016-07-04 | Discharge: 2016-07-04 | Disposition: A | Discharge status: Home / Self Care | Payer: 59 | Attending: Physician Assistant | Admitting: Physician Assistant

## 2016-07-04 ENCOUNTER — Ambulatory Visit: Payer: 59 | Admitting: Obstetrics and Gynecology

## 2016-07-04 DIAGNOSIS — R0981 Nasal congestion: Secondary | ICD-10-CM | POA: Diagnosis present

## 2016-07-04 DIAGNOSIS — J069 Acute upper respiratory infection, unspecified: Secondary | ICD-10-CM | POA: Diagnosis not present

## 2016-07-04 DIAGNOSIS — B9789 Other viral agents as the cause of diseases classified elsewhere: Secondary | ICD-10-CM

## 2016-07-04 NOTE — ED Triage Notes (Signed)
Pt has been having cough, nasal congestion for a few days. Pt denies fever.

## 2016-07-04 NOTE — ED Provider Notes (Signed)
[facility]-EMERGENCY DEPT Provider Note   CSN: [id number] Arrival date & time: 07/04/16  1557  By signing my name below, I, Sonum Patel, attest that this documentation has been prepared under the direction and in the presence of [employer]. Electronically Signed: Sonum Patel, Neurosurgeon. 07/04/16. 4:48 PM.  History   Chief Complaint Chief Complaint  Patient presents with  . Nasal Congestion  . Cough    The history is provided by the patient. No language interpreter was used.     HPI Comments: Traci Castro is a 20 y.o. female who presents to the Emergency Department complaining of gradual onset, constant generalized myalgias that began 1 week ago. She reports associated nasal congestion, sore throat, and cough productive of yellow sputum. She has taken Delsym cough syrup but states she has been cautious with medications due to breastfeeding. She denies fever, sinus pressure, ear pain.   Past Medical History:  Diagnosis Date  . Anxiety   . Depression   . Gastritis    2 bouts  . Migraine   . Psoriasis   . Vision abnormalities    wears glasses    Patient Active Problem List   Diagnosis Date Noted  . Delayed delivery after SROM (spontaneous rupture of membranes) 05/04/2016  . 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 Gestation size to date discrepancy 04/16/2016  . Supervision of normal pregnancy in third trimester 03/15/2016  . BMI 45.0-49.9, adult (HCC) 03/15/2016    Past Surgical History:  Procedure Laterality Date  . ESOPHAGOGASTRODUODENOSCOPY N/A 10/17/2012   Procedure: ESOPHAGOGASTRODUODENOSCOPY (EGD);  Surgeon: Jon Gills, MD;  Location: [facility] OR;  Service: Gastroenterology;  Laterality: N/A;    OB History    Gravida Para Term Preterm AB Living   1 1 1     1    SAB TAB Ectopic Multiple Live Births         0 1       Home Medications    Prior to Admission medications   Medication Sig Start Date End Date Taking? Authorizing Provider  cyclobenzaprine (FLEXERIL) 5 MG tablet  Take 1 tablet (5 mg total) by mouth 3 (three) times daily as needed for muscle spasms. Patient not taking: Reported on 05/04/2016 04/13/16   Asiyah Mayra Reel, MD  montelukast (SINGULAIR) 10 MG tablet Take 1 tablet (10 mg total) by mouth at bedtime. Patient not taking: Reported on 05/04/2016 02/21/16   Jessica Priest, MD  Prenatal Vit-Fe Fumarate-FA (PRENATAL MULTIVITAMIN) TABS tablet Take 1 tablet by mouth daily at 12 noon.    Historical Provider, MD    Family History Family History  Problem Relation Age of Onset  . Cholelithiasis Mother   . Hyperlipidemia Mother   . Hypertension Mother   . Mental illness Mother   . Depression Mother   . Cholelithiasis Maternal Uncle   . Hyperlipidemia Maternal Uncle   . Hypertension Maternal Uncle   . Cholelithiasis Maternal Grandmother   . Arthritis Maternal Grandmother   . COPD Maternal Grandmother   . Depression Maternal Grandmother   . Diabetes Maternal Grandmother   . Heart disease Maternal Grandmother   . Hyperlipidemia Maternal Grandmother   . Hypertension Maternal Grandmother   . Learning disabilities Paternal Grandmother   . Mental illness Paternal Grandmother   . Hyperlipidemia Paternal Grandfather   . Diabetes Paternal Grandfather   . Ulcers Neg Hx   . Alcohol abuse Neg Hx   . Asthma Neg Hx   . Birth defects Neg Hx   . Cancer  Neg Hx   . Drug abuse Neg Hx   . Early death Neg Hx   . Hearing loss Neg Hx   . Kidney disease Neg Hx   . Mental retardation Neg Hx   . Miscarriages / Stillbirths Neg Hx   . Stroke Neg Hx   . Vision loss Neg Hx     Social History Social History  Substance Use Topics  . Smoking status: Never Smoker  . Smokeless tobacco: Never Used  . Alcohol use No     Allergies   Tramadol   Review of Systems Review of Systems  Constitutional: Negative for fever.  HENT: Positive for congestion and sore throat. Negative for ear pain, sinus pain and sinus pressure.   Respiratory: Positive for cough.     Musculoskeletal: Positive for myalgias.     Physical Exam Updated Vital Signs BP 148/71 (BP Location: Right Arm)   Pulse 75   Temp 98.3 F (36.8 C) (Oral)   Resp 17   SpO2 99%   Physical Exam  Constitutional: She is oriented to person, place, and time. She appears well-developed and well-nourished. No distress.  HENT:  Head: Normocephalic and atraumatic.  Right Ear: Tympanic membrane, external ear and ear canal normal.  Left Ear: Tympanic membrane, external ear and ear canal normal.  Nose: Nose normal.  Mouth/Throat: Oropharynx is clear and moist. No oropharyngeal exudate.  NO frontal or maxillary sinus tenderness  Eyes: Conjunctivae and EOM are normal. Pupils are equal, round, and reactive to light. Right eye exhibits no discharge. Left eye exhibits no discharge. No scleral icterus.  Neck: Neck supple.  Cardiovascular: Normal rate, regular rhythm and normal heart sounds.  Exam reveals no gallop and no friction rub.   No murmur heard. Pulmonary/Chest: Effort normal and breath sounds normal. No respiratory distress. She has no wheezes. She has no rales.  Lymphadenopathy:    She has no cervical adenopathy.  Neurological: She is alert and oriented to person, place, and time. Coordination normal.  Skin: Skin is warm and dry. No rash noted. She is not diaphoretic. No erythema. No pallor.  Psychiatric: She has a normal mood and affect. Her behavior is normal.  Nursing note and vitals reviewed.    ED Treatments / Results  DIAGNOSTIC STUDIES: Oxygen Saturation is 99% on RA, normal by my interpretation.    COORDINATION OF CARE: 4:26 PM Discussed treatment plan with pt at bedside and pt agreed to plan.  4:35 PM Spoke with Pharmacy regarding safety of taking OTC cough medication and breast feeding.   Labs (all labs ordered are listed, but only abnormal results are displayed) Labs Reviewed - No data to display  EKG  EKG Interpretation None       Radiology No results  found.  Procedures Procedures (including critical care time)  Medications Ordered in ED Medications - No data to display   Initial Impression / Assessment and Plan / ED Course  I have reviewed the triage vital signs and the nursing notes.  Pertinent labs & imaging results that were available during my care of the patient were reviewed by me and considered in my medical decision making (see chart for details).  Clinical Course     Pt symptoms consistent with URI, likely viral in etiology. Doubt PNA. Lungs CTAB. VSS in ED. Pt Afebrile. Spoke with pharmacy regarding safety of symptomatic therapy in breastfeeding, no good data to suggest safety. Pt will be discharged with symptomatic treatment recommendations including, vitamin C, hot tea and  honey for cough.  Discussed return precautions.  Follow up with PCP. Pt is hemodynamically stable & in NAD prior to discharge.   Final Clinical Impressions(s) / ED Diagnoses   Final diagnoses:  Viral URI with cough    New Prescriptions New Prescriptions   No medications on file   I personally performed the services described in this documentation, which was scribed in my presence. The recorded information has been reviewed and is accurate.      Lester KinsmanSamantha Tripp CassodayDowless, PA-C 07/04/16 1723    Courteney Randall AnLyn Mackuen, MD 07/05/16 [phone]

## 2016-07-04 NOTE — Discharge Instructions (Signed)
Recommend increase vitamin C and zinc intake i.e Emergen-C. Drink plenty of fluids. Take ibuprofen as needed for body aches. Drink hot tea and encourage nasal rinsing to help with decongestion. Follow up with your primary care provider if symptoms do not improve.

## 2016-07-04 NOTE — Progress Notes (Signed)
Traci Castro missed a scheduled appointment for postpartum.  Will send letter to notify patient and she may reschedule if desired.

## 2016-07-04 NOTE — ED Notes (Signed)
Pt reports non-productive cough and congestion x 2 weeks.  Pt in NAD.

## 2016-08-01 ENCOUNTER — Ambulatory Visit (INDEPENDENT_AMBULATORY_CARE_PROVIDER_SITE_OTHER): Payer: 59 | Admitting: Advanced Practice Midwife

## 2016-08-01 ENCOUNTER — Encounter: Payer: Self-pay | Admitting: Advanced Practice Midwife

## 2016-08-01 DIAGNOSIS — Z30011 Encounter for initial prescription of contraceptive pills: Secondary | ICD-10-CM

## 2016-08-01 MED ORDER — NORGESTIMATE-ETH ESTRADIOL 0.25-35 MG-MCG PO TABS: 1.0000 | ORAL_TABLET | Freq: Every day | ORAL | 11 refills | Status: DC

## 2016-08-01 NOTE — Patient Instructions (Signed)
Oral Contraception Use Oral contraceptive pills (OCPs) are medicines taken to prevent pregnancy. OCPs work by preventing the ovaries from releasing eggs. The hormones in OCPs also cause the cervical mucus to thicken, preventing the sperm from entering the uterus. The hormones also cause the uterine lining to become thin, not allowing a fertilized egg to attach to the inside of the uterus. OCPs are highly effective when taken exactly as prescribed. However, OCPs do not prevent sexually transmitted diseases (STDs). Safe sex practices, such as using condoms along with an OCP, can help prevent STDs. Before taking OCPs, you may have a physical exam and Pap test. Your health care provider may also order blood tests if necessary. Your health care provider will make sure you are a good candidate for oral contraception. Discuss with your health care provider the possible side effects of the OCP you may be prescribed. When starting an OCP, it can take 2 to 3 months for the body to adjust to the changes in hormone levels in your body. How to take oral contraceptive pills Your health care provider may advise you on how to start taking the first cycle of OCPs. Otherwise, you can:  Start on day 1 of your menstrual period. You will not need any backup contraceptive protection with this start time.  Start on the first Sunday after your menstrual period or the day you get your prescription. In these cases, you will need to use backup contraceptive protection for the first week.  Start the pill at any time of your cycle. If you take the pill within 5 days of the start of your period, you are protected against pregnancy right away. In this case, you will not need a backup form of birth control. If you start at any other time of your menstrual cycle, you will need to use another form of birth control for 7 days. If your OCP is the type called a minipill, it will protect you from pregnancy after taking it for 2 days (48  hours).  After you have started taking OCPs:  If you forget to take 1 pill, take it as soon as you remember. Take the next pill at the regular time.  If you miss 2 or more pills, call your health care provider because different pills have different instructions for missed doses. Use backup birth control until your next menstrual period starts.  If you use a 28-day pack that contains inactive pills and you miss 1 of the last 7 pills (pills with no hormones), it will not matter. Throw away the rest of the non-hormone pills and start a new pill pack.  No matter which day you start the OCP, you will always start a new pack on that same day of the week. Have an extra pack of OCPs and a backup contraceptive method available in case you miss some pills or lose your OCP pack. Follow these instructions at home:  Do not smoke.  Always use a condom to protect against STDs. OCPs do not protect against STDs.  Use a calendar to mark your menstrual period days.  Read the information and directions that came with your OCP. Talk to your health care provider if you have questions. Contact a health care provider if:  You develop nausea and vomiting.  You have abnormal vaginal discharge or bleeding.  You develop a rash.  You miss your menstrual period.  You are losing your hair.  You need treatment for mood swings or depression.  You   get dizzy when taking the OCP.  You develop acne from taking the OCP.  You become pregnant. Get help right away if:  You develop chest pain.  You develop shortness of breath.  You have an uncontrolled or severe headache.  You develop numbness or slurred speech.  You develop visual problems.  You develop pain, redness, and swelling in the legs. This information is not intended to replace advice given to you by your health care provider. Make sure you discuss any questions you have with your health care provider. Document Released: 08/02/2011 Document  Revised: 01/19/2016 Document Reviewed: 02/01/2013 Elsevier Interactive Patient Education  2017 [employer] Inc[employer]  

## 2016-08-01 NOTE — Progress Notes (Signed)
Subjective:     Traci Castro is a 20 y.o. female who presents for a postpartum visit. She is 12 weeks postpartum following a spontaneous vaginal delivery. I have fully reviewed the prenatal and intrapartum course. The delivery was at 37 gestational weeks. Outcome: spontaneous vaginal delivery. Anesthesia: local. Postpartum course has been uneventful. Baby's course has been uneventful. Baby is feeding by bottle - Similac Sensitive RS. Bleeding no bleeding. Bowel function is normal. Bladder function is normal. Patient is not sexually active. Contraception method is OCP (estrogen/progesterone). Postpartum depression screening: negative.  The following portions of the patient's history were reviewed and updated as appropriate: allergies, current medications, past family history, past medical history, past social history, past surgical history and problem list.  Review of Systems Pertinent items are noted in HPI.   Objective:    There were no vitals taken for this visit.  General:  alert and no distress   Breasts:  inspection negative, no nipple discharge or bleeding, no masses or nodularity palpable  Lungs: clear to auscultation bilaterally  Heart:  regular rate and rhythm, S1, S2 normal, no murmur, click, rub or gallop  Abdomen: soft, non-tender; bowel sounds normal; no masses,  no organomegaly   Vulva:  not evaluated  Vagina: not evaluated  Cervix:  n/a  Corpus: not examined  Adnexa:  not evaluated  Rectal Exam: Not performed.        Assessment:     Normal  postpartum exam. Pap smear not done at today's visit.   Plan:    1. Contraception: OCP (estrogen/progesterone) 2. Rx sent for Sprintec.  No hypertension or smoking    Has taken before. Knows how.  3. Follow up in: 1 month or as needed.

## 2016-12-29 ENCOUNTER — Emergency Department (HOSPITAL_COMMUNITY)
Admission: EM | Admit: 2016-12-29 | Discharge: 2016-12-29 | Disposition: A | Discharge status: Home / Self Care | Payer: 59 | Attending: Emergency Medicine | Admitting: Emergency Medicine

## 2016-12-29 DIAGNOSIS — J3089 Other allergic rhinitis: Secondary | ICD-10-CM

## 2016-12-29 DIAGNOSIS — Z79899 Other long term (current) drug therapy: Secondary | ICD-10-CM | POA: Insufficient documentation

## 2016-12-29 DIAGNOSIS — R112 Nausea with vomiting, unspecified: Secondary | ICD-10-CM | POA: Diagnosis not present

## 2016-12-29 DIAGNOSIS — J00 Acute nasopharyngitis [common cold]: Secondary | ICD-10-CM

## 2016-12-29 DIAGNOSIS — J302 Other seasonal allergic rhinitis: Secondary | ICD-10-CM | POA: Insufficient documentation

## 2016-12-29 DIAGNOSIS — R51 Headache: Secondary | ICD-10-CM | POA: Insufficient documentation

## 2016-12-29 DIAGNOSIS — R748 Abnormal levels of other serum enzymes: Secondary | ICD-10-CM

## 2016-12-29 DIAGNOSIS — R519 Headache, unspecified: Secondary | ICD-10-CM

## 2016-12-29 LAB — CBC WITH DIFFERENTIAL/PLATELET
BASOS ABS: 0 10*3/uL (ref 0.0–0.1)
Basophils Relative: 1 %
EOS ABS: 0.3 10*3/uL (ref 0.0–0.7)
EOS PCT: 4 %
HCT: 39.8 % (ref 36.0–46.0)
Hemoglobin: 11.8 g/dL — ABNORMAL LOW (ref 12.0–15.0)
LYMPHS ABS: 3.4 10*3/uL (ref 0.7–4.0)
LYMPHS PCT: 41 %
MCH: 22.6 pg — ABNORMAL LOW (ref 26.0–34.0)
MCHC: 29.6 g/dL — ABNORMAL LOW (ref 30.0–36.0)
MCV: 76.1 fL — ABNORMAL LOW (ref 78.0–100.0)
MONO ABS: 0.5 10*3/uL (ref 0.1–1.0)
Monocytes Relative: 6 %
Neutro Abs: 4.1 10*3/uL (ref 1.7–7.7)
Neutrophils Relative %: 48 %
PLATELETS: 336 10*3/uL (ref 150–400)
RBC: 5.23 MIL/uL — ABNORMAL HIGH (ref 3.87–5.11)
RDW: 16.2 % — AB (ref 11.5–15.5)
WBC: 8.4 10*3/uL (ref 4.0–10.5)

## 2016-12-29 LAB — COMPREHENSIVE METABOLIC PANEL
ALBUMIN: 4.6 g/dL (ref 3.5–5.0)
ALT: 24 U/L (ref 14–54)
ANION GAP: 6 (ref 5–15)
AST: 21 U/L (ref 15–41)
Alkaline Phosphatase: 96 U/L (ref 38–126)
BUN: 16 mg/dL (ref 6–20)
CHLORIDE: 104 mmol/L (ref 101–111)
CO2: 27 mmol/L (ref 22–32)
Calcium: 8.9 mg/dL (ref 8.9–10.3)
Creatinine, Ser: 0.78 mg/dL (ref 0.44–1.00)
GFR calc Af Amer: >60 mL/min (ref >60)
GFR calc non Af Amer: >60 mL/min (ref >60)
GLUCOSE: 100 mg/dL — AB (ref 65–99)
POTASSIUM: 4.1 mmol/L (ref 3.5–5.1)
SODIUM: 137 mmol/L (ref 135–145)
Total Bilirubin: 0.2 mg/dL — ABNORMAL LOW (ref 0.3–1.2)
Total Protein: 7.7 g/dL (ref 6.5–8.1)

## 2016-12-29 LAB — URINALYSIS, ROUTINE W REFLEX MICROSCOPIC
Bilirubin Urine: NEGATIVE
GLUCOSE, UA: NEGATIVE mg/dL
Hgb urine dipstick: NEGATIVE
Ketones, ur: NEGATIVE mg/dL
LEUKOCYTES UA: NEGATIVE
Nitrite: NEGATIVE
PH: 7 (ref 5.0–8.0)
PROTEIN: NEGATIVE mg/dL
SPECIFIC GRAVITY, URINE: 1.028 (ref 1.005–1.030)

## 2016-12-29 LAB — I-STAT BETA HCG BLOOD, ED (MC, WL, AP ONLY): I-stat hCG, quantitative: <5 m[IU]/mL (ref <5)

## 2016-12-29 LAB — LIPASE, BLOOD: LIPASE: 54 U/L — AB (ref 11–51)

## 2016-12-29 MED ORDER — NAPROXEN 250 MG PO TABS: 250.0000 mg | ORAL_TABLET | Freq: Two times a day (BID) | ORAL | 0 refills | Status: DC

## 2016-12-29 MED ORDER — ONDANSETRON 4 MG PO TBDP: 4.0000 mg | ORAL_TABLET | Freq: Three times a day (TID) | ORAL | 0 refills | Status: DC | PRN

## 2016-12-29 MED ORDER — DIPHENHYDRAMINE HCL 50 MG/ML IJ SOLN
25.0000 mg | Freq: Once | INTRAMUSCULAR | Status: AC
  Administered 2016-12-29: 25 mg via INTRAVENOUS
  Filled 2016-12-29: qty 1

## 2016-12-29 MED ORDER — FLUTICASONE PROPIONATE 50 MCG/ACT NA SUSP: 2.0000 | Freq: Every day | NASAL | 0 refills | Status: DC

## 2016-12-29 MED ORDER — METOCLOPRAMIDE HCL 5 MG/ML IJ SOLN
10.0000 mg | Freq: Once | INTRAMUSCULAR | Status: AC
  Administered 2016-12-29: 10 mg via INTRAVENOUS
  Filled 2016-12-29: qty 2

## 2016-12-29 MED ORDER — CETIRIZINE HCL 10 MG PO TABS: 10.0000 mg | ORAL_TABLET | Freq: Every day | ORAL | 1 refills | Status: DC

## 2016-12-29 NOTE — ED Triage Notes (Signed)
Pt c/o headache with lower back pain since today. Pt lost voice yesterday and has hoarseness . Pt also vomited once today. Hx of migraines.

## 2016-12-29 NOTE — ED Provider Notes (Signed)
[facility]-EMERGENCY DEPT Provider Note   CSN: [id number] Arrival date & time: 12/29/16  1922 By signing my name below, I, Drue Flirt, attest that this documentation has been prepared under the direction and in the presence of non physician provider,Ariadna Setter, PA-C  . Electronically Signed: Drue Flirt, Scribe. 12/29/2016. 8:03 PM   History   Chief Complaint Chief Complaint  Patient presents with  . Headache    HPI  Traci Castro is a 21 y.o. female with a history of migraines who presents to the Emergency Department complaining of generalized headache which began this morning when she woke up. Pt currently rates this as 8/10 pain and states it feels similar to previous migraines. She reports associated hoarse voice, nausea, vomiting, rhinorrhea, postnasal drip, congestion and photophobia. She tried Dayquil and Mucinex with no reported relief; no other medications taken PTA.  Per pt, her symptoms began yesterday with a sore throat and hoarse voice. She reports vomiting once today PTA. She reports having post nasal drip and believes this made her nauseated and caused her to vomit. She denies abdominal pain. She denies alcohol use. Hasn't had her menstrual cycle in 2 months and is unsure if she is pregnant. She takes birth control pills, but does not use barrier protection. She is sexually active. She denies any fever, abdominal pain, diarrhea, vaginal pain or discharge, vaginal bleeding, SOB, dysuria, urgency, ear pressure or popping sensation, increased urinary frequency, hematuria, neck pain or stiffness, numbness, or tingling.  No abdominal SHx.   The history is provided by the patient. No language interpreter was used.    Past Medical History:  Diagnosis Date  . Anxiety   . Depression   . Gastritis    2 bouts  . Migraine   . Psoriasis   . Vision abnormalities    wears glasses    Patient Active Problem List   Diagnosis Date Noted  . Gestation size to date discrepancy  04/16/2016  . BMI 45.0-49.9, adult (HCC) 03/15/2016    Past Surgical History:  Procedure Laterality Date  . ESOPHAGOGASTRODUODENOSCOPY N/A 10/17/2012   Procedure: ESOPHAGOGASTRODUODENOSCOPY (EGD);  Surgeon: Jon Gills, MD;  Location: [facility] OR;  Service: Gastroenterology;  Laterality: N/A;    OB History    Gravida Para Term Preterm AB Living   1 1 1     1    SAB TAB Ectopic Multiple Live Births         0 1      Home Medications    Prior to Admission medications   Medication Sig Start Date End Date Taking? Authorizing Provider  cetirizine (ZYRTEC ALLERGY) 10 MG tablet Take 1 tablet (10 mg total) by mouth daily. 12/29/16   Everlene Farrier, PA-C  cyclobenzaprine (FLEXERIL) 5 MG tablet Take 1 tablet (5 mg total) by mouth 3 (three) times daily as needed for muscle spasms. Patient not taking: Reported on 08/01/2016 04/13/16   Berton Bon, MD  fluticasone [facility]) 50 MCG/ACT nasal spray Place 2 sprays into both nostrils daily. 12/29/16   Everlene Farrier, PA-C  montelukast (SINGULAIR) 10 MG tablet Take 1 tablet (10 mg total) by mouth at bedtime. Patient not taking: Reported on 08/01/2016 02/21/16   Jessica Priest, MD  naproxen (NAPROSYN) 250 MG tablet Take 1 tablet (250 mg total) by mouth 2 (two) times daily with a meal. 12/29/16   Everlene Farrier, PA-C  norgestimate-ethinyl estradiol (ORTHO-CYCLEN,SPRINTEC,PREVIFEM) 0.25-35 MG-MCG tablet Take 1 tablet by mouth daily. 08/01/16   Aviva Signs, CNM  ondansetron (  ZOFRAN ODT) 4 MG disintegrating tablet Take 1 tablet (4 mg total) by mouth every 8 (eight) hours as needed for nausea or vomiting. 12/29/16   Everlene Farrier, PA-C  Prenatal Vit-Fe Fumarate-FA (PRENATAL MULTIVITAMIN) TABS tablet Take 1 tablet by mouth daily at 12 noon.    [provider]    Family History Family History  Problem Relation Age of Onset  . Cholelithiasis Mother   . Hyperlipidemia Mother   . Hypertension Mother   . Mental illness Mother   . Depression  Mother   . Cholelithiasis Maternal Uncle   . Hyperlipidemia Maternal Uncle   . Hypertension Maternal Uncle   . Cholelithiasis Maternal Grandmother   . Arthritis Maternal Grandmother   . COPD Maternal Grandmother   . Depression Maternal Grandmother   . Diabetes Maternal Grandmother   . Heart disease Maternal Grandmother   . Hyperlipidemia Maternal Grandmother   . Hypertension Maternal Grandmother   . Learning disabilities Paternal Grandmother   . Mental illness Paternal Grandmother   . Hyperlipidemia Paternal Grandfather   . Diabetes Paternal Grandfather   . Ulcers Neg Hx   . Alcohol abuse Neg Hx   . Asthma Neg Hx   . Birth defects Neg Hx   . Cancer Neg Hx   . Drug abuse Neg Hx   . Early death Neg Hx   . Hearing loss Neg Hx   . Kidney disease Neg Hx   . Mental retardation Neg Hx   . Miscarriages / Stillbirths Neg Hx   . Stroke Neg Hx   . Vision loss Neg Hx     Social History Social History  Substance Use Topics  . Smoking status: Never Smoker  . Smokeless tobacco: Never Used  . Alcohol use No    Allergies   Tramadol   Review of Systems Review of Systems  Constitutional: Negative for chills and fever.  HENT: Positive for postnasal drip, rhinorrhea, sinus pain, sinus pressure, sneezing, sore throat and voice change. Negative for congestion, ear pain, facial swelling and trouble swallowing.   Eyes: Positive for photophobia. Negative for pain and visual disturbance.  Respiratory: Negative for cough, shortness of breath and wheezing.   Cardiovascular: Negative for chest pain and palpitations.  Gastrointestinal: Positive for nausea and vomiting. Negative for abdominal pain, blood in stool and diarrhea.  Genitourinary: Negative for difficulty urinating, dysuria, frequency, hematuria, urgency, vaginal discharge and vaginal pain.  Musculoskeletal: Negative for back pain, neck pain and neck stiffness.  Skin: Negative for rash.  Neurological: Positive for headaches. Negative  for dizziness, seizures, syncope, speech difficulty, weakness, light-headedness and numbness.   Physical Exam Updated Vital Signs BP (!) 141/97   Pulse 83   Temp 98 F (36.7 C)   Resp 18   SpO2 100%   Physical Exam  Constitutional: She is oriented to person, place, and time. She appears well-developed and well-nourished. No distress.  Nontoxic appearing.  HENT:  Head: Normocephalic and atraumatic.  Right Ear: External ear normal.  Left Ear: External ear normal.  Mouth/Throat: Uvula is midline and oropharynx is clear and moist. No oropharyngeal exudate.  No TM erythema or loss of landmarks. Boggy nasal turbinates.Mild bilateral tonsillar erythema. No exudates. No PTA. No trismus. No drooling. Frontal sinus TTP. Uvula midline, no edema.   Eyes: Conjunctivae are normal. Pupils are equal, round, and reactive to light. Right eye exhibits no discharge. Left eye exhibits no discharge.  Neck: Normal range of motion. Neck supple. No JVD present. No tracheal deviation present.  Cardiovascular: Normal rate, regular rhythm, normal heart sounds and intact distal pulses.  Exam reveals no gallop and no friction rub.   No murmur heard. Pulmonary/Chest: Effort normal and breath sounds normal. No stridor. No respiratory distress. She has no wheezes. She has no rales.  Lungs are clear to ascultation bilaterally. Symmetric chest expansion bilaterally. No increased work of breathing. No rales or rhonchi.    Abdominal: Soft. Bowel sounds are normal. She exhibits no distension and no mass. There is no tenderness. There is no rebound and no guarding.  Abdomen is soft and nontender to palpation. Bowel sounds are present.  Musculoskeletal: Normal range of motion. She exhibits no edema or tenderness.  Patient is spontaneously moving all extremities in a coordinated fashion exhibiting good strength.   Lymphadenopathy:    She has no cervical adenopathy.  Neurological: She is alert and oriented to person, place,  and time. No cranial nerve deficit or sensory deficit. She exhibits normal muscle tone. Coordination normal.  Cranial nerves intact. Speech is clear and coherent. EOMI. No pronator drift. Finger-to-nose intact. Normal gait. Sensation intact for bilateral upper and lower extremities.   Skin: Skin is warm and dry. Capillary refill takes less than 2 seconds. No rash noted. She is not diaphoretic. No erythema. No pallor.  Psychiatric: She has a normal mood and affect. Her behavior is normal.  Nursing note and vitals reviewed.   ED Treatments / Results  DIAGNOSTIC STUDIES:  Oxygen Saturation is 99% on RA, normal by my interpretation.    COORDINATION OF CARE:  7:53 PM Discussed treatment plan with pt at bedside and pt agreed to plan.  Labs (all labs ordered are listed, but only abnormal results are displayed) Labs Reviewed  CBC WITH DIFFERENTIAL/PLATELET - Abnormal; Notable for the following:       Result Value   RBC 5.23 (*)    Hemoglobin 11.8 (*)    MCV 76.1 (*)    MCH 22.6 (*)    MCHC 29.6 (*)    RDW 16.2 (*)    All other components within normal limits  LIPASE, BLOOD - Abnormal; Notable for the following:    Lipase 54 (*)    All other components within normal limits  COMPREHENSIVE METABOLIC PANEL - Abnormal; Notable for the following:    Glucose, Bld 100 (*)    Total Bilirubin 0.2 (*)    All other components within normal limits  URINALYSIS, ROUTINE W REFLEX MICROSCOPIC  I-STAT BETA HCG BLOOD, ED ([facility], [facility], [facility] ONLY)    EKG  EKG Interpretation None      Radiology No results found.  Procedures Procedures (including critical care time)  Medications Ordered in ED Medications  metoCLOPramide (REGLAN) injection 10 mg (10 mg Intravenous Given 12/29/16 2015)  diphenhydrAMINE (BENADRYL) injection 25 mg (25 mg Intravenous Given 12/29/16 2015)     Initial Impression / Assessment and Plan / ED Course  I have reviewed the triage vital signs and the nursing notes.  Pertinent  labs & imaging results that were available during my care of the patient were reviewed by me and considered in my medical decision making (see chart for details).    This is a 21 y.o. female with a history of migraines who presents to the Emergency Department complaining of generalized headache which began this morning when she woke up. Pt currently rates this as 8/10 pain and states it feels similar to previous migraines. She reports associated hoarse voice, nausea, vomiting, rhinorrhea, postnasal drip, congestion and photophobia.  She tried Dayquil and Mucinex with no reported relief; no other medications taken PTA.  Per pt, her symptoms began yesterday with a sore throat and hoarse voice. She reports vomiting once today PTA. She reports having post nasal drip and believes this made her nauseated and caused her to vomit. She denies abdominal pain. She denies alcohol use. On exam the patient is afebrile and non-toxic appearing. She has no focal neurological deficits. Abdomen is soft and nontender palpation. She does have rhinorrhea and boggy nasal turbinates bilaterally. Suspect this could be contributing to her headache. We'll check blood work, and provide with migraine cocktail. Pregnancy test is negative. Urinalysis without signs of infection. CMP is unremarkable. CBC is unremarkable. No leukocytosis. Lipase is mildly elevated at 54. Patient appears to have chronic elevations of her lipase. She had an elevation in 2016 and had a CT scan that showed no abnormalities of her low pancreas. She has no abdominal pain and has had no further vomiting today. See no need for repeat CT scan at this time. She is tolerating by mouth diet reevaluation and reports her headache has resolved. She feels ready for discharge. I'm not concerned for temporal arteritis, meningitis or acute intracanal pathology. We'll discharge at this time of her transfer Flonase, Zyrtec and naproxen. I discussed return precautions and encouraged  her to follow-up with primary care. I advised the patient to follow-up with their primary care provider this week. I advised the patient to return to the emergency department with new or worsening symptoms or new concerns. The patient verbalized understanding and agreement with plan.     Final Clinical Impressions(s) / ED Diagnoses   Final diagnoses:  Bad headache  Acute nasopharyngitis  Seasonal allergic rhinitis due to other allergic trigger  Elevated lipase  Non-intractable vomiting with nausea, unspecified vomiting type    New Prescriptions Discharge Medication List as of 12/29/2016  9:33 PM    START taking these medications   Details  cetirizine (ZYRTEC ALLERGY) 10 MG tablet Take 1 tablet (10 mg total) by mouth daily., Starting Sat 12/29/2016, Print    fluticasone (FLONASE) 50 MCG/ACT nasal spray Place 2 sprays into both nostrils daily., Starting Sat 12/29/2016, Print    naproxen (NAPROSYN) 250 MG tablet Take 1 tablet (250 mg total) by mouth 2 (two) times daily with a meal., Starting Sat 12/29/2016, Print    ondansetron (ZOFRAN ODT) 4 MG disintegrating tablet Take 1 tablet (4 mg total) by mouth every 8 (eight) hours as needed for nausea or vomiting., Starting Sat 12/29/2016, Print       I personally performed the services described in this documentation, which was scribed in my presence. The recorded information has been reviewed and is accurate.       Everlene FarrierDansie, Lavone Barrientes, PA-C 12/29/16 2200    Shaune PollackIsaacs, [place], MD 12/29/16 [phone]

## 2017-07-22 ENCOUNTER — Ambulatory Visit (INDEPENDENT_AMBULATORY_CARE_PROVIDER_SITE_OTHER): Payer: 59

## 2017-07-22 ENCOUNTER — Encounter (HOSPITAL_COMMUNITY): Payer: Self-pay | Admitting: Emergency Medicine

## 2017-07-22 ENCOUNTER — Ambulatory Visit (HOSPITAL_COMMUNITY)
Admission: EM | Admit: 2017-07-22 | Discharge: 2017-07-22 | Disposition: A | Discharge status: Home / Self Care | Payer: 59 | Attending: Physician Assistant | Admitting: Physician Assistant

## 2017-07-22 DIAGNOSIS — J069 Acute upper respiratory infection, unspecified: Secondary | ICD-10-CM | POA: Diagnosis not present

## 2017-07-22 MED ORDER — IBUPROFEN 800 MG PO TABS: 800.0000 mg | ORAL_TABLET | Freq: Four times a day (QID) | ORAL | 0 refills | Status: DC | PRN

## 2017-07-22 MED ORDER — BENZONATATE 100 MG PO CAPS: 100.0000 mg | ORAL_CAPSULE | Freq: Three times a day (TID) | ORAL | 0 refills | Status: DC

## 2017-07-22 MED ORDER — AZITHROMYCIN 250 MG PO TABS: 250.0000 mg | ORAL_TABLET | Freq: Every day | ORAL | 0 refills | Status: DC

## 2017-07-22 MED ORDER — PREDNISONE 10 MG PO TABS: 40.0000 mg | ORAL_TABLET | Freq: Every day | ORAL | 0 refills | Status: AC

## 2017-07-22 MED ORDER — ONDANSETRON HCL 4 MG PO TABS: 4.0000 mg | ORAL_TABLET | Freq: Three times a day (TID) | ORAL | 0 refills | Status: DC | PRN

## 2017-07-22 NOTE — ED Triage Notes (Signed)
Pt here c/o cough and URI sx x 3 days

## 2017-07-22 NOTE — ED Provider Notes (Signed)
[facility]    CSN: [id number] Arrival date & time: 07/22/17  1239     History   Chief Complaint Chief Complaint  Patient presents with  . URI    HPI Jeanine Caven is a 21 y.o. female.   21 year-old female, presenting today with flu like symptoms. Complaining of myalgias, cough, fatigue, nasal congestion, nausea and one episode of vomiting. Symptoms started last night. Son diagnosed with pneumonia last night    The history is provided by the patient.  URI  Presenting symptoms: congestion, cough, fatigue, fever and rhinorrhea   Presenting symptoms: no ear pain and no sore throat   Severity:  Moderate Onset quality:  Gradual Duration:  1 day Timing:  Constant Progression:  Unchanged Chronicity:  New Relieved by:  Nothing Worsened by:  Nothing Ineffective treatments:  None tried Associated symptoms: myalgias   Associated symptoms: no arthralgias, no headaches, no neck pain, no sinus pain, no sneezing, no swollen glands and no wheezing   Risk factors: sick contacts   Risk factors: not elderly, no chronic cardiac disease, no chronic kidney disease, no chronic respiratory disease and no diabetes mellitus     Past Medical History:  Diagnosis Date  . Anxiety   . Depression   . Gastritis    2 bouts  . Migraine   . Psoriasis   . Vision abnormalities    wears glasses    Patient Active Problem List   Diagnosis Date Noted  . Gestation size to date discrepancy 04/16/2016  . BMI 45.0-49.9, adult (HCC) 03/15/2016    Past Surgical History:  Procedure Laterality Date  . ESOPHAGOGASTRODUODENOSCOPY N/A 10/17/2012   Procedure: ESOPHAGOGASTRODUODENOSCOPY (EGD);  Surgeon: Jon Gills, MD;  Location: [facility] OR;  Service: Gastroenterology;  Laterality: N/A;    OB History    Gravida Para Term Preterm AB Living   1 1 1     1    SAB TAB Ectopic Multiple Live Births         0 1       Home Medications    Prior to Admission medications   Medication Sig Start  Date End Date Taking? Authorizing Provider  azithromycin (ZITHROMAX) 250 MG tablet Take 1 tablet (250 mg total) by mouth daily. Take first 2 tablets together, then 1 every day until finished. 07/25/17   Chin Wachter C, PA-C  benzonatate (TESSALON) 100 MG capsule Take 1 capsule (100 mg total) by mouth every 8 (eight) hours. 07/22/17   Aneliese Beaudry C, PA-C  cetirizine (ZYRTEC ALLERGY) 10 MG tablet Take 1 tablet (10 mg total) by mouth daily. 12/29/16   Everlene Farrier, PA-C  cyclobenzaprine (FLEXERIL) 5 MG tablet Take 1 tablet (5 mg total) by mouth 3 (three) times daily as needed for muscle spasms. Patient not taking: Reported on 08/01/2016 04/13/16   Berton Bon, MD  fluticasone [facility]) 50 MCG/ACT nasal spray Place 2 sprays into both nostrils daily. 12/29/16   Everlene Farrier, PA-C  ibuprofen (ADVIL,MOTRIN) 800 MG tablet Take 1 tablet (800 mg total) by mouth every 6 (six) hours as needed. 07/22/17   Leiloni Smithers C, PA-C  montelukast (SINGULAIR) 10 MG tablet Take 1 tablet (10 mg total) by mouth at bedtime. Patient not taking: Reported on 08/01/2016 02/21/16   Jessica Priest, MD  naproxen (NAPROSYN) 250 MG tablet Take 1 tablet (250 mg total) by mouth 2 (two) times daily with a meal. 12/29/16   Everlene Farrier, PA-C  norgestimate-ethinyl estradiol (ORTHO-CYCLEN,SPRINTEC,PREVIFEM) 0.25-35 MG-MCG tablet Take  1 tablet by mouth daily. 08/01/16   Aviva Signs, CNM  ondansetron (ZOFRAN ODT) 4 MG disintegrating tablet Take 1 tablet (4 mg total) by mouth every 8 (eight) hours as needed for nausea or vomiting. 12/29/16   Everlene Farrier, PA-C  ondansetron (ZOFRAN) 4 MG tablet Take 1 tablet (4 mg total) by mouth every 8 (eight) hours as needed for nausea or vomiting. 07/22/17   Kiandria Clum C, PA-C  predniSONE (DELTASONE) 10 MG tablet Take 4 tablets (40 mg total) by mouth daily for 5 days. 07/22/17 07/27/17  Mariha Sleeper, Marylene Land, PA-C  Prenatal Vit-Fe Fumarate-FA (PRENATAL MULTIVITAMIN) TABS tablet Take 1 tablet by  mouth daily at 12 noon.    [provider]    Family History Family History  Problem Relation Age of Onset  . Cholelithiasis Mother   . Hyperlipidemia Mother   . Hypertension Mother   . Mental illness Mother   . Depression Mother   . Cholelithiasis Maternal Uncle   . Hyperlipidemia Maternal Uncle   . Hypertension Maternal Uncle   . Cholelithiasis Maternal Grandmother   . Arthritis Maternal Grandmother   . COPD Maternal Grandmother   . Depression Maternal Grandmother   . Diabetes Maternal Grandmother   . Heart disease Maternal Grandmother   . Hyperlipidemia Maternal Grandmother   . Hypertension Maternal Grandmother   . Learning disabilities Paternal Grandmother   . Mental illness Paternal Grandmother   . Hyperlipidemia Paternal Grandfather   . Diabetes Paternal Grandfather   . Ulcers Neg Hx   . Alcohol abuse Neg Hx   . Asthma Neg Hx   . Birth defects Neg Hx   . Cancer Neg Hx   . Drug abuse Neg Hx   . Early death Neg Hx   . Hearing loss Neg Hx   . Kidney disease Neg Hx   . Mental retardation Neg Hx   . Miscarriages / Stillbirths Neg Hx   . Stroke Neg Hx   . Vision loss Neg Hx     Social History Social History   Tobacco Use  . Smoking status: Never Smoker  . Smokeless tobacco: Never Used  Substance Use Topics  . Alcohol use: No  . Drug use: No     Allergies   Tramadol   Review of Systems Review of Systems  Constitutional: Positive for fatigue and fever. Negative for chills.  HENT: Positive for congestion and rhinorrhea. Negative for ear pain, sinus pain, sneezing and sore throat.   Eyes: Negative for pain and visual disturbance.  Respiratory: Positive for cough. Negative for shortness of breath and wheezing.   Cardiovascular: Negative for chest pain and palpitations.  Gastrointestinal: Positive for nausea and vomiting. Negative for abdominal pain.  Genitourinary: Negative for dysuria and hematuria.  Musculoskeletal: Positive for myalgias.  Negative for arthralgias, back pain and neck pain.  Skin: Negative for color change and rash.  Neurological: Negative for seizures, syncope and headaches.  All other systems reviewed and are negative.    Physical Exam Triage Vital Signs ED Triage Vitals [07/22/17 1256]  Enc Vitals Group     BP (!) 148/78     Pulse Rate 81     Resp 18     Temp 98.1 F (36.7 C)     Temp Source Oral     SpO2 100 %     Weight      Height      Head Circumference      Peak Flow  Pain Score      Pain Loc      Pain Edu?      Excl. in GC?    No data found.  Updated Vital Signs BP (!) 148/78 (BP Location: Right Arm)   Pulse 81   Temp 98.1 F (36.7 C) (Oral)   Resp 18   LMP 06/27/2017 (Exact Date)   SpO2 100%   Visual Acuity Right Eye Distance:   Left Eye Distance:   Bilateral Distance:    Right Eye Near:   Left Eye Near:    Bilateral Near:     Physical Exam  Constitutional: She is oriented to person, place, and time. She appears well-developed and well-nourished. No distress.  HENT:  Head: Normocephalic.  Right Ear: Hearing, tympanic membrane, external ear and ear canal normal.  Left Ear: Hearing, tympanic membrane, external ear and ear canal normal.  Nose: Nose normal.  Mouth/Throat: Oropharynx is clear and moist.  Eyes: EOM are normal. Pupils are equal, round, and reactive to light.  Neck: Normal range of motion.  Cardiovascular: Normal rate.  Pulmonary/Chest: Effort normal and breath sounds normal. No stridor. She has no decreased breath sounds. She has no wheezes. She has no rhonchi. She has no rales.  Abdominal: Soft.  Musculoskeletal: Normal range of motion.  Neurological: She is alert and oriented to person, place, and time.  Skin: Skin is warm. She is not diaphoretic.  Psychiatric: She has a normal mood and affect.  Nursing note and vitals reviewed.    UC Treatments / Results  Labs (all labs ordered are listed, but only abnormal results are displayed) Labs  Reviewed - No data to display  EKG  EKG Interpretation None       Radiology Dg Chest 2 View  Result Date: 07/22/2017 CLINICAL DATA:  [age] year old female with nonproductive cough fever dizziness body ache nausea vomiting and congestion for 3 days. EXAM: CHEST  2 VIEW COMPARISON:  Chest radiographs 05/25/2013. FINDINGS: Large body habitus. Lung volumes remain normal. Mediastinal contours remain within normal limits. Visualized tracheal air column is within normal limits. The lungs are clear. No pneumothorax or pleural effusion. No osseous abnormality identified. Paucity of bowel gas in the visible abdomen. IMPRESSION: Negative.  No acute cardiopulmonary abnormality. Electronically Signed   By: Odessa FlemingH  Hall M.D.   On: 07/22/2017 13:29    Procedures Procedures (including critical care time)  Medications Ordered in UC Medications - No data to display   Initial Impression / Assessment and Plan / UC Course  I have reviewed the triage vital signs and the nursing notes.  Pertinent labs & imaging results that were available during my care of the patient were reviewed by me and considered in my medical decision making (see chart for details).     Normal CXR Likely viral syndrome  Will treat symptomatically with prednisone, tessalon and zpak sent to pharmacy not to be filled until Thursday if symptoms persist   Final Clinical Impressions(s) / UC Diagnoses   Final diagnoses:  Viral upper respiratory tract infection    ED Discharge Orders        Ordered    azithromycin (ZITHROMAX) 250 MG tablet  Daily     07/22/17 1341    predniSONE (DELTASONE) 10 MG tablet  Daily     07/22/17 1341    benzonatate (TESSALON) 100 MG capsule  Every 8 hours     07/22/17 1341    ibuprofen (ADVIL,MOTRIN) 800 MG tablet  Every 6 hours PRN  07/22/17 1346    ondansetron (ZOFRAN) 4 MG tablet  Every 8 hours PRN     07/22/17 1346       Controlled Substance Prescriptions [place] Controlled Substance Registry  consulted? Not Applicable   Alecia LemmingBlue, Tali Coster C, [place] JerseyPA-C 07/22/17 1351

## 2017-07-24 ENCOUNTER — Emergency Department (HOSPITAL_COMMUNITY)
Admission: EM | Admit: 2017-07-24 | Discharge: 2017-07-24 | Disposition: A | Discharge status: Home / Self Care | Payer: 59 | Attending: Emergency Medicine | Admitting: Emergency Medicine

## 2017-07-24 ENCOUNTER — Encounter (HOSPITAL_COMMUNITY): Payer: Self-pay | Admitting: Emergency Medicine

## 2017-07-24 ENCOUNTER — Other Ambulatory Visit: Payer: Self-pay

## 2017-07-24 DIAGNOSIS — H1031 Unspecified acute conjunctivitis, right eye: Secondary | ICD-10-CM | POA: Diagnosis not present

## 2017-07-24 DIAGNOSIS — R05 Cough: Secondary | ICD-10-CM | POA: Diagnosis not present

## 2017-07-24 DIAGNOSIS — H10021 Other mucopurulent conjunctivitis, right eye: Secondary | ICD-10-CM | POA: Diagnosis present

## 2017-07-24 DIAGNOSIS — J00 Acute nasopharyngitis [common cold]: Secondary | ICD-10-CM | POA: Diagnosis not present

## 2017-07-24 DIAGNOSIS — Z79899 Other long term (current) drug therapy: Secondary | ICD-10-CM | POA: Insufficient documentation

## 2017-07-24 DIAGNOSIS — J3489 Other specified disorders of nose and nasal sinuses: Secondary | ICD-10-CM | POA: Insufficient documentation

## 2017-07-24 MED ORDER — FLUORESCEIN SODIUM 1 MG OP STRP
1.0000 | ORAL_STRIP | Freq: Once | OPHTHALMIC | Status: AC
  Administered 2017-07-24: 1 via OPHTHALMIC
  Filled 2017-07-24: qty 1

## 2017-07-24 MED ORDER — PROPARACAINE HCL 0.5 % OP SOLN
1.0000 [drp] | Freq: Once | OPHTHALMIC | Status: AC
  Administered 2017-07-24: 1 [drp] via OPHTHALMIC
  Filled 2017-07-24: qty 15

## 2017-07-24 MED ORDER — FLUTICASONE PROPIONATE 50 MCG/ACT NA SUSP: 2.0000 | Freq: Every day | NASAL | 0 refills | Status: DC

## 2017-07-24 MED ORDER — ERYTHROMYCIN 5 MG/GM OP OINT: 1.0000 "application " | TOPICAL_OINTMENT | Freq: Four times a day (QID) | OPHTHALMIC | 1 refills | Status: DC

## 2017-07-24 NOTE — ED Provider Notes (Signed)
[facility] Provider Note   CSN: [id number] Arrival date & time: 07/24/17  [id number]     History   Chief Complaint Chief Complaint  Patient presents with  . Eye Drainage    HPI Traci Castro is a 21 y.o. female.  Traci Castro is a 21 y.o. Female who presents to the emergency department complaining of right eye redness and drainage ongoing for the past 2 days.  Patient also reports associated symptoms of an upper respiratory infection.  She complains of sneezing, nasal congestion, postnasal drip and some coughing.  No fevers.  She was seen by urgent care yesterday and was diagnosed with a URI.  She reports she is had persistent right eye redness and watery discharge.  There is also some itching associated.  She wears glasses but not contacts.  No close contacts with conjunctivitis.  She has had no fevers.  She took ibuprofen today with little relief of her symptoms.  She denies fevers, blurry vision, double vision, neck pain, sore throat, trouble swallowing, wheezing, shortness of breath, abdominal pain or rashes.   The history is provided by the patient and medical records. No language interpreter was used.    Past Medical History:  Diagnosis Date  . Anxiety   . Depression   . Gastritis    2 bouts  . Migraine   . Psoriasis   . Vision abnormalities    wears glasses    Patient Active Problem List   Diagnosis Date Noted  . Gestation size to date discrepancy 04/16/2016  . BMI 45.0-49.9, adult (HCC) 03/15/2016    Past Surgical History:  Procedure Laterality Date  . ESOPHAGOGASTRODUODENOSCOPY N/A 10/17/2012   Procedure: ESOPHAGOGASTRODUODENOSCOPY (EGD);  Surgeon: Jon GillsJoseph H Clark, MD;  [facility];  Service: Gastroenterology;  Laterality: N/A;    OB History    Gravida Para Term Preterm AB Living   1 1 1     1    SAB TAB Ectopic Multiple Live Births         0 1       Home Medications    Prior to Admission medications     Medication Sig Start Date End Date Taking? Authorizing Provider  azithromycin (ZITHROMAX) 250 MG tablet Take 1 tablet (250 mg total) by mouth daily. Take first 2 tablets together, then 1 every day until finished. 07/25/17   Blue, Olivia C, PA-C  benzonatate (TESSALON) 100 MG capsule Take 1 capsule (100 mg total) by mouth every 8 (eight) hours. 07/22/17   Blue, Olivia C, PA-C  cetirizine (ZYRTEC ALLERGY) 10 MG tablet Take 1 tablet (10 mg total) by mouth daily. 12/29/16   Everlene Farrieransie, Calissa Swenor, PA-C  cyclobenzaprine (FLEXERIL) 5 MG tablet Take 1 tablet (5 mg total) by mouth 3 (three) times daily as needed for muscle spasms. Patient not taking: Reported on 08/01/2016 04/13/16   Berton BonMikell, Asiyah Zahra, MD  erythromycin ophthalmic ointment Place 1 application into the right eye every 6 (six) hours. Place 1/2 inch ribbon of ointment in the affected eye 4 times a day 07/24/17   Everlene Farrieransie, Vala Raffo, PA-C  fluticasone [facility](FLONASE) 50 MCG/ACT nasal spray Place 2 sprays into both nostrils daily. 07/24/17   Everlene Farrieransie, Olman Yono, PA-C  ibuprofen (ADVIL,MOTRIN) 800 MG tablet Take 1 tablet (800 mg total) by mouth every 6 (six) hours as needed. 07/22/17   Blue, Olivia C, PA-C  montelukast (SINGULAIR) 10 MG tablet Take 1 tablet (10 mg total) by mouth at bedtime. Patient not taking: Reported on 08/01/2016  02/21/16   Kozlow, Alvira Philips, MD  naproxen (NAPROSYN) 250 MG tablet Take 1 tablet (250 mg total) by mouth 2 (two) times daily with a meal. 12/29/16   Everlene Farrier, PA-C  norgestimate-ethinyl estradiol (ORTHO-CYCLEN,SPRINTEC,PREVIFEM) 0.25-35 MG-MCG tablet Take 1 tablet by mouth daily. 08/01/16   Aviva Signs, CNM  ondansetron (ZOFRAN ODT) 4 MG disintegrating tablet Take 1 tablet (4 mg total) by mouth every 8 (eight) hours as needed for nausea or vomiting. 12/29/16   Everlene Farrier, PA-C  ondansetron (ZOFRAN) 4 MG tablet Take 1 tablet (4 mg total) by mouth every 8 (eight) hours as needed for nausea or vomiting. 07/22/17   Blue, Olivia C, PA-C   predniSONE (DELTASONE) 10 MG tablet Take 4 tablets (40 mg total) by mouth daily for 5 days. 07/22/17 07/27/17  Blue, Marylene Land, PA-C  Prenatal Vit-Fe Fumarate-FA (PRENATAL MULTIVITAMIN) TABS tablet Take 1 tablet by mouth daily at 12 noon.    [provider]    Family History Family History  Problem Relation Age of Onset  . Cholelithiasis Mother   . Hyperlipidemia Mother   . Hypertension Mother   . Mental illness Mother   . Depression Mother   . Cholelithiasis Maternal Uncle   . Hyperlipidemia Maternal Uncle   . Hypertension Maternal Uncle   . Cholelithiasis Maternal Grandmother   . Arthritis Maternal Grandmother   . COPD Maternal Grandmother   . Depression Maternal Grandmother   . Diabetes Maternal Grandmother   . Heart disease Maternal Grandmother   . Hyperlipidemia Maternal Grandmother   . Hypertension Maternal Grandmother   . Learning disabilities Paternal Grandmother   . Mental illness Paternal Grandmother   . Hyperlipidemia Paternal Grandfather   . Diabetes Paternal Grandfather   . Ulcers Neg Hx   . Alcohol abuse Neg Hx   . Asthma Neg Hx   . Birth defects Neg Hx   . Cancer Neg Hx   . Drug abuse Neg Hx   . Early death Neg Hx   . Hearing loss Neg Hx   . Kidney disease Neg Hx   . Mental retardation Neg Hx   . Miscarriages / Stillbirths Neg Hx   . Stroke Neg Hx   . Vision loss Neg Hx     Social History Social History   Tobacco Use  . Smoking status: Never Smoker  . Smokeless tobacco: Never Used  Substance Use Topics  . Alcohol use: No  . Drug use: No     Allergies   Tramadol   Review of Systems Review of Systems  Constitutional: Negative for chills and fever.  HENT: Positive for congestion, postnasal drip, rhinorrhea and sneezing. Negative for sore throat and trouble swallowing.   Eyes: Positive for discharge, redness and itching. Negative for photophobia, pain and visual disturbance.  Respiratory: Positive for cough. Negative for shortness of  breath and wheezing.   Cardiovascular: Negative for chest pain and palpitations.  Gastrointestinal: Negative for abdominal pain and nausea.  Genitourinary: Negative for dysuria.  Musculoskeletal: Negative for back pain, neck pain and neck stiffness.  Skin: Negative for rash.  Neurological: Negative for light-headedness and headaches.     Physical Exam Updated Vital Signs BP 137/79 (BP Location: Right Arm)   Pulse 80   Temp 98.4 F (36.9 C) (Oral)   Resp 14   LMP 06/27/2017 (Exact Date)   SpO2 99%   Physical Exam  Constitutional: She appears well-developed and well-nourished. No distress.  Nontoxic-appearing.  HENT:  Head: Normocephalic and atraumatic.  Right Ear: External ear normal.  Left Ear: External ear normal.  Mouth/Throat: No oropharyngeal exudate.  Rhinorrhea present bilaterally. Mild bilateral tonsillar hypertrophy without exudates.  No drooling.  Eyes: Pupils are equal, round, and reactive to light. Right eye exhibits no discharge. Left eye exhibits no discharge.  Right eye with mild conjunctival injection without evidence of any discharge.  Left eye has no conjunctival injection.  No discharge.  EOMs are intact.  Vision is grossly intact. Right eye was anesthetized with proparacaine and stained with fluorescein. No Fluorescein uptake on exam.   Neck: Neck supple.  Cardiovascular: Normal rate, regular rhythm, normal heart sounds and intact distal pulses.  Pulmonary/Chest: Effort normal and breath sounds normal. No stridor. No respiratory distress. She has no wheezes. She has no rales.  Lungs are clear to ascultation bilaterally. Symmetric chest expansion bilaterally. No increased work of breathing. No rales or rhonchi.    Abdominal: Soft. There is no tenderness.  Lymphadenopathy:    She has no cervical adenopathy.  Neurological: She is alert. Coordination normal.  Skin: Skin is warm and dry. No rash noted. She is not diaphoretic.  Psychiatric: She has a normal mood  and affect. Her behavior is normal.  Nursing note and vitals reviewed.    ED Treatments / Results  Labs (all labs ordered are listed, but only abnormal results are displayed) Labs Reviewed - No data to display  EKG  EKG Interpretation None       Radiology Dg Chest 2 View  Result Date: 07/22/2017 CLINICAL DATA:  21 year old female with nonproductive cough fever dizziness body ache nausea vomiting and congestion for 3 days. EXAM: CHEST  2 VIEW COMPARISON:  Chest radiographs 05/25/2013. FINDINGS: Large body habitus. Lung volumes remain normal. Mediastinal contours remain within normal limits. Visualized tracheal air column is within normal limits. The lungs are clear. No pneumothorax or pleural effusion. No osseous abnormality identified. Paucity of bowel gas in the visible abdomen. IMPRESSION: Negative.  No acute cardiopulmonary abnormality. Electronically Signed   By: Odessa Fleming M.D.   On: 07/22/2017 13:29    Procedures Procedures (including critical care time)    Visual Acuity  Right Eye Distance: 20/20 Left Eye Distance: 20/20 Bilateral Distance: 20/20  Right Eye Near: R Near: 20/20 Left Eye Near:  L Near: 20 20  Bilateral Near:  20/20   Medications Ordered in ED Medications  fluorescein ophthalmic strip 1 strip (not administered)  proparacaine (ALCAINE) 0.5 % ophthalmic solution 1 drop (not administered)     Initial Impression / Assessment and Plan / ED Course  I have reviewed the triage vital signs and the nursing notes.  Pertinent labs & imaging results that were available during my care of the patient were reviewed by me and considered in my medical decision making (see chart for details).     This is a 21 y.o. Female who presents to the emergency department complaining of right eye redness and drainage ongoing for the past 2 days.  Patient also reports associated symptoms of an upper respiratory infection.  She complains of sneezing, nasal congestion, postnasal  drip and some coughing.  No fevers.  She was seen by urgent care yesterday and was diagnosed with a URI.  She reports she is had persistent right eye redness and watery discharge.  There is also some itching associated.  She wears glasses but not contacts.  No close contacts with conjunctivitis.  She has had no fevers.  On exam the patient is afebrile nontoxic appearing.  Her lungs are clear to auscultation bilaterally.  She has right eye conjunctival injection with mild discharge.  No fluorescein uptake on exam.  Patient likely with viral conjunctivitis.  Visual acuity is 20/20 bilaterally.  Will provide the patient with a prescription for erythromycin ointment.  I encouraged her to initially use over-the-counter saline drops.  I advised if her right eye does not improve she can start using erythromycin for bacterial conjunctivitis.  I suspect with her URI symptoms this is likely viral.  Return precautions discussed. I advised the patient to follow-up with their primary care provider this week. I advised the patient to return to the emergency department with new or worsening symptoms or new concerns. The patient verbalized understanding and agreement with plan.    Final Clinical Impressions(s) / ED Diagnoses   Final diagnoses:  Acute conjunctivitis of right eye, unspecified acute conjunctivitis type  Acute nasopharyngitis    ED Discharge Orders        Ordered    erythromycin ophthalmic ointment  Every 6 hours     07/24/17 1012    fluticasone (FLONASE) 50 MCG/ACT nasal spray  Daily     07/24/17 1012       Everlene FarrierDansie, Lyfe Reihl, PA-C 07/24/17 1017    Cathren LaineSteinl, Kevin, MD 07/24/17 1324

## 2017-07-24 NOTE — ED Triage Notes (Signed)
Pt here from home with c/o right eye redness and drainage , [facility] and dx with URI

## 2017-07-24 NOTE — ED Triage Notes (Signed)
Pt reports no blurred vision or vision changes

## 2017-08-27 NOTE — L&D Delivery Note (Addendum)
Delivery Note Patient is a 22 y.o. now [id number] s/p NSVD at [id number], who was admitted for IOL for BPP 4/8. Baby has been reactive throughout labor. She progressed with Cytotec and Pitocin augmentation to complete, with a bulging bag of water over the perineum. After AROM, she pushed twice to deliver. At 2:47 PM a viable female was delivered via Vaginal, Spontaneous (Presentation: OA).  APGAR: 9, 9; weight 6 lb 8.1 oz (2950 g).   Cord clamping delayed by several minutes then clamped by SNM and cut by FOB.  Placenta intact and spontaneous, bleeding minimal. Cord intact with 3 vessels and no complications. No laceration repair needed.  Mom and baby stable prior to transfer to postpartum. She plans on breastfeeding. She remains unsure about future birth control.  Anesthesia: None Episiotomy: None Lacerations: None Suture Repair: None Est. Blood Loss (mL): 500  Mom to postpartum.  Baby to Couplet care / Skin to Skin. Bernerd LimboJamilla R Walker, SNM 07/23/2018, 7:31 PM  I confirm that I have verified the information documented in the SNM's note and that I have also personally reperformed the physical exam and all medical decision making activities.  I was gloved and present for entire delivery SVD without incident No difficulty with shoulders No lacerations  Aviva SignsMarie L Lodema Parma, CNM

## 2017-08-28 ENCOUNTER — Emergency Department (HOSPITAL_COMMUNITY): Payer: 59

## 2017-08-28 ENCOUNTER — Encounter (HOSPITAL_COMMUNITY): Payer: Self-pay | Admitting: Emergency Medicine

## 2017-08-28 ENCOUNTER — Other Ambulatory Visit: Payer: Self-pay

## 2017-08-28 ENCOUNTER — Emergency Department (HOSPITAL_COMMUNITY)
Admission: EM | Admit: 2017-08-28 | Discharge: 2017-08-29 | Disposition: A | Discharge status: Home / Self Care | Payer: 59 | Attending: Emergency Medicine | Admitting: Emergency Medicine

## 2017-08-28 DIAGNOSIS — O9989 Other specified diseases and conditions complicating pregnancy, childbirth and the puerperium: Secondary | ICD-10-CM | POA: Insufficient documentation

## 2017-08-28 DIAGNOSIS — Z3481 Encounter for supervision of other normal pregnancy, first trimester: Secondary | ICD-10-CM | POA: Diagnosis not present

## 2017-08-28 DIAGNOSIS — Z3A01 Less than 8 weeks gestation of pregnancy: Secondary | ICD-10-CM

## 2017-08-28 DIAGNOSIS — Z79899 Other long term (current) drug therapy: Secondary | ICD-10-CM | POA: Diagnosis not present

## 2017-08-28 DIAGNOSIS — R1030 Lower abdominal pain, unspecified: Secondary | ICD-10-CM | POA: Diagnosis present

## 2017-08-28 LAB — URINALYSIS, ROUTINE W REFLEX MICROSCOPIC
Bilirubin Urine: NEGATIVE
Glucose, UA: NEGATIVE mg/dL
Hgb urine dipstick: NEGATIVE
KETONES UR: NEGATIVE mg/dL
LEUKOCYTES UA: NEGATIVE
NITRITE: NEGATIVE
PROTEIN: NEGATIVE mg/dL
Specific Gravity, Urine: 1.024 (ref 1.005–1.030)
pH: 6 (ref 5.0–8.0)

## 2017-08-28 LAB — COMPREHENSIVE METABOLIC PANEL
ALT: 16 U/L (ref 14–54)
AST: 15 U/L (ref 15–41)
Albumin: 3.3 g/dL — ABNORMAL LOW (ref 3.5–5.0)
Alkaline Phosphatase: 69 U/L (ref 38–126)
Anion gap: 7 (ref 5–15)
BILIRUBIN TOTAL: 0.2 mg/dL — AB (ref 0.3–1.2)
BUN: 10 mg/dL (ref 6–20)
CO2: 21 mmol/L — ABNORMAL LOW (ref 22–32)
Calcium: 8.6 mg/dL — ABNORMAL LOW (ref 8.9–10.3)
Chloride: 105 mmol/L (ref 101–111)
Creatinine, Ser: 0.74 mg/dL (ref 0.44–1.00)
GFR calc Af Amer: >60 mL/min (ref >60)
Glucose, Bld: 90 mg/dL (ref 65–99)
POTASSIUM: 3.9 mmol/L (ref 3.5–5.1)
Sodium: 133 mmol/L — ABNORMAL LOW (ref 135–145)
TOTAL PROTEIN: 6.3 g/dL — AB (ref 6.5–8.1)

## 2017-08-28 LAB — CBC
HCT: 36 % (ref 36.0–46.0)
Hemoglobin: 10.9 g/dL — ABNORMAL LOW (ref 12.0–15.0)
MCH: 22.3 pg — ABNORMAL LOW (ref 26.0–34.0)
MCHC: 30.3 g/dL (ref 30.0–36.0)
MCV: 73.6 fL — ABNORMAL LOW (ref 78.0–100.0)
PLATELETS: 349 10*3/uL (ref 150–400)
RBC: 4.89 MIL/uL (ref 3.87–5.11)
RDW: 16.8 % — AB (ref 11.5–15.5)
WBC: 9.4 10*3/uL (ref 4.0–10.5)

## 2017-08-28 LAB — I-STAT BETA HCG BLOOD, ED (MC, WL, AP ONLY)

## 2017-08-28 LAB — HCG, QUANTITATIVE, PREGNANCY: HCG, BETA CHAIN, QUANT, S: 102068 m[IU]/mL — AB (ref <5)

## 2017-08-28 LAB — LIPASE, BLOOD: Lipase: 60 U/L — ABNORMAL HIGH (ref 11–51)

## 2017-08-28 MED ORDER — ACETAMINOPHEN 325 MG PO TABS
325.0000 mg | ORAL_TABLET | Freq: Once | ORAL | Status: AC
  Administered 2017-08-28: 325 mg via ORAL
  Filled 2017-08-28 (×2): qty 1

## 2017-08-28 MED ORDER — PYRIDOXINE HCL 25 MG PO TABS
25.0000 mg | ORAL_TABLET | Freq: Every day | ORAL | Status: DC
  Administered 2017-08-28: 25 mg via ORAL
  Filled 2017-08-28: qty 1

## 2017-08-28 NOTE — ED Notes (Signed)
Patient transported to Ultrasound 

## 2017-08-28 NOTE — ED Provider Notes (Signed)
22:15: Assumed care for [facility] PA-C pending [place].   Patient is a 22 year old female who presented to the emergency department complaining of intermittent abdominal discomfort and nausea. Was noted to have beta hCG of 102,068. Lab work otherwise noted to reveal hbg of 10.9- consistent with patient's baseline. Signed out to me pending OB/transvaginal US  US Ob Comp < 14 Wks  Result Date: 08/28/2017 CLINICAL DATA:  Lower abdominal pain for 2 weeks. Positive pregnancy test. Unknown last menstrual period. Quantitative beta HCG is 102,068 EXAM: OBSTETRIC <14 WK [place] AND TRANSVAGINAL OB US TECHNIQUE: Both transabdominal and transvaginal ultrasound examinations were performed for complete evaluation of the gestation as well as the maternal uterus, adnexal regions, and pelvic cul-de-sac. Transvaginal technique was performed to assess early pregnancy. COMPARISON:  None. FINDINGS: Intrauterine gestational sac: A single intrauterine gestational sac is present. Yolk sac:  Yolk sac is present. Embryo:  Fetal pole is demonstrated. Cardiac Activity: Fetal cardiac activity is observed. Heart Rate: 158  bpm CRL:  13  mm   7 w   4 d                  [place] EDC: 04/09/2018 Subchorionic hemorrhage: A small subchorionic hemorrhage is demonstrated inferiorly. Maternal uterus/adnexae: Uterus is anteverted. No myometrial mass lesions are identified. Both ovaries are visualized. Right hip there is only seen transabdominally. There is a complex cyst measuring 2.6 cm maximal diameter. This could represent a functional cyst or corpus luteal cyst. The left ovary appears normal. There is a simple appearing paraovarian cyst measuring 3.1 cm maximal diameter. No free fluid in the pelvis. IMPRESSION: Single intrauterine pregnancy. Estimated gestational age by LMP is 7 weeks 4 days. A small subchorionic hemorrhage is demonstrated. Bilateral adnexal cyst, likely benign. Electronically Signed   By: Burman Nieves M.D.   On: 08/28/2017 23:02   US  Ob Transvaginal  Result Date: 08/28/2017 CLINICAL DATA:  Lower abdominal pain for 2 weeks. Positive pregnancy test. Unknown last menstrual period. Quantitative beta HCG is 102,068 EXAM: OBSTETRIC <14 WK [place] AND TRANSVAGINAL OB US TECHNIQUE: Both transabdominal and transvaginal ultrasound examinations were performed for complete evaluation of the gestation as well as the maternal uterus, adnexal regions, and pelvic cul-de-sac. Transvaginal technique was performed to assess early pregnancy. COMPARISON:  None. FINDINGS: Intrauterine gestational sac: A single intrauterine gestational sac is present. Yolk sac:  Yolk sac is present. Embryo:  Fetal pole is demonstrated. Cardiac Activity: Fetal cardiac activity is observed. Heart Rate: 158  bpm CRL:  13  mm   7 w   4 d                  [place] EDC: 04/09/2018 Subchorionic hemorrhage: A small subchorionic hemorrhage is demonstrated inferiorly. Maternal uterus/adnexae: Uterus is anteverted. No myometrial mass lesions are identified. Both ovaries are visualized. Right hip there is only seen transabdominally. There is a complex cyst measuring 2.6 cm maximal diameter. This could represent a functional cyst or corpus luteal cyst. The left ovary appears normal. There is a simple appearing paraovarian cyst measuring 3.1 cm maximal diameter. No free fluid in the pelvis. IMPRESSION: Single intrauterine pregnancy. Estimated gestational age by LMP is 7 weeks 4 days. A small subchorionic hemorrhage is demonstrated. Bilateral adnexal cyst, likely benign. Electronically Signed   By: Burman Nieves M.D.   On: 08/28/2017 23:02   Vitals:   08/28/17 1503 08/28/17 2004  BP: 135/74 126/71  Pulse: 76 75  Resp: 18 18  Temp: 98.8  F (37.1 C)   SpO2: 100% 100%    23:50: Re-eval: Patient resting comfortably in bed without complaints. Non-tender abdomen on repeat exam, patient is tolerating PO. I discussed with her the results of her ultrasound, need for OB follow-up, and need to start  prenatal vitamins. Also discussed return precautions. Provided opportunity for questions, patient confirmed understanding and is in agreement with plan.    Inez Rosato, SamanthaCherly Anderson R, PA-C 08/29/17 [id number]    Charlynne PanderYao, David Hsienta, MD 08/31/17 1501

## 2017-08-28 NOTE — Discharge Instructions (Signed)
Your ultrasound revealed a intrauterine pregnancy at 7 weeks and 4 days gestation.  There is a small subchorionic hemorrhage this is a common cause for spotting type bleeding in pregnancy. You also have cysts near your ovaries that are likely benign.   You will need to call the women's health office provided to schedule your initial prenatal visit.   Start taking a prenatal vitamin, you may ask the pharmacist at the drug store for recommendations if having difficulty. Do not drink or use tobacco products.   Return to the emergency department a if you develop new or worsening symptoms including but not limited to abdominal pain, vaginal bleeding like a period, passing out, or anything else concerning.

## 2017-08-28 NOTE — ED Triage Notes (Signed)
Pt. Stated, Ive had abdominal pain with some nausea, cough and headache for 2 weeks.

## 2017-08-28 NOTE — ED Provider Notes (Signed)
[facility] Provider Note   CSN: [id number] Arrival date & time: 08/28/17  1453     History   Chief Complaint Chief Complaint  Patient presents with  . Abdominal Pain  . Cough  . Nausea  . Headache    HPI Cala BradfordKazual Ellingwood is a 22 y.o. female.  The history is provided by the patient and medical records. No language interpreter was used.  Abdominal Pain   Associated symptoms include nausea and headaches. Pertinent negatives include diarrhea, vomiting and constipation.  Cough  Associated symptoms include headaches.  Headache   Associated symptoms include nausea. Pertinent negatives include no vomiting.   Cala BradfordKazual Weaber is a 22 y.o. female  who presents to the Emergency Department complaining of intermittent lower abdominal pain times 2 weeks associated with nausea and intermittent headache.  No emesis.  Denies alleviating or aggravating factors.  She has taken no medications prior to arrival for symptoms.  She reports that her last menstrual cycle was the beginning of December.  She typically has 4-5-day cycle, however in December she spotted for a day or 2 which was very atypical.  She is sexually active, on Sprintec for birth control.  No fever or chills.  No chest pain, back pain or shortness of breath.  No vaginal discharge, dysuria, urinary urgency/frequency.   Past Medical History:  Diagnosis Date  . Anxiety   . Depression   . Gastritis    2 bouts  . Migraine   . Psoriasis   . Vision abnormalities    wears glasses    Patient Active Problem List   Diagnosis Date Noted  . Gestation size to date discrepancy 04/16/2016  . BMI 45.0-49.9, adult (HCC) 03/15/2016    Past Surgical History:  Procedure Laterality Date  . ESOPHAGOGASTRODUODENOSCOPY N/A 10/17/2012   Procedure: ESOPHAGOGASTRODUODENOSCOPY (EGD);  Surgeon: Jon GillsJoseph H Clark, MD;  [facility];  Service: Gastroenterology;  Laterality: N/A;    OB History    Gravida Para Term  Preterm AB Living   1 1 1     1    SAB TAB Ectopic Multiple Live Births         0 1       Home Medications    Prior to Admission medications   Medication Sig Start Date End Date Taking? Authorizing Provider  azithromycin (ZITHROMAX) 250 MG tablet Take 1 tablet (250 mg total) by mouth daily. Take first 2 tablets together, then 1 every day until finished. 07/25/17   Blue, Olivia C, PA-C  benzonatate (TESSALON) 100 MG capsule Take 1 capsule (100 mg total) by mouth every 8 (eight) hours. 07/22/17   Blue, Olivia C, PA-C  cetirizine (ZYRTEC ALLERGY) 10 MG tablet Take 1 tablet (10 mg total) by mouth daily. 12/29/16   Everlene Farrieransie, William, PA-C  cyclobenzaprine (FLEXERIL) 5 MG tablet Take 1 tablet (5 mg total) by mouth 3 (three) times daily as needed for muscle spasms. Patient not taking: Reported on 08/01/2016 04/13/16   Berton BonMikell, Asiyah Zahra, MD  erythromycin ophthalmic ointment Place 1 application into the right eye every 6 (six) hours. Place 1/2 inch ribbon of ointment in the affected eye 4 times a day 07/24/17   Everlene Farrieransie, William, PA-C  fluticasone [facility](FLONASE) 50 MCG/ACT nasal spray Place 2 sprays into both nostrils daily. 07/24/17   Everlene Farrieransie, William, PA-C  ibuprofen (ADVIL,MOTRIN) 800 MG tablet Take 1 tablet (800 mg total) by mouth every 6 (six) hours as needed. 07/22/17   Blue, Marylene Landlivia C, PA-C  montelukast (SINGULAIR) 10 MG tablet Take 1 tablet (10 mg total) by mouth at bedtime. Patient not taking: Reported on 08/01/2016 02/21/16   Jessica Priest, MD  naproxen (NAPROSYN) 250 MG tablet Take 1 tablet (250 mg total) by mouth 2 (two) times daily with a meal. 12/29/16   Everlene Farrier, PA-C  norgestimate-ethinyl estradiol (ORTHO-CYCLEN,SPRINTEC,PREVIFEM) 0.25-35 MG-MCG tablet Take 1 tablet by mouth daily. 08/01/16   Aviva Signs, CNM  ondansetron (ZOFRAN ODT) 4 MG disintegrating tablet Take 1 tablet (4 mg total) by mouth every 8 (eight) hours as needed for nausea or vomiting. 12/29/16   Everlene Farrier, PA-C    ondansetron (ZOFRAN) 4 MG tablet Take 1 tablet (4 mg total) by mouth every 8 (eight) hours as needed for nausea or vomiting. 07/22/17   Blue, Marylene Land, PA-C  Prenatal Vit-Fe Fumarate-FA (PRENATAL MULTIVITAMIN) TABS tablet Take 1 tablet by mouth daily at 12 noon.    [provider]    Family History Family History  Problem Relation Age of Onset  . Cholelithiasis Mother   . Hyperlipidemia Mother   . Hypertension Mother   . Mental illness Mother   . Depression Mother   . Cholelithiasis Maternal Uncle   . Hyperlipidemia Maternal Uncle   . Hypertension Maternal Uncle   . Cholelithiasis Maternal Grandmother   . Arthritis Maternal Grandmother   . COPD Maternal Grandmother   . Depression Maternal Grandmother   . Diabetes Maternal Grandmother   . Heart disease Maternal Grandmother   . Hyperlipidemia Maternal Grandmother   . Hypertension Maternal Grandmother   . Learning disabilities Paternal Grandmother   . Mental illness Paternal Grandmother   . Hyperlipidemia Paternal Grandfather   . Diabetes Paternal Grandfather   . Ulcers Neg Hx   . Alcohol abuse Neg Hx   . Asthma Neg Hx   . Birth defects Neg Hx   . Cancer Neg Hx   . Drug abuse Neg Hx   . Early death Neg Hx   . Hearing loss Neg Hx   . Kidney disease Neg Hx   . Mental retardation Neg Hx   . Miscarriages / Stillbirths Neg Hx   . Stroke Neg Hx   . Vision loss Neg Hx     Social History Social History   Tobacco Use  . Smoking status: Never Smoker  . Smokeless tobacco: Never Used  Substance Use Topics  . Alcohol use: No  . Drug use: No     Allergies   Tramadol   Review of Systems Review of Systems  Gastrointestinal: Positive for abdominal pain and nausea. Negative for constipation, diarrhea and vomiting.  Neurological: Positive for headaches.  All other systems reviewed and are negative.    Physical Exam Updated Vital Signs BP 126/71 (BP Location: Right Arm)   Pulse 75   Temp 98.8 F (37.1 C)  (Oral)   Resp 18   Ht 5\' 4"  (1.626 m)   Wt 134.3 kg (296 lb)   LMP 07/29/2017   SpO2 100%   BMI 50.81 kg/m   Physical Exam  Constitutional: She is oriented to person, place, and time. She appears well-developed and well-nourished. No distress.  HENT:  Head: Normocephalic and atraumatic.  Cardiovascular: Normal rate, regular rhythm and normal heart sounds.  No murmur heard. Pulmonary/Chest: Effort normal and breath sounds normal. No respiratory distress.  Abdominal: Soft. She exhibits no distension.  Suprapubic tenderness with no rebound or guarding. No CVA tenderness.  Musculoskeletal: She exhibits no edema.  Neurological: She  is alert and oriented to person, place, and time.  Skin: Skin is warm and dry.  Nursing note and vitals reviewed.    ED Treatments / Results  Labs (all labs ordered are listed, but only abnormal results are displayed) Labs Reviewed  LIPASE, BLOOD - Abnormal; Notable for the following components:      Result Value   Lipase 60 (*)    All other components within normal limits  COMPREHENSIVE METABOLIC PANEL - Abnormal; Notable for the following components:   Sodium 133 (*)    CO2 21 (*)    Calcium 8.6 (*)    Total Protein 6.3 (*)    Albumin 3.3 (*)    Total Bilirubin 0.2 (*)    All other components within normal limits  CBC - Abnormal; Notable for the following components:   Hemoglobin 10.9 (*)    MCV 73.6 (*)    MCH 22.3 (*)    RDW 16.8 (*)    All other components within normal limits  HCG, QUANTITATIVE, PREGNANCY - Abnormal; Notable for the following components:   hCG, Beta Chain, Quant, S 102,068 (*)    All other components within normal limits  I-STAT BETA HCG BLOOD, ED ([facility], [facility], [facility] ONLY) - Abnormal; Notable for the following components:   I-stat hCG, quantitative >2,000.0 (*)    All other components within normal limits  URINALYSIS, ROUTINE W REFLEX MICROSCOPIC    EKG  EKG Interpretation None       Radiology No results  found.  Procedures Procedures (including critical care time)  Medications Ordered in ED Medications  pyridOXINE (VITAMIN B-6) tablet 25 mg (25 mg Oral Given 08/28/17 2001)  acetaminophen (TYLENOL) tablet 325 mg (325 mg Oral Given 08/28/17 2001)     Initial Impression / Assessment and Plan / ED Course  I have reviewed the triage vital signs and the nursing notes.  Pertinent labs & imaging results that were available during my care of the patient were reviewed by me and considered in my medical decision making (see chart for details).    Anhthu Perdew is a 22 y.o. female who presents to ED for lower abdominal pain and nausea. Pregnancy test +. Labs reviewed and reassuring. hgb of 10.9. UA negative. Quant hcg of 102, 068. Will obtain ultrasound for further evaluation and to confirm IUP.   Ultrasound pending at shift change. Care assumed by oncoming provider PA Petrucelli. Case discussed, plan agreed upon. Will follow up on pending ultrasound. If IUP with no complications, discharge home with OBGYN follow up and prenatal vitamins with iron.    Final Clinical Impressions(s) / ED Diagnoses   Final diagnoses:  None    ED Discharge Orders    None       Ward, Chase Picket, PA-C 08/28/17 2226    Nira Conn, MD 08/31/17 [phone]

## 2017-09-16 HISTORY — PX: THERAPEUTIC ABORTION: SHX798

## 2017-09-19 ENCOUNTER — Inpatient Hospital Stay (EMERGENCY_DEPARTMENT_HOSPITAL)
Admission: AD | Admit: 2017-09-19 | Discharge: 2017-09-20 | Disposition: A | Discharge status: Home / Self Care | Payer: 59 | Source: Ambulatory Visit | Attending: Obstetrics and Gynecology | Admitting: Obstetrics and Gynecology

## 2017-09-19 DIAGNOSIS — O074 Failed attempted termination of pregnancy without complication: Secondary | ICD-10-CM

## 2017-09-19 DIAGNOSIS — O0380 Unspecified complication following complete or unspecified spontaneous abortion: Secondary | ICD-10-CM

## 2017-09-19 DIAGNOSIS — O034 Incomplete spontaneous abortion without complication: Secondary | ICD-10-CM | POA: Diagnosis present

## 2017-09-19 NOTE — MAU Note (Signed)
Pt here with c/o pain and "spasms in the uterus" since the 21st. Had a [facility] (at 10.3 weeks) [facility]

## 2017-09-20 ENCOUNTER — Other Ambulatory Visit: Payer: Self-pay

## 2017-09-20 ENCOUNTER — Encounter (HOSPITAL_COMMUNITY): Payer: Self-pay | Admitting: Certified Registered Nurse Anesthetist

## 2017-09-20 ENCOUNTER — Encounter (HOSPITAL_COMMUNITY)
Admission: AD | Disposition: A | Discharge status: Home / Self Care | Payer: Self-pay | Source: Ambulatory Visit | Attending: Obstetrics and Gynecology

## 2017-09-20 ENCOUNTER — Ambulatory Visit (HOSPITAL_COMMUNITY): Payer: 59 | Admitting: Anesthesiology

## 2017-09-20 ENCOUNTER — Encounter (HOSPITAL_COMMUNITY): Payer: Self-pay

## 2017-09-20 ENCOUNTER — Ambulatory Visit (HOSPITAL_COMMUNITY)
Admission: AD | Admit: 2017-09-20 | Discharge: 2017-09-20 | Disposition: A | Discharge status: Home / Self Care | Payer: 59 | Source: Ambulatory Visit | Attending: Obstetrics and Gynecology | Admitting: Obstetrics and Gynecology

## 2017-09-20 ENCOUNTER — Inpatient Hospital Stay (HOSPITAL_COMMUNITY): Payer: 59

## 2017-09-20 DIAGNOSIS — O034 Incomplete spontaneous abortion without complication: Secondary | ICD-10-CM | POA: Diagnosis not present

## 2017-09-20 DIAGNOSIS — Z9889 Other specified postprocedural states: Secondary | ICD-10-CM

## 2017-09-20 DIAGNOSIS — O074 Failed attempted termination of pregnancy without complication: Secondary | ICD-10-CM | POA: Diagnosis not present

## 2017-09-20 HISTORY — DX: Bipolar disorder, unspecified: F31.9

## 2017-09-20 HISTORY — PX: DILATION AND EVACUATION: SHX1459

## 2017-09-20 HISTORY — DX: Allergy status to unspecified drugs, medicaments and biological substances: Z88.9

## 2017-09-20 HISTORY — DX: Anemia, unspecified: D64.9

## 2017-09-20 LAB — CBC WITH DIFFERENTIAL/PLATELET
Basophils Absolute: 0 10*3/uL (ref 0.0–0.1)
Basophils Relative: 0 %
Eosinophils Absolute: 0.2 10*3/uL (ref 0.0–0.7)
Eosinophils Relative: 2 %
HEMATOCRIT: 34.1 % — AB (ref 36.0–46.0)
Hemoglobin: 10.6 g/dL — ABNORMAL LOW (ref 12.0–15.0)
LYMPHS PCT: 42 %
Lymphs Abs: 4 10*3/uL (ref 0.7–4.0)
MCH: 23.6 pg — ABNORMAL LOW (ref 26.0–34.0)
MCHC: 31.1 g/dL (ref 30.0–36.0)
MCV: 75.8 fL — AB (ref 78.0–100.0)
MONO ABS: 0.4 10*3/uL (ref 0.1–1.0)
MONOS PCT: 4 %
NEUTROS ABS: 5 10*3/uL (ref 1.7–7.7)
Neutrophils Relative %: 52 %
Platelets: 249 10*3/uL (ref 150–400)
RBC: 4.5 MIL/uL (ref 3.87–5.11)
RDW: 17 % — AB (ref 11.5–15.5)
WBC: 9.6 10*3/uL (ref 4.0–10.5)

## 2017-09-20 LAB — COMPREHENSIVE METABOLIC PANEL
ALK PHOS: 64 U/L (ref 38–126)
ALT: 13 U/L — ABNORMAL LOW (ref 14–54)
AST: 12 U/L — AB (ref 15–41)
Albumin: 3.3 g/dL — ABNORMAL LOW (ref 3.5–5.0)
Anion gap: 8 (ref 5–15)
BILIRUBIN TOTAL: 0.3 mg/dL (ref 0.3–1.2)
BUN: 7 mg/dL (ref 6–20)
CALCIUM: 8.3 mg/dL — AB (ref 8.9–10.3)
CO2: 23 mmol/L (ref 22–32)
CREATININE: 0.59 mg/dL (ref 0.44–1.00)
Chloride: 105 mmol/L (ref 101–111)
GFR calc Af Amer: >60 mL/min (ref >60)
Glucose, Bld: 112 mg/dL — ABNORMAL HIGH (ref 65–99)
POTASSIUM: 3.7 mmol/L (ref 3.5–5.1)
Sodium: 136 mmol/L (ref 135–145)
TOTAL PROTEIN: 6.8 g/dL (ref 6.5–8.1)

## 2017-09-20 LAB — TYPE AND SCREEN
ABO/RH(D): A POS
ANTIBODY SCREEN: NEGATIVE

## 2017-09-20 LAB — URINALYSIS, ROUTINE W REFLEX MICROSCOPIC
Bilirubin Urine: NEGATIVE
GLUCOSE, UA: NEGATIVE mg/dL
Ketones, ur: NEGATIVE mg/dL
NITRITE: NEGATIVE
PH: 5 (ref 5.0–8.0)
Protein, ur: >=300 mg/dL — AB
Specific Gravity, Urine: 1.027 (ref 1.005–1.030)
Squamous Epithelial / LPF: NONE SEEN

## 2017-09-20 LAB — WET PREP, GENITAL
SPERM: NONE SEEN
TRICH WET PREP: NONE SEEN
YEAST WET PREP: NONE SEEN

## 2017-09-20 LAB — HIV ANTIBODY (ROUTINE TESTING W REFLEX): HIV SCREEN 4TH GENERATION: NONREACTIVE

## 2017-09-20 SURGERY — DILATION AND EVACUATION, UTERUS
Anesthesia: General

## 2017-09-20 MED ORDER — PROPOFOL 10 MG/ML IV BOLUS
INTRAVENOUS | Status: AC
  Filled 2017-09-20: qty 40

## 2017-09-20 MED ORDER — MIDAZOLAM HCL 2 MG/2ML IJ SOLN
INTRAMUSCULAR | Status: AC
  Filled 2017-09-20: qty 2

## 2017-09-20 MED ORDER — OXYCODONE-ACETAMINOPHEN 5-325 MG PO TABS: 1.0000 | ORAL_TABLET | Freq: Four times a day (QID) | ORAL | 0 refills | Status: DC | PRN

## 2017-09-20 MED ORDER — HYDROMORPHONE HCL 1 MG/ML IJ SOLN
1.0000 mg | Freq: Once | INTRAMUSCULAR | Status: DC
  Filled 2017-09-20: qty 1

## 2017-09-20 MED ORDER — LACTATED RINGERS IV SOLN
INTRAVENOUS | Status: DC
  Administered 2017-09-20: 10:00:00 via INTRAVENOUS

## 2017-09-20 MED ORDER — FENTANYL CITRATE (PF) 100 MCG/2ML IJ SOLN
25.0000 ug | INTRAMUSCULAR | Status: DC | PRN
  Administered 2017-09-20: 25 ug via INTRAVENOUS

## 2017-09-20 MED ORDER — LIDOCAINE HCL 1 % IJ SOLN
INTRAMUSCULAR | Status: AC
  Filled 2017-09-20: qty 20

## 2017-09-20 MED ORDER — SCOPOLAMINE 1 MG/3DAYS TD PT72
1.0000 | MEDICATED_PATCH | Freq: Once | TRANSDERMAL | Status: DC
  Administered 2017-09-20: 1.5 mg via TRANSDERMAL

## 2017-09-20 MED ORDER — SCOPOLAMINE 1 MG/3DAYS TD PT72
MEDICATED_PATCH | TRANSDERMAL | Status: AC
  Filled 2017-09-20: qty 1

## 2017-09-20 MED ORDER — LIDOCAINE HCL (CARDIAC) 20 MG/ML IV SOLN
INTRAVENOUS | Status: DC | PRN
  Administered 2017-09-20: 50 mg via INTRAVENOUS

## 2017-09-20 MED ORDER — LIDOCAINE HCL 1 % IJ SOLN
INTRAMUSCULAR | Status: DC | PRN
Start: 2017-09-20 — End: 2017-09-20
  Administered 2017-09-20: 14 mL

## 2017-09-20 MED ORDER — DOXYCYCLINE HYCLATE 100 MG PO TABS
100.0000 mg | ORAL_TABLET | Freq: Once | ORAL | Status: AC
  Administered 2017-09-20: 100 mg via ORAL
  Filled 2017-09-20: qty 1

## 2017-09-20 MED ORDER — MEPERIDINE HCL 25 MG/ML IJ SOLN: 6.2500 mg | INTRAMUSCULAR | Status: DC | PRN

## 2017-09-20 MED ORDER — KETOROLAC TROMETHAMINE 30 MG/ML IJ SOLN
INTRAMUSCULAR | Status: AC
  Filled 2017-09-20: qty 1

## 2017-09-20 MED ORDER — SILVER NITRATE-POT NITRATE 75-25 % EX MISC
CUTANEOUS | Status: DC | PRN
Start: 2017-09-20 — End: 2017-09-20
  Administered 2017-09-20: 5 via TOPICAL

## 2017-09-20 MED ORDER — OXYCODONE HCL 5 MG PO TABS: 5.0000 mg | ORAL_TABLET | Freq: Once | ORAL | Status: DC | PRN

## 2017-09-20 MED ORDER — DIPHENHYDRAMINE HCL 50 MG/ML IJ SOLN
50.0000 mg | Freq: Once | INTRAMUSCULAR | Status: AC
  Administered 2017-09-20: 50 mg via INTRAVENOUS

## 2017-09-20 MED ORDER — ONDANSETRON HCL 4 MG/2ML IJ SOLN
INTRAMUSCULAR | Status: DC | PRN
  Administered 2017-09-20: 4 mg via INTRAVENOUS

## 2017-09-20 MED ORDER — DEXAMETHASONE SODIUM PHOSPHATE 4 MG/ML IJ SOLN
INTRAMUSCULAR | Status: AC
  Filled 2017-09-20: qty 1

## 2017-09-20 MED ORDER — ONDANSETRON HCL 4 MG/2ML IJ SOLN
INTRAMUSCULAR | Status: AC
  Filled 2017-09-20: qty 2

## 2017-09-20 MED ORDER — DIPHENHYDRAMINE HCL 50 MG/ML IJ SOLN
INTRAMUSCULAR | Status: AC
  Administered 2017-09-20: 50 mg via INTRAVENOUS
  Filled 2017-09-20: qty 1

## 2017-09-20 MED ORDER — LIDOCAINE HCL (CARDIAC) 20 MG/ML IV SOLN
INTRAVENOUS | Status: AC
  Filled 2017-09-20: qty 5

## 2017-09-20 MED ORDER — ONDANSETRON 8 MG PO TBDP
8.0000 mg | ORAL_TABLET | Freq: Once | ORAL | Status: AC
  Administered 2017-09-20: 8 mg via ORAL
  Filled 2017-09-20: qty 1

## 2017-09-20 MED ORDER — ACETAMINOPHEN 160 MG/5ML PO SOLN: 325.0000 mg | ORAL | Status: DC | PRN

## 2017-09-20 MED ORDER — FENTANYL CITRATE (PF) 100 MCG/2ML IJ SOLN
INTRAMUSCULAR | Status: AC
  Filled 2017-09-20: qty 2

## 2017-09-20 MED ORDER — OXYCODONE HCL 5 MG/5ML PO SOLN: 5.0000 mg | Freq: Once | ORAL | Status: DC | PRN

## 2017-09-20 MED ORDER — ACETAMINOPHEN 325 MG PO TABS: 325.0000 mg | ORAL_TABLET | ORAL | Status: DC | PRN

## 2017-09-20 MED ORDER — KETOROLAC TROMETHAMINE 30 MG/ML IJ SOLN: 30.0000 mg | Freq: Once | INTRAMUSCULAR | Status: DC | PRN

## 2017-09-20 MED ORDER — PROPOFOL 10 MG/ML IV BOLUS
INTRAVENOUS | Status: DC | PRN
  Administered 2017-09-20: 200 mg via INTRAVENOUS

## 2017-09-20 MED ORDER — MIDAZOLAM HCL 2 MG/2ML IJ SOLN
INTRAMUSCULAR | Status: DC | PRN
  Administered 2017-09-20 (×2): 2 mg via INTRAVENOUS

## 2017-09-20 MED ORDER — HYDROMORPHONE HCL 1 MG/ML IJ SOLN
1.0000 mg | Freq: Once | INTRAMUSCULAR | Status: AC
  Administered 2017-09-20: 1 mg via INTRAMUSCULAR

## 2017-09-20 MED ORDER — OXYCODONE-ACETAMINOPHEN 5-325 MG PO TABS
2.0000 | ORAL_TABLET | Freq: Once | ORAL | Status: AC
  Administered 2017-09-20: 2 via ORAL
  Filled 2017-09-20: qty 2

## 2017-09-20 MED ORDER — FENTANYL CITRATE (PF) 250 MCG/5ML IJ SOLN
INTRAMUSCULAR | Status: AC
  Filled 2017-09-20: qty 5

## 2017-09-20 MED ORDER — KETOROLAC TROMETHAMINE 30 MG/ML IJ SOLN
INTRAMUSCULAR | Status: DC | PRN
  Administered 2017-09-20: 30 mg via INTRAVENOUS

## 2017-09-20 MED ORDER — FENTANYL CITRATE (PF) 100 MCG/2ML IJ SOLN
INTRAMUSCULAR | Status: DC | PRN
  Administered 2017-09-20: 50 ug via INTRAVENOUS
  Administered 2017-09-20 (×2): 100 ug via INTRAVENOUS

## 2017-09-20 MED ORDER — DOXYCYCLINE HYCLATE 100 MG IV SOLR
200.0000 mg | INTRAVENOUS | Status: AC
  Administered 2017-09-20: 200 mg via INTRAVENOUS
  Filled 2017-09-20: qty 100

## 2017-09-20 MED ORDER — ONDANSETRON HCL 4 MG/2ML IJ SOLN: 4.0000 mg | Freq: Once | INTRAMUSCULAR | Status: DC | PRN

## 2017-09-20 MED ORDER — DEXAMETHASONE SODIUM PHOSPHATE 10 MG/ML IJ SOLN
INTRAMUSCULAR | Status: DC | PRN
  Administered 2017-09-20: 4 mg via INTRAVENOUS

## 2017-09-20 SURGICAL SUPPLY — 20 items
CATH ROBINSON RED A/P 16FR (CATHETERS) ×3 IMPLANT
CNTNR SPEC C3OZ STD GRAD LEK (MISCELLANEOUS) ×1 IMPLANT
CONT SPEC 3OZ W/LID STRL (MISCELLANEOUS) ×3
GLOVE BIOGEL PI IND STRL 7.0 (GLOVE) ×1 IMPLANT
GLOVE BIOGEL PI IND STRL 7.5 (GLOVE) ×1 IMPLANT
GLOVE BIOGEL PI INDICATOR 7.0 (GLOVE) ×2
GLOVE BIOGEL PI INDICATOR 7.5 (GLOVE) ×2
GLOVE SURG SS PI 7.0 STRL IVOR (GLOVE) ×3 IMPLANT
GOWN STRL REUS W/TWL LRG LVL3 (GOWN DISPOSABLE) ×3 IMPLANT
GOWN STRL REUS W/TWL XL LVL3 (GOWN DISPOSABLE) ×3 IMPLANT
HOSE CONNECTING 18IN BERKELEY (TUBING) ×2 IMPLANT
KIT BERKELEY 1ST TRIMESTER 3/8 (MISCELLANEOUS) ×2 IMPLANT
NS IRRIG 1000ML POUR BTL (IV SOLUTION) ×3 IMPLANT
PACK VAGINAL MINOR WOMEN LF (CUSTOM PROCEDURE TRAY) ×3 IMPLANT
PAD OB MATERNITY 4.3X12.25 (PERSONAL CARE ITEMS) ×3 IMPLANT
PAD PREP 24X48 CUFFED NSTRL (MISCELLANEOUS) ×3 IMPLANT
SET BERKELEY SUCTION TUBING (SUCTIONS) ×2 IMPLANT
TOWEL OR 17X24 6PK STRL BLUE (TOWEL DISPOSABLE) ×6 IMPLANT
VACURETTE 10 RIGID CVD (CANNULA) ×2 IMPLANT
VACURETTE 9 RIGID CVD (CANNULA) ×2 IMPLANT

## 2017-09-20 NOTE — H&P (Signed)
H&P reviewed and no changes. Pt amenable to suction d&c. Can proceed when OR is ready.  Traci Castro, Jr MD Attending [facility] 09/20/2017 Time: [phone]

## 2017-09-20 NOTE — OR Nursing (Signed)
IN to relieve Janene HarveyKarol Murray to start IV only.   Pt c/o severe itching at psoriasis sites all over body due to CHG.  AAOx3 but scratching skin off in extreme discomfort .  Dr Tacy Duraddono notified.  Obtained VO for IV benadryl.  50mg  given IVP.  [facility].  All spots of psoriasis BRIGHT red and weeping and rash all over back. Denies SOB, throat swelling and diffulculty breathing.  SR up x2.  [facility].Dr. Vergie LivingPickens notified.  Will try to get pt back to OR a little early due to extreme itching/discomfort.  Pt c VSS en route to OR.

## 2017-09-20 NOTE — Anesthesia Procedure Notes (Signed)
Procedure Name: LMA Insertion Date/Time: 09/20/2017 10:31 AM Performed by: Shanon PayorGregory, Aron Needles M, CRNA Pre-anesthesia Checklist: Patient identified, Emergency Drugs available, Suction available, Patient being monitored and Timeout performed Patient Re-evaluated:Patient Re-evaluated prior to induction Oxygen Delivery Method: Circle system utilized Preoxygenation: Pre-oxygenation with 100% oxygen Induction Type: IV induction LMA: LMA inserted LMA Size: 4.0 Number of attempts: 1 Placement Confirmation: positive ETCO2 and breath sounds checked- equal and bilateral Tube secured with: Tape Dental Injury: Teeth and Oropharynx as per pre-operative assessment

## 2017-09-20 NOTE — MAU Provider Note (Signed)
Chief Complaint:  Abdominal Pain and Vaginal Bleeding   First Provider Initiated Contact with Patient 09/20/17 0011      HPI: Traci Castro is a 22 y.o. G1P1001 who presents to maternity admissions reporting cramping in uterus since EAB on 09/16/17.  States pain has gotten worse each day.  Ibuprofen is not helping. . She reports vaginal bleeding, but no vaginal itching/burning, urinary symptoms, h/a, dizziness, n/v, or fever/chills.    Abdominal Pain  This is a new problem. The current episode started in the past 7 days. The onset quality is gradual. The problem occurs intermittently. The problem has been gradually worsening. The pain is located in the suprapubic region, LLQ and RLQ. The quality of the pain is cramping. The abdominal pain does not radiate. Pertinent negatives include no constipation, diarrhea, dysuria, fever, headaches, myalgias, nausea or vomiting. Nothing aggravates the pain. The pain is relieved by nothing. Treatments tried: ibuprofen. The treatment provided no relief.  Vaginal Bleeding  The patient's primary symptoms include pelvic pain and vaginal bleeding. The patient's pertinent negatives include no genital itching, genital lesions or genital odor. This is a new problem. The current episode started in the past 7 days. The problem occurs constantly. She is not pregnant. Associated symptoms include abdominal pain. Pertinent negatives include no constipation, diarrhea, dysuria, fever, headaches, nausea or vomiting. The vaginal discharge was bloody. The vaginal bleeding is lighter than menses. She has not been passing clots. She has not been passing tissue. Nothing aggravates the symptoms.   RN Note: Pt here with c/o pain and "spasms in the uterus" since the 21st. Had a TAB (at 10.3 weeks) that day at the [facility]    Past Medical History: Past Medical History:  Diagnosis Date  . Anxiety   . Depression   . Gastritis    2 bouts  . Migraine   . Psoriasis   .  Vision abnormalities    wears glasses    Past obstetric history: OB History  Gravida Para Term Preterm AB Living  1 1 1     1   SAB TAB Ectopic Multiple Live Births        0 1    # Outcome Date GA Lbr Len/2nd Weight Sex Delivery Anes PTL Lv  1 Term 05/05/16 [redacted]w[redacted]d 03:05 / 00:50 7 lb 6.2 oz (3.351 kg) M Vag-Spont Local  LIV      Past Surgical History: Past Surgical History:  Procedure Laterality Date  . ESOPHAGOGASTRODUODENOSCOPY N/A 10/17/2012   Procedure: ESOPHAGOGASTRODUODENOSCOPY (EGD);  Surgeon: Jon Gills, MD;  Location: [facility] OR;  Service: Gastroenterology;  Laterality: N/A;    Family History: Family History  Problem Relation Age of Onset  . Cholelithiasis Mother   . Hyperlipidemia Mother   . Hypertension Mother   . Mental illness Mother   . Depression Mother   . Cholelithiasis Maternal Uncle   . Hyperlipidemia Maternal Uncle   . Hypertension Maternal Uncle   . Cholelithiasis Maternal Grandmother   . Arthritis Maternal Grandmother   . COPD Maternal Grandmother   . Depression Maternal Grandmother   . Diabetes Maternal Grandmother   . Heart disease Maternal Grandmother   . Hyperlipidemia Maternal Grandmother   . Hypertension Maternal Grandmother   . Learning disabilities Paternal Grandmother   . Mental illness Paternal Grandmother   . Hyperlipidemia Paternal Grandfather   . Diabetes Paternal Grandfather   . Ulcers Neg Hx   . Alcohol abuse Neg Hx   . Asthma Neg Hx   .  Birth defects Neg Hx   . Cancer Neg Hx   . Drug abuse Neg Hx   . Early death Neg Hx   . Hearing loss Neg Hx   . Kidney disease Neg Hx   . Mental retardation Neg Hx   . Miscarriages / Stillbirths Neg Hx   . Stroke Neg Hx   . Vision loss Neg Hx     Social History: Social History   Tobacco Use  . Smoking status: Never Smoker  . Smokeless tobacco: Never Used  Substance Use Topics  . Alcohol use: No  . Drug use: No    Allergies:  Allergies  Allergen Reactions  . Tramadol Nausea And  Vomiting    Meds:  Medications Prior to Admission  Medication Sig Dispense Refill Last Dose  . azithromycin (ZITHROMAX) 250 MG tablet Take 1 tablet (250 mg total) by mouth daily. Take first 2 tablets together, then 1 every day until finished. 6 tablet 0   . benzonatate (TESSALON) 100 MG capsule Take 1 capsule (100 mg total) by mouth every 8 (eight) hours. 21 capsule 0   . cetirizine (ZYRTEC ALLERGY) 10 MG tablet Take 1 tablet (10 mg total) by mouth daily. 30 tablet 1   . cyclobenzaprine (FLEXERIL) 5 MG tablet Take 1 tablet (5 mg total) by mouth 3 (three) times daily as needed for muscle spasms. (Patient not taking: Reported on 08/01/2016) 30 tablet 0 Not Taking  . erythromycin ophthalmic ointment Place 1 application into the right eye every 6 (six) hours. Place 1/2 inch ribbon of ointment in the affected eye 4 times a day 1 g 1   . fluticasone (FLONASE) 50 MCG/ACT nasal spray Place 2 sprays into both nostrils daily. 16 g 0   . ibuprofen (ADVIL,MOTRIN) 800 MG tablet Take 1 tablet (800 mg total) by mouth every 6 (six) hours as needed. 30 tablet 0   . montelukast (SINGULAIR) 10 MG tablet Take 1 tablet (10 mg total) by mouth at bedtime. (Patient not taking: Reported on 08/01/2016) 30 tablet 5 Not Taking  . naproxen (NAPROSYN) 250 MG tablet Take 1 tablet (250 mg total) by mouth 2 (two) times daily with a meal. 30 tablet 0   . norgestimate-ethinyl estradiol (ORTHO-CYCLEN,SPRINTEC,PREVIFEM) 0.25-35 MG-MCG tablet Take 1 tablet by mouth daily. 1 Package 11   . ondansetron (ZOFRAN ODT) 4 MG disintegrating tablet Take 1 tablet (4 mg total) by mouth every 8 (eight) hours as needed for nausea or vomiting. 10 tablet 0   . ondansetron (ZOFRAN) 4 MG tablet Take 1 tablet (4 mg total) by mouth every 8 (eight) hours as needed for nausea or vomiting. 4 tablet 0   . Prenatal Vit-Fe Fumarate-FA (PRENATAL MULTIVITAMIN) TABS tablet Take 1 tablet by mouth daily at 12 noon.   Not Taking    I have reviewed patient's Past  Medical Hx, Surgical Hx, Family Hx, Social Hx, medications and allergies.  ROS:  Review of Systems  Constitutional: Negative for fever.  Gastrointestinal: Positive for abdominal pain. Negative for constipation, diarrhea, nausea and vomiting.  Genitourinary: Positive for pelvic pain and vaginal bleeding. Negative for dysuria.  Musculoskeletal: Negative for myalgias.  Neurological: Negative for headaches.   Other systems negative     Physical Exam   Patient Vitals for the past 24 hrs:  BP Temp Temp src Pulse Resp SpO2 Height Weight  09/19/17 2359 (!) 145/79 98.6 F (37 C) Oral 80 20 100 % 5\' 4"  (1.626 m) (!) 308 lb (139.7 kg)   Constitutional:  Well-developed, well-nourished female in no acute distress.  Cardiovascular: normal rate and rhythm Respiratory: normal effort, no distress.  GI: Abd soft, quite tender over lower abdomen.  Nondistended.  No rebound, some  guarding.  Bowel Sounds audible  MS: Extremities nontender, no edema, normal ROM Neurologic: Alert and oriented x 4.   Grossly nonfocal. GU: Neg CVAT. Skin:  Warm and Dry Psych:  Affect appropriate.  PELVIC EXAM: Cervix pink, visually closed, without lesion, scant pink  discharge, vaginal walls and external genitalia normal Bimanual exam: Difficult exam due to discomfort. Uterus enlarged and tender   Labs: Results for orders placed or performed during the hospital encounter of 09/19/17 (from the past 24 hour(s))  Urinalysis, Routine w reflex microscopic     Status: Abnormal   Collection Time: 09/19/17 11:51 PM  Result Value Ref Range   Color, Urine AMBER (A) YELLOW   APPearance CLOUDY (A) CLEAR   Specific Gravity, Urine 1.027 1.005 - 1.030   pH 5.0 5.0 - 8.0   Glucose, UA NEGATIVE NEGATIVE mg/dL   Hgb urine dipstick LARGE (A) NEGATIVE   Bilirubin Urine NEGATIVE NEGATIVE   Ketones, ur NEGATIVE NEGATIVE mg/dL   Protein, ur >=295 (A) NEGATIVE mg/dL   Nitrite NEGATIVE NEGATIVE   Leukocytes, UA SMALL (A) NEGATIVE    RBC / HPF TOO NUMEROUS TO COUNT 0 - 5 RBC/hpf   WBC, UA 6-30 0 - 5 WBC/hpf   Bacteria, UA RARE (A) NONE SEEN   Squamous Epithelial / LPF NONE SEEN NONE SEEN   WBC Clumps PRESENT    Mucus PRESENT   Wet prep, genital     Status: Abnormal   Collection Time: 09/20/17 12:15 AM  Result Value Ref Range   Yeast Wet Prep HPF POC NONE SEEN NONE SEEN   Trich, Wet Prep NONE SEEN NONE SEEN   Clue Cells Wet Prep HPF POC PRESENT (A) NONE SEEN   WBC, Wet Prep HPF POC FEW (A) NONE SEEN   Sperm NONE SEEN   CBC with Differential/Platelet     Status: Abnormal   Collection Time: 09/20/17 12:18 AM  Result Value Ref Range   WBC 9.6 4.0 - 10.5 K/uL   RBC 4.50 3.87 - 5.11 MIL/uL   Hemoglobin 10.6 (L) 12.0 - 15.0 g/dL   HCT 28.4 (L) 13.2 - 44.0 %   MCV 75.8 (L) 78.0 - 100.0 fL   MCH 23.6 (L) 26.0 - 34.0 pg   MCHC 31.1 30.0 - 36.0 g/dL   RDW 10.2 (H) 72.5 - 36.6 %   Platelets 249 150 - 400 K/uL   Neutrophils Relative % 52 %   Neutro Abs 5.0 1.7 - 7.7 K/uL   Lymphocytes Relative 42 %   Lymphs Abs 4.0 0.7 - 4.0 K/uL   Monocytes Relative 4 %   Monocytes Absolute 0.4 0.1 - 1.0 K/uL   Eosinophils Relative 2 %   Eosinophils Absolute 0.2 0.0 - 0.7 K/uL   Basophils Relative 0 %   Basophils Absolute 0.0 0.0 - 0.1 K/uL  Comprehensive metabolic panel     Status: Abnormal   Collection Time: 09/20/17 12:18 AM  Result Value Ref Range   Sodium 136 135 - 145 mmol/L   Potassium 3.7 3.5 - 5.1 mmol/L   Chloride 105 101 - 111 mmol/L   CO2 23 22 - 32 mmol/L   Glucose, Bld 112 (H) 65 - 99 mg/dL   BUN 7 6 - 20 mg/dL   Creatinine, Ser 4.40 0.44 - 1.00 mg/dL  Calcium 8.3 (L) 8.9 - 10.3 mg/dL   Total Protein 6.8 6.5 - 8.1 g/dL   Albumin 3.3 (L) 3.5 - 5.0 g/dL   AST 12 (L) 15 - 41 U/L   ALT 13 (L) 14 - 54 U/L   Alkaline Phosphatase 64 38 - 126 U/L   Total Bilirubin 0.3 0.3 - 1.2 mg/dL   GFR calc non Af Amer >60 >60 mL/min   GFR calc Af Amer >60 >60 mL/min   Anion gap 8 5 - 15       Imaging:  [place] Ob Comp < 14  Wks  Result Date: 08/28/2017 CLINICAL DATA:  Lower abdominal pain for 2 weeks. Positive pregnancy test. Unknown last menstrual period. Quantitative beta HCG is 102,068 EXAM: OBSTETRIC <14 WK US AND TRANSVAGINAL OB US TECHNIQUE: Both transabdominal and transvaginal ultrasound examinations were performed for complete evaluation of the gestation as well as the maternal uterus, adnexal regions, and pelvic cul-de-sac. Transvaginal technique was performed to assess early pregnancy. COMPARISON:  None. FINDINGS: Intrauterine gestational sac: A single intrauterine gestational sac is present. Yolk sac:  Yolk sac is present. Embryo:  Fetal pole is demonstrated. Cardiac Activity: Fetal cardiac activity is observed. Heart Rate: 158  bpm CRL:  13  mm   7 w   4 d                  US EDC: 04/09/2018 Subchorionic hemorrhage: A small subchorionic hemorrhage is demonstrated inferiorly. Maternal uterus/adnexae: Uterus is anteverted. No myometrial mass lesions are identified. Both ovaries are visualized. Right hip there is only seen transabdominally. There is a complex cyst measuring 2.6 cm maximal diameter. This could represent a functional cyst or corpus luteal cyst. The left ovary appears normal. There is a simple appearing paraovarian cyst measuring 3.1 cm maximal diameter. No free fluid in the pelvis. IMPRESSION: Single intrauterine pregnancy. Estimated gestational age by LMP is 7 weeks 4 days. A small subchorionic hemorrhage is demonstrated. Bilateral adnexal cyst, likely benign. Electronically Signed   By: Burman NievesWilliam  Stevens M.D.   On: 08/28/2017 23:02   [place] Ob Transvaginal  Result Date: 08/28/2017 CLINICAL DATA:  Lower abdominal pain for 2 weeks. Positive pregnancy test. Unknown last menstrual period. Quantitative beta HCG is 102,068 EXAM: OBSTETRIC <14 WK US AND TRANSVAGINAL OB US TECHNIQUE: Both transabdominal and transvaginal ultrasound examinations were performed for complete evaluation of the gestation as well as the  maternal uterus, adnexal regions, and pelvic cul-de-sac. Transvaginal technique was performed to assess early pregnancy. COMPARISON:  None. FINDINGS: Intrauterine gestational sac: A single intrauterine gestational sac is present. Yolk sac:  Yolk sac is present. Embryo:  Fetal pole is demonstrated. Cardiac Activity: Fetal cardiac activity is observed. Heart Rate: 158  bpm CRL:  13  mm   7 w   4 d                  US EDC: 04/09/2018 Subchorionic hemorrhage: A small subchorionic hemorrhage is demonstrated inferiorly. Maternal uterus/adnexae: Uterus is anteverted. No myometrial mass lesions are identified. Both ovaries are visualized. Right hip there is only seen transabdominally. There is a complex cyst measuring 2.6 cm maximal diameter. This could represent a functional cyst or corpus luteal cyst. The left ovary appears normal. There is a simple appearing paraovarian cyst measuring 3.1 cm maximal diameter. No free fluid in the pelvis. IMPRESSION: Single intrauterine pregnancy. Estimated gestational age by LMP is 7 weeks 4 days. A small subchorionic hemorrhage is demonstrated. Bilateral adnexal cyst, likely  benign. Electronically Signed   By: Burman Nieves M.D.   On: 08/28/2017 23:02   US Pelvis Transvanginal Non-ob (tv Only)  Result Date: 09/20/2017 CLINICAL DATA:  Initial evaluation for acute pelvic pain with vaginal bleeding. Recent spontaneous abortion on 09/16/2016. EXAM: TRANSABDOMINAL AND TRANSVAGINAL ULTRASOUND OF PELVIS TECHNIQUE: Both transabdominal and transvaginal ultrasound examinations of the pelvis were performed. Transabdominal technique was performed for global imaging of the pelvis including uterus, ovaries, adnexal regions, and pelvic cul-de-sac. It was necessary to proceed with endovaginal exam following the transabdominal exam to visualize the uterus and ovaries. COMPARISON:  Prior ultrasound from 08/28/2017. FINDINGS: Uterus Measurements: 12.9 x 6.7 x 7.6 cm. No fibroids or other mass  visualized. Endometrium Endometrium is markedly thickened measuring up to 34.4 mm with heterogeneous echogenic echotexture. Few scattered foci of internal vascularity are seen. Few scattered superimposed areas of more hypoechoic echotexture likely reflect retained blood products/clot. Right ovary Visualized.  No adnexal mass.  Not Left ovary Measurements: 5.0 x 2.5 x 2.4 cm. 3.0 x 2.4 x 2.4 cm simple cyst noted, most consistent with a normal physiologic cyst. Other findings No abnormal free fluid. IMPRESSION: 1. Thickened endometrium measuring up to 34.4 mm with heterogeneous echogenic appearance and scattered areas of internal vascularity. While a component of these findings likely reflects blood products/clot, overall appearance is concerning for retained products of conception. 2. 3 cm physiologic left ovarian cyst. 3. Nonvisualization of the right ovary.  No adnexal mass. Electronically Signed   By: Rise Mu M.D.   On: 09/20/2017 01:29    MAU Course/MDM: I have ordered labs as follows: See above Imaging ordered: [place] which showed retained products of conception Results reviewed. With Dr Earlene Plater who came to see patient and discuss options.  See her note for details.    Treatments in MAU included analgesia with moderate relief.   Pt stable at time of discharge.  Assessment: Status post elective termination of pregnancy at 10+ weeks Retained products of conception  Plan: Discharge home Recommend return later today for D&E Doxycycline given prior to discharge MD to follow  Encouraged to return here or to other Urgent Care/ED if she develops worsening of symptoms, increase in pain, fever, or other concerning symptoms.   Wynelle Bourgeois CNM, MSN Certified Nurse-Midwife 09/20/2017 12:11 AM

## 2017-09-20 NOTE — Discharge Instructions (Addendum)
   We will discuss your surgery once again in detail at your post-op visit in two to four weeks. If you haven't already done so, please call to make your appointment as soon as possible.  Dilation and Curettage or Vacuum Curettage, Care After These instructions give you information on caring for yourself after your procedure. Your doctor may also give you more specific instructions. Call your doctor if you have any problems or questions after your procedure. HOME CARE Do not drive for 24 hours. Wait 1 week before doing any activities that wear you out. Do not stand for a long time. Limit stair climbing to once or twice a day. Rest often. Continue with your usual diet. Drink enough fluids to keep your pee (urine) clear or pale yellow. If you have a hard time pooping (constipation), you may: Take a medicine to help you go poop (laxative) as told by your doctor. Eat more fruit and bran. Drink more fluids. Take showers, not baths, for as long as told by your doctor. Do not swim or use a hot tub until your doctor says it is okay. Have someone with you for 1day after the procedure. Do not douche, use tampons, or have sex (intercourse) until seen by your doctor Only take medicines as told by your doctor. Do not take aspirin. It can cause bleeding. Keep all doctor visits. GET HELP IF: You have cramps or pain not helped by medicine. You have new pain in the belly (abdomen). You have a bad smelling fluid coming from your vagina. You have a rash. You have problems with any medicine. GET HELP RIGHT AWAY IF:  You start to bleed more than a regular period. You have a fever. You have chest pain. You have trouble breathing. You feel dizzy or feel like passing out (fainting). You pass out. You have pain in the tops of your shoulders. You have vaginal bleeding with or without clumps of blood (blood clots). MAKE SURE YOU: Understand these instructions. Will watch your condition. Will get help  right away if you are not doing well or get worse. Document Released: 05/22/2008 Document Revised: 08/18/2013 Document Reviewed: 03/12/2013 Exit[employer] Patient Information 2015 [employer]. This information is not intended to replace advice given to you by your health care provider. Make sure you discuss any questions you have with your health care provider.   

## 2017-09-20 NOTE — H&P (Deleted)
  The note originally documented on this encounter has been moved the the encounter in which it belongs.  

## 2017-09-20 NOTE — MAU Note (Signed)
Had SAB on 1/21. Still having 9/10 pain. Minimal bleeding, with 2 large clots today.

## 2017-09-20 NOTE — Anesthesia Postprocedure Evaluation (Signed)
Anesthesia Post Note  Patient: Clinical cytogeneticistKazual Minerva  Procedure(s) Performed: SUCTION DILATATION AND EVACUATION (N/A )     Patient location during evaluation: PACU Anesthesia Type: General Level of consciousness: awake and alert Pain management: pain level controlled Vital Signs Assessment: post-procedure vital signs reviewed and stable Respiratory status: spontaneous breathing, nonlabored ventilation, respiratory function stable and patient connected to nasal cannula oxygen Cardiovascular status: blood pressure returned to baseline and stable Postop Assessment: no apparent nausea or vomiting Anesthetic complications: no    Last Vitals:  Vitals:   09/20/17 1115 09/20/17 1130  BP: 119/73   Pulse: 89 80  Resp: 19 (!) 21  Temp: 37.1 C   SpO2: 98% 100%    Last Pain:  Vitals:   09/20/17 0908  TempSrc: Oral  PainSc: 8    Pain Goal: Patients Stated Pain Goal: 4 (09/20/17 1115)               [name]

## 2017-09-20 NOTE — Op Note (Signed)
Operative Note   09/20/2017  PRE-OP DIAGNOSIS: Retained products of conception. History of elective abortion on 1/21.    POST-OP DIAGNOSIS: Same  SURGEON: Surgeon(s) and Role:    * Reiffton BingPickens, Akif Weldy, MD - Primary  ASSISTANT: None  PROCEDURE:  Suction dilation and curettage  ANESTHESIA: General and paracervical block  ESTIMATED BLOOD LOSS: 25mL  DRAINS: per anesthesia note  TOTAL IV FLUIDS: per anesthesia note  SPECIMENS: products of conception to pathology  VTE PROPHYLAXIS: None (irritating to patient's skin)  ANTIBIOTICS: Doxycycline 100mg  IV x 1 pre op  COMPLICATIONS: none  DISPOSITION: PACU - hemodynamically stable.  CONDITION: stable  BLOOD TYPE: A POS. Rhogam given:not applicable  FINDINGS: Exam under anesthesia revealed 10 week sized uterus with no masses and bilateral adnexa without masses or fullness. Large amount of products of conception were seen, with gritty texture in all four quadrants.  PROCEDURE IN DETAIL:  After informed consent was obtained, the patient was taken to the operating room where anesthesia was obtained without difficulty. The patient was positioned in the dorsal lithotomy position in [place] stirrups. The patient was examined under anesthesia, with the above noted findings.  The bi-valved speculum was placed inside the patient's vagina, and the the anterior lip of the cervix was seen and grasped with the tenaculum.  A paracervical block was achieved with 20mL of 1% lidocaine and then the cervix easily passed a #10 suction cannula  The suction was then calibrated to 60mmHg and connected to the number 10 cannula, which was then introduced with the above noted findings. A gentle curettage was done at the end and yield no products of conception.   The suction was then done one more time to remove any remaining curettage material.   Excellent hemostasis was noted, and all instruments were removed, with excellent hemostasis noted throughout.  She was then  taken out of dorsal lithotomy. The patient tolerated the procedure well.  Sponge, lap and instrument counts were correct x2.  The patient was taken to recovery room in excellent condition.  Cornelia Copaharlie Tramane Gorum, Jr MD Attending [facility](Faculty Practice)

## 2017-09-20 NOTE — Transfer of Care (Signed)
Immediate Anesthesia Transfer of Care Note  Patient: Traci Castro  Procedure(s) Performed: SUCTION DILATATION AND EVACUATION (N/A )  Patient Location: PACU  Anesthesia Type:General  Level of Consciousness: awake, alert  and oriented  Airway & Oxygen Therapy: Patient Spontanous Breathing and Patient connected to nasal cannula oxygen  Post-op Assessment: Report given to RN and Post -op Vital signs reviewed and stable  Post vital signs: Reviewed and stable  Last Vitals:  Vitals:   09/20/17 0908  BP: 138/76  Pulse: 85  Resp: 18  Temp: 36.8 C  SpO2: 100%    Last Pain:  Vitals:   09/20/17 0908  TempSrc: Oral  PainSc: 8       Patients Stated Pain Goal: 4 (09/20/17 0908)  Complications: No apparent anesthesia complications

## 2017-09-20 NOTE — Discharge Instructions (Signed)
Oral Contraception Use Oral contraceptive pills (OCPs) are medicines taken to prevent pregnancy. OCPs work by preventing the ovaries from releasing eggs. The hormones in OCPs also cause the cervical mucus to thicken, preventing the sperm from entering the uterus. The hormones also cause the uterine lining to become thin, not allowing a fertilized egg to attach to the inside of the uterus. OCPs are highly effective when taken exactly as prescribed. However, OCPs do not prevent sexually transmitted diseases (STDs). Safe sex practices, such as using condoms along with an OCP, can help prevent STDs. Before taking OCPs, you may have a physical exam and Pap test. Your health care provider may also order blood tests if necessary. Your health care provider will make sure you are a good candidate for oral contraception. Discuss with your health care provider the possible side effects of the OCP you may be prescribed. When starting an OCP, it can take 2 to 3 months for the body to adjust to the changes in hormone levels in your body. How to take oral contraceptive pills Your health care provider may advise you on how to start taking the first cycle of OCPs. Otherwise, you can:  Start on day 1 of your menstrual period. You will not need any backup contraceptive protection with this start time.  Start on the first Sunday after your menstrual period or the day you get your prescription. In these cases, you will need to use backup contraceptive protection for the first week.  Start the pill at any time of your cycle. If you take the pill within 5 days of the start of your period, you are protected against pregnancy right away. In this case, you will not need a backup form of birth control. If you start at any other time of your menstrual cycle, you will need to use another form of birth control for 7 days. If your OCP is the type called a minipill, it will protect you from pregnancy after taking it for 2 days (48  hours).  After you have started taking OCPs:  If you forget to take 1 pill, take it as soon as you remember. Take the next pill at the regular time.  If you miss 2 or more pills, call your health care provider because different pills have different instructions for missed doses. Use backup birth control until your next menstrual period starts.  If you use a 28-day pack that contains inactive pills and you miss 1 of the last 7 pills (pills with no hormones), it will not matter. Throw away the rest of the non-hormone pills and start a new pill pack.  No matter which day you start the OCP, you will always start a new pack on that same day of the week. Have an extra pack of OCPs and a backup contraceptive method available in case you miss some pills or lose your OCP pack. Follow these instructions at home:  Do not smoke.  Always use a condom to protect against STDs. OCPs do not protect against STDs.  Use a calendar to mark your menstrual period days.  Read the information and directions that came with your OCP. Talk to your health care provider if you have questions. Contact a health care provider if:  You develop nausea and vomiting.  You have abnormal vaginal discharge or bleeding.  You develop a rash.  You miss your menstrual period.  You are losing your hair.  You need treatment for mood swings or depression.  You  get dizzy when taking the OCP.  You develop acne from taking the OCP.  You become pregnant. Get help right away if:  You develop chest pain.  You develop shortness of breath.  You have an uncontrolled or severe headache.  You develop numbness or slurred speech.  You develop visual problems.  You develop pain, redness, and swelling in the legs. This information is not intended to replace advice given to you by your health care provider. Make sure you discuss any questions you have with your health care provider. Document Released: 08/02/2011 Document  Revised: 01/19/2016 Document Reviewed: 02/01/2013 Elsevier Interactive Patient Education  2017 [employer] Ethinyl Estradiol; Etonogestrel vaginal ring What is this medicine? ETHINYL ESTRADIOL; ETONOGESTREL (ETH in il es tra DYE ole; et oh noe JES trel) vaginal ring is a flexible, vaginal ring used as a contraceptive (birth control method). This medicine combines two types of female hormones, an estrogen and a progestin. This ring is used to prevent ovulation and pregnancy. Each ring is effective for one month. This medicine may be used for other purposes; ask your health care provider or pharmacist if you have questions. COMMON BRAND NAME(S): NuvaRing What should I tell my health care provider before I take this medicine? They need to know if you have or ever had any of these conditions: -abnormal vaginal bleeding -blood vessel disease or blood clots -breast, cervical, endometrial, ovarian, liver, or uterine cancer -diabetes -gallbladder disease -heart disease or recent heart attack -high blood pressure -high cholesterol -kidney disease -liver disease -migraine headaches -stroke -systemic lupus erythematosus (SLE) -tobacco smoker -an unusual or allergic reaction to estrogens, progestins, other medicines, foods, dyes, or preservatives -pregnant or trying to get pregnant -breast-feeding How should I use this medicine? Insert the ring into your vagina as directed. Follow the directions on the prescription label. The ring will remain place for 3 weeks and is then removed for a 1-week break. A new ring is inserted 1 week after the last ring was removed, on the same day of the week. Check often to make sure the ring is still in place, especially before and after sexual intercourse. If the ring was out of the vagina for an unknown amount of time, you may not be protected from pregnancy. Perform a pregnancy test and call your doctor. Do not use more often than directed. A patient package  insert for the product will be given with each prescription and refill. Read this sheet carefully each time. The sheet may change frequently. Contact your pediatrician regarding the use of this medicine in children. Special care may be needed. This medicine has been used in female children who have started having menstrual periods. Overdosage: If you think you have taken too much of this medicine contact a poison control center or emergency room at once. NOTE: This medicine is only for you. Do not share this medicine with others. What if I miss a dose? You will need to replace your vaginal ring once a month as directed. If the ring should slip out, or if you leave it in longer or shorter than you should, contact your health care professional for advice. What may interact with this medicine? Do not take this medicine with the following medication: -dasabuvir; ombitasvir; paritaprevir; ritonavir -ombitasvir; paritaprevir; ritonavir This medicine may also interact with the following medications: -acetaminophen -antibiotics or medicines for infections, especially rifampin, rifabutin, rifapentine, and griseofulvin, and possibly penicillins or tetracyclines -aprepitant -ascorbic acid (vitamin C) -atorvastatin -barbiturate medicines, such as phenobarbital -bosentan -  carbamazepine -caffeine -clofibrate -cyclosporine -dantrolene -doxercalciferol -felbamate -grapefruit juice -hydrocortisone -medicines for anxiety or sleeping problems, such as diazepam or temazepam -medicines for diabetes, including pioglitazone -modafinil -mycophenolate -nefazodone -oxcarbazepine -phenytoin -prednisolone -ritonavir or other medicines for HIV infection or AIDS -rosuvastatin -selegiline -soy isoflavones supplements -St. John's wort -tamoxifen or raloxifene -theophylline -thyroid hormones -topiramate -warfarin This list may not describe all possible interactions. Give your health care provider a list  of all the medicines, herbs, non-prescription drugs, or dietary supplements you use. Also tell them if you smoke, drink alcohol, or use illegal drugs. Some items may interact with your medicine. What should I watch for while using this medicine? Visit your doctor or health care professional for regular checks on your progress. You will need a regular breast and pelvic exam and Pap smear while on this medicine. Use an additional method of contraception during the first cycle that you use this ring. Do not use a diaphragm or female condom, as the ring can interfere with these birth control methods and their proper placement. If you have any reason to think you are pregnant, stop using this medicine right away and contact your doctor or health care professional. If you are using this medicine for hormone related problems, it may take several cycles of use to see improvement in your condition. Smoking increases the risk of getting a blood clot or having a stroke while you are using hormonal birth control, especially if you are more than 22 years old. You are strongly advised not to smoke. This medicine can make your body retain fluid, making your fingers, hands, or ankles swell. Your blood pressure can go up. Contact your doctor or health care professional if you feel you are retaining fluid. This medicine can make you more sensitive to the sun. Keep out of the sun. If you cannot avoid being in the sun, wear protective clothing and use sunscreen. Do not use sun lamps or tanning beds/booths. If you wear contact lenses and notice visual changes, or if the lenses begin to feel uncomfortable, consult your eye care specialist. In some women, tenderness, swelling, or minor bleeding of the gums may occur. Notify your dentist if this happens. Brushing and flossing your teeth regularly may help limit this. See your dentist regularly and inform your dentist of the medicines you are taking. If you are going to have  elective surgery, you may need to stop using this medicine before the surgery. Consult your health care professional for advice. This medicine does not protect you against HIV infection (AIDS) or any other sexually transmitted diseases. What side effects may I notice from receiving this medicine? Side effects that you should report to your doctor or health care professional as soon as possible: -breast tissue changes or discharge -changes in vaginal bleeding during your period or between your periods -chest pain -coughing up blood -dizziness or fainting spells -headaches or migraines -leg, arm or groin pain -severe or sudden headaches -stomach pain (severe) -sudden shortness of breath -sudden loss of coordination, especially on one side of the body -speech problems -symptoms of vaginal infection like itching, irritation or unusual discharge -tenderness in the upper abdomen -vomiting -weakness or numbness in the arms or legs, especially on one side of the body -yellowing of the eyes or skin Side effects that usually do not require medical attention (report to your doctor or health care professional if they continue or are bothersome): -breakthrough bleeding and spotting that continues beyond the 3 initial cycles of pills -breast  tenderness -mood changes, anxiety, depression, frustration, anger, or emotional outbursts -increased sensitivity to sun or ultraviolet light -nausea -skin rash, acne, or brown spots on the skin -weight gain (slight) This list may not describe all possible side effects. Call your doctor for medical advice about side effects. You may report side effects to FDA at 1-800-FDA-1088. Where should I keep my medicine? Keep out of the reach of children. Store at room temperature between 15 and 30 degrees C (59 and 86 degrees F) for up to 4 months. The product will expire after 4 months. Protect from light. Throw away any unused medicine after the expiration date. NOTE:  This sheet is a summary. It may not cover all possible information. If you have questions about this medicine, talk to your doctor, pharmacist, or health care provider.  2018 Elsevier/Gold Standard (2016-04-20 17:00:31) Etonogestrel implant What is this medicine? ETONOGESTREL (et oh noe JES trel) is a contraceptive (birth control) device. It is used to prevent pregnancy. It can be used for up to 3 years. This medicine may be used for other purposes; ask your health care provider or pharmacist if you have questions. COMMON BRAND NAME(S): Implanon, Nexplanon What should I tell my health care provider before I take this medicine? They need to know if you have any of these conditions: -abnormal vaginal bleeding -blood vessel disease or blood clots -cancer of the breast, cervix, or liver -depression -diabetes -gallbladder disease -headaches -heart disease or recent heart attack -high blood pressure -high cholesterol -kidney disease -liver disease -renal disease -seizures -tobacco smoker -an unusual or allergic reaction to etonogestrel, other hormones, anesthetics or antiseptics, medicines, foods, dyes, or preservatives -pregnant or trying to get pregnant -breast-feeding How should I use this medicine? This device is inserted just under the skin on the inner side of your upper arm by a health care professional. Talk to your pediatrician regarding the use of this medicine in children. Special care may be needed. Overdosage: If you think you have taken too much of this medicine contact a poison control center or emergency room at once. NOTE: This medicine is only for you. Do not share this medicine with others. What if I miss a dose? This does not apply. What may interact with this medicine? Do not take this medicine with any of the following medications: -amprenavir -bosentan -fosamprenavir This medicine may also interact with the following medications: -barbiturate medicines for  inducing sleep or treating seizures -certain medicines for fungal infections like ketoconazole and itraconazole -grapefruit juice -griseofulvin -medicines to treat seizures like carbamazepine, felbamate, oxcarbazepine, phenytoin, topiramate -modafinil -phenylbutazone -rifampin -rufinamide -some medicines to treat HIV infection like atazanavir, indinavir, lopinavir, nelfinavir, tipranavir, ritonavir -St. John's wort This list may not describe all possible interactions. Give your health care provider a list of all the medicines, herbs, non-prescription drugs, or dietary supplements you use. Also tell them if you smoke, drink alcohol, or use illegal drugs. Some items may interact with your medicine. What should I watch for while using this medicine? This product does not protect you against HIV infection (AIDS) or other sexually transmitted diseases. You should be able to feel the implant by pressing your fingertips over the skin where it was inserted. Contact your doctor if you cannot feel the implant, and use a non-hormonal birth control method (such as condoms) until your doctor confirms that the implant is in place. If you feel that the implant may have broken or become bent while in your arm, contact your healthcare provider. What  side effects may I notice from receiving this medicine? Side effects that you should report to your doctor or health care professional as soon as possible: -allergic reactions like skin rash, itching or hives, swelling of the face, lips, or tongue -breast lumps -changes in emotions or moods -depressed mood -heavy or prolonged menstrual bleeding -pain, irritation, swelling, or bruising at the insertion site -scar at site of insertion -signs of infection at the insertion site such as fever, and skin redness, pain or discharge -signs of pregnancy -signs and symptoms of a blood clot such as breathing problems; changes in vision; chest pain; severe, sudden headache;  pain, swelling, warmth in the leg; trouble speaking; sudden numbness or weakness of the face, arm or leg -signs and symptoms of liver injury like dark yellow or brown urine; general ill feeling or flu-like symptoms; light-colored stools; loss of appetite; nausea; right upper belly pain; unusually weak or tired; yellowing of the eyes or skin -unusual vaginal bleeding, discharge -signs and symptoms of a stroke like changes in vision; confusion; trouble speaking or understanding; severe headaches; sudden numbness or weakness of the face, arm or leg; trouble walking; dizziness; loss of balance or coordination Side effects that usually do not require medical attention (report to your doctor or health care professional if they continue or are bothersome): -acne -back pain -breast pain -changes in weight -dizziness -general ill feeling or flu-like symptoms -headache -irregular menstrual bleeding -nausea -sore throat -vaginal irritation or inflammation This list may not describe all possible side effects. Call your doctor for medical advice about side effects. You may report side effects to FDA at 1-800-FDA-1088. Where should I keep my medicine? This drug is given in a hospital or clinic and will not be stored at home. NOTE: This sheet is a summary. It may not cover all possible information. If you have questions about this medicine, talk to your doctor, pharmacist, or health care provider.  2018 Elsevier/Gold Standard (2016-03-01 11:19:22) Intrauterine Device Information An intrauterine device (IUD) is inserted into your uterus to prevent pregnancy. There are two types of IUDs available:  Copper IUD--This type of IUD is wrapped in copper wire and is placed inside the uterus. Copper makes the uterus and fallopian tubes produce a fluid that kills sperm. The copper IUD can stay in place for 10 years.  Hormone IUD--This type of IUD contains the hormone progestin (synthetic progesterone). The hormone  thickens the cervical mucus and prevents sperm from entering the uterus. It also thins the uterine lining to prevent implantation of a fertilized egg. The hormone can weaken or kill the sperm that get into the uterus. One type of hormone IUD can stay in place for 5 years, and another type can stay in place for 3 years.  Your health care provider will make sure you are a good candidate for a contraceptive IUD. Discuss with your health care provider the possible side effects. Advantages of an intrauterine device  IUDs are highly effective, reversible, long acting, and low maintenance.  There are no estrogen-related side effects.  An IUD can be used when breastfeeding.  IUDs are not associated with weight gain.  The copper IUD works immediately after insertion.  The hormone IUD works right away if inserted within 7 days of your period starting. You will need to use a backup method of birth control for 7 days if the hormone IUD is inserted at any other time in your cycle.  The copper IUD does not interfere with your female hormones.  The hormone IUD can make heavy menstrual periods lighter and decrease cramping.  The hormone IUD can be used for 3 or 5 years.  The copper IUD can be used for 10 years. Disadvantages of an intrauterine device  The hormone IUD can be associated with irregular bleeding patterns.  The copper IUD can make your menstrual flow heavier and more painful.  You may experience cramping and vaginal bleeding after insertion. This information is not intended to replace advice given to you by your health care provider. Make sure you discuss any questions you have with your health care provider. Document Released: 07/17/2004 Document Revised: 01/19/2016 Document Reviewed: 02/01/2013 Elsevier Interactive Patient Education  2017 [employer].

## 2017-09-20 NOTE — Anesthesia Preprocedure Evaluation (Signed)
Anesthesia Evaluation  Patient identified by MRN, date of birth, ID band Patient awake    Reviewed: Allergy & Precautions, H&P , NPO status , Patient's Chart, lab work & pertinent test results  Airway Mallampati: II  TM Distance: >3 FB Neck ROM: full    Dental no notable dental hx.    Pulmonary neg pulmonary ROS,    Pulmonary exam normal breath sounds clear to auscultation       Cardiovascular negative cardio ROS Normal cardiovascular exam Rhythm:Regular Rate:Normal     Neuro/Psych negative neurological ROS  negative psych ROS   GI/Hepatic negative GI ROS, Neg liver ROS,   Endo/Other  negative endocrine ROSMorbid obesity  Renal/GU negative Renal ROS  negative genitourinary   Musculoskeletal negative musculoskeletal ROS (+)   Abdominal   Peds negative pediatric ROS (+)  Hematology negative hematology ROS (+)   Anesthesia Other Findings   Reproductive/Obstetrics negative OB ROS                             Anesthesia Physical  Anesthesia Plan  ASA: II  Anesthesia Plan: General   Post-op Pain Management:    Induction: Intravenous  PONV Risk Score and Plan: 2 and Ondansetron, Dexamethasone and Treatment may vary due to age or medical condition  Airway Management Planned: LMA  Additional Equipment:   Intra-op Plan:   Post-operative Plan: Extubation in OR  Informed Consent: I have reviewed the patients History and Physical, chart, labs and discussed the procedure including the risks, benefits and alternatives for the proposed anesthesia with the patient or authorized representative who has indicated his/her understanding and acceptance.     Plan Discussed with: CRNA, Surgeon and Anesthesiologist  Anesthesia Plan Comments: ( )        Anesthesia Quick Evaluation

## 2017-09-20 NOTE — H&P (Signed)
OB/GYN History and Physical  Traci Castro is a 22 y.o. G2P1011 presenting for uterine cramping. She underwent a surgical abortion at a clinic earlier this week at approx 10 weeks. States since procedure, she has had ongoing cramping. Has been taking motrin every 8 hours for pain with no improvement. No improvement and no significant worsening, but they came in tonight because they have not gotten better despite it being four days later. States she has light bleeding. Denies fever, chills, nausea, vomiting. She has not had a return of her appetite. Is voiding normally and having BM.        Past Medical History:  Diagnosis Date  . Anxiety   . Depression   . Gastritis    2 bouts  . Migraine   . Psoriasis   . Vision abnormalities    wears glasses    Past Surgical History:  Procedure Laterality Date  . ESOPHAGOGASTRODUODENOSCOPY N/A 10/17/2012   Procedure: ESOPHAGOGASTRODUODENOSCOPY (EGD);  Surgeon: Jon GillsJoseph H Clark, MD;  [facility];  Service: Gastroenterology;  Laterality: N/A;    OB History  Gravida Para Term Preterm AB Living  2 1 1   1 1   SAB TAB Ectopic Multiple Live Births    1   0 1    # Outcome Date GA Lbr Len/2nd Weight Sex Delivery Anes PTL Lv  2 TAB 2019 [id number]         1 Term 05/05/16 [id number] 03:05 / 00:50 7 lb 6.2 oz (3.351 kg) M Vag-Spont Local  LIV      Social History   Socioeconomic History  . Marital status: Single    Spouse name: None  . Number of children: None  . Years of education: None  . Highest education level: None  Social Needs  . Financial resource strain: None  . Food insecurity - worry: None  . Food insecurity - inability: None  . Transportation needs - medical: None  . Transportation needs - non-medical: None  Occupational History  . None  Tobacco Use  . Smoking status: Never Smoker  . Smokeless tobacco: Never Used  Substance and Sexual Activity  . Alcohol use: No  . Drug use: No  . Sexual activity: Yes  Other Topics Concern  .  None  Social History Narrative   10th grade    Family History  Problem Relation Age of Onset  . Cholelithiasis Mother   . Hyperlipidemia Mother   . Hypertension Mother   . Mental illness Mother   . Depression Mother   . Cholelithiasis Maternal Uncle   . Hyperlipidemia Maternal Uncle   . Hypertension Maternal Uncle   . Cholelithiasis Maternal Grandmother   . Arthritis Maternal Grandmother   . COPD Maternal Grandmother   . Depression Maternal Grandmother   . Diabetes Maternal Grandmother   . Heart disease Maternal Grandmother   . Hyperlipidemia Maternal Grandmother   . Hypertension Maternal Grandmother   . Learning disabilities Paternal Grandmother   . Mental illness Paternal Grandmother   . Hyperlipidemia Paternal Grandfather   . Diabetes Paternal Grandfather   . Ulcers Neg Hx   . Alcohol abuse Neg Hx   . Asthma Neg Hx   . Birth defects Neg Hx   . Cancer Neg Hx   . Drug abuse Neg Hx   . Early death Neg Hx   . Hearing loss Neg Hx   . Kidney disease Neg Hx   . Mental retardation Neg Hx   . Miscarriages / Stillbirths Neg Hx   .  Stroke Neg Hx   . Vision loss Neg Hx     Medications Prior to Admission  Medication Sig Dispense Refill Last Dose  . ibuprofen (ADVIL,MOTRIN) 800 MG tablet Take 1 tablet (800 mg total) by mouth every 6 (six) hours as needed. 30 tablet 0 09/19/2017 at Unknown time  . azithromycin (ZITHROMAX) 250 MG tablet Take 1 tablet (250 mg total) by mouth daily. Take first 2 tablets together, then 1 every day until finished. 6 tablet 0 Unknown at Unknown time  . benzonatate (TESSALON) 100 MG capsule Take 1 capsule (100 mg total) by mouth every 8 (eight) hours. 21 capsule 0 Unknown at Unknown time  . cetirizine (ZYRTEC ALLERGY) 10 MG tablet Take 1 tablet (10 mg total) by mouth daily. 30 tablet 1 Unknown at Unknown time  . cyclobenzaprine (FLEXERIL) 5 MG tablet Take 1 tablet (5 mg total) by mouth 3 (three) times daily as needed for muscle spasms. (Patient not  taking: Reported on 08/01/2016) 30 tablet 0 Unknown at Unknown time  . erythromycin ophthalmic ointment Place 1 application into the right eye every 6 (six) hours. Place 1/2 inch ribbon of ointment in the affected eye 4 times a day 1 g 1 Unknown at Unknown time  . fluticasone (FLONASE) 50 MCG/ACT nasal spray Place 2 sprays into both nostrils daily. 16 g 0 Unknown at Unknown time  . montelukast (SINGULAIR) 10 MG tablet Take 1 tablet (10 mg total) by mouth at bedtime. (Patient not taking: Reported on 08/01/2016) 30 tablet 5 Unknown at Unknown time  . naproxen (NAPROSYN) 250 MG tablet Take 1 tablet (250 mg total) by mouth 2 (two) times daily with a meal. 30 tablet 0 Unknown at Unknown time  . norgestimate-ethinyl estradiol (ORTHO-CYCLEN,SPRINTEC,PREVIFEM) 0.25-35 MG-MCG tablet Take 1 tablet by mouth daily. 1 Package 11 Unknown at Unknown time  . ondansetron (ZOFRAN ODT) 4 MG disintegrating tablet Take 1 tablet (4 mg total) by mouth every 8 (eight) hours as needed for nausea or vomiting. 10 tablet 0 Unknown at Unknown time  . ondansetron (ZOFRAN) 4 MG tablet Take 1 tablet (4 mg total) by mouth every 8 (eight) hours as needed for nausea or vomiting. 4 tablet 0 Unknown at Unknown time  . Prenatal Vit-Fe Fumarate-FA (PRENATAL MULTIVITAMIN) TABS tablet Take 1 tablet by mouth daily at 12 noon.   Unknown at Unknown time    Allergies  Allergen Reactions  . Tramadol Nausea And Vomiting    Review of Systems: Negative except for what is mentioned in HPI.     Physical Exam: BP (!) 145/79 (BP Location: Right Arm)   Pulse 80   Temp 98.6 F (37 C) (Oral)   Resp 20   Ht 5\' 4"  (1.626 m)   Wt (!) 308 lb (139.7 kg)   SpO2 100%   BMI 52.87 kg/m  CONSTITUTIONAL: Well-developed, well-nourished female in no acute distress. Morbidly obese HENT:  Normocephalic, atraumatic, External right and left ear normal. Oropharynx is clear and moist EYES: Conjunctivae and EOM are normal. Pupils are equal, round, and reactive  to light. No scleral icterus.  NECK: Normal range of motion, supple, no masses SKIN: Skin is warm and dry. Not diaphoretic. No erythema. No pallor. Multiple circular excoriations in various stages of healing across all four limbs, patient states due to psoriasis NEUROLGIC: Alert and oriented to person, place, and time. Normal reflexes, muscle tone coordination. No cranial nerve deficit noted. PSYCHIATRIC: Normal mood and affect. Normal behavior. Normal judgment and thought content. CARDIOVASCULAR: Normal heart  rate noted, regular rhythm RESPIRATORY: Effort normal, no problems with respiration noted ABDOMEN: Soft, non-distended, very mildly tender over uterus PELVIC: Deferred MUSCULOSKELETAL: Normal range of motion. No edema and no tenderness. 2+ distal pulses.   Pertinent Labs/Studies:   CLINICAL DATA:  Initial evaluation for acute pelvic pain with vaginal bleeding. Recent spontaneous abortion on 09/16/2016.  EXAM: TRANSABDOMINAL AND TRANSVAGINAL ULTRASOUND OF PELVIS  TECHNIQUE: Both transabdominal and transvaginal ultrasound examinations of the pelvis were performed. Transabdominal technique was performed for global imaging of the pelvis including uterus, ovaries, adnexal regions, and pelvic cul-de-sac. It was necessary to proceed with endovaginal exam following the transabdominal exam to visualize the uterus and ovaries.  COMPARISON:  Prior ultrasound from 08/28/2017.  FINDINGS: Uterus  Measurements: 12.9 x 6.7 x 7.6 cm. No fibroids or other mass visualized.  Endometrium  Endometrium is markedly thickened measuring up to 34.4 mm with heterogeneous echogenic echotexture. Few scattered foci of internal vascularity are seen. Few scattered superimposed areas of more hypoechoic echotexture likely reflect retained blood products/clot.  Right ovary  Visualized.  No adnexal mass.  Not  Left ovary  Measurements: 5.0 x 2.5 x 2.4 cm. 3.0 x 2.4 x 2.4 cm simple  cyst noted, most consistent with a normal physiologic cyst.  Other findings  No abnormal free fluid.  IMPRESSION: 1. Thickened endometrium measuring up to 34.4 mm with heterogeneous echogenic appearance and scattered areas of internal vascularity. While a component of these findings likely reflects blood products/clot, overall appearance is concerning for retained products of conception. 2. 3 cm physiologic left ovarian cyst. 3. Nonvisualization of the right ovary.  No adnexal mass.   Electronically Signed   By: Rise Mu M.D.   On: 09/20/2017 01:29     Assessment and Plan Nesa Distel is a 22 y.o. G2P1011 here for uterine cramping after surgical termination of pregnancy. Suspected to have retained products of conception based on ultrasound. Patient very mildly tender on exam but otherwise benign findings, no white count or s/s infection, not concerned for septic abortion currently.   Reviewed options for management of retained products including misoprostol and repeat suction D&C. Reviewed risks/benefits of each including high completion rate with misoprostol, avoidance of surgery, avoidance of repeat uterine instrumentation, risks of hemorrhage, worsening cramping, possible need for repeat D&C in event of misoprostol failure, failure of section suction D&C to remove all products, risks of surgery including stroke, clot, uterine perforation, issues with future pregnancies including abnormal placentation, damage to surrounding tissues and organs. Reviewed that if there is concern for sepsis, she will need surgical procedure. Answered all questions for patient and mother.   Patient would prefer to proceed with suction D&C. She will be added on to the OR schedule for later today. She is to remain NPO starting now and OR will call them in am with a time to arrive. Reviewed importance of follow up and remaining NPO.   Discussed options for contraception with patient and  mother, gave info and they will consider. Reviewed reasons to return to MAU sooner than otherwise expected.    Traci Castro, M.D. Attending Obstetrician & Gynecologist, [facility] for [employer], [facility]

## 2017-09-21 ENCOUNTER — Encounter (HOSPITAL_COMMUNITY): Payer: Self-pay | Admitting: Obstetrics and Gynecology

## 2017-09-23 LAB — GC/CHLAMYDIA PROBE AMP (~~LOC~~) NOT AT ARMC
Chlamydia: POSITIVE — AB
NEISSERIA GONORRHEA: NEGATIVE

## 2017-09-24 ENCOUNTER — Telehealth: Payer: Self-pay | Admitting: Student

## 2017-09-24 DIAGNOSIS — A749 Chlamydial infection, unspecified: Secondary | ICD-10-CM

## 2017-09-24 MED ORDER — AZITHROMYCIN 500 MG PO TABS: 1000.0000 mg | ORAL_TABLET | Freq: Once | ORAL | 0 refills | Status: AC

## 2017-09-24 NOTE — Telephone Encounter (Addendum)
Traci Castro tested positive for  Chlamydia. Patient was called by RN and allergies and pharmacy confirmed. Rx sent to pharmacy of choice.   Judeth HornLawrence, Teckla Christiansen, NP 09/24/2017 9:50 AM       ----- Message from Kathe BectonLori S Berdik, RN sent at 09/24/2017  9:29 AM EST ----- This patient tested positive for:  Chlamydia  She :"has allergies to "Chlorhexidine and Tramadol:, I have informed the patient of her results and confirmed her pharmacy is correct in her chart. Please send Rx.   Thank you,   Kathe BectonBerdik, Lori S, RN   [facility]

## 2017-09-25 ENCOUNTER — Other Ambulatory Visit: Payer: Self-pay | Admitting: Advanced Practice Midwife

## 2017-09-25 DIAGNOSIS — A749 Chlamydial infection, unspecified: Secondary | ICD-10-CM | POA: Insufficient documentation

## 2017-09-25 MED ORDER — AZITHROMYCIN 500 MG PO TABS: 1000.0000 mg | ORAL_TABLET | Freq: Once | ORAL | 1 refills | Status: AC

## 2017-09-25 NOTE — Progress Notes (Signed)
Order sent for Azitthromycin for treatment of Chlamydia. RN has informed patient

## 2017-09-26 ENCOUNTER — Encounter (HOSPITAL_COMMUNITY): Payer: Self-pay | Admitting: *Deleted

## 2017-09-26 ENCOUNTER — Inpatient Hospital Stay (HOSPITAL_COMMUNITY)
Admission: AD | Admit: 2017-09-26 | Discharge: 2017-09-26 | Disposition: A | Discharge status: Home / Self Care | Payer: 59 | Source: Ambulatory Visit | Attending: Obstetrics and Gynecology | Admitting: Obstetrics and Gynecology

## 2017-09-26 DIAGNOSIS — Z9889 Other specified postprocedural states: Secondary | ICD-10-CM | POA: Diagnosis not present

## 2017-09-26 DIAGNOSIS — R1084 Generalized abdominal pain: Secondary | ICD-10-CM | POA: Insufficient documentation

## 2017-09-26 DIAGNOSIS — F319 Bipolar disorder, unspecified: Secondary | ICD-10-CM | POA: Insufficient documentation

## 2017-09-26 DIAGNOSIS — Z79899 Other long term (current) drug therapy: Secondary | ICD-10-CM | POA: Diagnosis not present

## 2017-09-26 DIAGNOSIS — Z888 Allergy status to other drugs, medicaments and biological substances status: Secondary | ICD-10-CM | POA: Diagnosis not present

## 2017-09-26 DIAGNOSIS — R1010 Upper abdominal pain, unspecified: Secondary | ICD-10-CM | POA: Insufficient documentation

## 2017-09-26 LAB — CBC
HEMATOCRIT: 34.5 % — AB (ref 36.0–46.0)
HEMOGLOBIN: 10.7 g/dL — AB (ref 12.0–15.0)
MCH: 23.6 pg — ABNORMAL LOW (ref 26.0–34.0)
MCHC: 31 g/dL (ref 30.0–36.0)
MCV: 76.2 fL — ABNORMAL LOW (ref 78.0–100.0)
Platelets: 357 10*3/uL (ref 150–400)
RBC: 4.53 MIL/uL (ref 3.87–5.11)
RDW: 16.7 % — AB (ref 11.5–15.5)
WBC: 12.2 10*3/uL — AB (ref 4.0–10.5)

## 2017-09-26 MED ORDER — KETOROLAC TROMETHAMINE 30 MG/ML IJ SOLN
60.0000 mg | Freq: Once | INTRAMUSCULAR | Status: AC
Start: 2017-09-26 — End: 2017-09-26
  Administered 2017-09-26: 60 mg via INTRAMUSCULAR
  Filled 2017-09-26: qty 2

## 2017-09-26 NOTE — Discharge Instructions (Signed)

## 2017-09-26 NOTE — MAU Note (Signed)
Pt had TAB on 1/21, then had D&C over the weekend for Retained POC. Went back to work today and took ibuprofen. Started having severe abd pain and spasm.

## 2017-09-26 NOTE — MAU Provider Note (Signed)
History  CSN: [id number] Arrival date and time: 09/26/17 1457  Chief Complaint  Patient presents with  . Abdominal Pain   HPI: Traci Castro is a 22 yo G2P1011 woman who presents to the MAU today with complaint of abdominal pain.  The patient had a surgical TAB on 1/21 and underwent D&C on 1/25 for retained products.  Today she went back to work for the first time since her surgery.  She had cramps this morning and took ibuprofen before work.  She was feeling well during the morning, but after her lunch break she began having diffuse sharp abdominal pain.  She reports that the pain was worse in her upper abdomen but radiated from her bra line to her waist line.  She had associated nausea and lightheadedness during this episode.  She called her grandmother who brought her to the MAU.  The patient has been having vaginal bleeding since her D&C 6 days ago; she changes her pad every 2-3 hours and says pads are usually 50% used.  She reports having normal BM, last BM was this morning; however, she does endorse a history of GI illness for which she needed an upper GI scope but she does not remember for what purpose or what the result was.  She was diagnosed with Chlamydia on 1/29 for which she completed treatment.   OB History    Gravida Para Term Preterm AB Living   2 1 1   1 1    SAB TAB Ectopic Multiple Live Births     1   0 1      Past Medical History:  Diagnosis Date  . Anemia   . Anxiety    no meds  . Bipolar disorder (HCC)   . Depression    no meds  . Gastritis    2 bouts  . H/O seasonal allergies   . Migraine   . Psoriasis   . SVD (spontaneous vaginal delivery)    x 1  . Vision abnormalities    wears glasses    Past Surgical History:  Procedure Laterality Date  . DILATION AND EVACUATION N/A 09/20/2017   Procedure: SUCTION DILATATION AND EVACUATION;  Surgeon: [place] Bing, MD;  Location: [facility] ORS;  Service: Gynecology;  Laterality: N/A;  . ESOPHAGOGASTRODUODENOSCOPY N/A  10/17/2012   Procedure: ESOPHAGOGASTRODUODENOSCOPY (EGD);  Surgeon: Jon Gills, MD;  Location: [facility] OR;  Service: Gastroenterology;  Laterality: N/A;  . THERAPEUTIC ABORTION  09/16/2017   Clinic  . WISDOM TOOTH EXTRACTION      Family History  Problem Relation Age of Onset  . Cholelithiasis Mother   . Hyperlipidemia Mother   . Hypertension Mother   . Mental illness Mother   . Depression Mother   . Cholelithiasis Maternal Uncle   . Hyperlipidemia Maternal Uncle   . Hypertension Maternal Uncle   . Cholelithiasis Maternal Grandmother   . Arthritis Maternal Grandmother   . COPD Maternal Grandmother   . Depression Maternal Grandmother   . Diabetes Maternal Grandmother   . Heart disease Maternal Grandmother   . Hyperlipidemia Maternal Grandmother   . Hypertension Maternal Grandmother   . Learning disabilities Paternal Grandmother   . Mental illness Paternal Grandmother   . Hyperlipidemia Paternal Grandfather   . Diabetes Paternal Grandfather   . Ulcers Neg Hx   . Alcohol abuse Neg Hx   . Asthma Neg Hx   . Birth defects Neg Hx   . Cancer Neg Hx   . Drug abuse Neg Hx   .  Early death Neg Hx   . Hearing loss Neg Hx   . Kidney disease Neg Hx   . Mental retardation Neg Hx   . Miscarriages / Stillbirths Neg Hx   . Stroke Neg Hx   . Vision loss Neg Hx     Social History   Tobacco Use  . Smoking status: Never Smoker  . Smokeless tobacco: Never Used  Substance Use Topics  . Alcohol use: No  . Drug use: No    Allergies:  Allergies  Allergen Reactions  . Chlorhexidine Itching and Rash    Severe itching, redness.   . Tramadol Nausea And Vomiting    Medications Prior to Admission  Medication Sig Dispense Refill Last Dose  . benzonatate (TESSALON) 100 MG capsule Take 1 capsule (100 mg total) by mouth every 8 (eight) hours. (Patient not taking: Reported on 09/20/2017) 21 capsule 0 Not Taking at Unknown time  . cetirizine (ZYRTEC ALLERGY) 10 MG tablet Take 1 tablet (10 mg  total) by mouth daily. 30 tablet 1 Past Week at Unknown time  . erythromycin ophthalmic ointment Place 1 application into the right eye every 6 (six) hours. Place 1/2 inch ribbon of ointment in the affected eye 4 times a day (Patient not taking: Reported on 09/20/2017) 1 g 1 Not Taking at Unknown time  . fluticasone (FLONASE) 50 MCG/ACT nasal spray Place 2 sprays into both nostrils daily. 16 g 0 Past Week at Unknown time  . ibuprofen (ADVIL,MOTRIN) 800 MG tablet Take 1 tablet (800 mg total) by mouth every 6 (six) hours as needed. (Patient taking differently: Take 800 mg by mouth every 6 (six) hours as needed for headache, mild pain, moderate pain or cramping. ) 30 tablet 0 09/19/2017 at Unknown time  . montelukast (SINGULAIR) 10 MG tablet Take 1 tablet (10 mg total) by mouth at bedtime. 30 tablet 5 Past Week at Unknown time  . naproxen (NAPROSYN) 250 MG tablet Take 1 tablet (250 mg total) by mouth 2 (two) times daily with a meal. (Patient not taking: Reported on 09/20/2017) 30 tablet 0 Not Taking at Unknown time  . ondansetron (ZOFRAN) 4 MG tablet Take 1 tablet (4 mg total) by mouth every 8 (eight) hours as needed for nausea or vomiting. 4 tablet 0 09/19/2017 at Unknown time  . oxyCODONE-acetaminophen (PERCOCET/ROXICET) 5-325 MG tablet Take 1 tablet by mouth every 6 (six) hours as needed. 4 tablet 0     Review of Systems  Constitutional: Negative for chills, fatigue and fever.  Eyes: Negative for visual disturbance.  Respiratory: Negative for chest tightness and shortness of breath.   Cardiovascular: Negative for chest pain.  Gastrointestinal: Positive for abdominal pain and nausea. Negative for blood in stool, constipation, diarrhea and vomiting.  Genitourinary: Positive for vaginal bleeding. Negative for difficulty urinating, dysuria, flank pain, frequency, hematuria and vaginal discharge.  Neurological: Positive for dizziness and light-headedness. Negative for headaches.   Physical Exam   Blood  pressure 117/75, pulse 86, temperature 98.5 F (36.9 C), resp. rate 18, not currently breastfeeding.  Physical Exam  Constitutional: She is oriented to person, place, and time. She appears well-developed and well-nourished.  HENT:  Head: Normocephalic and atraumatic.  Eyes: Conjunctivae are normal. No scleral icterus.  Cardiovascular: Normal rate, regular rhythm and normal heart sounds. Exam reveals no gallop and no friction rub.  No murmur heard. Respiratory: Effort normal and breath sounds normal. No respiratory distress.  GI: Soft. Bowel sounds are normal. She exhibits no distension and no mass.  There is tenderness. There is no rebound and no guarding.  Tenderness in all 4 quadrants.  Genitourinary: No vaginal discharge found.  Genitourinary Comments: NEFG.  Patient extremely uncomfortable during speculum exam.  Small amount of brown blood noted in vaginal vault.  No CMT.  Uterus minimally tender.  Unable to palpate adnexa due to body habitus.    Musculoskeletal: She exhibits no edema.  Neurological: She is alert and oriented to person, place, and time.  Skin: Skin is warm and dry. She is not diaphoretic.  Psychiatric: She has a normal mood and affect. Her behavior is normal.   Results for orders placed or performed during the hospital encounter of 09/26/17 (from the past 24 hour(s))  CBC     Status: Abnormal   Collection Time: 09/26/17  4:22 PM  Result Value Ref Range   WBC 12.2 (H) 4.0 - 10.5 K/uL   RBC 4.53 3.87 - 5.11 MIL/uL   Hemoglobin 10.7 (L) 12.0 - 15.0 g/dL   HCT 16.1 (L) 09.6 - 04.5 %   MCV 76.2 (L) 78.0 - 100.0 fL   MCH 23.6 (L) 26.0 - 34.0 pg   MCHC 31.0 30.0 - 36.0 g/dL   RDW 40.9 (H) 81.1 - 91.4 %   Platelets 357 150 - 400 K/uL    MAU Course  Toradol 60 mg  Nursing notes and vital signs reviewed. Patient hemodynamically stable. The patient's pain and symptoms improved since admission to the MAU prior to medication.  MDM: The patient's differential for  abdominal pain includes GI etiology, PID, appendicitis, ovarian torsion.  A GI etiology is the most likely cause for her pain due to the upper abdomen being the source of the pain in conjunction with a lack of gynecological findings.  PID is considered but is less likely since the patient just completed a course for Chlamydia and lack of CMT or discharge on exam.  Appendicitis is unlikely due to lack of fever and no rebound/guarding on physical exam.  Ovarian torsion is unlikely due to lack of adnexal pain.  Patient's pain improved during admission, and she was given Toradol 60mg  for continued pain control.  CBC ordered to assess for possible infection.  We talked about the need to meet with a PCP to follow up on her abdominal pain, which the patient agreed with.   Assessment and Plan   1. Generalized abdominal pain   2. Post-operative state    Plan:  -- discharge home in stable condition -- ibuprofen or Aleve PRN for pain -- patient to f/u with PCP -- return precautions given   Mikal Plane 09/26/2017, 5:38 PM   I confirm that I have verified the information documented in the student's note and that I have also personally reperformed the physical exam and all medical decision making activities.  Donette Larry, CNM  09/26/2017 5:55 PM

## 2017-09-26 NOTE — MAU Note (Signed)
Urine is in the Lab 

## 2017-10-04 ENCOUNTER — Ambulatory Visit (HOSPITAL_COMMUNITY)
Admission: EM | Admit: 2017-10-04 | Discharge: 2017-10-04 | Disposition: A | Discharge status: Home / Self Care | Payer: 59 | Attending: Internal Medicine | Admitting: Internal Medicine

## 2017-10-04 ENCOUNTER — Encounter (HOSPITAL_COMMUNITY): Payer: Self-pay | Admitting: Emergency Medicine

## 2017-10-04 DIAGNOSIS — R1011 Right upper quadrant pain: Secondary | ICD-10-CM | POA: Diagnosis not present

## 2017-10-04 MED ORDER — OMEPRAZOLE 20 MG PO CPDR: 20.0000 mg | DELAYED_RELEASE_CAPSULE | Freq: Every day | ORAL | 0 refills | Status: DC

## 2017-10-04 NOTE — ED Triage Notes (Signed)
PT C/O: abd pain onset 3 weeks associated w/SOB, diarrhea, "bluish" tint to the stool  TAKING MEDS: Ibuprofen   A&O x4... NAD... Ambulatory

## 2017-10-04 NOTE — Discharge Instructions (Signed)
Pain does seem likely related to your gallbladder.  You did not need emergent evaluation.  Please try and follow-up with primary care sooner than May to discuss need for ultrasound outpatient.  Please start taking Prilosec daily I have sent this to [facility].  This may help with acid and ultimately pain.  Please review document attached regarding foods to avoid with gallbladder pain.  Please continue to monitor your bowels for return to normal.  Please go to emergency room if you develop fever, nausea, vomiting, worsening pain, yellow appearance, dizziness, lightheadedness, blood in stool.

## 2017-10-05 NOTE — ED Provider Notes (Signed)
[facility]    CSN: [id number] Arrival date & time: 10/04/17  1646     History   Chief Complaint Chief Complaint  Patient presents with  . Abdominal Pain    HPI Traci Castro is a 22 y.o. female history of gastritis presenting today with abdominal pain.  She states her pain starts from her lower ribs and goes up to her chest on both sides.  Pain has Been coming and going for approximately 3 weeks.  She states the pain will last for approximately 2 hours and is associated with shortness of breath.  She is also concerned because she has had approximately 2-3 bowel movements that have been turquoise her had a bluish tent.  She denies any diarrhea but states that her bowels have been loose.  She also endorses having a D&C last week after having an abortion on the 21st.  She denies obvious blood in her bowels.  She has been ibuprofen as needed.  She does say that it does worsen after she eats, especially when she eats spicy and red sauce.  States pain will wake her up out of her sleep.  She states that many people in her family have had cholecystectomies.  Denies any fever, nausea, vomiting.  She does not have active pain at time of visit.  Denies lightheadedness and dizziness.  Denies worsening pain with exertion or deep breaths.  HPI  Past Medical History:  Diagnosis Date  . Anemia   . Anxiety    no meds  . Bipolar disorder (HCC)   . Depression    no meds  . Gastritis    2 bouts  . H/O seasonal allergies   . Migraine   . Psoriasis   . SVD (spontaneous vaginal delivery)    x 1  . Vision abnormalities    wears glasses    Patient Active Problem List   Diagnosis Date Noted  . Chlamydia 09/25/2017  . Gestation size to date discrepancy 04/16/2016  . BMI 45.0-49.9, adult (HCC) 03/15/2016    Past Surgical History:  Procedure Laterality Date  . DILATION AND EVACUATION N/A 09/20/2017   Procedure: SUCTION DILATATION AND EVACUATION;  Surgeon: War BingPickens, Charlie, MD;   Location: [facility];  Service: Gynecology;  Laterality: N/A;  . ESOPHAGOGASTRODUODENOSCOPY N/A 10/17/2012   Procedure: ESOPHAGOGASTRODUODENOSCOPY (EGD);  Surgeon: Jon GillsJoseph H Clark, MD;  Location: [facility];  Service: Gastroenterology;  Laterality: N/A;  . THERAPEUTIC ABORTION  09/16/2017   Clinic  . WISDOM TOOTH EXTRACTION      OB History    Gravida Para Term Preterm AB Living   2 1 1   1 1    SAB TAB Ectopic Multiple Live Births     1   0 1       Home Medications    Prior to Admission medications   Medication Sig Start Date End Date Taking? Authorizing Provider  ibuprofen (ADVIL,MOTRIN) 800 MG tablet Take 1 tablet (800 mg total) by mouth every 6 (six) hours as needed. Patient taking differently: Take 800 mg by mouth every 6 (six) hours as needed for headache, mild pain, moderate pain or cramping.  07/22/17  Yes Blue, Olivia C, PA-C  cetirizine (ZYRTEC ALLERGY) 10 MG tablet Take 1 tablet (10 mg total) by mouth daily. 12/29/16   Everlene Farrieransie, William, PA-C  fluticasone (FLONASE) 50 MCG/ACT nasal spray Place 2 sprays into both nostrils daily. 07/24/17   Everlene Farrieransie, William, PA-C  montelukast (SINGULAIR) 10 MG tablet Take 1 tablet (10 mg total)  by mouth at bedtime. 02/21/16   Kozlow, Alvira Philips, MD  naproxen (NAPROSYN) 250 MG tablet Take 1 tablet (250 mg total) by mouth 2 (two) times daily with a meal. Patient not taking: Reported on 09/20/2017 12/29/16   Everlene Farrier, PA-C  omeprazole (PRILOSEC) 20 MG capsule Take 1 capsule (20 mg total) by mouth daily for 14 days. 10/04/17 10/18/17  Wieters, Hallie C, PA-C  ondansetron (ZOFRAN) 4 MG tablet Take 1 tablet (4 mg total) by mouth every 8 (eight) hours as needed for nausea or vomiting. 07/22/17   Blue, Marylene Land, PA-C    Family History Family History  Problem Relation Age of Onset  . Cholelithiasis Mother   . Hyperlipidemia Mother   . Hypertension Mother   . Mental illness Mother   . Depression Mother   . Cholelithiasis Maternal Uncle   . Hyperlipidemia Maternal  Uncle   . Hypertension Maternal Uncle   . Cholelithiasis Maternal Grandmother   . Arthritis Maternal Grandmother   . COPD Maternal Grandmother   . Depression Maternal Grandmother   . Diabetes Maternal Grandmother   . Heart disease Maternal Grandmother   . Hyperlipidemia Maternal Grandmother   . Hypertension Maternal Grandmother   . Learning disabilities Paternal Grandmother   . Mental illness Paternal Grandmother   . Hyperlipidemia Paternal Grandfather   . Diabetes Paternal Grandfather   . Ulcers Neg Hx   . Alcohol abuse Neg Hx   . Asthma Neg Hx   . Birth defects Neg Hx   . Cancer Neg Hx   . Drug abuse Neg Hx   . Early death Neg Hx   . Hearing loss Neg Hx   . Kidney disease Neg Hx   . Mental retardation Neg Hx   . Miscarriages / Stillbirths Neg Hx   . Stroke Neg Hx   . Vision loss Neg Hx     Social History Social History   Tobacco Use  . Smoking status: Never Smoker  . Smokeless tobacco: Never Used  Substance Use Topics  . Alcohol use: No  . Drug use: No     Allergies   Chlorhexidine and Tramadol   Review of Systems Review of Systems  Constitutional: Negative for chills and fever.  HENT: Negative for congestion, ear pain and sore throat.   Eyes: Negative for pain and visual disturbance.  Respiratory: Negative for cough and shortness of breath.   Cardiovascular: Negative for chest pain and palpitations.  Gastrointestinal: Positive for abdominal pain. Negative for blood in stool, diarrhea, nausea and vomiting.  Genitourinary: Negative for dysuria, flank pain, frequency, hematuria, vaginal bleeding, vaginal discharge and vaginal pain.  Musculoskeletal: Negative for back pain.  Skin: Negative for color change and rash.  Neurological: Negative for seizures and syncope.  All other systems reviewed and are negative.    Physical Exam Triage Vital Signs ED Triage Vitals  Enc Vitals Group     BP 10/04/17 1804 122/66     Pulse Rate 10/04/17 1804 77     Resp  10/04/17 1804 20     Temp 10/04/17 1804 98.6 F (37 C)     Temp Source 10/04/17 1804 Oral     SpO2 10/04/17 1804 100 %     Weight --      Height --      Head Circumference --      Peak Flow --      Pain Score 10/04/17 1805 3     Pain Loc --  Pain Edu? --      Excl. in GC? --    No data found.  Updated Vital Signs BP 122/66 (BP Location: Left Arm)   Pulse 77   Temp 98.6 F (37 C) (Oral)   Resp 20   LMP 07/29/2017   SpO2 100%   Breastfeeding? No   Visual Acuity Right Eye Distance:   Left Eye Distance:   Bilateral Distance:    Right Eye Near:   Left Eye Near:    Bilateral Near:     Physical Exam  Constitutional: She appears well-developed and well-nourished. No distress.  HENT:  Head: Normocephalic and atraumatic.  Mouth/Throat: Oropharynx is clear and moist.  Eyes: Conjunctivae and EOM are normal. Pupils are equal, round, and reactive to light.  Neck: Neck supple.  Cardiovascular: Normal rate and regular rhythm.  No murmur heard. Pulmonary/Chest: Effort normal and breath sounds normal. No respiratory distress.  Chest nontender to palpation to anterior and posterior chest/ribs.  Abdominal: Soft. There is no tenderness. There is no CVA tenderness.  Abdomen soft, patient not on guarding, bowel sounds present in all 4 quadrants.  Nontender to light and deep palpation in all 4 quadrants.  Negative Murphy's, negative McBurney's.  Musculoskeletal: She exhibits no edema.  Neurological: She is alert.  Skin: Skin is warm and dry.  Psychiatric: She has a normal mood and affect.  Nursing note and vitals reviewed.    UC Treatments / Results  Labs (all labs ordered are listed, but only abnormal results are displayed) Labs Reviewed - No data to display  EKG  EKG Interpretation None       Radiology No results found.  Procedures Procedures (including critical care time)  Medications Ordered in UC Medications - No data to display   Initial Impression /  Assessment and Plan / UC Course  I have reviewed the triage vital signs and the nursing notes.  Pertinent labs & imaging results that were available during my care of the patient were reviewed by me and considered in my medical decision making (see chart for details).     Abdominal exam not concerning for an acute abdomen or peritonitis.  Pain related to eating and is intermittent.  Likely gallbladder pain versus reflux.  Advised outpatient follow-up for evaluation of gallstones/gallbladder inflammation.  with increased pain with spicy and red sauces, also reflux possible.  We will go ahead and start on a PPI daily.  Provided information to establish care with primary care.  Advised patient to return to the emergency room if she develops worsening pain, more persistent or frequent pain, develops associated nausea and vomiting, fever,   Noticing any blood in stools.  Advised patient to continue to monitor stools for any development of blood, lightheadedness or dizziness.Discussed strict return precautions. Patient verbalized understanding and is agreeable with plan.   Final Clinical Impressions(s) / UC Diagnoses   Final diagnoses:  Right upper quadrant abdominal pain    ED Discharge Orders        Ordered    omeprazole (PRILOSEC) 20 MG capsule  Daily     10/04/17 1836       Controlled Substance Prescriptions [place] Controlled Substance Registry consulted? Not Applicable   Lew Dawes, [place] 10/05/17 1635

## 2017-10-11 ENCOUNTER — Ambulatory Visit: Payer: 59 | Admitting: Obstetrics and Gynecology

## 2017-10-16 ENCOUNTER — Ambulatory Visit (INDEPENDENT_AMBULATORY_CARE_PROVIDER_SITE_OTHER): Payer: 59 | Admitting: Obstetrics and Gynecology

## 2017-10-16 ENCOUNTER — Encounter: Payer: Self-pay | Admitting: Obstetrics and Gynecology

## 2017-10-16 VITALS — BP 111/75 | HR 85 | Wt 298.5 lb

## 2017-10-16 DIAGNOSIS — Z09 Encounter for follow-up examination after completed treatment for conditions other than malignant neoplasm: Secondary | ICD-10-CM

## 2017-10-16 DIAGNOSIS — Z3041 Encounter for surveillance of contraceptive pills: Secondary | ICD-10-CM

## 2017-10-16 MED ORDER — LO LOESTRIN FE 1 MG-10 MCG / 10 MCG PO TABS: 1.0000 | ORAL_TABLET | Freq: Every day | ORAL | 3 refills | Status: DC

## 2017-10-16 NOTE — Progress Notes (Signed)
Pt stated no bleeding except went to ED abdominal pain and requesting to have ultrasound.

## 2017-10-16 NOTE — Patient Instructions (Signed)
Take a home pregnancy test in a month, if you haven't had a period yet and give the office a call.

## 2017-10-16 NOTE — Progress Notes (Signed)
[facility] 10/16/2017  CC: regular post op visit  Subjective:    S/p 1/25 suction d&c for rPOCs after elective AB at an outside facility. She was discharged to home from the pacu.   She hasn't had a period yet but she states that she started her sprintec that she already had about a week after surgery and that she started having sex about a week ago. She denies any fevers, chills, pain.     Objective:    BP 111/75   Pulse 85   Wt 298 lb 8 oz (135.4 kg)   Breastfeeding? No   BMI 52.88 kg/m   NAD Abdomen: obese, soft, nttp  Assessment:    Doing well postoperatively.    Plan:  *Post Op: d/w her that if she hasn't gotten a period by late march to take a home upt but that she should get a period during her placebo week; if she doesn't she was told to take a home UPT and call us.  Contraception: She states she's been on OCPs since she was teenager as has forgotten to take them twice but got pregnant both those times. I d/w her re: other types of BC but she'd like to stay with pills. D/w her re: estrogen risk with pills and she'd like to continue but she is amenable to doing a lower dose. Will try lo loestrin and if not covered by insurance with then do loestrin. Pt told to start once she is done with her current pack. 15 minute of face to face time with pt re: this.  PCP: list of providers given to her to establish care and importance of this d/w her.   RTC 9322m for annual   Cornelia Copaharlie Noboru Bidinger, Jr MD Attending

## 2017-11-14 ENCOUNTER — Ambulatory Visit (HOSPITAL_COMMUNITY)
Admission: EM | Admit: 2017-11-14 | Discharge: 2017-11-14 | Disposition: A | Discharge status: Home / Self Care | Payer: 59 | Attending: Family Medicine | Admitting: Family Medicine

## 2017-11-14 ENCOUNTER — Encounter (HOSPITAL_COMMUNITY): Payer: Self-pay | Admitting: Emergency Medicine

## 2017-11-14 ENCOUNTER — Other Ambulatory Visit: Payer: Self-pay

## 2017-11-14 DIAGNOSIS — J069 Acute upper respiratory infection, unspecified: Secondary | ICD-10-CM | POA: Diagnosis not present

## 2017-11-14 IMAGING — US US ABDOMEN LIMITED
1 series · 15 of 25 positions shown · non-contrast
Comparison: Abdominal and pelvic CT scan dated August 29, 2013

CLINICAL DATA: Two weeks of constant right upper quadrant pain
associated with nausea. The patient is 35 weeks pregnant.

EXAM:
US ABDOMEN LIMITED - RIGHT UPPER QUADRANT

[Series 1: us abdomen limited · 15 of 54 slices shown]
[im 1/54]
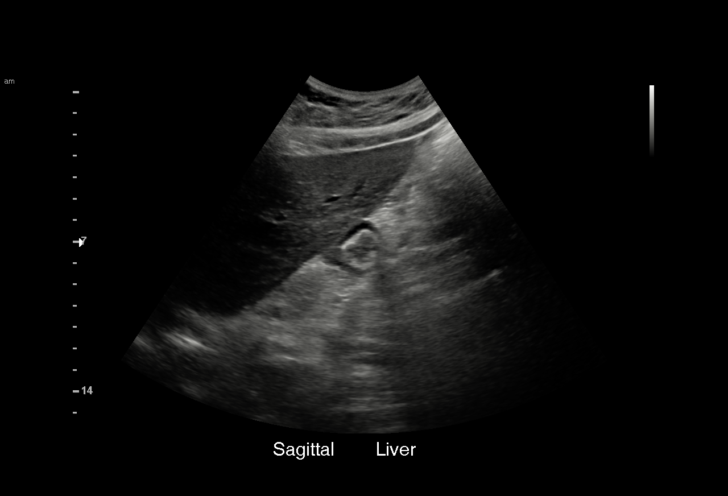
[im 5/54]
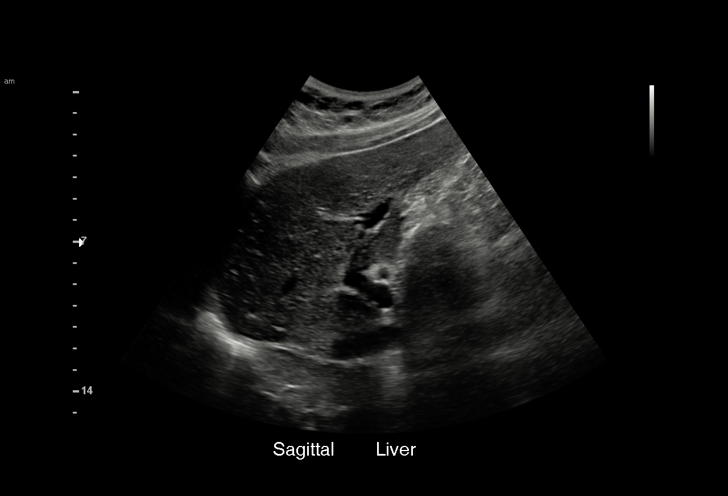
[im 9/54]
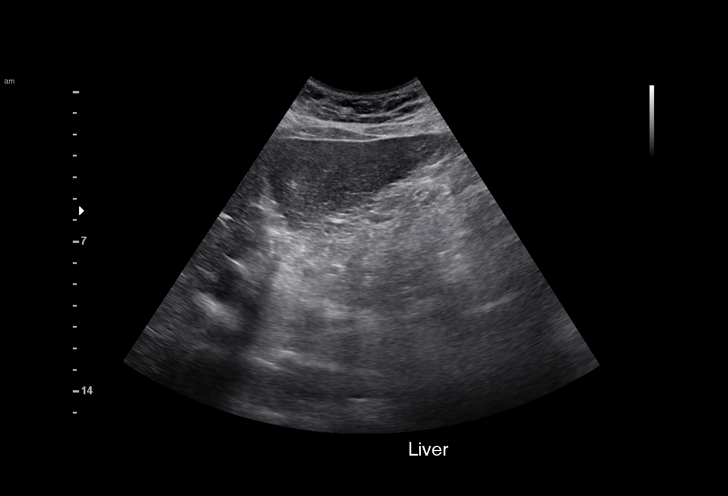
[im 12/54]
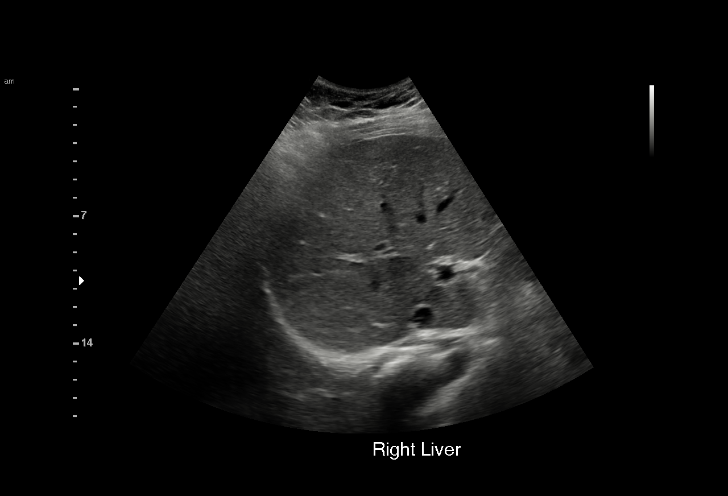
[im 16/54]
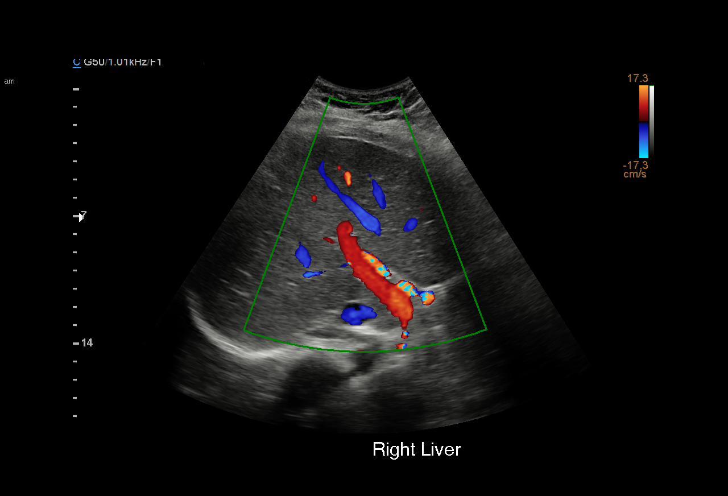
[im 20/54]
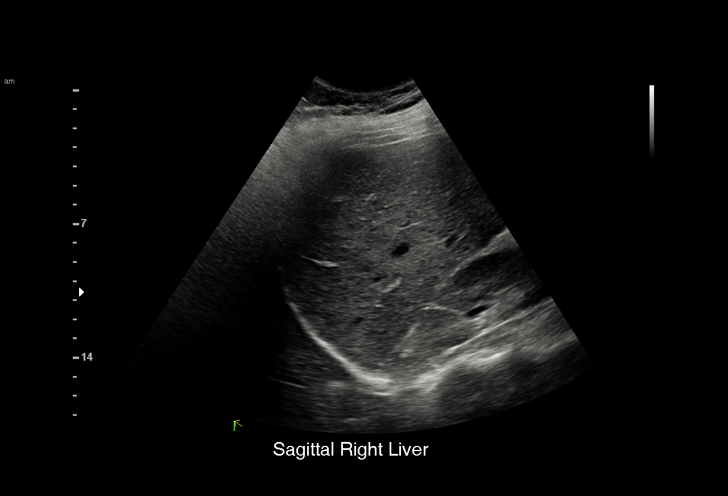
[im 23/54]
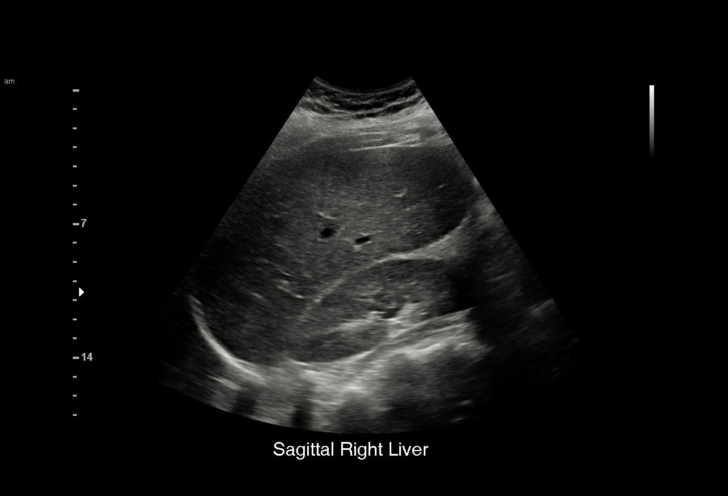
[im 27/54]
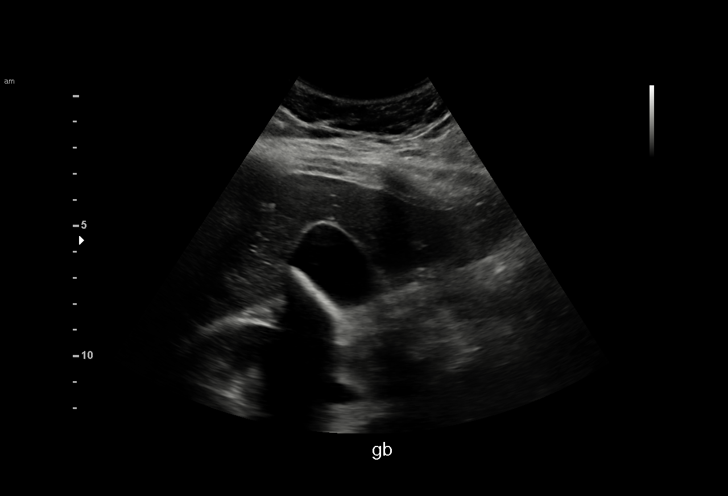
[im 31/54]
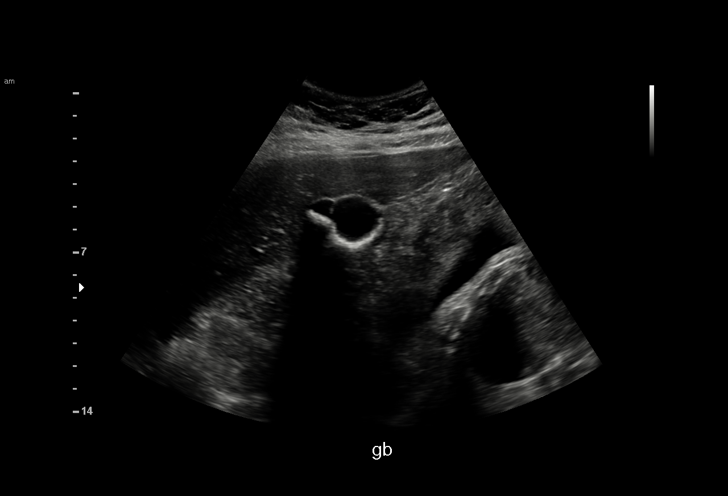
[im 34/54]
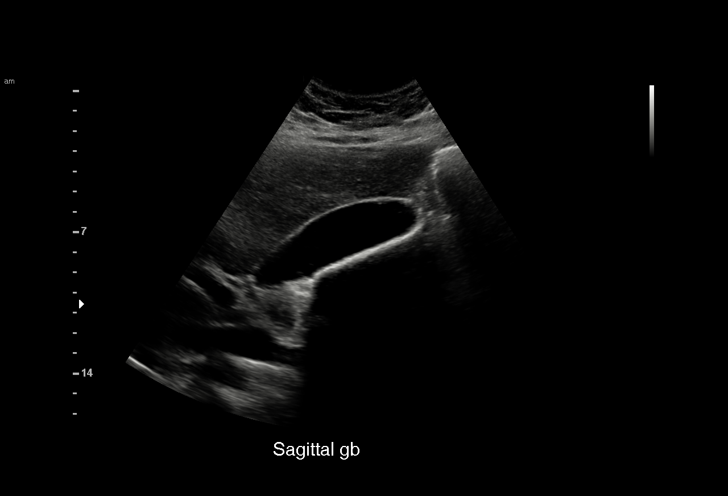
[im 38/54]
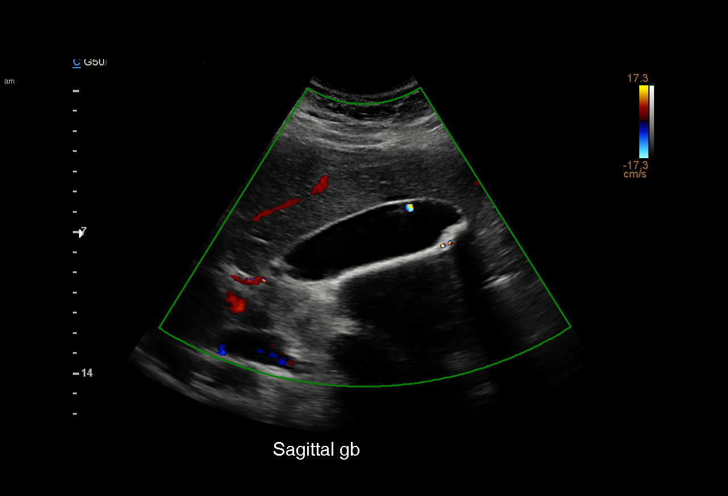
[im 42/54]
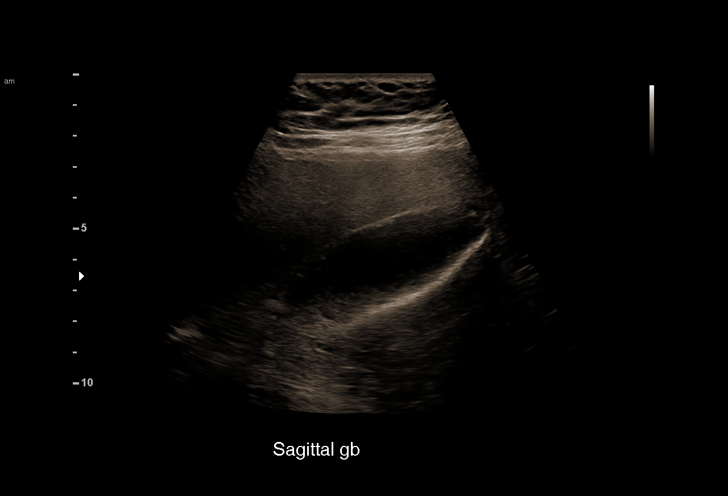
[im 45/54]
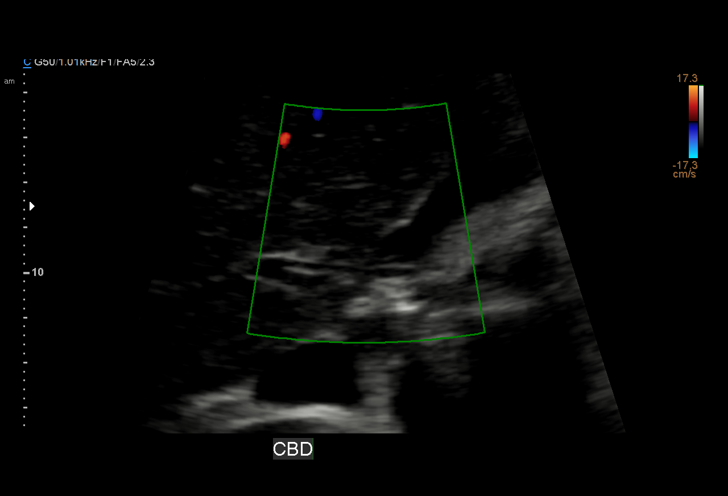
[im 49/54]
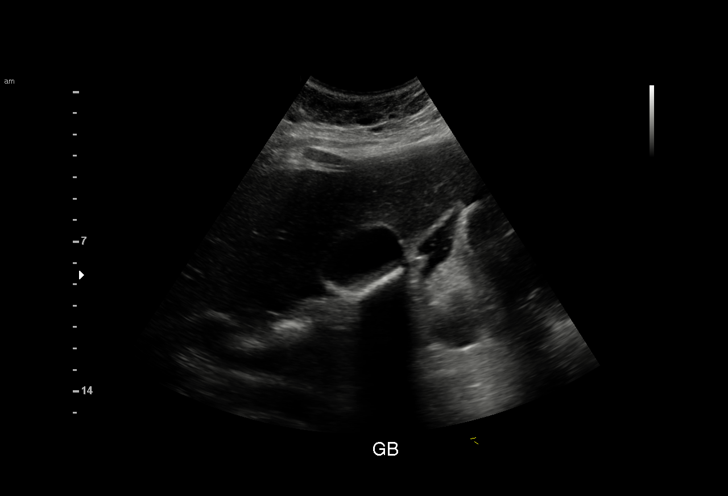
[im 54/54]
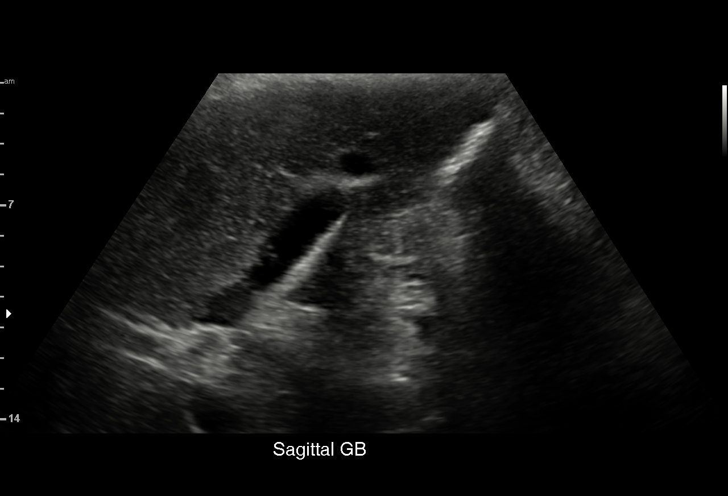

[15 of 25 positions shown; findings below may reference images not displayed]

FINDINGS: Gallbladder:

The gallbladder is adequately distended. There is echogenic material
layering in the dependent portion of the gallbladder with some
distal shadowing consistent with tiny stones or sludge. There is no
gallbladder wall thickening, pericholecystic fluid, or positive
sonographic Murphy's sign. A Phrygian cap is observed.

Common bile duct:

Diameter: 2.3 mm

Liver:

The hepatic echotexture is normal. There is no focal mass nor ductal
dilation.
IMPRESSION: Sludge versus innumerable tiny stones in the gallbladder. No
sonographic evidence of acute cholecystitis.

Normal appearance of the liver and common bile duct.

## 2017-11-14 MED ORDER — ONDANSETRON 8 MG PO TBDP: 8.0000 mg | ORAL_TABLET | Freq: Three times a day (TID) | ORAL | 0 refills | Status: DC | PRN

## 2017-11-14 NOTE — ED Provider Notes (Signed)
[facility]    CSN: [id number] Arrival date & time: 11/14/17  4098     History   Chief Complaint Chief Complaint  Patient presents with  . Otalgia    left  . Nausea    HPI Traci Castro is a 22 y.o. female.   Subjective:   History was provided by the patient.  Traci Castro is a 21 y.o. female who presents for evaluation of symptoms of a URI. Symptoms include chest congestion, dry cough, left ear pain, headache, clear nasal discharge, pain while swallowing, post nasal drip and sore throat. Onset of symptoms was 2 days ago and has been unchanged since that time. Associated symptoms include nausea with one episode of vomiting today.  She is drinking plenty of fluids. Reports decreased appetite. Evaluation to date: none. Treatment to date: OTC cough/cold medications.   The following portions of the patient's history were reviewed and updated as appropriate: allergies, current medications, past family history, past medical history, past social history, past surgical history and problem list.        Past Medical History:  Diagnosis Date  . Anemia   . Anxiety    no meds  . Bipolar disorder (HCC)   . Depression    no meds  . Gastritis    2 bouts  . H/O seasonal allergies   . Migraine   . Psoriasis   . SVD (spontaneous vaginal delivery)    x 1  . Vision abnormalities    wears glasses    Patient Active Problem List   Diagnosis Date Noted  . Chlamydia 09/25/2017  . Gestation size to date discrepancy 04/16/2016  . BMI 45.0-49.9, adult (HCC) 03/15/2016    Past Surgical History:  Procedure Laterality Date  . DILATION AND EVACUATION N/A 09/20/2017   Procedure: SUCTION DILATATION AND EVACUATION;  Surgeon: Graceton Bing, MD;  Location: [facility];  Service: Gynecology;  Laterality: N/A;  . ESOPHAGOGASTRODUODENOSCOPY N/A 10/17/2012   Procedure: ESOPHAGOGASTRODUODENOSCOPY (EGD);  Surgeon: Jon Gills, MD;  [facility]: [facility];  Service: Gastroenterology;   Laterality: N/A;  . THERAPEUTIC ABORTION  09/16/2017   Clinic  . WISDOM TOOTH EXTRACTION      OB History    Gravida  2   Para  1   Term  1   Preterm      AB  1   Living  1     SAB      TAB  1   Ectopic      Multiple  0   Live Births  1            Home Medications    Prior to Admission medications   Medication Sig Start Date End Date Taking? Authorizing Provider  LO LOESTRIN FE 1 MG-10 MCG / 10 MCG tablet Take 1 tablet by mouth daily. Start this after you finish your current pack of pills 10/16/17  Yes Watkins Bing, MD  cetirizine (ZYRTEC ALLERGY) 10 MG tablet Take 1 tablet (10 mg total) by mouth daily. 12/29/16   Everlene Farrier, PA-C  fluticasone (FLONASE) 50 MCG/ACT nasal spray Place 2 sprays into both nostrils daily. 07/24/17   Everlene Farrier, PA-C  ibuprofen (ADVIL,MOTRIN) 800 MG tablet Take 1 tablet (800 mg total) by mouth every 6 (six) hours as needed. Patient taking differently: Take 800 mg by mouth every 6 (six) hours as needed for headache, mild pain, moderate pain or cramping.  07/22/17   Blue, Olivia C, PA-C  montelukast (SINGULAIR) 10 MG  tablet Take 1 tablet (10 mg total) by mouth at bedtime. 02/21/16   Kozlow, Alvira Philips, MD  naproxen (NAPROSYN) 250 MG tablet Take 1 tablet (250 mg total) by mouth 2 (two) times daily with a meal. 12/29/16   Everlene Farrier, PA-C  omeprazole (PRILOSEC) 20 MG capsule Take 1 capsule (20 mg total) by mouth daily for 14 days. 10/04/17 10/18/17  Wieters, Hallie C, PA-C  ondansetron (ZOFRAN) 4 MG tablet Take 1 tablet (4 mg total) by mouth every 8 (eight) hours as needed for nausea or vomiting. 07/22/17   Blue, Marylene Land, PA-C    Family History Family History  Problem Relation Age of Onset  . Cholelithiasis Mother   . Hyperlipidemia Mother   . Hypertension Mother   . Mental illness Mother   . Depression Mother   . Cholelithiasis Maternal Uncle   . Hyperlipidemia Maternal Uncle   . Hypertension Maternal Uncle   . Cholelithiasis  Maternal Grandmother   . Arthritis Maternal Grandmother   . COPD Maternal Grandmother   . Depression Maternal Grandmother   . Diabetes Maternal Grandmother   . Heart disease Maternal Grandmother   . Hyperlipidemia Maternal Grandmother   . Hypertension Maternal Grandmother   . Learning disabilities Paternal Grandmother   . Mental illness Paternal Grandmother   . Hyperlipidemia Paternal Grandfather   . Diabetes Paternal Grandfather   . Ulcers Neg Hx   . Alcohol abuse Neg Hx   . Asthma Neg Hx   . Birth defects Neg Hx   . Cancer Neg Hx   . Drug abuse Neg Hx   . Early death Neg Hx   . Hearing loss Neg Hx   . Kidney disease Neg Hx   . Mental retardation Neg Hx   . Miscarriages / Stillbirths Neg Hx   . Stroke Neg Hx   . Vision loss Neg Hx     Social History Social History   Tobacco Use  . Smoking status: Never Smoker  . Smokeless tobacco: Never Used  Substance Use Topics  . Alcohol use: No  . Drug use: No     Allergies   Chlorhexidine and Tramadol   Review of Systems Review of Systems  HENT: Positive for congestion, ear pain, postnasal drip, rhinorrhea and sore throat.   Respiratory: Positive for cough. Negative for shortness of breath.   Cardiovascular: Negative for chest pain.  Gastrointestinal: Positive for nausea and vomiting. Negative for constipation and diarrhea.  Neurological: Positive for headaches.  All other systems reviewed and are negative.    Physical Exam Triage Vital Signs ED Triage Vitals  Enc Vitals Group     BP 11/14/17 1013 (!) 157/96     Pulse Rate 11/14/17 1013 89     Resp --      Temp 11/14/17 1013 98.1 F (36.7 C)     Temp Source 11/14/17 1013 Oral     SpO2 11/14/17 1013 99 %     Weight --      Height --      Head Circumference --      Peak Flow --      Pain Score 11/14/17 1010 6     Pain Loc --      Pain Edu? --      Excl. in GC? --    No data found.  Updated Vital Signs BP (!) 157/96 (BP Location: Right Wrist)   Pulse 89    Temp 98.1 F (36.7 C) (Oral)   LMP 10/19/2017 (Exact  Date)   SpO2 99%   Visual Acuity Right Eye Distance:   Left Eye Distance:   Bilateral Distance:    Right Eye Near:   Left Eye Near:    Bilateral Near:     Physical Exam  Constitutional: She is oriented to person, place, and time. She appears well-developed and well-nourished.  HENT:  Head: Normocephalic.  Right Ear: External ear normal.  Left Ear: External ear normal.  Mouth/Throat: Oropharynx is clear and moist. No oropharyngeal exudate.  Eyes: Pupils are equal, round, and reactive to light. Conjunctivae and EOM are normal.  Neck: Normal range of motion. Neck supple.  Cardiovascular: Normal rate, regular rhythm and normal heart sounds.  Pulmonary/Chest: Effort normal and breath sounds normal.  Musculoskeletal: Normal range of motion.  Neurological: She is alert and oriented to person, place, and time.  Skin: Skin is warm and dry.  Psychiatric: She has a normal mood and affect.     UC Treatments / Results  Labs (all labs ordered are listed, but only abnormal results are displayed) Labs Reviewed - No data to display  EKG  EKG Interpretation None       Radiology No results found.  Procedures Procedures (including critical care time)  Medications Ordered in UC Medications - No data to display   Initial Impression / Assessment and Plan / UC Course  I have reviewed the triage vital signs and the nursing notes.  Pertinent labs & imaging results that were available during my care of the patient were reviewed by me and considered in my medical decision making (see chart for details).     22 year old female presenting with URI. Vital signs stable. Nontoxic appearance. Likely viral in etiology. Discussed diagnosis and treatment of URI. Discussed the importance of avoiding unnecessary antibiotic therapy. Suggested symptomatic OTC remedies. Nasal saline spray for congestion. Zofran PRN. Discussed diagnosis and  treatment with patient. All questions have been answered and all concerns have been addressed. The patient verbalized understanding and had no further questions. Follow up as needed.  Final Clinical Impressions(s) / UC Diagnoses   Final diagnoses:  Viral upper respiratory tract infection    ED Discharge Orders    None       Controlled Substance Prescriptions [place] Controlled Substance Registry consulted? Not Applicable   Lurline IdolMurrill, Juleen Sorrels, FNP 11/14/17 1029

## 2017-11-14 NOTE — ED Triage Notes (Signed)
Pt reports URI symptoms with left ear pain that started two days ago and new onset nausea today.

## 2017-12-03 ENCOUNTER — Other Ambulatory Visit: Payer: Self-pay

## 2017-12-03 ENCOUNTER — Encounter (HOSPITAL_COMMUNITY): Payer: Self-pay | Admitting: Emergency Medicine

## 2017-12-03 ENCOUNTER — Emergency Department (HOSPITAL_COMMUNITY)
Admission: EM | Admit: 2017-12-03 | Discharge: 2017-12-03 | Disposition: A | Discharge status: Home / Self Care | Payer: 59 | Attending: Emergency Medicine | Admitting: Emergency Medicine

## 2017-12-03 DIAGNOSIS — R1030 Lower abdominal pain, unspecified: Secondary | ICD-10-CM | POA: Insufficient documentation

## 2017-12-03 DIAGNOSIS — Z79899 Other long term (current) drug therapy: Secondary | ICD-10-CM | POA: Diagnosis not present

## 2017-12-03 DIAGNOSIS — R112 Nausea with vomiting, unspecified: Secondary | ICD-10-CM | POA: Diagnosis present

## 2017-12-03 DIAGNOSIS — J302 Other seasonal allergic rhinitis: Secondary | ICD-10-CM | POA: Diagnosis not present

## 2017-12-03 LAB — COMPREHENSIVE METABOLIC PANEL
ALT: 13 U/L — ABNORMAL LOW (ref 14–54)
ANION GAP: 8 (ref 5–15)
AST: 16 U/L (ref 15–41)
Albumin: 3.8 g/dL (ref 3.5–5.0)
Alkaline Phosphatase: 74 U/L (ref 38–126)
BUN: 14 mg/dL (ref 6–20)
CHLORIDE: 106 mmol/L (ref 101–111)
CO2: 23 mmol/L (ref 22–32)
Calcium: 9 mg/dL (ref 8.9–10.3)
Creatinine, Ser: 0.86 mg/dL (ref 0.44–1.00)
Glucose, Bld: 89 mg/dL (ref 65–99)
POTASSIUM: 4.2 mmol/L (ref 3.5–5.1)
Sodium: 137 mmol/L (ref 135–145)
TOTAL PROTEIN: 7.3 g/dL (ref 6.5–8.1)
Total Bilirubin: 0.4 mg/dL (ref 0.3–1.2)

## 2017-12-03 LAB — URINALYSIS, ROUTINE W REFLEX MICROSCOPIC
BILIRUBIN URINE: NEGATIVE
GLUCOSE, UA: 50 mg/dL — AB
Ketones, ur: NEGATIVE mg/dL
NITRITE: NEGATIVE
Protein, ur: NEGATIVE mg/dL
SPECIFIC GRAVITY, URINE: 1.016 (ref 1.005–1.030)
pH: 6 (ref 5.0–8.0)

## 2017-12-03 LAB — WET PREP, GENITAL
CLUE CELLS WET PREP: NONE SEEN
Sperm: NONE SEEN
Trich, Wet Prep: NONE SEEN
Yeast Wet Prep HPF POC: NONE SEEN

## 2017-12-03 LAB — LIPASE, BLOOD: LIPASE: 50 U/L (ref 11–51)

## 2017-12-03 LAB — CBC
HEMATOCRIT: 38.5 % (ref 36.0–46.0)
Hemoglobin: 11.8 g/dL — ABNORMAL LOW (ref 12.0–15.0)
MCH: 23.8 pg — ABNORMAL LOW (ref 26.0–34.0)
MCHC: 30.6 g/dL (ref 30.0–36.0)
MCV: 77.6 fL — AB (ref 78.0–100.0)
PLATELETS: 301 10*3/uL (ref 150–400)
RBC: 4.96 MIL/uL (ref 3.87–5.11)
RDW: 15.3 % (ref 11.5–15.5)
WBC: 8.5 10*3/uL (ref 4.0–10.5)

## 2017-12-03 LAB — I-STAT BETA HCG BLOOD, ED (MC, WL, AP ONLY)

## 2017-12-03 MED ORDER — STERILE WATER FOR INJECTION IJ SOLN: 0.9000 mL | Freq: Once | INTRAMUSCULAR | Status: DC

## 2017-12-03 MED ORDER — ONDANSETRON HCL 4 MG/2ML IJ SOLN
4.0000 mg | Freq: Once | INTRAMUSCULAR | Status: AC
  Administered 2017-12-03: 4 mg via INTRAVENOUS
  Filled 2017-12-03: qty 2

## 2017-12-03 MED ORDER — AZITHROMYCIN 250 MG PO TABS
1000.0000 mg | ORAL_TABLET | Freq: Once | ORAL | Status: AC
  Administered 2017-12-03: 1000 mg via ORAL
  Filled 2017-12-03: qty 4

## 2017-12-03 MED ORDER — STERILE WATER FOR INJECTION IJ SOLN
10.0000 mL | Freq: Once | INTRAMUSCULAR | Status: AC
  Administered 2017-12-03: 0.9 mL via INTRAMUSCULAR
  Filled 2017-12-03: qty 10

## 2017-12-03 MED ORDER — CEFTRIAXONE SODIUM 250 MG IJ SOLR
250.0000 mg | Freq: Once | INTRAMUSCULAR | Status: AC
  Administered 2017-12-03: 250 mg via INTRAMUSCULAR
  Filled 2017-12-03: qty 250

## 2017-12-03 MED ORDER — KETOROLAC TROMETHAMINE 30 MG/ML IJ SOLN
30.0000 mg | Freq: Once | INTRAMUSCULAR | Status: AC
  Administered 2017-12-03: 30 mg via INTRAVENOUS
  Filled 2017-12-03: qty 1

## 2017-12-03 MED ORDER — SODIUM CHLORIDE 0.9 % IV BOLUS
1000.0000 mL | Freq: Once | INTRAVENOUS | Status: AC
  Administered 2017-12-03: 1000 mL via INTRAVENOUS

## 2017-12-03 NOTE — ED Triage Notes (Signed)
Pt states she feels like she could pass out with her dizziness.

## 2017-12-03 NOTE — Discharge Instructions (Signed)
It was my pleasure taking care of you today!   Please follow up with your primary care doctor. Call tomorrow to schedule a follow up appointment.   At this time, there does not appear to be an acute, emergent cause for your abdominal pain. However, this does not mean that your abdominal pain may not become an emergency in the future. It is VERY important that you monitor your symptoms and return to the Emergency Department if you develop any of the following symptoms:  You have a fever.  You keep throwing up and can't keep fluids down.  You pass bloody or black tarry stools.  There is bright red blood in the stool. You do not seem to be getting better.  You have any questions or concerns.

## 2017-12-03 NOTE — ED Triage Notes (Signed)
Pt states she has been having abd pain, back pain, dizziness, n/v for a few days.

## 2017-12-03 NOTE — ED Provider Notes (Signed)
[facility] EMERGENCY DEPARTMENT Provider Note   CSN: [id number] Arrival date & time: 12/03/17  4098     History   Chief Complaint Chief Complaint  Patient presents with  . Abdominal Pain  . Dizziness    HPI Traci Castro is a 22 y.o. female.  The history is provided by the patient and medical records. No language interpreter was used.  Abdominal Pain   Associated symptoms include nausea and vomiting. Pertinent negatives include diarrhea and constipation.  Dizziness  Associated symptoms: nausea and vomiting   Associated symptoms: no blood in stool and no diarrhea    Traci Castro is a 22 y.o. female  with a PMH of bipolar disorder, gastritis who presents to the Emergency Department complaining of bilateral intermittent lower abdominal pain x 1 week. Patient reports over the last week, pain has started to migrate to her low back. Associated with nausea. She also had two episodes of emesis, most recently two days ago. She does report white vaginal discharge for the last few days. Denies urinary urgency/frequency, dysuria. She was seen by urgent care for URI symptoms 2-3 weeks ago. She feels as if these symptoms have resolved. She was given zofran at that appointment. She has been taking this with some relief of nausea. No diarrhea, constipation, blood in stool, fever, chills, chest pain, shortness of breath.  Past Medical History:  Diagnosis Date  . Anemia   . Anxiety    no meds  . Bipolar disorder (HCC)   . Depression    no meds  . Gastritis    2 bouts  . H/O seasonal allergies   . Migraine   . Psoriasis   . SVD (spontaneous vaginal delivery)    x 1  . Vision abnormalities    wears glasses    Patient Active Problem List   Diagnosis Date Noted  . Chlamydia 09/25/2017  . Gestation size to date discrepancy 04/16/2016  . BMI 45.0-49.9, adult (HCC) 03/15/2016    Past Surgical History:  Procedure Laterality Date  . DILATION AND EVACUATION N/A  09/20/2017   Procedure: SUCTION DILATATION AND EVACUATION;  Surgeon: [place] Bing, MD;  Location: [facility];  Service: Gynecology;  Laterality: N/A;  . ESOPHAGOGASTRODUODENOSCOPY N/A 10/17/2012   Procedure: ESOPHAGOGASTRODUODENOSCOPY (EGD);  Surgeon: Jon Gills, MD;  Location: [facility];  Service: Gastroenterology;  Laterality: N/A;  . THERAPEUTIC ABORTION  09/16/2017   Clinic  . WISDOM TOOTH EXTRACTION       OB History    Gravida  2   Para  1   Term  1   Preterm      AB  1   Living  1     SAB      TAB  1   Ectopic      Multiple  0   Live Births  1            Home Medications    Prior to Admission medications   Medication Sig Start Date End Date Taking? Authorizing Provider  cetirizine (ZYRTEC ALLERGY) 10 MG tablet Take 1 tablet (10 mg total) by mouth daily. 12/29/16   Everlene Farrier, PA-C  fluticasone (FLONASE) 50 MCG/ACT nasal spray Place 2 sprays into both nostrils daily. 07/24/17   Everlene Farrier, PA-C  ibuprofen (ADVIL,MOTRIN) 800 MG tablet Take 1 tablet (800 mg total) by mouth every 6 (six) hours as needed. Patient taking differently: Take 800 mg by mouth every 6 (six) hours as needed for headache, mild pain,  moderate pain or cramping.  07/22/17   Blue, Olivia C, PA-C  LO LOESTRIN FE 1 MG-10 MCG / 10 MCG tablet Take 1 tablet by mouth daily. Start this after you finish your current pack of pills 10/16/17   [place] BingPickens, Charlie, MD  montelukast (SINGULAIR) 10 MG tablet Take 1 tablet (10 mg total) by mouth at bedtime. 02/21/16   Kozlow, Alvira PhilipsEric J, MD  naproxen (NAPROSYN) 250 MG tablet Take 1 tablet (250 mg total) by mouth 2 (two) times daily with a meal. 12/29/16   Everlene Farrieransie, William, PA-C  omeprazole (PRILOSEC) 20 MG capsule Take 1 capsule (20 mg total) by mouth daily for 14 days. 10/04/17 10/18/17  Wieters, Hallie C, PA-C  ondansetron (ZOFRAN ODT) 8 MG disintegrating tablet Take 1 tablet (8 mg total) by mouth every 8 (eight) hours as needed for nausea or vomiting. 11/14/17    Lurline IdolMurrill, Samantha, FNP  ondansetron (ZOFRAN) 4 MG tablet Take 1 tablet (4 mg total) by mouth every 8 (eight) hours as needed for nausea or vomiting. 07/22/17   Blue, Marylene Landlivia C, PA-C    Family History Family History  Problem Relation Age of Onset  . Cholelithiasis Mother   . Hyperlipidemia Mother   . Hypertension Mother   . Mental illness Mother   . Depression Mother   . Cholelithiasis Maternal Uncle   . Hyperlipidemia Maternal Uncle   . Hypertension Maternal Uncle   . Cholelithiasis Maternal Grandmother   . Arthritis Maternal Grandmother   . COPD Maternal Grandmother   . Depression Maternal Grandmother   . Diabetes Maternal Grandmother   . Heart disease Maternal Grandmother   . Hyperlipidemia Maternal Grandmother   . Hypertension Maternal Grandmother   . Learning disabilities Paternal Grandmother   . Mental illness Paternal Grandmother   . Hyperlipidemia Paternal Grandfather   . Diabetes Paternal Grandfather   . Ulcers Neg Hx   . Alcohol abuse Neg Hx   . Asthma Neg Hx   . Birth defects Neg Hx   . Cancer Neg Hx   . Drug abuse Neg Hx   . Early death Neg Hx   . Hearing loss Neg Hx   . Kidney disease Neg Hx   . Mental retardation Neg Hx   . Miscarriages / Stillbirths Neg Hx   . Stroke Neg Hx   . Vision loss Neg Hx     Social History Social History   Tobacco Use  . Smoking status: Never Smoker  . Smokeless tobacco: Never Used  Substance Use Topics  . Alcohol use: No  . Drug use: No     Allergies   Chlorhexidine and Tramadol   Review of Systems Review of Systems  Gastrointestinal: Positive for abdominal pain, nausea and vomiting. Negative for blood in stool, constipation and diarrhea.  Genitourinary: Positive for vaginal discharge.  All other systems reviewed and are negative.    Physical Exam Updated Vital Signs BP 113/63   Pulse 74   Temp 98.6 F (37 C) (Oral)   Resp 18   Ht 5\' 3"  (1.6 m)   Wt 128.4 kg (283 lb)   LMP 11/13/2017   SpO2 100%   BMI  50.13 kg/m   Physical Exam  Constitutional: She is oriented to person, place, and time. She appears well-developed and well-nourished. No distress.  Non-toxic appearing.  HENT:  Head: Normocephalic and atraumatic.  Cardiovascular: Normal rate, regular rhythm and normal heart sounds.  No murmur heard. Pulmonary/Chest: Effort normal and breath sounds normal. No respiratory distress.  Abdominal: Soft. Bowel sounds are normal. She exhibits no distension.  Diffuse lower abdominal tenderness without focality. No rebound or guarding. No CVA tenderness.  Genitourinary:  Genitourinary Comments: Chaperone present for exam. + white discharge. No CMT. No adnexal tenderness.  No bleeding within vaginal vault.  Musculoskeletal:  No midline C/T/L tenderness.  Neurological: She is alert and oriented to person, place, and time.  Skin: Skin is warm and dry.  Nursing note and vitals reviewed.    ED Treatments / Results  Labs (all labs ordered are listed, but only abnormal results are displayed) Labs Reviewed  WET PREP, GENITAL - Abnormal; Notable for the following components:      Result Value   WBC, Wet Prep HPF POC FEW (*)    All other components within normal limits  COMPREHENSIVE METABOLIC PANEL - Abnormal; Notable for the following components:   ALT 13 (*)    All other components within normal limits  CBC - Abnormal; Notable for the following components:   Hemoglobin 11.8 (*)    MCV 77.6 (*)    MCH 23.8 (*)    All other components within normal limits  URINALYSIS, ROUTINE W REFLEX MICROSCOPIC - Abnormal; Notable for the following components:   APPearance CLOUDY (*)    Glucose, UA 50 (*)    Hgb urine dipstick SMALL (*)    Leukocytes, UA TRACE (*)    Bacteria, UA RARE (*)    Squamous Epithelial / LPF TOO NUMEROUS TO COUNT (*)    All other components within normal limits  LIPASE, BLOOD  I-STAT BETA HCG BLOOD, ED ([facility], [facility], [facility] ONLY)  GC/CHLAMYDIA PROBE AMP ([place]) NOT AT [facility]     EKG EKG Interpretation  Date/Time:  Tuesday December 03 2017 09:32:05 EDT Ventricular Rate:  88 PR Interval:  140 QRS Duration: 84 QT Interval:  346 QTC Calculation: 418 R Axis:   83 Text Interpretation:  Normal sinus rhythm Nonspecific T wave abnormality Abnormal ECG No old tracing to compare Confirmed by Linwood Dibbles [phone]) on 12/03/2017 2:28:47 PM   Radiology No results found.  Procedures Procedures (including critical care time)  Medications Ordered in ED Medications  sodium chloride 0.9 % bolus 1,000 mL (0 mLs Intravenous Stopped 12/03/17 1833)  ondansetron (ZOFRAN) injection 4 mg (4 mg Intravenous Given 12/03/17 1602)  ketorolac (TORADOL) 30 MG/ML injection 30 mg (30 mg Intravenous Given 12/03/17 1658)  cefTRIAXone (ROCEPHIN) injection 250 mg (250 mg Intramuscular Given 12/03/17 1809)  azithromycin (ZITHROMAX) tablet 1,000 mg (1,000 mg Oral Given 12/03/17 1811)  sterile water (preservative free) injection 10 mL (0.9 mLs Intramuscular Given 12/03/17 1810)     Initial Impression / Assessment and Plan / ED Course  I have reviewed the triage vital signs and the nursing notes.  Pertinent labs & imaging results that were available during my care of the patient were reviewed by me and considered in my medical decision making (see chart for details).    Traci Castro is a 22 y.o. female who presents to ED for intermittent lower abdominal pain x 1 week. On exam, patient is well appearing, afebrile, hemodynamically stable with nonsurgical abdomen.  GU exam with no cervical motion or adnexal tenderness. Labs reviewed and reassuring.  Normal white count, lipase and electrolytes.  UA does not appear infectious - rare bacteria, trace leuks. 6-30 WBC's, but TNTC WBC's.  Wet prep with few WBC's but otherwise unremarkable.   Patient re-evaluated following fluids, zofran. She feels improved. Tolerating PO in ED without difficulty.  Repeat abdominal exam with no peritoneal signs. Patient does not meet  the SIRS or Sepsis criteria. No indication of appendicitis, bowel obstruction, bowel perforation, cholecystitis, diverticulitis, PID or ectopic pregnancy. Evaluation does not show pathology that would require ongoing emergent intervention or inpatient treatment. She is requesting prophylactic STD treatment - azithro and rocephin given. She reports having a PCP whom she can follow up with in the next week or so. I encouraged her to call tomorrow to schedule follow up appointment. Reasons to return to ER were discussed and also included in discharge summary. All questions answered.   Final Clinical Impressions(s) / ED Diagnoses   Final diagnoses:  Lower abdominal pain    ED Discharge Orders    None       Abigayle Wilinski, Chase Picket, PA-C 12/03/17 1839    Little, Ambrose Finland, MD 12/03/17 [phone]

## 2017-12-03 NOTE — ED Notes (Signed)
Patient has been having lower abdominal pain for the past week.  She reports nausea and on 01 December 2017 she took Zofran and it helped her but she came to the emergency room due to the pain and nausea not being resolved.  Abdominal pain is equal on both sides and yesterday it started radiating to the left flank.  Patient reports vaginal discharged was normal.

## 2017-12-04 LAB — GC/CHLAMYDIA PROBE AMP (~~LOC~~) NOT AT ARMC
Chlamydia: NEGATIVE
NEISSERIA GONORRHEA: NEGATIVE

## 2017-12-23 ENCOUNTER — Telehealth: Payer: Self-pay | Admitting: Family Medicine

## 2017-12-23 ENCOUNTER — Encounter: Payer: Self-pay | Admitting: *Deleted

## 2017-12-23 NOTE — Telephone Encounter (Signed)
Called patient and let her know that her letter was ready for pick up for her Mother Traci Castro to pick and sign for. Patient  Understood

## 2017-12-28 ENCOUNTER — Encounter (HOSPITAL_COMMUNITY): Payer: Self-pay | Admitting: *Deleted

## 2017-12-28 ENCOUNTER — Other Ambulatory Visit: Payer: Self-pay

## 2017-12-28 ENCOUNTER — Emergency Department (HOSPITAL_COMMUNITY)
Admission: EM | Admit: 2017-12-28 | Discharge: 2017-12-28 | Disposition: A | Discharge status: Home / Self Care | Payer: 59 | Attending: Emergency Medicine | Admitting: Emergency Medicine

## 2017-12-28 DIAGNOSIS — O9989 Other specified diseases and conditions complicating pregnancy, childbirth and the puerperium: Secondary | ICD-10-CM | POA: Insufficient documentation

## 2017-12-28 DIAGNOSIS — O219 Vomiting of pregnancy, unspecified: Secondary | ICD-10-CM | POA: Insufficient documentation

## 2017-12-28 DIAGNOSIS — Z3201 Encounter for pregnancy test, result positive: Secondary | ICD-10-CM | POA: Diagnosis not present

## 2017-12-28 DIAGNOSIS — R1033 Periumbilical pain: Secondary | ICD-10-CM | POA: Diagnosis not present

## 2017-12-28 DIAGNOSIS — Z3A00 Weeks of gestation of pregnancy not specified: Secondary | ICD-10-CM | POA: Diagnosis not present

## 2017-12-28 DIAGNOSIS — Z3491 Encounter for supervision of normal pregnancy, unspecified, first trimester: Secondary | ICD-10-CM

## 2017-12-28 LAB — URINALYSIS, ROUTINE W REFLEX MICROSCOPIC
BILIRUBIN URINE: NEGATIVE
Glucose, UA: NEGATIVE mg/dL
HGB URINE DIPSTICK: NEGATIVE
Ketones, ur: 20 mg/dL — AB
LEUKOCYTES UA: NEGATIVE
Nitrite: NEGATIVE
Protein, ur: 30 mg/dL — AB
SPECIFIC GRAVITY, URINE: 1.032 — AB (ref 1.005–1.030)
pH: 6 (ref 5.0–8.0)

## 2017-12-28 LAB — COMPREHENSIVE METABOLIC PANEL
ALT: 15 U/L (ref 14–54)
ANION GAP: 8 (ref 5–15)
AST: 16 U/L (ref 15–41)
Albumin: 3.7 g/dL (ref 3.5–5.0)
Alkaline Phosphatase: 69 U/L (ref 38–126)
BUN: 11 mg/dL (ref 6–20)
CHLORIDE: 106 mmol/L (ref 101–111)
CO2: 24 mmol/L (ref 22–32)
CREATININE: 0.81 mg/dL (ref 0.44–1.00)
Calcium: 8.8 mg/dL — ABNORMAL LOW (ref 8.9–10.3)
GFR calc non Af Amer: >60 mL/min (ref >60)
Glucose, Bld: 100 mg/dL — ABNORMAL HIGH (ref 65–99)
Potassium: 4 mmol/L (ref 3.5–5.1)
SODIUM: 138 mmol/L (ref 135–145)
Total Bilirubin: 0.3 mg/dL (ref 0.3–1.2)
Total Protein: 7.2 g/dL (ref 6.5–8.1)

## 2017-12-28 LAB — CBC
HCT: 37.1 % (ref 36.0–46.0)
HEMOGLOBIN: 11.5 g/dL — AB (ref 12.0–15.0)
MCH: 23.5 pg — AB (ref 26.0–34.0)
MCHC: 31 g/dL (ref 30.0–36.0)
MCV: 75.9 fL — AB (ref 78.0–100.0)
Platelets: 273 10*3/uL (ref 150–400)
RBC: 4.89 MIL/uL (ref 3.87–5.11)
RDW: 15.1 % (ref 11.5–15.5)
WBC: 11.8 10*3/uL — ABNORMAL HIGH (ref 4.0–10.5)

## 2017-12-28 LAB — I-STAT BETA HCG BLOOD, ED (MC, WL, AP ONLY)

## 2017-12-28 LAB — LIPASE, BLOOD: LIPASE: 51 U/L (ref 11–51)

## 2017-12-28 MED ORDER — ONDANSETRON 4 MG PO TBDP: 4.0000 mg | ORAL_TABLET | Freq: Three times a day (TID) | ORAL | 0 refills | Status: DC | PRN

## 2017-12-28 MED ORDER — ONDANSETRON 4 MG PO TBDP
8.0000 mg | ORAL_TABLET | Freq: Once | ORAL | Status: AC
  Administered 2017-12-28: 8 mg via ORAL
  Filled 2017-12-28: qty 2

## 2017-12-28 NOTE — ED Triage Notes (Signed)
The pt has had nausea and vomiting since last pm with abd pain  lmp April 30th

## 2017-12-28 NOTE — ED Provider Notes (Addendum)
[facility] EMERGENCY DEPARTMENT Provider Note   CSN: [id number] Arrival date & time: 12/28/17  1701     History   Chief Complaint Chief Complaint  Patient presents with  . Abdominal Pain    HPI Traci Castro is a 22 y.o. female with history of gastritis here for evaluation of sudden onset epigastric abdominal pain described as sharp and cramping that began while at work today.  Pain is been intermittent, moderate.  Associated with nausea and nonbilious nonbloody emesis, non bloody diarrhea.  Had Zofran which has stopped the nausea and vomiting, diarrhea is also slowing down.  She has no fevers, chest pain, shortness of breath, dysuria, frequency, urgency, abnormal vaginal discharge.  LMP April 23.  She does get regular menstrual cycles.  No sick contacts.  She is sexually active with men only.  Has been compliant with the birth control pills.  States that she recently switched to a lower dose birth control pill since having an elective abortion January 2019.  Takes occasional NSAIDs for menstrual cramps but not very often.  No  heavy EtOH, Goody powder, aspirin use.  History of suction D&C for retained products of conception on 09/20/2017 after elective abortion at outside facility. HPI  Past Medical History:  Diagnosis Date  . Anemia   . Anxiety    no meds  . Bipolar disorder (HCC)   . Depression    no meds  . Gastritis    2 bouts  . H/O seasonal allergies   . Migraine   . Psoriasis   . SVD (spontaneous vaginal delivery)    x 1  . Vision abnormalities    wears glasses    Patient Active Problem List   Diagnosis Date Noted  . Chlamydia 09/25/2017  . Gestation size to date discrepancy 04/16/2016  . BMI 45.0-49.9, adult (HCC) 03/15/2016    Past Surgical History:  Procedure Laterality Date  . DILATION AND EVACUATION N/A 09/20/2017   Procedure: SUCTION DILATATION AND EVACUATION;  Surgeon: [place] Bing, MD;  Location: [facility];  Service: Gynecology;   Laterality: N/A;  . ESOPHAGOGASTRODUODENOSCOPY N/A 10/17/2012   Procedure: ESOPHAGOGASTRODUODENOSCOPY (EGD);  Surgeon: Jon Gills, MD;  Location: [facility] OR;  Service: Gastroenterology;  Laterality: N/A;  . THERAPEUTIC ABORTION  09/16/2017   Clinic  . WISDOM TOOTH EXTRACTION       OB History    Gravida  2   Para  1   Term  1   Preterm      AB  1   Living  1     SAB      TAB  1   Ectopic      Multiple  0   Live Births  1            Home Medications    Prior to Admission medications   Medication Sig Start Date End Date Taking? Authorizing Provider  cetirizine (ZYRTEC ALLERGY) 10 MG tablet Take 1 tablet (10 mg total) by mouth daily. Patient taking differently: Take 10 mg by mouth daily as needed for allergies.  12/29/16  Yes Everlene Farrier, PA-C  fluticasone (FLONASE) 50 MCG/ACT nasal spray Place 2 sprays into both nostrils daily. Patient taking differently: Place 2 sprays into both nostrils daily as needed for allergies or rhinitis.  07/24/17  Yes Dansie, Chrissie Noa, PA-C  LO LOESTRIN FE 1 MG-10 MCG / 10 MCG tablet Take 1 tablet by mouth daily. Start this after you finish your current pack of pills 10/16/17  Yes [place] Bing, MD  montelukast (SINGULAIR) 10 MG tablet Take 1 tablet (10 mg total) by mouth at bedtime. Patient taking differently: Take 10 mg by mouth at bedtime as needed (allergies).  02/21/16  Yes Kozlow, Alvira Philips, MD  ondansetron (ZOFRAN ODT) 4 MG disintegrating tablet Take 1 tablet (4 mg total) by mouth every 8 (eight) hours as needed for nausea or vomiting. 12/28/17   Liberty Handy, PA-C    Family History Family History  Problem Relation Age of Onset  . Cholelithiasis Mother   . Hyperlipidemia Mother   . Hypertension Mother   . Mental illness Mother   . Depression Mother   . Cholelithiasis Maternal Uncle   . Hyperlipidemia Maternal Uncle   . Hypertension Maternal Uncle   . Cholelithiasis Maternal Grandmother   . Arthritis Maternal Grandmother    . COPD Maternal Grandmother   . Depression Maternal Grandmother   . Diabetes Maternal Grandmother   . Heart disease Maternal Grandmother   . Hyperlipidemia Maternal Grandmother   . Hypertension Maternal Grandmother   . Learning disabilities Paternal Grandmother   . Mental illness Paternal Grandmother   . Hyperlipidemia Paternal Grandfather   . Diabetes Paternal Grandfather   . Ulcers Neg Hx   . Alcohol abuse Neg Hx   . Asthma Neg Hx   . Birth defects Neg Hx   . Cancer Neg Hx   . Drug abuse Neg Hx   . Early death Neg Hx   . Hearing loss Neg Hx   . Kidney disease Neg Hx   . Mental retardation Neg Hx   . Miscarriages / Stillbirths Neg Hx   . Stroke Neg Hx   . Vision loss Neg Hx     Social History Social History   Tobacco Use  . Smoking status: Never Smoker  . Smokeless tobacco: Never Used  Substance Use Topics  . Alcohol use: No  . Drug use: No     Allergies   Chlorhexidine and Tramadol   Review of Systems Review of Systems  Gastrointestinal: Positive for abdominal pain, diarrhea, nausea and vomiting.  All other systems reviewed and are negative.    Physical Exam Updated Vital Signs BP 119/60   Pulse 83   Temp 98.1 F (36.7 C)   Resp 20   Ht  (1.6 m)   Wt 131.5 kg (290 lb)   LMP 12/24/2017   SpO2 100%   BMI 51.37 kg/m   Physical Exam  Constitutional: She is oriented to person, place, and time. She appears well-developed and well-nourished. No distress.  Non toxic  HENT:  Head: Normocephalic and atraumatic.  Nose: Nose normal.  Moist mucous membranes   Eyes: Pupils are equal, round, and reactive to light. Conjunctivae and EOM are normal.  Neck: Normal range of motion.  Cardiovascular: Normal rate and regular rhythm.  Pulmonary/Chest: Effort normal and breath sounds normal.  Abdominal: Soft. Bowel sounds are normal. There is tenderness in the epigastric area.  No G/R/R. No suprapubic or CVA tenderness. Negative Murphy's and McBurney's.     Musculoskeletal: Normal range of motion.  Neurological: She is alert and oriented to person, place, and time.  Skin: Skin is warm and dry. Capillary refill takes less than 2 seconds.  Psychiatric: She has a normal mood and affect. Her behavior is normal.  Nursing note and vitals reviewed.    ED Treatments / Results  Labs (all labs ordered are listed, but only abnormal results are displayed) Labs Reviewed  COMPREHENSIVE  METABOLIC PANEL - Abnormal; Notable for the following components:      Result Value   Glucose, Bld 100 (*)    Calcium 8.8 (*)    All other components within normal limits  CBC - Abnormal; Notable for the following components:   WBC 11.8 (*)    Hemoglobin 11.5 (*)    MCV 75.9 (*)    MCH 23.5 (*)    All other components within normal limits  URINALYSIS, ROUTINE W REFLEX MICROSCOPIC - Abnormal; Notable for the following components:   Color, Urine AMBER (*)    APPearance CLOUDY (*)    Specific Gravity, Urine 1.032 (*)    Ketones, ur 20 (*)    Protein, ur 30 (*)    Bacteria, UA RARE (*)    All other components within normal limits  I-STAT BETA HCG BLOOD, ED ([facility], [facility], [facility] ONLY) - Abnormal; Notable for the following components:   I-stat hCG, quantitative >2,000.0 (*)    All other components within normal limits  URINE CULTURE  LIPASE, BLOOD    EKG None  Radiology No results found.  Procedures Procedures (including critical care time)  Medications Ordered in ED Medications  ondansetron (ZOFRAN-ODT) disintegrating tablet 8 mg (8 mg Oral Given 12/28/17 1731)     Initial Impression / Assessment and Plan / ED Course  I have reviewed the triage vital signs and the nursing notes.  Pertinent labs & imaging results that were available during my care of the patient were reviewed by me and considered in my medical decision making (see chart for details).  Clinical Course as of Dec 30 11  Sat Dec 28, 2017  2021 WBC(!): 11.8 [CG]  2021 I-stat hCG,  quantitative(!): >2,000.0 [CG]  2022 WBC, UA: 6-10 [CG]  2022 Bacteria, UA(!): RARE [CG]  2022 Squamous Epithelial / LPF: 21-50 [CG]    Clinical Course User Index [CG] Liberty Handy, PA-C   22 year old here with sudden onset nausea, vomiting, diarrhea, periumbilical abdominal pain.  Symptoms have resolved in the ER.  She is tolerating ginger ale. No fevers.  Exam, as above reassuring.  Vital signs WNL.  She has mild to, epigastric, periumbilical tenderness.  Lab work reviewed, remarkable for positive hCG.  Urinalysis not convincing of infection, will send for culture, she does not have any urinary symptoms.  WBC 11.8.  Given exam, doubt cholecystitis, pancreatitis, appendicitis, SBO, diverticulitis, dissection.  Final Clinical Impressions(s) / ED Diagnoses   Given symptomatology, benign exam and work up suspect viral illness.  Patient was notified of positive pregnancy test.  She is denying any pelvic pain, abnormal vaginal discharge, urinary symptoms, vaginal bleeding.  LMP 12/17/2017.  In regards to positive pregnancy test, she has no red flags that would warrant further emergent lab work or imaging such as ultrasound.  Will discharge with close follow-up, she has PCP appointment in 4 days.  She is to discuss first trimester pregnancy with PCP.  Discussed return precautions.  Patient and mother agreeable with this plan. Final diagnoses:  First trimester pregnancy  Periumbilical abdominal pain    ED Discharge Orders        Ordered    ondansetron (ZOFRAN ODT) 4 MG disintegrating tablet  Every 8 hours PRN     12/28/17 2155       Liberty Handy, PA-C 12/29/17 0013    Arby Barrette, MD 12/29/17 0044    Liberty Handy, PA-C 01/06/18 2220    Arby Barrette, MD 01/09/18 1520

## 2017-12-28 NOTE — Discharge Instructions (Addendum)
Pregnancy test is POSITIVE today. We have obtained hormone levels to track your pregnancy.  Your lab work was reassuring.  Urine does not look infected however we have sent a culture to confirm that there is no urinary tract infection.  I suspect her nausea, vomiting, diarrhea, abdominal pain may have been from a virus or something you ate.  Stay hydrated.  Return for worsening abdominal pain, fevers, vaginal bleeding, abnormal vaginal discharge or pelvic pain.  Follow-up with your primary care doctor in 4 days as scheduled for further discussion of first trimester pregnancy.

## 2017-12-30 LAB — URINE CULTURE

## 2017-12-31 ENCOUNTER — Other Ambulatory Visit: Payer: Self-pay

## 2017-12-31 ENCOUNTER — Encounter (HOSPITAL_COMMUNITY): Payer: Self-pay

## 2017-12-31 ENCOUNTER — Ambulatory Visit (HOSPITAL_COMMUNITY)
Admission: EM | Admit: 2017-12-31 | Discharge: 2017-12-31 | Disposition: A | Discharge status: Home / Self Care | Payer: 59 | Attending: Family Medicine | Admitting: Family Medicine

## 2017-12-31 DIAGNOSIS — Z872 Personal history of diseases of the skin and subcutaneous tissue: Secondary | ICD-10-CM

## 2017-12-31 DIAGNOSIS — L509 Urticaria, unspecified: Secondary | ICD-10-CM

## 2017-12-31 DIAGNOSIS — L500 Allergic urticaria: Secondary | ICD-10-CM

## 2017-12-31 MED ORDER — METHYLPREDNISOLONE ACETATE 80 MG/ML IJ SUSP
80.0000 mg | Freq: Once | INTRAMUSCULAR | Status: AC
  Administered 2017-12-31: 80 mg via INTRAMUSCULAR

## 2017-12-31 MED ORDER — METHYLPREDNISOLONE ACETATE 80 MG/ML IJ SUSP
INTRAMUSCULAR | Status: AC
  Filled 2017-12-31: qty 1

## 2017-12-31 NOTE — Discharge Instructions (Addendum)
Remember that cool compresses and cool air is soothing  Aveeno oatmeal is also soothing.

## 2017-12-31 NOTE — ED Provider Notes (Signed)
[facility]ENTER   [id number] 12/31/17 Arrival Time: 1653   SUBJECTIVE:  Traci Castro is a 22 y.o. female who presents to the urgent care with complaint of possible allergic reaction since today. Pt is [redacted] weeks pregnant and has taken benadryl to treat reaction   Patient is [redacted] weeks pregnant and has a long h/o guttate psoriasis.  No cough or fever.  No sore throat.     Past Medical History:  Diagnosis Date  . Anemia   . Anxiety    no meds  . Bipolar disorder (HCC)   . Depression    no meds  . Gastritis    2 bouts  . H/O seasonal allergies   . Migraine   . Psoriasis   . SVD (spontaneous vaginal delivery)    x 1  . Vision abnormalities    wears glasses   Family History  Problem Relation Age of Onset  . Cholelithiasis Mother   . Hyperlipidemia Mother   . Hypertension Mother   . Mental illness Mother   . Depression Mother   . Cholelithiasis Maternal Uncle   . Hyperlipidemia Maternal Uncle   . Hypertension Maternal Uncle   . Cholelithiasis Maternal Grandmother   . Arthritis Maternal Grandmother   . COPD Maternal Grandmother   . Depression Maternal Grandmother   . Diabetes Maternal Grandmother   . Heart disease Maternal Grandmother   . Hyperlipidemia Maternal Grandmother   . Hypertension Maternal Grandmother   . Learning disabilities Paternal Grandmother   . Mental illness Paternal Grandmother   . Hyperlipidemia Paternal Grandfather   . Diabetes Paternal Grandfather   . Ulcers Neg Hx   . Alcohol abuse Neg Hx   . Asthma Neg Hx   . Birth defects Neg Hx   . Cancer Neg Hx   . Drug abuse Neg Hx   . Early death Neg Hx   . Hearing loss Neg Hx   . Kidney disease Neg Hx   . Mental retardation Neg Hx   . Miscarriages / Stillbirths Neg Hx   . Stroke Neg Hx   . Vision loss Neg Hx    Social History   Socioeconomic History  . Marital status: Single    Spouse name: Not on file  . Number of children: Not on file  . Years of education: Not on file  . Highest  education level: Not on file  Occupational History  . Not on file  Social Needs  . Financial resource strain: Not on file  . Food insecurity:    Worry: Not on file    Inability: Not on file  . Transportation needs:    Medical: Not on file    Non-medical: Not on file  Tobacco Use  . Smoking status: Never Smoker  . Smokeless tobacco: Never Used  Substance and Sexual Activity  . Alcohol use: No  . Drug use: No  . Sexual activity: Yes    Birth control/protection: None    Comment: therapeutic abortion 09/16/17  Lifestyle  . Physical activity:    Days per week: Not on file    Minutes per session: Not on file  . Stress: Not on file  Relationships  . Social connections:    Talks on phone: Not on file    Gets together: Not on file    Attends religious service: Not on file    Active member of club or organization: Not on file    Attends meetings of clubs or organizations: Not on file  Relationship status: Not on file  . Intimate partner violence:    Fear of current or ex partner: Not on file    Emotionally abused: Not on file    Physically abused: Not on file    Forced sexual activity: Not on file  Other Topics Concern  . Not on file  Social History Narrative   10th grade   No outpatient medications have been marked as taking for the 12/31/17 encounter [facility] Encounter).   Allergies  Allergen Reactions  . Chlorhexidine Itching and Rash    Severe itching, redness.   . Tramadol Nausea And Vomiting      ROS: As per HPI, remainder of ROS negative.   OBJECTIVE:   Vitals:   12/31/17 1712 12/31/17 1714  BP: 110/74   Pulse: 81   Resp: 17   Temp: 98.5 F (36.9 C)   TempSrc: Oral   SpO2: 100% 100%     General appearance: alert; no distress but itching constantly Eyes: PERRL; EOMI; conjunctiva normal HENT: normocephalic; atraumatic;  oral mucosa normal Neck: supple Lungs: clear to auscultation bilaterally Heart: regular rate and rhythm Back: no CVA  tenderness Extremities: no cyanosis or edema; symmetrical with no gross deformities Skin: warm and dry;  Diffuse guttate psoriasis over entire body including scalp Neurologic: normal gait; grossly normal Psychological: alert and cooperative; normal mood and affect      Labs:  Results for orders placed or performed during the hospital encounter of 10/10/12  Clotest (H. pylori), biopsy  Result Value Ref Range   Helicobacter screen UREASE NEGATIVE UREASE NEGATIVE    Labs Reviewed - No data to display  No results found.     ASSESSMENT & PLAN:  1. Hives     Meds ordered this encounter  Medications  . methylPREDNISolone acetate (DEPO-MEDROL) injection 80 mg    Reviewed expectations re: course of current medical issues. Questions answered. Outlined signs and symptoms indicating need for more acute intervention. Patient verbalized understanding. After Visit Summary given.    Procedures:      Elvina Sidle, MD 12/31/17 1739

## 2017-12-31 NOTE — ED Triage Notes (Signed)
Patient presents to [facility] for possible allergic reaction since today. Pt is [redacted] weeks pregnant and has taken benadryl to treat reaction

## 2018-01-01 ENCOUNTER — Ambulatory Visit: Payer: 59 | Admitting: Family Medicine

## 2018-01-08 NOTE — ED Notes (Signed)
01/08/2018, Pt. Called for results of her pregnancy test .  Results reviewed and all questions answered.

## 2018-01-09 ENCOUNTER — Encounter

## 2018-01-09 ENCOUNTER — Ambulatory Visit: Payer: 59 | Admitting: Nurse Practitioner

## 2018-01-13 ENCOUNTER — Ambulatory Visit: Payer: 59 | Admitting: Family Medicine

## 2018-01-13 DIAGNOSIS — Z0289 Encounter for other administrative examinations: Secondary | ICD-10-CM

## 2018-01-14 ENCOUNTER — Encounter (HOSPITAL_COMMUNITY): Payer: Self-pay | Admitting: *Deleted

## 2018-01-14 ENCOUNTER — Inpatient Hospital Stay (HOSPITAL_COMMUNITY)
Admission: AD | Admit: 2018-01-14 | Discharge: 2018-01-14 | Disposition: A | Discharge status: Home / Self Care | Payer: 59 | Source: Ambulatory Visit | Attending: Obstetrics and Gynecology | Admitting: Obstetrics and Gynecology

## 2018-01-14 DIAGNOSIS — Z3A09 9 weeks gestation of pregnancy: Secondary | ICD-10-CM | POA: Diagnosis not present

## 2018-01-14 DIAGNOSIS — O2341 Unspecified infection of urinary tract in pregnancy, first trimester: Secondary | ICD-10-CM | POA: Diagnosis not present

## 2018-01-14 DIAGNOSIS — R109 Unspecified abdominal pain: Secondary | ICD-10-CM

## 2018-01-14 DIAGNOSIS — Z818 Family history of other mental and behavioral disorders: Secondary | ICD-10-CM | POA: Diagnosis not present

## 2018-01-14 DIAGNOSIS — O23591 Infection of other part of genital tract in pregnancy, first trimester: Secondary | ICD-10-CM | POA: Insufficient documentation

## 2018-01-14 DIAGNOSIS — O26891 Other specified pregnancy related conditions, first trimester: Secondary | ICD-10-CM

## 2018-01-14 DIAGNOSIS — N760 Acute vaginitis: Secondary | ICD-10-CM

## 2018-01-14 DIAGNOSIS — Z888 Allergy status to other drugs, medicaments and biological substances status: Secondary | ICD-10-CM | POA: Insufficient documentation

## 2018-01-14 DIAGNOSIS — Z3491 Encounter for supervision of normal pregnancy, unspecified, first trimester: Secondary | ICD-10-CM

## 2018-01-14 DIAGNOSIS — Z8249 Family history of ischemic heart disease and other diseases of the circulatory system: Secondary | ICD-10-CM | POA: Diagnosis not present

## 2018-01-14 DIAGNOSIS — B9689 Other specified bacterial agents as the cause of diseases classified elsewhere: Secondary | ICD-10-CM

## 2018-01-14 LAB — URINALYSIS, ROUTINE W REFLEX MICROSCOPIC
BILIRUBIN URINE: NEGATIVE
Glucose, UA: NEGATIVE mg/dL
KETONES UR: NEGATIVE mg/dL
NITRITE: NEGATIVE
PH: 5 (ref 5.0–8.0)
Protein, ur: NEGATIVE mg/dL
SPECIFIC GRAVITY, URINE: 1.021 (ref 1.005–1.030)
Squamous Epithelial / LPF: >50 — ABNORMAL HIGH (ref 0–5)

## 2018-01-14 LAB — WET PREP, GENITAL
SPERM: NONE SEEN
TRICH WET PREP: NONE SEEN
Yeast Wet Prep HPF POC: NONE SEEN

## 2018-01-14 MED ORDER — CEPHALEXIN 500 MG PO CAPS: 500.0000 mg | ORAL_CAPSULE | Freq: Four times a day (QID) | ORAL | 0 refills | Status: DC

## 2018-01-14 MED ORDER — METRONIDAZOLE 0.75 % VA GEL: 1.0000 | Freq: Two times a day (BID) | VAGINAL | 0 refills | Status: DC

## 2018-01-14 NOTE — MAU Provider Note (Signed)
History     CSN: [id number]  Arrival date and time: 01/14/18 0757   First Provider Initiated Contact with Patient 01/14/18 0831      Chief Complaint  Patient presents with  . Vaginal Bleeding  . Abdominal Pain   HPI  Traci Castro is a 22 y.o. G3P1011 at [redacted]w[redacted]d by LMP who presents with abdominal cramping and vaginal bleeding. Symptoms began this morning. Reports intermittent lower abdominal cramping. Rates pain 7/10. Has not treated symptoms. Nothing makes better or worse. Saw pink/red spotting on toilet paper this morning. Has not seen bleeding since then. Denies n/v/d, vaginal discharge, or recent intercourse. Endorses some dysuria and increased urinary frequency. Treated for chlamydia in January. States is with new partner now & had negative testing in April.  Had blood work done in the office [facility] ob/g[facility]) -- scheduled to see Dr. Cherly Hensen next week but has never been seen before (per Dr. Cherly Hensen, pt to be seen by faculty for today's visit).    OB History    Gravida  3   Para  1   Term  1   Preterm      AB  1   Living  1     SAB      TAB  1   Ectopic      Multiple  0   Live Births  1           Past Medical History:  Diagnosis Date  . Anemia   . Anxiety    no meds  . Bipolar disorder (HCC)   . Depression    no meds  . Gastritis    2 bouts  . H/O seasonal allergies   . Migraine   . Psoriasis   . SVD (spontaneous vaginal delivery)    x 1  . Vision abnormalities    wears glasses    Past Surgical History:  Procedure Laterality Date  . DILATION AND EVACUATION N/A 09/20/2017   Procedure: SUCTION DILATATION AND EVACUATION;  Surgeon: Metcalfe Bing, MD;  Location: [facility] ORS;  Service: Gynecology;  Laterality: N/A;  . ESOPHAGOGASTRODUODENOSCOPY N/A 10/17/2012   Procedure: ESOPHAGOGASTRODUODENOSCOPY (EGD);  Surgeon: Jon Gills, MD;  Location: [facility] OR;  Service: Gastroenterology;  Laterality: N/A;  . THERAPEUTIC ABORTION  09/16/2017   Clinic  .  WISDOM TOOTH EXTRACTION      Family History  Problem Relation Age of Onset  . Cholelithiasis Mother   . Hyperlipidemia Mother   . Hypertension Mother   . Mental illness Mother   . Depression Mother   . Cholelithiasis Maternal Uncle   . Hyperlipidemia Maternal Uncle   . Hypertension Maternal Uncle   . Cholelithiasis Maternal Grandmother   . Arthritis Maternal Grandmother   . COPD Maternal Grandmother   . Depression Maternal Grandmother   . Diabetes Maternal Grandmother   . Heart disease Maternal Grandmother   . Hyperlipidemia Maternal Grandmother   . Hypertension Maternal Grandmother   . Learning disabilities Paternal Grandmother   . Mental illness Paternal Grandmother   . Hyperlipidemia Paternal Grandfather   . Diabetes Paternal Grandfather   . Ulcers Neg Hx   . Alcohol abuse Neg Hx   . Asthma Neg Hx   . Birth defects Neg Hx   . Cancer Neg Hx   . Drug abuse Neg Hx   . Early death Neg Hx   . Hearing loss Neg Hx   . Kidney disease Neg Hx   . Mental retardation Neg Hx   .  Miscarriages / Stillbirths Neg Hx   . Stroke Neg Hx   . Vision loss Neg Hx     Social History   Tobacco Use  . Smoking status: Never Smoker  . Smokeless tobacco: Never Used  Substance Use Topics  . Alcohol use: No  . Drug use: No    Allergies:  Allergies  Allergen Reactions  . Chlorhexidine Itching and Rash    Severe itching, redness.   . Tramadol Nausea And Vomiting    Medications Prior to Admission  Medication Sig Dispense Refill Last Dose  . LO LOESTRIN FE 1 MG-10 MCG / 10 MCG tablet Take 1 tablet by mouth daily. Start this after you finish your current pack of pills 3 Package 3 Unknown at Unknown time  . ondansetron (ZOFRAN ODT) 4 MG disintegrating tablet Take 1 tablet (4 mg total) by mouth every 8 (eight) hours as needed for nausea or vomiting. 20 tablet 0 Unknown at Unknown time    Review of Systems  Constitutional: Negative.   Gastrointestinal: Positive for abdominal pain.  Negative for constipation, diarrhea, nausea and vomiting.  Genitourinary: Positive for dysuria, frequency and vaginal bleeding. Negative for flank pain, hematuria and vaginal discharge.   Physical Exam   Blood pressure 123/62, pulse 81, temperature 98.5 F (36.9 C), temperature source Oral, resp. rate 18, height  (1.6 m), weight 285 lb 12 oz (129.6 kg), last menstrual period 10/19/2017, not currently breastfeeding.  Physical Exam  Nursing note and vitals reviewed. Constitutional: She is oriented to person, place, and time. She appears well-developed and well-nourished. No distress.  HENT:  Head: Normocephalic and atraumatic.  Eyes: Conjunctivae are normal. Right eye exhibits no discharge. Left eye exhibits no discharge. No scleral icterus.  Neck: Normal range of motion.  Respiratory: Effort normal. No respiratory distress.  GI: Soft. There is no tenderness.  Genitourinary: Cervix exhibits no friability. No bleeding in the vagina. Vaginal discharge found.  Genitourinary Comments: Moderate amount of grey thin frothy discharge. No blood. Cervix visually closed.  Neurological: She is alert and oriented to person, place, and time.  Skin: Skin is warm and dry. She is not diaphoretic.  Psychiatric: She has a normal mood and affect. Her behavior is normal. Judgment and thought content normal.    MAU Course  Procedures Results for orders placed or performed during the hospital encounter of 01/14/18 (from the past 24 hour(s))  Urinalysis, Routine w reflex microscopic     Status: Abnormal   Collection Time: 01/14/18  8:02 AM  Result Value Ref Range   Color, Urine YELLOW YELLOW   APPearance CLOUDY (A) CLEAR   Specific Gravity, Urine 1.021 1.005 - 1.030   pH 5.0 5.0 - 8.0   Glucose, UA NEGATIVE NEGATIVE mg/dL   Hgb urine dipstick SMALL (A) NEGATIVE   Bilirubin Urine NEGATIVE NEGATIVE   Ketones, ur NEGATIVE NEGATIVE mg/dL   Protein, ur NEGATIVE NEGATIVE mg/dL   Nitrite NEGATIVE NEGATIVE    Leukocytes, UA SMALL (A) NEGATIVE   RBC / HPF 0-5 0 - 5 RBC/hpf   WBC, UA 6-10 0 - 5 WBC/hpf   Bacteria, UA RARE (A) NONE SEEN   Squamous Epithelial / LPF >50 (H) 0 - 5  Wet prep, genital     Status: Abnormal   Collection Time: 01/14/18  9:05 AM  Result Value Ref Range   Yeast Wet Prep HPF POC NONE SEEN NONE SEEN   Trich, Wet Prep NONE SEEN NONE SEEN   Clue Cells Wet Prep HPF  POC PRESENT (A) NONE SEEN   WBC, Wet Prep HPF POC FEW (A) NONE SEEN   Sperm NONE SEEN     MDM RN unable to doppler FHT  Pt informed that the ultrasound is considered a limited OB ultrasound and is not intended to be a complete ultrasound exam.  Patient also informed that the ultrasound is not being completed with the intent of assessing for fetal or placental anomalies or any pelvic abnormalities.  Explained that the purpose of today's ultrasound is to assess for  viability.  Patient acknowledges the purpose of the exam and the limitations of the study.   IUP with cardia activity, 170bpm; CRL puts her at [redacted]w[redacted]d.  A positive  U/a with + leuks & WBCs; although looks like contaminated specimen with squamous. Due to patient's complaint of dysuria & with her being pregnant will tx for UTI & send urine for culture.   Assessment and Plan  A:  1. Normal IUP (intrauterine pregnancy) on prenatal ultrasound, first trimester   2. [redacted] weeks gestation of pregnancy   3. Urinary tract infection in mother during first trimester of pregnancy   4. BV (bacterial vaginosis)    P: Discharge home Rx metrogel & keflex Urine culture & GC/CT pending F/u with OB  Discussed reasons to return to MAU  Judeth Horn 01/14/2018, 8:31 AM

## 2018-01-14 NOTE — Discharge Instructions (Signed)
Bacterial Vaginosis Bacterial vaginosis is a vaginal infection that occurs when the normal balance of bacteria in the vagina is disrupted. It results from an overgrowth of certain bacteria. This is the most common vaginal infection among women ages 7-44. Because bacterial vaginosis increases your risk for STIs (sexually transmitted infections), getting treated can help reduce your risk for chlamydia, gonorrhea, herpes, and HIV (human immunodeficiency virus). Treatment is also important for preventing complications in pregnant women, because this condition can cause an early (premature) delivery. What are the causes? This condition is caused by an increase in harmful bacteria that are normally present in small amounts in the vagina. However, the reason that the condition develops is not fully understood. What increases the risk? The following factors may make you more likely to develop this condition:  Having a new sexual partner or multiple sexual partners.  Having unprotected sex.  Douching.  Having an intrauterine device (IUD).  Smoking.  Drug and alcohol abuse.  Taking certain antibiotic medicines.  Being pregnant.  You cannot get bacterial vaginosis from toilet seats, bedding, swimming pools, or contact with objects around you. What are the signs or symptoms? Symptoms of this condition include:  Grey or white vaginal discharge. The discharge can also be watery or foamy.  A fish-like odor with discharge, especially after sexual intercourse or during menstruation.  Itching in and around the vagina.  Burning or pain with urination.  Some women with bacterial vaginosis have no signs or symptoms. How is this diagnosed? This condition is diagnosed based on:  Your medical history.  A physical exam of the vagina.  Testing a sample of vaginal fluid under a microscope to look for a large amount of bad bacteria or abnormal cells. Your health care provider may use a cotton swab  or a small wooden spatula to collect the sample.  How is this treated? This condition is treated with antibiotics. These may be given as a pill, a vaginal cream, or a medicine that is put into the vagina (suppository). If the condition comes back after treatment, a second round of antibiotics may be needed. Follow these instructions at home: Medicines  Take over-the-counter and prescription medicines only as told by your health care provider.  Take or use your antibiotic as told by your health care provider. Do not stop taking or using the antibiotic even if you start to feel better. General instructions  If you have a female sexual partner, tell her that you have a vaginal infection. She should see her health care provider and be treated if she has symptoms. If you have a female sexual partner, he does not need treatment.  During treatment: ? Avoid sexual activity until you finish treatment. ? Do not douche. ? Avoid alcohol as directed by your health care provider. ? Avoid breastfeeding as directed by your health care provider.  Drink enough water and fluids to keep your urine clear or pale yellow.  Keep the area around your vagina and rectum clean. ? Wash the area daily with warm water. ? Wipe yourself from front to back after using the toilet.  Keep all follow-up visits as told by your health care provider. This is important. How is this prevented?  Do not douche.  Wash the outside of your vagina with warm water only.  Use protection when having sex. This includes latex condoms and dental dams.  Limit how many sexual partners you have. To help prevent bacterial vaginosis, it is best to have sex with just  one partner (monogamous).  Make sure you and your sexual partner are tested for STIs.  Wear cotton or cotton-lined underwear.  Avoid wearing tight pants and pantyhose, especially during summer.  Limit the amount of alcohol that you drink.  Do not use any products that  contain nicotine or tobacco, such as cigarettes and e-cigarettes. If you need help quitting, ask your health care provider.  Do not use illegal drugs. Where to find more information:  Centers for Disease Control and Prevention: SolutionApps.co.za  American Sexual Health Association (ASHA): www.ashastd.org  U.S. Department of Health and Health and safety inspector, Office on Women's Health: ConventionalMedicines.si or http://www.anderson-williamson.info/ Contact a health care provider if:  Your symptoms do not improve, even after treatment.  You have more discharge or pain when urinating.  You have a fever.  You have pain in your abdomen.  You have pain during sex.  You have vaginal bleeding between periods. Summary  Bacterial vaginosis is a vaginal infection that occurs when the normal balance of bacteria in the vagina is disrupted.  Because bacterial vaginosis increases your risk for STIs (sexually transmitted infections), getting treated can help reduce your risk for chlamydia, gonorrhea, herpes, and HIV (human immunodeficiency virus). Treatment is also important for preventing complications in pregnant women, because the condition can cause an early (premature) delivery.  This condition is treated with antibiotic medicines. These may be given as a pill, a vaginal cream, or a medicine that is put into the vagina (suppository). This information is not intended to replace advice given to you by your health care provider. Make sure you discuss any questions you have with your health care provider. Document Released: 08/13/2005 Document Revised: 12/17/2016 Document Reviewed: 04/28/2016 Elsevier Interactive Patient Education  2018 [employer].  Pregnancy and Urinary Tract Infection What is a urinary tract infection? A urinary tract infection (UTI) is an infection of any part of the urinary tract. This includes the kidneys, the tubes that connect your kidneys to your bladder  (ureters), the bladder, and the tube that carries urine out of your body (urethra). These organs make, store, and get rid of urine in the body. A UTI can be a bladder infection (cystitis) or a kidney infection (pyelonephritis). This infection may be caused by fungi, viruses, and bacteria. Bacteria are the most common cause of UTIs. You are more likely to develop a UTI during pregnancy because:  The physical and hormonal changes your body goes through can make it easier for bacteria to get into your urinary tract.  Your growing baby puts pressure on your uterus and can affect urine flow.  Does a UTI place my baby at risk? An untreated UTI during pregnancy could lead to a kidney infection, which can cause health problems that could affect your baby. Possible complications of an untreated UTI include:  Having your baby before 37 weeks of pregnancy (premature).  Having a baby with a low birth weight.  Developing high blood pressure during pregnancy (preeclampsia).  What are the symptoms of a UTI? Symptoms of a UTI include:  Fever.  Frequent urination or passing small amounts of urine frequently.  Needing to urinate urgently.  Pain or a burning sensation with urination.  Urine that smells bad or unusual.  Cloudy urine.  Pain in the lower abdomen or back.  Trouble urinating.  Blood in the urine.  Vomiting or being less hungry than normal.  Diarrhea or abdominal pain.  Vaginal discharge.  What are the treatment options for a UTI during pregnancy? Treatment  for this condition may include:  Antibiotic medicines that are safe to take during pregnancy.  Other medicines to treat less common causes of UTI.  How can I prevent a UTI?  To prevent a UTI:  Go to the bathroom as soon as you feel the need.  Always wipe from front to back.  Wash your genital area with soap and warm water daily.  Empty your bladder before and after sex.  Wear cotton underwear.  Limit your  intake of high sugar foods or drinks, such as regular soda, juice, and sweets.  Drink 6-8 glasses of water daily.  Do not wear tight-fitting pants.  Do not douche or use deodorant sprays.  Do not drink alcohol, caffeine, or carbonated drinks. These can irritate the bladder.  Contact a health care provider if:  Your symptoms do not improve or get worse.  You have a fever after two days of treatment.  You have a rash.  You have abnormal vaginal discharge.  You have back or side pain.  You have chills.  You have nausea and vomiting. Get help right away if: Seek immediate medical care if you are pregnant and:  You feel contractions in your uterus.  You have lower belly pain.  You have a gush of fluid from your vagina.  You have blood in your urine.  You are vomiting and cannot keep down any medicines or water.  This information is not intended to replace advice given to you by your health care provider. Make sure you discuss any questions you have with your health care provider. Document Released: 12/08/2010 Document Revised: 07/27/2016 Document Reviewed: 07/04/2015 Elsevier Interactive Patient Education  2017 [employer].

## 2018-01-14 NOTE — MAU Note (Signed)
Abdominal pain and some bleeding since this AM around 0600, bleeding is just when she wipes. No recent IC, reports no discharge.

## 2018-01-15 ENCOUNTER — Encounter: Payer: Self-pay | Admitting: *Deleted

## 2018-01-15 LAB — CULTURE, OB URINE: Culture: NO GROWTH

## 2018-01-15 LAB — GC/CHLAMYDIA PROBE AMP (~~LOC~~) NOT AT ARMC
CHLAMYDIA, DNA PROBE: NEGATIVE
Neisseria Gonorrhea: NEGATIVE

## 2018-01-22 ENCOUNTER — Ambulatory Visit: Payer: 59 | Admitting: Family Medicine

## 2018-01-27 DIAGNOSIS — Z3480 Encounter for supervision of other normal pregnancy, unspecified trimester: Secondary | ICD-10-CM | POA: Insufficient documentation

## 2018-01-28 ENCOUNTER — Other Ambulatory Visit: Payer: Self-pay

## 2018-01-28 ENCOUNTER — Ambulatory Visit (INDEPENDENT_AMBULATORY_CARE_PROVIDER_SITE_OTHER): Payer: 59 | Admitting: Certified Nurse Midwife

## 2018-01-28 ENCOUNTER — Other Ambulatory Visit (HOSPITAL_COMMUNITY)
Admission: RE | Admit: 2018-01-28 | Discharge: 2018-01-28 | Disposition: A | Discharge status: Home / Self Care | Payer: 59 | Source: Ambulatory Visit | Attending: Certified Nurse Midwife | Admitting: Certified Nurse Midwife

## 2018-01-28 ENCOUNTER — Encounter: Payer: Self-pay | Admitting: Certified Nurse Midwife

## 2018-01-28 VITALS — BP 116/79 | HR 86 | Wt 280.4 lb

## 2018-01-28 DIAGNOSIS — B3731 Acute candidiasis of vulva and vagina: Secondary | ICD-10-CM

## 2018-01-28 DIAGNOSIS — Z3482 Encounter for supervision of other normal pregnancy, second trimester: Secondary | ICD-10-CM | POA: Diagnosis not present

## 2018-01-28 DIAGNOSIS — Z3480 Encounter for supervision of other normal pregnancy, unspecified trimester: Secondary | ICD-10-CM | POA: Diagnosis present

## 2018-01-28 DIAGNOSIS — O219 Vomiting of pregnancy, unspecified: Secondary | ICD-10-CM

## 2018-01-28 DIAGNOSIS — B373 Candidiasis of vulva and vagina: Secondary | ICD-10-CM

## 2018-01-28 MED ORDER — VITAFOL GUMMIES 3.33-0.333-34.8 MG PO CHEW
3.0000 | CHEWABLE_TABLET | Freq: Every day | ORAL | 12 refills | Status: DC
Start: 2018-01-28 — End: 2019-03-19

## 2018-01-28 MED ORDER — TERCONAZOLE 0.8 % VA CREA: 1.0000 | TOPICAL_CREAM | Freq: Every day | VAGINAL | 0 refills | Status: DC

## 2018-01-28 MED ORDER — DOXYLAMINE-PYRIDOXINE 10-10 MG PO TBEC: DELAYED_RELEASE_TABLET | ORAL | 4 refills | Status: DC

## 2018-01-28 MED ORDER — FLUCONAZOLE 150 MG PO TABS: 150.0000 mg | ORAL_TABLET | Freq: Once | ORAL | 0 refills | Status: AC

## 2018-01-28 NOTE — Patient Instructions (Signed)
Probiotics What are probiotics? Probiotics are the good bacteria and yeasts that live in your body and keep you and your digestive system healthy. Probiotics also help your body's defense (immune) system and protect your body against bad bacterial growth. Certain foods contain probiotics, such as yogurt. Probiotics can also be purchased as a supplement. As with any supplement or drug, it is important to discuss its use with your health care provider. What affects the balance of bacteria in my body? The balance of bacteria in your body can be affected by:  Antibiotic medicines. Antibiotics are sometimes necessary to treat infection. Unfortunately, they may kill good or friendly bacteria in your body as well as the bad bacteria. This may lead to stomach problems like diarrhea, gas, and cramping.  Disease. Some conditions are the result of an overgrowth of bad bacteria, yeasts, parasites, or fungi. These conditions include: ? Infectious diarrhea. ? Stomach and respiratory infections. ? Skin infections. ? Irritable bowel syndrome (IBS). ? Inflammatory bowel diseases. ? Ulcer due to Helicobacter pylori (H. pylori) infection. ? Tooth decay and periodontal disease. ? Vaginal infections.  Stress and poor diet may also lower the good bacteria in your body. What type of probiotic is right for me? Probiotics are available over the counter at your local pharmacy, health food, or grocery store. They come in many different forms, combinations of strains, and dosing strengths. Some may need to be refrigerated. Always read the label for storage and usage instructions. Specific strains have been shown to be more effective for certain conditions. Ask your health care provider what option is best for you. Why would I need probiotics? There are many reasons your health care provider might recommend a probiotic supplement, including:  Diarrhea.  Constipation.  IBS.  Respiratory infections.  Yeast  infections.  Acne, eczema, and other skin conditions.  Frequent urinary tract infections (UTIs).  Are there side effects of probiotics? Some people experience mild side effects when taking probiotics. Side effects are usually temporary and may include:  Gas.  Bloating.  Cramping.  Rarely, serious side effects, such as infection or immune system changes, may occur. What else do I need to know about probiotics?  There are many different strains of probiotics. Certain strains may be more effective depending on your condition. Probiotics are available in varying doses. Ask your health care provider which probiotic you should use and how often.  If you are taking probiotics along with antibiotics, it is generally recommended to wait at least 2 hours between taking the antibiotic and taking the probiotic. For more information: [facility] [email or web address] This information is not intended to replace advice given to you by your health care provider. Make sure you discuss any questions you have with your health care provider. Document Released: 03/10/2014 Document Revised: 07/10/2016 Document Reviewed: 11/10/2013 Elsevier Interactive Patient Education  2017 [employer].  Second Trimester of Pregnancy The second trimester is from week 13 through week 28, month 4 through 6. This is often the time in pregnancy that you feel your best. Often times, morning sickness has lessened or quit. You may have more energy, and you may get hungry more often. Your unborn baby (fetus) is growing rapidly. At the end of the sixth month, he or she is about 9 inches long and weighs about 1 pounds. You will likely feel the baby move (quickening) between 18 and 20 weeks of pregnancy. Follow these instructions at home:  Avoid all smoking, herbs, and alcohol.  Avoid drugs not approved by your doctor.  Do not use any tobacco products, including cigarettes, chewing  tobacco, and electronic cigarettes. If you need help quitting, ask your doctor. You may get counseling or other support to help you quit.  Only take medicine as told by your doctor. Some medicines are safe and some are not during pregnancy.  Exercise only as told by your doctor. Stop exercising if you start having cramps.  Eat regular, healthy meals.  Wear a good support bra if your breasts are tender.  Do not use hot tubs, steam rooms, or saunas.  Wear your seat belt when driving.  Avoid raw meat, uncooked cheese, and liter boxes and soil used by cats.  Take your prenatal vitamins.  Take 1500-2000 milligrams of calcium daily starting at the 20th week of pregnancy until you deliver your baby.  Try taking medicine that helps you poop (stool softener) as needed, and if your doctor approves. Eat more fiber by eating fresh fruit, vegetables, and whole grains. Drink enough fluids to keep your pee (urine) clear or pale yellow.  Take warm water baths (sitz baths) to soothe pain or discomfort caused by hemorrhoids. Use hemorrhoid cream if your doctor approves.  If you have puffy, bulging veins (varicose veins), wear support hose. Raise (elevate) your feet for 15 minutes, 3-4 times a day. Limit salt in your diet.  Avoid heavy lifting, wear low heals, and sit up straight.  Rest with your legs raised if you have leg cramps or low back pain.  Visit your dentist if you have not gone during your pregnancy. Use a soft toothbrush to brush your teeth. Be gentle when you floss.  You can have sex (intercourse) unless your doctor tells you not to.  Go to your doctor visits. Get help if:  You feel dizzy.  You have mild cramps or pressure in your lower belly (abdomen).  You have a nagging pain in your belly area.  You continue to feel sick to your stomach (nauseous), throw up (vomit), or have watery poop (diarrhea).  You have bad smelling fluid coming from your vagina.  You have pain with  peeing (urination). Get help right away if:  You have a fever.  You are leaking fluid from your vagina.  You have spotting or bleeding from your vagina.  You have severe belly cramping or pain.  You lose or gain weight rapidly.  You have trouble catching your breath and have chest pain.  You notice sudden or extreme puffiness (swelling) of your face, hands, ankles, feet, or legs.  You have not felt the baby move in over an hour.  You have severe headaches that do not go away with medicine.  You have vision changes. This information is not intended to replace advice given to you by your health care provider. Make sure you discuss any questions you have with your health care provider. Document Released: 11/07/2009 Document Revised: 01/19/2016 Document Reviewed: 10/14/2012 Elsevier Interactive Patient Education  2017 [employer] Skin Conditions During Pregnancy Pregnancy affects many parts of your body. One part is your skin. Most skin problems that develop during pregnancy are not serious and are considered a normal part of pregnancy. They go away on their own after the baby is born. Other skin problems may need treatment. What type of skin problems can develop during pregnancy?  Stretch marks. Stretch marks are purple or pink lines on the skin. They may appear on the belly, breasts, thighs, or buttocks. Stretch marks  are caused by weight gain that causes the skin to stretch. Stretch marks do not cause problems. Almost all women get them during pregnancy.  Darkening of the skin (hyperpigmentation). The darkening may occur in patches or as a line. Patches may appear on the face, nipples, or genital area. Lines often stretch from the belly button to the pubic area. Hyperpigmentation develops in almost all pregnant women. It is more severe in women with a dark complexion.  Spider angiomas. These are tiny pink or red lines that go out from a center point, like the legs of a spider.  Usually, they are on the face, neck, and arms. They do not cause problems. They are most common in women with light complexions.  Palmar erythema. This is a reddening of the palms. It is most common in women with light complexions.  Swelling and redness. This can occur on the face, eyelids, fingers, or toes.  Pruritic urticarial papules and plaques of pregnancy (PUPPP). This is a rash that is itchy, red, and has tiny blisters. The cause is unknown. It usually starts on the abdomen and may affect the arms or legs. It does not affect the face. It usually begins later in pregnancy. About a third of all pregnant women develop this condition. There are no associated problems to the fetus with this rash. Sometimes, oral steroids are used to calm down the itch. The rash clears after the baby is born.  Prurigo of pregnancy. This is a disease in which red patches and bumps appear on the arms and legs. The cause is unknown. The patches and bumps clear after the baby is born. About a third of pregnant women develop this disease.  Acne. Pimples may develop, including in women who have had clear skin for a long time.  Skin tags. These are small flaps of skin that stick out from the body. They may grow or become darker during pregnancy. They are usually harmless.  Moles. These are flat or slightly raised growths. They are usually round and pink or brown. They may grow or become darker during pregnancy.  Intrahepatic cholestasis of pregnancy. This is a rare condition that causes itchy skin. It may run in families. It increases the risk of complications for the fetus. This condition usually resolves after delivery. It can recur with subsequent pregnancies.  Impetigo herpetiformis. This is a form of a severe skin disease called pustular psoriasis. Usually, delivery is the only method of resolving the condition.  Pruritic folliculitis of pregnancy. This is a rare condition that causes pimple-like skin growths. It  develops in the middle or later stages of pregnancy. Its cause is unknown.It usually resolves 2-3 weeks after delivery.  Pemphigoid gestationis. This is a very rare autoimmune disease. It causes a severely itchy rash and blisters. The rash does not appear on the face, scalp, or inside of the mouth. It usually resolves 3 months after delivery. It may recur with subsequent pregnancies. Some pre-existing skin conditions, such as atopic dermatitis, may become worse during pregnancy. Follow these instructions at home: Different conditions may have different instructions. In general:  Follow all your health care provider's directions about medicines to treat skin problems while you are pregnant. Do not use any over-the-counter medicines (including medicated creams and lotions) until you have checked with your health care provider. Many medicines are not safe to use when you are pregnant.  Avoid time in the sun. This will help keep your skin from darkening. When you must be outside, use  sunscreen and wear a hat with a wide brim to protect your face. The sunscreen should have a SPF of at least 15. This may help limit dark spots that develop when the skin is exposed to the sun.  To avoid problems from stretched skin: ? Do not sit or stand for long periods of time. ? Exercise regularly. This helps keep your skin in good condition.  Use a gentle soap. This helps prevent acne.  Do not get too hot or too sweaty. This makes some skin rashes worse.  Wear loose clothes made of a soft fabric. This prevents skin irritation.  For itching, add oatmeal or cornstarch to your bathwater.  Use a skin moisturizer. Ask your health care provider for suggestions.  This information is not intended to replace advice given to you by your health care provider. Make sure you discuss any questions you have with your health care provider. Document Released: 09/15/2010 Document Revised: 01/19/2016 Document Reviewed:  05/25/2013 Elsevier Interactive Patient Education  [employer].

## 2018-01-28 NOTE — Progress Notes (Signed)
Subjective:   Traci Castro is a 22 y.o. G3P1011 at [redacted]w[redacted]d by LMP being seen today for her first obstetrical visit.  Her obstetrical history is significant for obesity. Patient does intend to breast feed. Pregnancy history fully reviewed.  Was seen at dermatology on 01/27/18 by Dr. Zachery Conch in [place].  Has extreme eczema/psoriasis.   Patient reports nausea, no bleeding, no cramping, no leaking, vaginal irritation and vomiting.  HISTORY: OB History  Gravida Para Term Preterm AB Living  3 1 1  0 1 1  SAB TAB Ectopic Multiple Live Births  0 1 0 0 1    # Outcome Date GA Lbr Len/2nd Weight Sex Delivery Anes PTL Lv  3 Current           2 TAB 2019 [redacted]w[redacted]d         1 Term 05/05/16 [redacted]w[redacted]d 03:05 / 00:50 7 lb 6.2 oz (3.351 kg) M Vag-Spont Local  LIV     Name: Zenon Mayo     Apgar1: 8  Apgar5: 9    Last pap smear was done unknown  Past Medical History:  Diagnosis Date  . Anemia   . Anxiety    no meds  . Bipolar disorder (HCC)   . Depression    no meds  . Gastritis    2 bouts  . H/O seasonal allergies   . Migraine   . Psoriasis   . SVD (spontaneous vaginal delivery)    x 1  . Vision abnormalities    wears glasses   Past Surgical History:  Procedure Laterality Date  . DILATION AND CURETTAGE OF UTERUS    . DILATION AND EVACUATION N/A 09/20/2017   Procedure: SUCTION DILATATION AND EVACUATION;  Surgeon: Converse Bing, MD;  Location: [facility];  Service: Gynecology;  Laterality: N/A;  . ESOPHAGOGASTRODUODENOSCOPY N/A 10/17/2012   Procedure: ESOPHAGOGASTRODUODENOSCOPY (EGD);  Surgeon: Jon Gills, MD;  Location: [facility] OR;  Service: Gastroenterology;  Laterality: N/A;  . THERAPEUTIC ABORTION  09/16/2017   Clinic  . WISDOM TOOTH EXTRACTION     Family History  Problem Relation Age of Onset  . Cholelithiasis Mother   . Hyperlipidemia Mother   . Hypertension Mother   . Mental illness Mother   . Cholelithiasis Maternal Uncle   . Hyperlipidemia Maternal Uncle   .  Hypertension Maternal Uncle   . Cholelithiasis Maternal Grandmother   . Arthritis Maternal Grandmother   . COPD Maternal Grandmother   . Depression Maternal Grandmother   . Diabetes Maternal Grandmother   . Heart disease Maternal Grandmother   . Hyperlipidemia Maternal Grandmother   . Hypertension Maternal Grandmother   . Stroke Maternal Grandmother   . Learning disabilities Paternal Grandmother   . Mental illness Paternal Grandmother   . Cancer Paternal Grandmother   . Hyperlipidemia Paternal Grandfather   . Diabetes Paternal Grandfather   . Depression Father   . Hypertension Father   . Ulcers Neg Hx   . Alcohol abuse Neg Hx   . Asthma Neg Hx   . Birth defects Neg Hx   . Drug abuse Neg Hx   . Early death Neg Hx   . Hearing loss Neg Hx   . Kidney disease Neg Hx   . Mental retardation Neg Hx   . Miscarriages / Stillbirths Neg Hx   . Vision loss Neg Hx    Social History   Tobacco Use  . Smoking status: Never Smoker  . Smokeless tobacco: Never Used  Substance Use Topics  .  Alcohol use: No  . Drug use: No   Allergies  Allergen Reactions  . Chlorhexidine Itching and Rash    Severe itching, redness.   . Tramadol Nausea And Vomiting   Current Outpatient Medications on File Prior to Visit  Medication Sig Dispense Refill  . cephALEXin (KEFLEX) 500 MG capsule Take 1 capsule (500 mg total) by mouth 4 (four) times daily. 28 capsule 0  . metroNIDAZOLE (METROGEL VAGINAL) 0.75 % vaginal gel Place 1 Applicatorful vaginally 2 (two) times daily. 70 g 0  . ondansetron (ZOFRAN ODT) 4 MG disintegrating tablet Take 1 tablet (4 mg total) by mouth every 8 (eight) hours as needed for nausea or vomiting. 20 tablet 0   No current facility-administered medications on file prior to visit.     Review of Systems Pertinent items noted in HPI and remainder of comprehensive ROS otherwise negative.  Exam   Vitals:   01/28/18 1326  BP: 116/79  Pulse: 86  Weight: 280 lb 6.4 oz (127.2 kg)    Fetal Heart Rate (bpm): 160; doppler  Uterus:     Pelvic Exam: Perineum: no hemorrhoids, normal perineum   Vulva: normal external genitalia, no lesions   Vagina:  normal mucosa, + copious white chunky vaginal discharge   Cervix: no lesions and normal, pap smear done.    Adnexa: normal adnexa and no mass, fullness, tenderness   Bony Pelvis: average  System: General: well-developed, well-nourished female in no acute distress   Breast:  normal appearance, no masses or tenderness   Skin: normal coloration and turgor, no rashes   Neurologic: oriented, normal, negative, normal mood   Extremities: normal strength, tone, and muscle mass, ROM of all joints is normal   HEENT PERRLA, extraocular movement intact and sclera clear, anicteric   Mouth/Teeth mucous membranes moist, pharynx normal without lesions and dental hygiene good   Neck supple and no masses   Cardiovascular: regular rate and rhythm   Respiratory:  no respiratory distress, normal breath sounds   Abdomen: soft, non-tender; bowel sounds normal; no masses,  no organomegaly     Assessment:   Pregnancy: [id number] Patient Active Problem List   Diagnosis Date Noted  . [name], antepartum 01/27/2018  . Chlamydia 09/25/2017  . Gestation size to date discrepancy 04/16/2016  . BMI 45.0-49.9, adult (HCC) 03/15/2016     Plan:  1. [name], antepartum    Letter composed to be able to sit while working d/t hx of vaginal bleeding and cramping this pregnancy.   - Enroll Patient in Babyscripts - Obstetric Panel, Including HIV - Hemoglobinopathy evaluation - Culture, OB Urine - Cervicovaginal ancillary only - Cytology - PAP - Genetic Screening - Inheritest Core(CF97,SMA,FraX) - Vitamin D (25 hydroxy) - Hemoglobin A1c - Prenatal Vit-Fe Phos-FA-Omega (VITAFOL GUMMIES) 3.33-0.333-34.8 MG CHEW; Chew 3 tablets by mouth at bedtime.  Dispense: 90 tablet; Refill: 12  2. Yeast  vaginitis    - fluconazole (DIFLUCAN) 150 MG tablet; Take 1 tablet (150 mg total) by mouth once for 1 dose.  Dispense: 1 tablet; Refill: 0 - terconazole (TERAZOL 3) 0.8 % vaginal cream; Place 1 applicator vaginally at bedtime.  Dispense: 20 g; Refill: 0  3. Nausea/vomiting in pregnancy     - Doxylamine-Pyridoxine (DICLEGIS) 10-10 MG TBEC; Take 1 tablet with breakfast and lunch.  Take 2 tablets at bedtime.  Dispense: 100 tablet; Refill: 4   Initial labs drawn. Continue prenatal vitamins. Genetic Screening discussed, NIPS: ordered. Ultrasound discussed; fetal  anatomic survey: ordered. Problem list reviewed and updated. The nature of [place] [facility] with multiple MDs and other Advanced Practice Providers was explained to patient; also emphasized that residents, students are part of our team. Routine obstetric precautions reviewed. Return in about 1 month (around 02/25/2018) for ROB.     Orvilla Cornwallachelle Denney, CNM [facility]

## 2018-01-28 NOTE — Progress Notes (Signed)
Presents for NOB visit.  C/o intermittent spotting.

## 2018-01-29 LAB — CERVICOVAGINAL ANCILLARY ONLY
Chlamydia: NEGATIVE
Neisseria Gonorrhea: NEGATIVE

## 2018-01-30 ENCOUNTER — Other Ambulatory Visit: Payer: Self-pay | Admitting: Certified Nurse Midwife

## 2018-01-30 LAB — OBSTETRIC PANEL, INCLUDING HIV
ANTIBODY SCREEN: NEGATIVE
BASOS: 0 %
Basophils Absolute: 0 10*3/uL (ref 0.0–0.2)
EOS (ABSOLUTE): 0.1 10*3/uL (ref 0.0–0.4)
EOS: 1 %
HEMATOCRIT: 35.6 % (ref 34.0–46.6)
HEMOGLOBIN: 11.3 g/dL (ref 11.1–15.9)
HEP B S AG: NEGATIVE
HIV Screen 4th Generation wRfx: NONREACTIVE
IMMATURE GRANS (ABS): 0 10*3/uL (ref 0.0–0.1)
IMMATURE GRANULOCYTES: 0 %
LYMPHS: 42 %
Lymphocytes Absolute: 3.1 10*3/uL (ref 0.7–3.1)
MCH: 23.7 pg — AB (ref 26.6–33.0)
MCHC: 31.7 g/dL (ref 31.5–35.7)
MCV: 75 fL — ABNORMAL LOW (ref 79–97)
MONOS ABS: 0.5 10*3/uL (ref 0.1–0.9)
Monocytes: 7 %
NEUTROS PCT: 50 %
Neutrophils Absolute: 3.8 10*3/uL (ref 1.4–7.0)
Platelets: 310 10*3/uL (ref 150–450)
RBC: 4.77 x10E6/uL (ref 3.77–5.28)
RDW: 17.4 % — ABNORMAL HIGH (ref 12.3–15.4)
RH TYPE: POSITIVE
RPR Ser Ql: NONREACTIVE
Rubella Antibodies, IGG: 0.97 index — ABNORMAL LOW (ref >0.99)
WBC: 7.5 10*3/uL (ref 3.4–10.8)

## 2018-01-30 LAB — CYTOLOGY - PAP: Diagnosis: NEGATIVE

## 2018-01-30 LAB — HEMOGLOBIN A1C
Est. average glucose Bld gHb Est-mCnc: 114 mg/dL
Hgb A1c MFr Bld: 5.6 % (ref 4.8–5.6)

## 2018-01-30 LAB — HEMOGLOBINOPATHY EVALUATION
HGB C: 0 %
HGB S: 0 %
HGB VARIANT: 0 %
Hemoglobin A2 Quantitation: 2.1 % (ref 1.8–3.2)
Hemoglobin F Quantitation: 0 % (ref 0.0–2.0)
Hgb A: 97.9 % (ref 96.4–98.8)

## 2018-01-30 LAB — VITAMIN D 25 HYDROXY (VIT D DEFICIENCY, FRACTURES): Vit D, 25-Hydroxy: 28.1 ng/mL — ABNORMAL LOW (ref 30.0–100.0)

## 2018-01-31 ENCOUNTER — Other Ambulatory Visit: Payer: Self-pay | Admitting: Certified Nurse Midwife

## 2018-01-31 DIAGNOSIS — E559 Vitamin D deficiency, unspecified: Secondary | ICD-10-CM | POA: Insufficient documentation

## 2018-01-31 DIAGNOSIS — O09899 Supervision of other high risk pregnancies, unspecified trimester: Secondary | ICD-10-CM | POA: Insufficient documentation

## 2018-01-31 DIAGNOSIS — Z283 Underimmunization status: Secondary | ICD-10-CM | POA: Insufficient documentation

## 2018-01-31 DIAGNOSIS — Z3480 Encounter for supervision of other normal pregnancy, unspecified trimester: Secondary | ICD-10-CM

## 2018-01-31 DIAGNOSIS — O9989 Other specified diseases and conditions complicating pregnancy, childbirth and the puerperium: Principal | ICD-10-CM

## 2018-01-31 HISTORY — DX: Vitamin D deficiency, unspecified: E55.9

## 2018-01-31 LAB — CULTURE, OB URINE

## 2018-01-31 LAB — URINE CULTURE, OB REFLEX

## 2018-01-31 MED ORDER — VITAMIN D (ERGOCALCIFEROL) 1.25 MG (50000 UNIT) PO CAPS: 50000.0000 [IU] | ORAL_CAPSULE | ORAL | 2 refills | Status: DC

## 2018-02-03 ENCOUNTER — Encounter: Payer: Self-pay | Admitting: *Deleted

## 2018-02-03 ENCOUNTER — Other Ambulatory Visit: Payer: Self-pay | Admitting: Certified Nurse Midwife

## 2018-02-03 DIAGNOSIS — Z3480 Encounter for supervision of other normal pregnancy, unspecified trimester: Secondary | ICD-10-CM

## 2018-02-05 ENCOUNTER — Encounter: Payer: Self-pay | Admitting: Certified Nurse Midwife

## 2018-02-06 ENCOUNTER — Encounter (HOSPITAL_COMMUNITY): Payer: Self-pay

## 2018-02-06 ENCOUNTER — Other Ambulatory Visit: Payer: Self-pay

## 2018-02-06 ENCOUNTER — Inpatient Hospital Stay (HOSPITAL_COMMUNITY)
Admission: AD | Admit: 2018-02-06 | Discharge: 2018-02-07 | Disposition: A | Discharge status: Home / Self Care | Payer: 59 | Source: Ambulatory Visit | Attending: Obstetrics & Gynecology | Admitting: Obstetrics & Gynecology

## 2018-02-06 DIAGNOSIS — Z888 Allergy status to other drugs, medicaments and biological substances status: Secondary | ICD-10-CM | POA: Insufficient documentation

## 2018-02-06 DIAGNOSIS — Z823 Family history of stroke: Secondary | ICD-10-CM | POA: Insufficient documentation

## 2018-02-06 DIAGNOSIS — L409 Psoriasis, unspecified: Secondary | ICD-10-CM | POA: Insufficient documentation

## 2018-02-06 DIAGNOSIS — O99712 Diseases of the skin and subcutaneous tissue complicating pregnancy, second trimester: Secondary | ICD-10-CM | POA: Diagnosis not present

## 2018-02-06 DIAGNOSIS — Z833 Family history of diabetes mellitus: Secondary | ICD-10-CM | POA: Insufficient documentation

## 2018-02-06 DIAGNOSIS — O26892 Other specified pregnancy related conditions, second trimester: Secondary | ICD-10-CM | POA: Insufficient documentation

## 2018-02-06 DIAGNOSIS — F419 Anxiety disorder, unspecified: Secondary | ICD-10-CM | POA: Insufficient documentation

## 2018-02-06 DIAGNOSIS — Z825 Family history of asthma and other chronic lower respiratory diseases: Secondary | ICD-10-CM | POA: Insufficient documentation

## 2018-02-06 DIAGNOSIS — R110 Nausea: Secondary | ICD-10-CM | POA: Diagnosis not present

## 2018-02-06 DIAGNOSIS — Z818 Family history of other mental and behavioral disorders: Secondary | ICD-10-CM | POA: Diagnosis not present

## 2018-02-06 DIAGNOSIS — R109 Unspecified abdominal pain: Secondary | ICD-10-CM

## 2018-02-06 DIAGNOSIS — Z9889 Other specified postprocedural states: Secondary | ICD-10-CM | POA: Insufficient documentation

## 2018-02-06 DIAGNOSIS — O26891 Other specified pregnancy related conditions, first trimester: Secondary | ICD-10-CM | POA: Diagnosis not present

## 2018-02-06 DIAGNOSIS — Z8249 Family history of ischemic heart disease and other diseases of the circulatory system: Secondary | ICD-10-CM | POA: Insufficient documentation

## 2018-02-06 DIAGNOSIS — O99342 Other mental disorders complicating pregnancy, second trimester: Secondary | ICD-10-CM | POA: Diagnosis not present

## 2018-02-06 DIAGNOSIS — R140 Abdominal distension (gaseous): Secondary | ICD-10-CM

## 2018-02-06 DIAGNOSIS — Z79899 Other long term (current) drug therapy: Secondary | ICD-10-CM | POA: Insufficient documentation

## 2018-02-06 DIAGNOSIS — R1030 Lower abdominal pain, unspecified: Secondary | ICD-10-CM | POA: Diagnosis not present

## 2018-02-06 DIAGNOSIS — Z3A15 15 weeks gestation of pregnancy: Secondary | ICD-10-CM | POA: Insufficient documentation

## 2018-02-06 DIAGNOSIS — O26899 Other specified pregnancy related conditions, unspecified trimester: Secondary | ICD-10-CM

## 2018-02-06 LAB — URINALYSIS, ROUTINE W REFLEX MICROSCOPIC
Bilirubin Urine: NEGATIVE
GLUCOSE, UA: NEGATIVE mg/dL
HGB URINE DIPSTICK: NEGATIVE
Ketones, ur: NEGATIVE mg/dL
NITRITE: NEGATIVE
Protein, ur: NEGATIVE mg/dL
SPECIFIC GRAVITY, URINE: 1.028 (ref 1.005–1.030)
pH: 5 (ref 5.0–8.0)

## 2018-02-06 MED ORDER — ONDANSETRON 4 MG PO TBDP
4.0000 mg | ORAL_TABLET | Freq: Once | ORAL | Status: AC
  Administered 2018-02-06: 4 mg via ORAL
  Filled 2018-02-06: qty 1

## 2018-02-06 MED ORDER — HYOSCYAMINE SULFATE 0.125 MG SL SUBL
0.1250 mg | SUBLINGUAL_TABLET | Freq: Once | SUBLINGUAL | Status: AC
  Administered 2018-02-06: 0.125 mg via SUBLINGUAL
  Filled 2018-02-06: qty 1

## 2018-02-06 NOTE — MAU Note (Signed)
Pt here with c/o nausea and abdominal pain. Denies leaking or bleeding.

## 2018-02-06 NOTE — MAU Note (Signed)
Pt presents to MAU with Nausea and lower abd. pain since yesterday. States it has been constant today. Rates the abd. Pain as 8/10 and stabbing. Denies vaginal discharge or bleeding, and denies pain with urination.

## 2018-02-06 NOTE — MAU Provider Note (Signed)
Chief Complaint:  Abdominal Pain and Nausea   First Provider Initiated Contact with Patient 02/06/18 2319     HPI: Traci Castro is a 22 y.o. G3P1011 at 3415w5dwho presents to maternity admissions reporting nausea and generalized abdominal pain since yesterday. Yesterday it was intermittent, today more constant. Told RN it was lower abdominal , tells me it is all over and moves around.  Does not indicate lower abdominal to me.. She denies LOF, vaginal bleeding, vaginal itching/burning, urinary symptoms, h/a, dizziness, n/v, diarrhea, constipation or fever/chills.    Abdominal Pain  This is a new problem. The current episode started yesterday. The onset quality is gradual. The problem occurs intermittently. The problem has been waxing and waning. The pain is located in the generalized abdominal region. The quality of the pain is cramping and sharp. The abdominal pain does not radiate. Associated symptoms include nausea. Pertinent negatives include no anorexia, constipation, diarrhea, dysuria, fever, frequency, headaches, myalgias or vomiting. Nothing aggravates the pain. The pain is relieved by nothing. She has tried nothing for the symptoms.   RN Note: Pt presents to MAU with Nausea and lower abd. pain since yesterday. States it has been constant today. Rates the abd. Pain as 8/10 and stabbing. Denies vaginal discharge or bleeding, and denies pain with urination.     Past Medical History: Past Medical History:  Diagnosis Date  . Anemia   . Anxiety    no meds  . Bipolar disorder (HCC)   . Depression    no meds  . Gastritis    2 bouts  . H/O seasonal allergies   . Migraine   . Psoriasis   . SVD (spontaneous vaginal delivery)    x 1  . Vision abnormalities    wears glasses    Past obstetric history: OB History  Gravida Para Term Preterm AB Living  3 1 1   1 1   SAB TAB Ectopic Multiple Live Births    1   0 1    # Outcome Date GA Lbr Len/2nd Weight Sex Delivery Anes PTL Lv  3  Current           2 TAB 2019 3824w3d         1 Term 05/05/16 2984w2d 03:05 / 00:50 7 lb 6.2 oz (3.351 kg) M Vag-Spont Local  LIV    Past Surgical History: Past Surgical History:  Procedure Laterality Date  . DILATION AND CURETTAGE OF UTERUS    . DILATION AND EVACUATION N/A 09/20/2017   Procedure: SUCTION DILATATION AND EVACUATION;  Surgeon: [place] BingPickens, Charlie, MD;  Location: [facility];  Service: Gynecology;  Laterality: N/A;  . ESOPHAGOGASTRODUODENOSCOPY N/A 10/17/2012   Procedure: ESOPHAGOGASTRODUODENOSCOPY (EGD);  Surgeon: Jon GillsJoseph H Clark, MD;  [facility];  Service: Gastroenterology;  Laterality: N/A;  . THERAPEUTIC ABORTION  09/16/2017   Clinic  . WISDOM TOOTH EXTRACTION      Family History: Family History  Problem Relation Age of Onset  . Cholelithiasis Mother   . Hyperlipidemia Mother   . Hypertension Mother   . Mental illness Mother   . Cholelithiasis Maternal Uncle   . Hyperlipidemia Maternal Uncle   . Hypertension Maternal Uncle   . Cholelithiasis Maternal Grandmother   . Arthritis Maternal Grandmother   . COPD Maternal Grandmother   . Depression Maternal Grandmother   . Diabetes Maternal Grandmother   . Heart disease Maternal Grandmother   . Hyperlipidemia Maternal Grandmother   . Hypertension Maternal Grandmother   . Stroke Maternal Grandmother   .  Learning disabilities Paternal Grandmother   . Mental illness Paternal Grandmother   . Cancer Paternal Grandmother   . Hyperlipidemia Paternal Grandfather   . Diabetes Paternal Grandfather   . Depression Father   . Hypertension Father   . Ulcers Neg Hx   . Alcohol abuse Neg Hx   . Asthma Neg Hx   . Birth defects Neg Hx   . Drug abuse Neg Hx   . Early death Neg Hx   . Hearing loss Neg Hx   . Kidney disease Neg Hx   . Mental retardation Neg Hx   . Miscarriages / Stillbirths Neg Hx   . Vision loss Neg Hx     Social History: Social History   Tobacco Use  . Smoking status: Never Smoker  . Smokeless tobacco: Never  Used  Substance Use Topics  . Alcohol use: No  . Drug use: No    Allergies:  Allergies  Allergen Reactions  . Chlorhexidine Itching and Rash    Severe itching, redness.   . Tramadol Nausea And Vomiting    Meds:  Medications Prior to Admission  Medication Sig Dispense Refill Last Dose  . acetaminophen (TYLENOL) 500 MG tablet Take 500 mg by mouth every 6 (six) hours as needed.   02/06/2018 at 1700  . cephALEXin (KEFLEX) 500 MG capsule Take 1 capsule (500 mg total) by mouth 4 (four) times daily. 28 capsule 0 02/05/2018 at Unknown time  . Doxylamine-Pyridoxine (DICLEGIS) 10-10 MG TBEC Take 1 tablet with breakfast and lunch.  Take 2 tablets at bedtime. 100 tablet 4 02/05/2018 at Unknown time  . metroNIDAZOLE (METROGEL VAGINAL) 0.75 % vaginal gel Place 1 Applicatorful vaginally 2 (two) times daily. 70 g 0 02/05/2018 at Unknown time  . ondansetron (ZOFRAN ODT) 4 MG disintegrating tablet Take 1 tablet (4 mg total) by mouth every 8 (eight) hours as needed for nausea or vomiting. 20 tablet 0 Past Week at Unknown time  . Prenatal Vit-Fe Phos-FA-Omega (VITAFOL GUMMIES) 3.33-0.333-34.8 MG CHEW Chew 3 tablets by mouth at bedtime. 90 tablet 12 02/05/2018 at Unknown time  . terconazole (TERAZOL 3) 0.8 % vaginal cream Place 1 applicator vaginally at bedtime. 20 g 0 02/05/2018 at Unknown time  . Vitamin D, Ergocalciferol, (DRISDOL) 50000 units CAPS capsule Take 1 capsule (50,000 Units total) by mouth every 7 (seven) days. 30 capsule 2 Unknown at Unknown time    I have reviewed patient's Past Medical Hx, Surgical Hx, Family Hx, Social Hx, medications and allergies.   ROS:  Review of Systems  Constitutional: Negative for fever.  Gastrointestinal: Positive for abdominal pain and nausea. Negative for anorexia, constipation, diarrhea and vomiting.  Genitourinary: Negative for dysuria and frequency.  Musculoskeletal: Negative for myalgias.  Neurological: Negative for headaches.   Other systems  negative  Physical Exam   Patient Vitals for the past 24 hrs:  BP Temp Pulse Resp SpO2 Height Weight  02/06/18 2248 127/60 98.6 F (37 C) 79 18 100 % 5\' 3"  (1.6 m) 283 lb (128.4 kg)   Constitutional: Well-developed, well-nourished female in no acute distress.  Cardiovascular: normal rate and rhythm Respiratory: normal effort, clear to auscultation bilaterally GI: Abd soft, non-tender, gravid appropriate for gestational age.   No rebound or guarding. MS: Extremities nontender, no edema, normal ROM Neurologic: Alert and oriented x 4.  GU: Neg CVAT.  PELVIC EXAM: Cervix long and closed.    FHT: 145   Labs: Results for orders placed or performed during the hospital encounter of 02/06/18 (from  the past 24 hour(s))  Urinalysis, Routine w reflex microscopic     Status: Abnormal   Collection Time: 02/06/18 10:57 PM  Result Value Ref Range   Color, Urine YELLOW YELLOW   APPearance CLEAR CLEAR   Specific Gravity, Urine 1.028 1.005 - 1.030   pH 5.0 5.0 - 8.0   Glucose, UA NEGATIVE NEGATIVE mg/dL   Hgb urine dipstick NEGATIVE NEGATIVE   Bilirubin Urine NEGATIVE NEGATIVE   Ketones, ur NEGATIVE NEGATIVE mg/dL   Protein, ur NEGATIVE NEGATIVE mg/dL   Nitrite NEGATIVE NEGATIVE   Leukocytes, UA TRACE (A) NEGATIVE   RBC / HPF 0-5 0 - 5 RBC/hpf   WBC, UA 6-10 0 - 5 WBC/hpf   Bacteria, UA RARE (A) NONE SEEN   Squamous Epithelial / LPF 6-10 0 - 5   Mucus PRESENT   CBC with Differential/Platelet     Status: Abnormal   Collection Time: 02/06/18 11:44 PM  Result Value Ref Range   WBC 8.9 4.0 - 10.5 K/uL   RBC 4.34 3.87 - 5.11 MIL/uL   Hemoglobin 10.5 (L) 12.0 - 15.0 g/dL   HCT 16.1 (L) 09.6 - 04.5 %   MCV 76.5 (L) 78.0 - 100.0 fL   MCH 24.2 (L) 26.0 - 34.0 pg   MCHC 31.6 30.0 - 36.0 g/dL   RDW 40.9 (H) 81.1 - 91.4 %   Platelets 275 150 - 400 K/uL   Neutrophils Relative % 46 %   Neutro Abs 4.1 1.7 - 7.7 K/uL   Lymphocytes Relative 49 %   Lymphs Abs 4.4 (H) 0.7 - 4.0 K/uL   Monocytes  Relative 4 %   Monocytes Absolute 0.4 0.1 - 1.0 K/uL   Eosinophils Relative 1 %   Eosinophils Absolute 0.1 0.0 - 0.7 K/uL   Basophils Relative 0 %   Basophils Absolute 0.0 0.0 - 0.1 K/uL  Comprehensive metabolic panel     Status: Abnormal   Collection Time: 02/06/18 11:44 PM  Result Value Ref Range   Sodium 137 135 - 145 mmol/L   Potassium 3.9 3.5 - 5.1 mmol/L   Chloride 106 101 - 111 mmol/L   CO2 22 22 - 32 mmol/L   Glucose, Bld 106 (H) 65 - 99 mg/dL   BUN 10 6 - 20 mg/dL   Creatinine, Ser 7.82 0.44 - 1.00 mg/dL   Calcium 8.5 (L) 8.9 - 10.3 mg/dL   Total Protein 6.6 6.5 - 8.1 g/dL   Albumin 3.1 (L) 3.5 - 5.0 g/dL   AST 16 15 - 41 U/L   ALT 15 14 - 54 U/L   Alkaline Phosphatase 57 38 - 126 U/L   Total Bilirubin 0.3 0.3 - 1.2 mg/dL   GFR calc non Af Amer >60 >60 mL/min   GFR calc Af Amer >60 >60 mL/min   Anion gap 9 5 - 15    A/Positive/-- (06/04 1521)  Imaging:  No results found.  MAU Course/MDM: I have ordered labs and reviewed results. CBC is normal with no leukocytosis.  CMET normal . UA normal Levsin given with some relief Suspect this is intestinal colic, based on pattern of pain and normal lab findings. .  Treatments in MAU included Levsin.    Assessment: 1. [name], antepartum   2.     Intrauterne pregnancy at [redacted]w[redacted]d 3.     Abdominal pain, likely bloating/gas pains  Plan: Discharge home Rx Mylicon for gas pains Advised to watch for worsening of pain, fever, vomiting or  diarrhea Preterm Labor precautions Follow up in Office for prenatal visits and recheck of status  Encouraged to return here or to other Urgent Care/ED if she develops worsening of symptoms, increase in pain, fever, or other concerning symptoms.   Pt stable at time of discharge.  Wynelle Bourgeois CNM, MSN Certified Nurse-Midwife 02/06/2018 11:20 PM

## 2018-02-07 DIAGNOSIS — R109 Unspecified abdominal pain: Secondary | ICD-10-CM | POA: Diagnosis not present

## 2018-02-07 DIAGNOSIS — O26891 Other specified pregnancy related conditions, first trimester: Secondary | ICD-10-CM

## 2018-02-07 DIAGNOSIS — O26892 Other specified pregnancy related conditions, second trimester: Secondary | ICD-10-CM | POA: Diagnosis not present

## 2018-02-07 LAB — COMPREHENSIVE METABOLIC PANEL
ALBUMIN: 3.1 g/dL — AB (ref 3.5–5.0)
ALK PHOS: 57 U/L (ref 38–126)
ALT: 15 U/L (ref 14–54)
AST: 16 U/L (ref 15–41)
Anion gap: 9 (ref 5–15)
BILIRUBIN TOTAL: 0.3 mg/dL (ref 0.3–1.2)
BUN: 10 mg/dL (ref 6–20)
CALCIUM: 8.5 mg/dL — AB (ref 8.9–10.3)
CO2: 22 mmol/L (ref 22–32)
CREATININE: 0.6 mg/dL (ref 0.44–1.00)
Chloride: 106 mmol/L (ref 101–111)
GFR calc Af Amer: >60 mL/min (ref >60)
GLUCOSE: 106 mg/dL — AB (ref 65–99)
Potassium: 3.9 mmol/L (ref 3.5–5.1)
Sodium: 137 mmol/L (ref 135–145)
TOTAL PROTEIN: 6.6 g/dL (ref 6.5–8.1)

## 2018-02-07 LAB — CBC WITH DIFFERENTIAL/PLATELET
BASOS ABS: 0 10*3/uL (ref 0.0–0.1)
BASOS PCT: 0 %
Eosinophils Absolute: 0.1 10*3/uL (ref 0.0–0.7)
Eosinophils Relative: 1 %
HEMATOCRIT: 33.2 % — AB (ref 36.0–46.0)
HEMOGLOBIN: 10.5 g/dL — AB (ref 12.0–15.0)
LYMPHS PCT: 49 %
Lymphs Abs: 4.4 10*3/uL — ABNORMAL HIGH (ref 0.7–4.0)
MCH: 24.2 pg — ABNORMAL LOW (ref 26.0–34.0)
MCHC: 31.6 g/dL (ref 30.0–36.0)
MCV: 76.5 fL — AB (ref 78.0–100.0)
MONO ABS: 0.4 10*3/uL (ref 0.1–1.0)
MONOS PCT: 4 %
NEUTROS ABS: 4.1 10*3/uL (ref 1.7–7.7)
NEUTROS PCT: 46 %
Platelets: 275 10*3/uL (ref 150–400)
RBC: 4.34 MIL/uL (ref 3.87–5.11)
RDW: 16.4 % — ABNORMAL HIGH (ref 11.5–15.5)
WBC: 8.9 10*3/uL (ref 4.0–10.5)

## 2018-02-07 LAB — INHERITEST CORE(CF97,SMA,FRAX)

## 2018-02-07 MED ORDER — SIMETHICONE 80 MG PO CHEW: 80.0000 mg | CHEWABLE_TABLET | Freq: Four times a day (QID) | ORAL | 0 refills | Status: DC | PRN

## 2018-02-07 NOTE — Discharge Instructions (Signed)
Abdominal Bloating When you have abdominal bloating, your abdomen may feel full, tight, or painful. It may also look bigger than normal or swollen (distended). Common causes of abdominal bloating include:  Swallowing air.  Constipation.  Problems digesting food.  Eating too much.  Irritable bowel syndrome. This is a condition that affects the large intestine.  Lactose intolerance. This is an inability to digest lactose, a natural sugar in dairy products.  Celiac disease. This is a condition that affects the ability to digest gluten, a protein found in some grains.  Gastroparesis. This is a condition that slows down the movement of food in the stomach and small intestine. It is more common in people with diabetes mellitus.  Gastroesophageal reflux disease (GERD). This is a digestive condition that makes stomach acid flow back into the esophagus.  Urinary retention. This means that the body is holding onto urine, and the bladder cannot be emptied all the way.  Follow these instructions at home: Eating and drinking  Avoid eating too much.  Try not to swallow air while talking or eating.  Avoid eating while lying down.  Avoid these foods and drinks: ? Foods that cause gas, such as broccoli, cabbage, cauliflower, and baked beans. ? Carbonated drinks. ? Hard candy. ? Chewing gum. Medicines  Take over-the-counter and prescription medicines only as told by your health care provider.  Take probiotic medicines. These medicines contain live bacteria or yeasts that can help digestion.  Take coated peppermint oil capsules. Activity  Try to exercise regularly. Exercise may help to relieve bloating that is caused by gas and relieve constipation. General instructions  Keep all follow-up visits as told by your health care provider. This is important. Contact a health care provider if:  You have nausea and vomiting.  You have diarrhea.  You have abdominal pain.  You have  unusual weight loss or weight gain.  You have severe pain, and medicines do not help. Get help right away if:  You have severe chest pain.  You have trouble breathing.  You have shortness of breath.  You have trouble urinating.  You have darker urine than normal.  You have blood in your stools or have dark, tarry stools. Summary  Abdominal bloating means that the abdomen is swollen.  Common causes of abdominal bloating are swallowing air, constipation, and problems digesting food.  Avoid eating too much and avoid swallowing air.  Avoid foods that cause gas, carbonated drinks, hard candy, and chewing gum. This information is not intended to replace advice given to you by your health care provider. Make sure you discuss any questions you have with your health care provider. Document Released: 09/14/2016 Document Revised: 09/14/2016 Document Reviewed: 09/14/2016 Elsevier Interactive Patient Education  2018 [employer]   Abdominal Pain During Pregnancy Belly (abdominal) pain is common during pregnancy. Most of the time, it is not a serious problem. Other times, it can be a sign that something is wrong with the pregnancy. Always tell your doctor if you have belly pain. Follow these instructions at home: Monitor your belly pain for any changes. The following actions may help you feel better:  Do not have sex (intercourse) or put anything in your vagina until you feel better.  Rest until your pain stops.  Drink clear fluids if you feel sick to your stomach (nauseous). Do not eat solid food until you feel better.  Only take medicine as told by your doctor.  Keep all doctor visits as told.  Get help right  away if:  You are bleeding, leaking fluid, or pieces of tissue come out of your vagina.  You have more pain or cramping.  You keep throwing up (vomiting).  You have pain when you pee (urinate) or have blood in your pee.  You have a fever.  You do not feel your baby  moving as much.  You feel very weak or feel like passing out.  You have trouble breathing, with or without belly pain.  You have a very bad headache and belly pain.  You have fluid leaking from your vagina and belly pain.  You keep having watery poop (diarrhea).  Your belly pain does not go away after resting, or the pain gets worse. This information is not intended to replace advice given to you by your health care provider. Make sure you discuss any questions you have with your health care provider. Document Released: 08/01/2009 Document Revised: 03/21/2016 Document Reviewed: 03/12/2013 Elsevier Interactive Patient Education  [employer]2018 [employer]

## 2018-02-09 ENCOUNTER — Other Ambulatory Visit: Payer: Self-pay

## 2018-02-09 ENCOUNTER — Inpatient Hospital Stay (HOSPITAL_COMMUNITY)
Admission: AD | Admit: 2018-02-09 | Discharge: 2018-02-10 | Disposition: A | Discharge status: Home / Self Care | Payer: 59 | Source: Ambulatory Visit | Attending: Obstetrics and Gynecology | Admitting: Obstetrics and Gynecology

## 2018-02-09 ENCOUNTER — Encounter (HOSPITAL_COMMUNITY): Payer: Self-pay | Admitting: *Deleted

## 2018-02-09 DIAGNOSIS — O219 Vomiting of pregnancy, unspecified: Secondary | ICD-10-CM | POA: Diagnosis not present

## 2018-02-09 DIAGNOSIS — O26892 Other specified pregnancy related conditions, second trimester: Secondary | ICD-10-CM | POA: Diagnosis present

## 2018-02-09 DIAGNOSIS — R109 Unspecified abdominal pain: Secondary | ICD-10-CM | POA: Diagnosis not present

## 2018-02-09 DIAGNOSIS — M549 Dorsalgia, unspecified: Secondary | ICD-10-CM | POA: Diagnosis not present

## 2018-02-09 DIAGNOSIS — Z79899 Other long term (current) drug therapy: Secondary | ICD-10-CM | POA: Diagnosis not present

## 2018-02-09 DIAGNOSIS — Z9889 Other specified postprocedural states: Secondary | ICD-10-CM | POA: Diagnosis not present

## 2018-02-09 DIAGNOSIS — O2342 Unspecified infection of urinary tract in pregnancy, second trimester: Secondary | ICD-10-CM | POA: Diagnosis not present

## 2018-02-09 DIAGNOSIS — Z3A16 16 weeks gestation of pregnancy: Secondary | ICD-10-CM | POA: Diagnosis not present

## 2018-02-09 DIAGNOSIS — Z8659 Personal history of other mental and behavioral disorders: Secondary | ICD-10-CM | POA: Insufficient documentation

## 2018-02-09 DIAGNOSIS — O26891 Other specified pregnancy related conditions, first trimester: Secondary | ICD-10-CM

## 2018-02-09 DIAGNOSIS — Z888 Allergy status to other drugs, medicaments and biological substances status: Secondary | ICD-10-CM | POA: Insufficient documentation

## 2018-02-09 LAB — URINALYSIS, ROUTINE W REFLEX MICROSCOPIC
BILIRUBIN URINE: NEGATIVE
GLUCOSE, UA: NEGATIVE mg/dL
Hgb urine dipstick: NEGATIVE
Ketones, ur: NEGATIVE mg/dL
NITRITE: NEGATIVE
PROTEIN: 100 mg/dL — AB
SPECIFIC GRAVITY, URINE: 1.018 (ref 1.005–1.030)
WBC, UA: >50 WBC/hpf — ABNORMAL HIGH (ref 0–5)
pH: 7 (ref 5.0–8.0)

## 2018-02-09 MED ORDER — HYDROMORPHONE HCL 1 MG/ML IJ SOLN
1.0000 mg | Freq: Once | INTRAMUSCULAR | Status: AC
  Administered 2018-02-09: 1 mg via INTRAMUSCULAR
  Filled 2018-02-09: qty 1

## 2018-02-09 NOTE — MAU Provider Note (Signed)
Chief Complaint: Abdominal Pain; Back Pain; and Nausea   First Provider Initiated Contact with Patient 02/09/18 2344      SUBJECTIVE HPI: Traci Castro is a 22 y.o. G3P1011 at 4751w1d by LMP who presents to maternity admissions reporting severe lower abdominal pain. The pain started yesterday, is intermittent cramping lower abdominal pain, has worsened today, and is associated with nausea with vomiting x 3 in 24 hours. The pain radiates to her lower back. The pain is central in her abdomen, not more on the right or left and her back pain is bilateral. She denies any other associated symptoms. She has tried warm bath, hot shower, rest, increased PO fluids but the pain is worsening.   She denies vaginal bleeding, vaginal itching/burning, urinary symptoms, h/a, dizziness, or fever/chills.     HPI  Past Medical History:  Diagnosis Date  . Anemia   . Anxiety    no meds  . Bipolar disorder (HCC)   . Depression    no meds  . Gastritis    2 bouts  . H/O seasonal allergies   . Migraine   . Psoriasis   . SVD (spontaneous vaginal delivery)    x 1  . Vision abnormalities    wears glasses   Past Surgical History:  Procedure Laterality Date  . DILATION AND CURETTAGE OF UTERUS    . DILATION AND EVACUATION N/A 09/20/2017   Procedure: SUCTION DILATATION AND EVACUATION;  Surgeon: Salem BingPickens, Charlie, MD;  Location: [facility];  Service: Gynecology;  Laterality: N/A;  . ESOPHAGOGASTRODUODENOSCOPY N/A 10/17/2012   Procedure: ESOPHAGOGASTRODUODENOSCOPY (EGD);  Surgeon: Jon GillsJoseph H Clark, MD;  [facility];  Service: Gastroenterology;  Laterality: N/A;  . THERAPEUTIC ABORTION  09/16/2017   Clinic  . WISDOM TOOTH EXTRACTION     Social History   Socioeconomic History  . Marital status: Single    Spouse name: Not on file  . Number of children: Not on file  . Years of education: Not on file  . Highest education level: Not on file  Occupational History  . Not on file  Social Needs  . Financial resource  strain: Not on file  . Food insecurity:    Worry: Not on file    Inability: Not on file  . Transportation needs:    Medical: Not on file    Non-medical: Not on file  Tobacco Use  . Smoking status: Never Smoker  . Smokeless tobacco: Never Used  Substance and Sexual Activity  . Alcohol use: No  . Drug use: No  . Sexual activity: Yes    Birth control/protection: None  Lifestyle  . Physical activity:    Days per week: Not on file    Minutes per session: Not on file  . Stress: Not on file  Relationships  . Social connections:    Talks on phone: Not on file    Gets together: Not on file    Attends religious service: Not on file    Active member of club or organization: Not on file    Attends meetings of clubs or organizations: Not on file    Relationship status: Not on file  . Intimate partner violence:    Fear of current or ex partner: Not on file    Emotionally abused: Not on file    Physically abused: Not on file    Forced sexual activity: Not on file  Other Topics Concern  . Not on file  Social History Narrative   10th grade  No current facility-administered medications on file prior to encounter.    Current Outpatient Medications on File Prior to Encounter  Medication Sig Dispense Refill  . acetaminophen (TYLENOL) 500 MG tablet Take 500 mg by mouth every 6 (six) hours as needed.    . Doxylamine-Pyridoxine (DICLEGIS) 10-10 MG TBEC Take 1 tablet with breakfast and lunch.  Take 2 tablets at bedtime. 100 tablet 4  . ondansetron (ZOFRAN ODT) 4 MG disintegrating tablet Take 1 tablet (4 mg total) by mouth every 8 (eight) hours as needed for nausea or vomiting. 20 tablet 0  . Prenatal Vit-Fe Phos-FA-Omega (VITAFOL GUMMIES) 3.33-0.333-34.8 MG CHEW Chew 3 tablets by mouth at bedtime. 90 tablet 12  . simethicone (GAS-X) 80 MG chewable tablet Chew 1 tablet (80 mg total) by mouth every 6 (six) hours as needed for flatulence. 30 tablet 0  . Vitamin D, Ergocalciferol, (DRISDOL) 50000  units CAPS capsule Take 1 capsule (50,000 Units total) by mouth every 7 (seven) days. 30 capsule 2   Allergies  Allergen Reactions  . Chlorhexidine Itching and Rash    Severe itching, redness.   . Tramadol Nausea And Vomiting    ROS:  Review of Systems  Constitutional: Negative for chills, fatigue and fever.  Respiratory: Negative for shortness of breath.   Cardiovascular: Negative for chest pain.  Gastrointestinal: Positive for abdominal pain, nausea and vomiting. Negative for constipation and diarrhea.  Genitourinary: Negative for difficulty urinating, dysuria, flank pain, pelvic pain, vaginal bleeding, vaginal discharge and vaginal pain.  Musculoskeletal: Positive for back pain.  Neurological: Negative for dizziness and headaches.  Psychiatric/Behavioral: Negative.      I have reviewed patient's Past Medical Hx, Surgical Hx, Family Hx, Social Hx, medications and allergies.   Physical Exam   Patient Vitals for the past 24 hrs:  BP Temp Temp src Pulse Resp Height Weight  02/10/18 0254 120/64 98.1 F (36.7 C) Oral 87 18 - -  02/09/18 2125 119/68 98.3 F (36.8 C) Oral 79 19 5\' 3"  (1.6 m) 282 lb (127.9 kg)   Constitutional: Well-developed, well-nourished female in moderate distress.  Cardiovascular: normal rate Respiratory: normal effort GI: Abd soft, non-tender. Pos BS x 4 MS: Extremities nontender, no edema, normal ROM Neurologic: Alert and oriented x 4.  GU: Neg CVAT.  PELVIC EXAM: Blind swab with wet prep, GCC collected  Cervix closed/thick/posterior, firm  FHT 155 by doppler  LAB RESULTS Results for orders placed or performed during the hospital encounter of 02/09/18 (from the past 24 hour(s))  Urinalysis, Routine w reflex microscopic     Status: Abnormal   Collection Time: 02/09/18 10:00 PM  Result Value Ref Range   Color, Urine YELLOW YELLOW   APPearance CLOUDY (A) CLEAR   Specific Gravity, Urine 1.018 1.005 - 1.030   pH 7.0 5.0 - 8.0   Glucose, UA  NEGATIVE NEGATIVE mg/dL   Hgb urine dipstick NEGATIVE NEGATIVE   Bilirubin Urine NEGATIVE NEGATIVE   Ketones, ur NEGATIVE NEGATIVE mg/dL   Protein, ur 161 (A) NEGATIVE mg/dL   Nitrite NEGATIVE NEGATIVE   Leukocytes, UA LARGE (A) NEGATIVE   RBC / HPF 0-5 0 - 5 RBC/hpf   WBC, UA >50 (H) 0 - 5 WBC/hpf   Bacteria, UA RARE (A) NONE SEEN   Squamous Epithelial / LPF 6-10 0 - 5   Mucus PRESENT    Non Squamous Epithelial 0-5 (A) NONE SEEN  Wet prep, genital     Status: Abnormal   Collection Time: 02/09/18 11:50 PM  Result  Value Ref Range   Yeast Wet Prep HPF POC NONE SEEN NONE SEEN   Trich, Wet Prep NONE SEEN NONE SEEN   Clue Cells Wet Prep HPF POC NONE SEEN NONE SEEN   WBC, Wet Prep HPF POC FEW (A) NONE SEEN   Sperm NONE SEEN   CBC     Status: Abnormal   Collection Time: 02/09/18 11:57 PM  Result Value Ref Range   WBC 10.0 4.0 - 10.5 K/uL   RBC 4.55 3.87 - 5.11 MIL/uL   Hemoglobin 11.0 (L) 12.0 - 15.0 g/dL   HCT 16.1 (L) 09.6 - 04.5 %   MCV 76.0 (L) 78.0 - 100.0 fL   MCH 24.2 (L) 26.0 - 34.0 pg   MCHC 31.8 30.0 - 36.0 g/dL   RDW 40.9 (H) 81.1 - 91.4 %   Platelets 255 150 - 400 K/uL  Comprehensive metabolic panel     Status: Abnormal   Collection Time: 02/09/18 11:57 PM  Result Value Ref Range   Sodium 135 135 - 145 mmol/L   Potassium 4.0 3.5 - 5.1 mmol/L   Chloride 104 101 - 111 mmol/L   CO2 22 22 - 32 mmol/L   Glucose, Bld 94 65 - 99 mg/dL   BUN 10 6 - 20 mg/dL   Creatinine, Ser 7.82 0.44 - 1.00 mg/dL   Calcium 8.7 (L) 8.9 - 10.3 mg/dL   Total Protein 6.7 6.5 - 8.1 g/dL   Albumin 3.4 (L) 3.5 - 5.0 g/dL   AST 14 (L) 15 - 41 U/L   ALT 14 14 - 54 U/L   Alkaline Phosphatase 68 38 - 126 U/L   Total Bilirubin 0.5 0.3 - 1.2 mg/dL   GFR calc non Af Amer >60 >60 mL/min   GFR calc Af Amer >60 >60 mL/min   Anion gap 9 5 - 15  Lipase, blood     Status: None   Collection Time: 02/09/18 11:57 PM  Result Value Ref Range   Lipase 45 11 - 51 U/L    A/Positive/-- (06/04  1521)  IMAGING US Ob Comp Less 14 Wks  Result Date: 02/10/2018 CLINICAL DATA:  Abdominal pain. Gestational age by last menstrual period 16 weeks and 2 days, however unknown exact last menstrual period. EXAM: OBSTETRIC <14 WK ULTRASOUND TECHNIQUE: Transabdominal ultrasound was performed for evaluation of the gestation as well as the maternal uterus and adnexal regions. COMPARISON:  None. FINDINGS: Intrauterine gestational sac: Present Yolk sac:  Not present Embryo:  Present Cardiac Activity: Present Heart Rate: 140 bpm CRL:   70.3 mm   13 w 1 d                  [place] EDC: August 17, 2018 Subchorionic hemorrhage:  None visualized. Maternal uterus/adnexae: Normal appearance of the adnexa. 2.9 cm anechoic cyst/follicle LEFT ovary. No free fluid. IMPRESSION: Single live intrauterine pregnancy, gestational age by ultrasound 13 weeks 1 day (discrepant dates). No immediate complication. Electronically Signed   By: Awilda Metro M.D.   On: 02/10/2018 02:31    MAU Management/MDM: Pt with significant pain, unable to find comfort in MAU bed, rocking with pain.  Cervix closed/thick/posterior so no signs of preterm labor.  Dilaudid 1 mg IM given for pain with complete resolution initially, some return of pain prior to discharge. UA with large leukocytes, otherwise wnl so possible UTI.  CBC, CMP, lipase ordered and results wnl.  Wil treat for UTI, urine culture ordered.  Keflex QID.  Rest/ice/heat/warm bath/Tylenol for  pain. Add Zantac BID prn.  F/U with Dr Senaida Ores this week. Return to MAU as needed for emergencies.  Pt discharged with strict pain precautions.  ASSESSMENT 1. UTI (urinary tract infection) during pregnancy, second trimester   2. Abdominal pain during pregnancy in first trimester     PLAN Discharge home Allergies as of 02/10/2018      Reactions   Chlorhexidine Itching, Rash   Severe itching, redness.    Tramadol Nausea And Vomiting      Medication List    STOP taking these medications    metroNIDAZOLE 0.75 % vaginal gel Commonly known as:  METROGEL VAGINAL   terconazole 0.8 % vaginal cream Commonly known as:  TERAZOL 3     TAKE these medications   acetaminophen 500 MG tablet Commonly known as:  TYLENOL Take 500 mg by mouth every 6 (six) hours as needed.   cephALEXin 500 MG capsule Commonly known as:  KEFLEX Take 1 capsule (500 mg total) by mouth 4 (four) times daily.   Doxylamine-Pyridoxine 10-10 MG Tbec Commonly known as:  DICLEGIS Take 1 tablet with breakfast and lunch.  Take 2 tablets at bedtime.   ondansetron 4 MG disintegrating tablet Commonly known as:  ZOFRAN ODT Take 1 tablet (4 mg total) by mouth every 8 (eight) hours as needed for nausea or vomiting.   ranitidine 150 MG tablet Commonly known as:  ZANTAC Take 1 tablet (150 mg total) by mouth 2 (two) times daily.   simethicone 80 MG chewable tablet Commonly known as:  GAS-X Chew 1 tablet (80 mg total) by mouth every 6 (six) hours as needed for flatulence.   VITAFOL GUMMIES 3.33-0.333-34.8 MG Chew Chew 3 tablets by mouth at bedtime.   Vitamin D (Ergocalciferol) 50000 units Caps capsule Commonly known as:  DRISDOL Take 1 capsule (50,000 Units total) by mouth every 7 (seven) days.      Follow-up Information    [facility] Follow up.   Why:  As scheduled, return to MAU as needed for emergencies. Contact information: [address] [place] [address] [phone]          Sharen Counter Certified Nurse-Midwife 02/10/2018  7:32 AM

## 2018-02-09 NOTE — MAU Note (Signed)
Pt presents to MAU c/o constant lower abdominal pain and back pain. Pt states it is a cramping feeling that goes all the way across her lower abdomen. Pt states it started this morning about 0700 and has since worsened. Pt denies bleeding, LOF, or vaginal discharge. Pt also reports nausea that has been occurring pt states she is taking zofran for this problem.

## 2018-02-10 ENCOUNTER — Inpatient Hospital Stay (HOSPITAL_COMMUNITY): Payer: 59

## 2018-02-10 DIAGNOSIS — O2342 Unspecified infection of urinary tract in pregnancy, second trimester: Secondary | ICD-10-CM

## 2018-02-10 DIAGNOSIS — R109 Unspecified abdominal pain: Secondary | ICD-10-CM

## 2018-02-10 DIAGNOSIS — O26891 Other specified pregnancy related conditions, first trimester: Secondary | ICD-10-CM | POA: Diagnosis not present

## 2018-02-10 LAB — COMPREHENSIVE METABOLIC PANEL
ALBUMIN: 3.4 g/dL — AB (ref 3.5–5.0)
ALK PHOS: 68 U/L (ref 38–126)
ALT: 14 U/L (ref 14–54)
AST: 14 U/L — AB (ref 15–41)
Anion gap: 9 (ref 5–15)
BILIRUBIN TOTAL: 0.5 mg/dL (ref 0.3–1.2)
BUN: 10 mg/dL (ref 6–20)
CALCIUM: 8.7 mg/dL — AB (ref 8.9–10.3)
CO2: 22 mmol/L (ref 22–32)
Chloride: 104 mmol/L (ref 101–111)
Creatinine, Ser: 0.56 mg/dL (ref 0.44–1.00)
GFR calc Af Amer: >60 mL/min (ref >60)
GFR calc non Af Amer: >60 mL/min (ref >60)
GLUCOSE: 94 mg/dL (ref 65–99)
Potassium: 4 mmol/L (ref 3.5–5.1)
Sodium: 135 mmol/L (ref 135–145)
TOTAL PROTEIN: 6.7 g/dL (ref 6.5–8.1)

## 2018-02-10 LAB — CBC
HEMATOCRIT: 34.6 % — AB (ref 36.0–46.0)
HEMOGLOBIN: 11 g/dL — AB (ref 12.0–15.0)
MCH: 24.2 pg — ABNORMAL LOW (ref 26.0–34.0)
MCHC: 31.8 g/dL (ref 30.0–36.0)
MCV: 76 fL — ABNORMAL LOW (ref 78.0–100.0)
Platelets: 255 10*3/uL (ref 150–400)
RBC: 4.55 MIL/uL (ref 3.87–5.11)
RDW: 16.5 % — ABNORMAL HIGH (ref 11.5–15.5)
WBC: 10 10*3/uL (ref 4.0–10.5)

## 2018-02-10 LAB — WET PREP, GENITAL
Clue Cells Wet Prep HPF POC: NONE SEEN
Sperm: NONE SEEN
Trich, Wet Prep: NONE SEEN
Yeast Wet Prep HPF POC: NONE SEEN

## 2018-02-10 LAB — LIPASE, BLOOD: Lipase: 45 U/L (ref 11–51)

## 2018-02-10 LAB — GC/CHLAMYDIA PROBE AMP (~~LOC~~) NOT AT ARMC
Chlamydia: NEGATIVE
NEISSERIA GONORRHEA: NEGATIVE

## 2018-02-10 MED ORDER — RANITIDINE HCL 150 MG PO TABS: 150.0000 mg | ORAL_TABLET | Freq: Two times a day (BID) | ORAL | 5 refills | Status: DC

## 2018-02-10 MED ORDER — CEPHALEXIN 500 MG PO CAPS: 500.0000 mg | ORAL_CAPSULE | Freq: Four times a day (QID) | ORAL | 2 refills | Status: DC

## 2018-02-10 MED ORDER — ONDANSETRON 8 MG PO TBDP
8.0000 mg | ORAL_TABLET | Freq: Once | ORAL | Status: AC
  Administered 2018-02-10: 8 mg via ORAL
  Filled 2018-02-10: qty 1

## 2018-02-11 ENCOUNTER — Other Ambulatory Visit: Payer: Self-pay | Admitting: Certified Nurse Midwife

## 2018-02-11 DIAGNOSIS — Z3480 Encounter for supervision of other normal pregnancy, unspecified trimester: Secondary | ICD-10-CM

## 2018-02-17 ENCOUNTER — Telehealth: Payer: Self-pay

## 2018-02-17 NOTE — Telephone Encounter (Signed)
S/w patient to follow up after she called after hours line to report abdominal pain. Patient states that she did not go to hospital, and that she is no longer in pain.

## 2018-02-18 ENCOUNTER — Encounter: Payer: Self-pay | Admitting: *Deleted

## 2018-02-25 ENCOUNTER — Ambulatory Visit (INDEPENDENT_AMBULATORY_CARE_PROVIDER_SITE_OTHER): Payer: 59 | Admitting: Certified Nurse Midwife

## 2018-02-25 ENCOUNTER — Encounter: Payer: Self-pay | Admitting: Certified Nurse Midwife

## 2018-02-25 VITALS — BP 137/75 | HR 79 | Wt 277.0 lb

## 2018-02-25 DIAGNOSIS — O9989 Other specified diseases and conditions complicating pregnancy, childbirth and the puerperium: Secondary | ICD-10-CM

## 2018-02-25 DIAGNOSIS — O09899 Supervision of other high risk pregnancies, unspecified trimester: Secondary | ICD-10-CM

## 2018-02-25 DIAGNOSIS — Z283 Underimmunization status: Secondary | ICD-10-CM

## 2018-02-25 DIAGNOSIS — E559 Vitamin D deficiency, unspecified: Secondary | ICD-10-CM

## 2018-02-25 DIAGNOSIS — Z3480 Encounter for supervision of other normal pregnancy, unspecified trimester: Secondary | ICD-10-CM

## 2018-02-25 NOTE — Progress Notes (Signed)
   PRENATAL VISIT NOTE  Subjective:  Traci Castro is a 22 y.o. G3P1011 at 64w2dbeing seen today for ongoing prenatal care.  She is currently monitored for the following issues for this low-risk pregnancy and has BMI 45.0-49.9, [place] ([place]; Chlamydia; [name], antepartum; [name], antepartum; and [name] on their problem list.  Patient reports no bleeding, no contractions, no leaking and urinary symptoms; continued, is not drinking any water.  Discussed 8 bottles/day.  Contractions: Not present. Vag. Bleeding: None.  Movement: Present. Denies leaking of fluid.   The following portions of the patient's history were reviewed and updated as appropriate: allergies, current medications, past family history, past medical history, past social history, past surgical history and problem list. Problem list updated.  Objective:   Vitals:   02/25/18 1336  BP: 137/75  Pulse: 79  Weight: 277 lb (125.6 kg)    Fetal Status: Fetal Heart Rate (bpm): 143; doppler   Movement: Present     General:  Alert, oriented and cooperative. Patient is in no acute distress.  Skin: Skin is warm and dry. No rash noted.   Cardiovascular: Normal heart rate noted  Respiratory: Normal respiratory effort, no problems with respiration noted  Abdomen: Soft, gravid, appropriate for gestational age.  Pain/Pressure: Present     Pelvic: Cervical exam deferred        Extremities: Normal range of motion.  Edema: None  Mental Status: Normal mood and affect. Normal behavior. Normal judgment and thought content.   Assessment and Plan:  Pregnancy: G3P1011 at 172w2d1. [name], antepartum      - HIV antibody - AFP, Serum, Open Spina Bifida - [place] OB DETAIL +14 WK; Future  2. [name], antepartum     MMR postpartum  3. [name]     Taking weekly vitamin D.  Preterm labor symptoms and general obstetric  precautions including but not limited to vaginal bleeding, contractions, leaking of fluid and fetal movement were reviewed in detail with the patient. Please refer to After Visit Summary for other counseling recommendations.  Return in about 1 month (around 03/25/2018) for [place] Future Appointments  Date Time Provider [place]7/22/2019  2:45 PM [facility] [facility]  03/25/2018  2:00 PM ArWoodroe ModeMD [place]    RaMorene CrockerCNM

## 2018-02-25 NOTE — Progress Notes (Signed)
Pt c/o dysuria states she has also had MAU visits due to pelvic pain.  Pt made aware to leave sample before leaving pt cannot void at this time.

## 2018-02-26 LAB — HIV ANTIBODY (ROUTINE TESTING W REFLEX): HIV Screen 4th Generation wRfx: NONREACTIVE

## 2018-03-01 LAB — AFP, SERUM, OPEN SPINA BIFIDA
AFP MoM: 2.24
AFP VALUE AFPOSL: 50.8 ng/mL
GEST. AGE ON COLLECTION DATE: 15.3 wk
MATERNAL AGE AT EDD: 22.8 a
OSBR Risk 1 IN: 963
TEST RESULTS AFP: NEGATIVE
Weight: 277 [lb_av]

## 2018-03-05 ENCOUNTER — Other Ambulatory Visit: Payer: Self-pay | Admitting: Certified Nurse Midwife

## 2018-03-05 DIAGNOSIS — Z3480 Encounter for supervision of other normal pregnancy, unspecified trimester: Secondary | ICD-10-CM

## 2018-03-10 ENCOUNTER — Telehealth: Payer: Self-pay

## 2018-03-10 ENCOUNTER — Encounter (HOSPITAL_COMMUNITY): Payer: Self-pay

## 2018-03-10 NOTE — Telephone Encounter (Signed)
Pt called and would like to know if she can get an additional letter for work stating that she can have an extra 15 min break other than her break she already gets every 2 hours. Also stating that if she needs to leave her shift early because of nausea and other pregnancy symptoms that they can not "hold it against her" Pt states she works at [employer] and 4 of her co workers have "lost their baby because of too much stress". I advised pt that I was not authorized to do this for her, but said that I would consult a provider about this matter.

## 2018-03-11 NOTE — Telephone Encounter (Signed)
We can't authorize this request.

## 2018-03-17 ENCOUNTER — Other Ambulatory Visit: Payer: Self-pay | Admitting: Certified Nurse Midwife

## 2018-03-17 ENCOUNTER — Encounter (HOSPITAL_COMMUNITY): Payer: Self-pay

## 2018-03-17 ENCOUNTER — Ambulatory Visit (HOSPITAL_COMMUNITY)
Admission: RE | Admit: 2018-03-17 | Discharge: 2018-03-17 | Disposition: A | Discharge status: Home / Self Care | Payer: 59 | Source: Ambulatory Visit | Attending: Certified Nurse Midwife | Admitting: Certified Nurse Midwife

## 2018-03-17 DIAGNOSIS — O99212 Obesity complicating pregnancy, second trimester: Secondary | ICD-10-CM

## 2018-03-17 DIAGNOSIS — O99012 Anemia complicating pregnancy, second trimester: Secondary | ICD-10-CM | POA: Diagnosis not present

## 2018-03-17 DIAGNOSIS — Z3A18 18 weeks gestation of pregnancy: Secondary | ICD-10-CM

## 2018-03-17 DIAGNOSIS — Z3480 Encounter for supervision of other normal pregnancy, unspecified trimester: Secondary | ICD-10-CM

## 2018-03-17 DIAGNOSIS — Z363 Encounter for antenatal screening for malformations: Secondary | ICD-10-CM | POA: Diagnosis not present

## 2018-03-18 ENCOUNTER — Other Ambulatory Visit (HOSPITAL_COMMUNITY): Payer: Self-pay | Admitting: *Deleted

## 2018-03-18 DIAGNOSIS — O99210 Obesity complicating pregnancy, unspecified trimester: Secondary | ICD-10-CM

## 2018-03-25 ENCOUNTER — Ambulatory Visit (INDEPENDENT_AMBULATORY_CARE_PROVIDER_SITE_OTHER): Payer: 59 | Admitting: Obstetrics & Gynecology

## 2018-03-25 ENCOUNTER — Encounter: Payer: Self-pay | Admitting: Obstetrics & Gynecology

## 2018-03-25 DIAGNOSIS — Z3480 Encounter for supervision of other normal pregnancy, unspecified trimester: Secondary | ICD-10-CM

## 2018-03-25 NOTE — Progress Notes (Signed)
   PRENATAL VISIT NOTE  Subjective:  Traci Castro is a 22 y.o. G3P1011 at 3020w2d being seen today for ongoing prenatal care.  She is currently monitored for the following issues for this low-risk pregnancy and has BMI 45.0-49.9, adult (HCC); Chlamydia; [name], antepartum; [name], antepartum; and [name] on their problem list.  Patient reports she had an episode at work difficulty standing no SOB CP LOC, lasted 5 minutes.  Contractions: Not present. Vag. Bleeding: None.  Movement: Present. Denies leaking of fluid.   The following portions of the patient's history were reviewed and updated as appropriate: allergies, current medications, past family history, past medical history, past social history, past surgical history and problem list. Problem list updated.  Objective:   Vitals:   03/25/18 1336  BP: 118/78  Pulse: 93  Weight: 279 lb 9.6 oz (126.8 kg)    Fetal Status: Fetal Heart Rate (bpm): 143 Fundal Height: 20 cm Movement: Present     General:  Alert, oriented and cooperative. Patient is in no acute distress.  Skin: Skin is warm and dry. No rash noted.   Cardiovascular: Normal heart rate noted  Respiratory: Normal respiratory effort, no problems with respiration noted  Abdomen: Soft, gravid, appropriate for gestational age.  Pain/Pressure: Present     Pelvic: Cervical exam deferred        Extremities: Normal range of motion.  Edema: None  Mental Status: Normal mood and affect. Normal behavior. Normal judgment and thought content.   Assessment and Plan:  Pregnancy: G3P1011 at 5520w2d  1. [name], antepartum Looks well, had episode of possible dizziness or weakness, fully resolved  Preterm labor symptoms and general obstetric precautions including but not limited to vaginal bleeding, contractions, leaking of fluid and fetal movement were reviewed in detail with the patient. Please refer to  After Visit Summary for other counseling recommendations.  Return in about 1 month (around 04/22/2018).  Future Appointments  Date Time Provider Department Center  03/25/2018  2:00 PM Adam PhenixArnold, Kingslee Mairena G, MD [facility] None  04/29/2018  1:45 PM [facility] 2 [facility]    Scheryl DarterJames Ronit Cranfield, MD

## 2018-03-30 ENCOUNTER — Other Ambulatory Visit: Payer: Self-pay | Admitting: Advanced Practice Midwife

## 2018-04-11 ENCOUNTER — Encounter (HOSPITAL_COMMUNITY): Payer: Self-pay

## 2018-04-11 ENCOUNTER — Inpatient Hospital Stay (HOSPITAL_COMMUNITY)
Admission: AD | Admit: 2018-04-11 | Discharge: 2018-04-11 | Disposition: A | Discharge status: Home / Self Care | Payer: 59 | Source: Ambulatory Visit | Attending: Obstetrics and Gynecology | Admitting: Obstetrics and Gynecology

## 2018-04-11 ENCOUNTER — Other Ambulatory Visit: Payer: Self-pay

## 2018-04-11 DIAGNOSIS — Z3A21 21 weeks gestation of pregnancy: Secondary | ICD-10-CM | POA: Diagnosis not present

## 2018-04-11 DIAGNOSIS — O26892 Other specified pregnancy related conditions, second trimester: Secondary | ICD-10-CM | POA: Diagnosis not present

## 2018-04-11 DIAGNOSIS — O26899 Other specified pregnancy related conditions, unspecified trimester: Secondary | ICD-10-CM

## 2018-04-11 DIAGNOSIS — R102 Pelvic and perineal pain: Secondary | ICD-10-CM | POA: Diagnosis not present

## 2018-04-11 DIAGNOSIS — R109 Unspecified abdominal pain: Secondary | ICD-10-CM | POA: Diagnosis present

## 2018-04-11 DIAGNOSIS — Z3480 Encounter for supervision of other normal pregnancy, unspecified trimester: Secondary | ICD-10-CM

## 2018-04-11 LAB — URINALYSIS, ROUTINE W REFLEX MICROSCOPIC
Bilirubin Urine: NEGATIVE
Glucose, UA: NEGATIVE mg/dL
Hgb urine dipstick: NEGATIVE
KETONES UR: NEGATIVE mg/dL
Nitrite: NEGATIVE
PROTEIN: NEGATIVE mg/dL
Specific Gravity, Urine: 1.024 (ref 1.005–1.030)
pH: 6 (ref 5.0–8.0)

## 2018-04-11 LAB — WET PREP, GENITAL
CLUE CELLS WET PREP: NONE SEEN
Sperm: NONE SEEN
TRICH WET PREP: NONE SEEN
YEAST WET PREP: NONE SEEN

## 2018-04-11 MED ORDER — COMFORT FIT MATERNITY SUPP LG MISC: 1.0000 "application " | Freq: Every day | 0 refills | Status: DC

## 2018-04-11 NOTE — MAU Note (Signed)
Pt presents with c/o lower abdominal pain that began Monday.  Denies VB or LOF.  Reports +FM.

## 2018-04-11 NOTE — Discharge Instructions (Signed)

## 2018-04-11 NOTE — MAU Provider Note (Signed)
History     CSN: [id number]  Arrival date and time: 04/11/18 1017   First Provider Initiated Contact with Patient 04/11/18 1132      Chief Complaint  Patient presents with  . Abdominal Pain   G3P1011 @21 .5 wks here with LAP. Pain started 4 days ago. Bilateral, intermittent, and cramping/sharp. Pain is worse with standing, walking, turning over in bed, and fetal movement. Tried Tylenol and had no relief. Denies VB, LOF, or discharge. Feeling FM. No urinary or GI sx.    OB History    Gravida  3   Para  1   Term  1   Preterm      AB  1   Living  1     SAB      TAB  1   Ectopic      Multiple  0   Live Births  1           Past Medical History:  Diagnosis Date  . Anemia   . Anxiety    no meds  . Bipolar disorder (HCC)   . Depression    no meds  . Gastritis    2 bouts  . H/O seasonal allergies   . Migraine    migraines  . Psoriasis   . SVD (spontaneous vaginal delivery)    x 1  . Vision abnormalities    wears glasses    Past Surgical History:  Procedure Laterality Date  . DILATION AND CURETTAGE OF UTERUS    . DILATION AND EVACUATION N/A 09/20/2017   Procedure: SUCTION DILATATION AND EVACUATION;  Surgeon: Frederickson BingPickens, Charlie, MD;  Location: [facility];  Service: Gynecology;  Laterality: N/A;  . ESOPHAGOGASTRODUODENOSCOPY N/A 10/17/2012   Procedure: ESOPHAGOGASTRODUODENOSCOPY (EGD);  Surgeon: Jon GillsJoseph H Clark, MD;  [facility];  Service: Gastroenterology;  Laterality: N/A;  . THERAPEUTIC ABORTION  09/16/2017   Clinic  . WISDOM TOOTH EXTRACTION      Family History  Problem Relation Age of Onset  . Cholelithiasis Mother   . Hyperlipidemia Mother   . Hypertension Mother   . Mental illness Mother   . Cholelithiasis Maternal Uncle   . Hyperlipidemia Maternal Uncle   . Hypertension Maternal Uncle   . Cholelithiasis Maternal Grandmother   . Arthritis Maternal Grandmother   . COPD Maternal Grandmother   . Depression Maternal Grandmother   . Diabetes  Maternal Grandmother   . Heart disease Maternal Grandmother   . Hyperlipidemia Maternal Grandmother   . Hypertension Maternal Grandmother   . Stroke Maternal Grandmother   . Learning disabilities Paternal Grandmother   . Mental illness Paternal Grandmother   . Cancer Paternal Grandmother   . Hyperlipidemia Paternal Grandfather   . Diabetes Paternal Grandfather   . Depression Father   . Hypertension Father   . Ulcers Neg Hx   . Alcohol abuse Neg Hx   . Asthma Neg Hx   . Birth defects Neg Hx   . Drug abuse Neg Hx   . Early death Neg Hx   . Hearing loss Neg Hx   . Kidney disease Neg Hx   . Mental retardation Neg Hx   . Miscarriages / Stillbirths Neg Hx   . Vision loss Neg Hx     Social History   Tobacco Use  . Smoking status: Never Smoker  . Smokeless tobacco: Never Used  Substance Use Topics  . Alcohol use: No  . Drug use: No    Allergies:  Allergies  Allergen Reactions  .  Chlorhexidine Itching and Rash    Severe itching, redness.   . Tramadol Nausea And Vomiting    No medications prior to admission.    Review of Systems  Constitutional: Negative for fever.  Gastrointestinal: Positive for abdominal pain. Negative for constipation, diarrhea, nausea and vomiting.  Genitourinary: Negative for dysuria, hematuria, urgency, vaginal bleeding and vaginal discharge.   Physical Exam   Blood pressure 120/71, pulse 90, temperature 98 F (36.7 C), temperature source Oral, resp. rate 16, height 5\' 3"  (1.6 m), weight 129.6 kg, last menstrual period 10/19/2017, SpO2 100 %, not currently breastfeeding.  Physical Exam  Constitutional: She is oriented to person, place, and time. She appears well-developed and well-nourished. No distress.  HENT:  Head: Normocephalic and atraumatic.  Neck: Normal range of motion.  Respiratory: Effort normal. No respiratory distress.  GI: Soft. She exhibits no distension and no mass. There is no tenderness. There is no rebound and no guarding.   gravid  Genitourinary:  Genitourinary Comments: SVE closed/long  Musculoskeletal: Normal range of motion.  Neurological: She is alert and oriented to person, place, and time.  Skin: Skin is warm and dry.  Psychiatric: She has a normal mood and affect.  FHT: 138  Results for orders placed or performed during the hospital encounter of 04/11/18 (from the past 24 hour(s))  Urinalysis, Routine w reflex microscopic     Status: Abnormal   Collection Time: 04/11/18 11:17 AM  Result Value Ref Range   Color, Urine YELLOW YELLOW   APPearance HAZY (A) CLEAR   Specific Gravity, Urine 1.024 1.005 - 1.030   pH 6.0 5.0 - 8.0   Glucose, UA NEGATIVE NEGATIVE mg/dL   Hgb urine dipstick NEGATIVE NEGATIVE   Bilirubin Urine NEGATIVE NEGATIVE   Ketones, ur NEGATIVE NEGATIVE mg/dL   Protein, ur NEGATIVE NEGATIVE mg/dL   Nitrite NEGATIVE NEGATIVE   Leukocytes, UA TRACE (A) NEGATIVE   RBC / HPF 0-5 0 - 5 RBC/hpf   WBC, UA 11-20 0 - 5 WBC/hpf   Bacteria, UA RARE (A) NONE SEEN   Squamous Epithelial / LPF 11-20 0 - 5   Mucus PRESENT   Wet prep, genital     Status: Abnormal   Collection Time: 04/11/18 11:40 AM  Result Value Ref Range   Yeast Wet Prep HPF POC NONE SEEN NONE SEEN   Trich, Wet Prep NONE SEEN NONE SEEN   Clue Cells Wet Prep HPF POC NONE SEEN NONE SEEN   WBC, Wet Prep HPF POC FEW (A) NONE SEEN   Sperm NONE SEEN    MAU Course  Procedures  MDM Labs ordered and reviewed. No evidence of infection or PTL. Pain likely RL. Discussed comfort measures. Stable for discharge home.  Assessment and Plan   1. [redacted] weeks gestation of pregnancy   2. [name], antepartum   3. Pain of round ligament during pregnancy    Discharge home Follow up in OB office as scheduled Rx Maternity belt PTL precautions  Allergies as of 04/11/2018      Reactions   Chlorhexidine Itching, Rash   Severe itching, redness.    Tramadol Nausea And Vomiting      Medication List    STOP  taking these medications   cephALEXin 500 MG capsule Commonly known as:  KEFLEX   simethicone 80 MG chewable tablet Commonly known as:  MYLICON   Vitamin D (Ergocalciferol) 50000 units Caps capsule Commonly known as:  DRISDOL     TAKE these medications  acetaminophen 500 MG tablet Commonly known as:  TYLENOL Take 500 mg by mouth every 6 (six) hours as needed.   COMFORT FIT MATERNITY SUPP LG Misc 1 application by Does not apply route daily.   Doxylamine-Pyridoxine 10-10 MG Tbec Take 1 tablet with breakfast and lunch.  Take 2 tablets at bedtime.   ondansetron 4 MG disintegrating tablet Commonly known as:  ZOFRAN-ODT Take 1 tablet (4 mg total) by mouth every 8 (eight) hours as needed for nausea or vomiting.   ranitidine 150 MG tablet Commonly known as:  ZANTAC TAKE 1 TABLET BY MOUTH TWICE A DAY   VITAFOL GUMMIES 3.33-0.333-34.8 MG Chew Chew 3 tablets by mouth at bedtime.      Donette LarryMelanie Taler Kushner, CNM 04/11/2018, 12:55 PM

## 2018-04-14 ENCOUNTER — Encounter (HOSPITAL_COMMUNITY): Payer: Self-pay

## 2018-04-14 ENCOUNTER — Inpatient Hospital Stay (HOSPITAL_COMMUNITY)
Admission: AD | Admit: 2018-04-14 | Discharge: 2018-04-15 | Disposition: A | Discharge status: Home / Self Care | Payer: 59 | Source: Ambulatory Visit | Attending: Obstetrics & Gynecology | Admitting: Obstetrics & Gynecology

## 2018-04-14 DIAGNOSIS — O212 Late vomiting of pregnancy: Secondary | ICD-10-CM | POA: Diagnosis not present

## 2018-04-14 DIAGNOSIS — O26892 Other specified pregnancy related conditions, second trimester: Secondary | ICD-10-CM | POA: Diagnosis not present

## 2018-04-14 DIAGNOSIS — R109 Unspecified abdominal pain: Secondary | ICD-10-CM | POA: Insufficient documentation

## 2018-04-14 DIAGNOSIS — O219 Vomiting of pregnancy, unspecified: Secondary | ICD-10-CM | POA: Diagnosis not present

## 2018-04-14 DIAGNOSIS — Z3A22 22 weeks gestation of pregnancy: Secondary | ICD-10-CM

## 2018-04-14 DIAGNOSIS — O26899 Other specified pregnancy related conditions, unspecified trimester: Secondary | ICD-10-CM

## 2018-04-14 DIAGNOSIS — Z3480 Encounter for supervision of other normal pregnancy, unspecified trimester: Secondary | ICD-10-CM

## 2018-04-14 DIAGNOSIS — R102 Pelvic and perineal pain: Secondary | ICD-10-CM | POA: Diagnosis not present

## 2018-04-14 LAB — URINALYSIS, ROUTINE W REFLEX MICROSCOPIC
Bilirubin Urine: NEGATIVE
Glucose, UA: NEGATIVE mg/dL
Hgb urine dipstick: NEGATIVE
Ketones, ur: NEGATIVE mg/dL
Nitrite: NEGATIVE
Protein, ur: 30 mg/dL — AB
Renal Epithelial: <1
Specific Gravity, Urine: 1.015 (ref 1.005–1.030)
WBC, UA: >50 WBC/hpf — ABNORMAL HIGH (ref 0–5)
pH: 6 (ref 5.0–8.0)

## 2018-04-14 LAB — GC/CHLAMYDIA PROBE AMP (~~LOC~~) NOT AT ARMC
CHLAMYDIA, DNA PROBE: NEGATIVE
NEISSERIA GONORRHEA: NEGATIVE

## 2018-04-14 MED ORDER — ACETAMINOPHEN 500 MG PO TABS
1000.0000 mg | ORAL_TABLET | Freq: Once | ORAL | Status: AC
  Administered 2018-04-15: 1000 mg via ORAL
  Filled 2018-04-14: qty 2

## 2018-04-14 NOTE — MAU Note (Signed)
Pt reports lower abdominal pain that started last Monday and was seen here and was told it was round ligament pain. Pt states over the weekend the pain got worse and now she is having pelvic pain and pressure. Pt states she has not taken anything for pain. Pt denies vaginal bleeding or LOF. Pt reports that she has an increased frequency with urination.

## 2018-04-15 DIAGNOSIS — O26892 Other specified pregnancy related conditions, second trimester: Secondary | ICD-10-CM | POA: Diagnosis not present

## 2018-04-15 DIAGNOSIS — R102 Pelvic and perineal pain: Secondary | ICD-10-CM | POA: Diagnosis not present

## 2018-04-15 DIAGNOSIS — O219 Vomiting of pregnancy, unspecified: Secondary | ICD-10-CM | POA: Diagnosis not present

## 2018-04-15 DIAGNOSIS — Z3A22 22 weeks gestation of pregnancy: Secondary | ICD-10-CM | POA: Diagnosis not present

## 2018-04-15 MED ORDER — CYCLOBENZAPRINE HCL 10 MG PO TABS
10.0000 mg | ORAL_TABLET | Freq: Once | ORAL | Status: AC
  Administered 2018-04-15: 10 mg via ORAL
  Filled 2018-04-15: qty 1

## 2018-04-15 MED ORDER — ONDANSETRON 8 MG PO TBDP
8.0000 mg | ORAL_TABLET | Freq: Once | ORAL | Status: AC
  Administered 2018-04-15: 8 mg via ORAL
  Filled 2018-04-15: qty 1

## 2018-04-15 MED ORDER — CYCLOBENZAPRINE HCL 10 MG PO TABS: 10.0000 mg | ORAL_TABLET | Freq: Two times a day (BID) | ORAL | 0 refills | Status: DC | PRN

## 2018-04-15 NOTE — MAU Provider Note (Signed)
Chief Complaint: Abdominal Pain   First Provider Initiated Contact with Patient 04/15/18 0012      SUBJECTIVE HPI: Traci Castro is a 22 y.o. G3P1011 at [redacted]w[redacted]d by LMP who presents to maternity admissions reporting abdominal pain. Patient reports abdominal pain has been occurring for the past week- she was seen in MAU on 8/16 for same complaint of abdominal pain. She describes her pain as sharp and aching in her lower pelvis. She reports pain radiates up the sides of abdomen when she stands, moves or walks. She denies radiation to her back. Rates pain 5/10- has not taken any medication for abdominal pain. She reports increased frequency of urination, denies dysuria. She reports nausea that has been present since yesterday, has medication at home but has not taken any medication prior to coming to MAU. She denies vaginal bleeding, vaginal itching/burning,  h/a, dizziness, or fever/chills.     Past Medical History:  Diagnosis Date  . Anemia   . Anxiety    no meds  . Bipolar disorder (HCC)   . Depression    no meds  . Gastritis    2 bouts  . H/O seasonal allergies   . Migraine    migraines  . Psoriasis   . SVD (spontaneous vaginal delivery)    x 1  . Vision abnormalities    wears glasses   Past Surgical History:  Procedure Laterality Date  . DILATION AND CURETTAGE OF UTERUS    . DILATION AND EVACUATION N/A 09/20/2017   Procedure: SUCTION DILATATION AND EVACUATION;  Surgeon: Blenheim Bing, MD;  Location: [facility];  Service: Gynecology;  Laterality: N/A;  . ESOPHAGOGASTRODUODENOSCOPY N/A 10/17/2012   Procedure: ESOPHAGOGASTRODUODENOSCOPY (EGD);  Surgeon: Jon Gills, MD;  Location: [facility];  Service: Gastroenterology;  Laterality: N/A;  . THERAPEUTIC ABORTION  09/16/2017   Clinic  . WISDOM TOOTH EXTRACTION     Social History   Socioeconomic History  . Marital status: Single    Spouse name: Not on file  . Number of children: Not on file  . Years of education: Not on file  .  Highest education level: Not on file  Occupational History  . Not on file  Social Needs  . Financial resource strain: Not on file  . Food insecurity:    Worry: Not on file    Inability: Not on file  . Transportation needs:    Medical: Not on file    Non-medical: Not on file  Tobacco Use  . Smoking status: Never Smoker  . Smokeless tobacco: Never Used  Substance and Sexual Activity  . Alcohol use: No  . Drug use: No  . Sexual activity: Yes    Birth control/protection: None  Lifestyle  . Physical activity:    Days per week: Not on file    Minutes per session: Not on file  . Stress: Not on file  Relationships  . Social connections:    Talks on phone: Not on file    Gets together: Not on file    Attends religious service: Not on file    Active member of club or organization: Not on file    Attends meetings of clubs or organizations: Not on file    Relationship status: Not on file  . Intimate partner violence:    Fear of current or ex partner: Not on file    Emotionally abused: Not on file    Physically abused: Not on file    Forced sexual activity: Not on file  Other Topics Concern  . Not on file  Social History Narrative   10th grade   No current facility-administered medications on file prior to encounter.    Current Outpatient Medications on File Prior to Encounter  Medication Sig Dispense Refill  . acetaminophen (TYLENOL) 500 MG tablet Take 500 mg by mouth every 6 (six) hours as needed.    . Doxylamine-Pyridoxine (DICLEGIS) 10-10 MG TBEC Take 1 tablet with breakfast and lunch.  Take 2 tablets at bedtime. 100 tablet 4  . ondansetron (ZOFRAN ODT) 4 MG disintegrating tablet Take 1 tablet (4 mg total) by mouth every 8 (eight) hours as needed for nausea or vomiting. 20 tablet 0  . Prenatal Vit-Fe Phos-FA-Omega (VITAFOL GUMMIES) 3.33-0.333-34.8 MG CHEW Chew 3 tablets by mouth at bedtime. 90 tablet 12  . Elastic Bandages & Supports (COMFORT FIT MATERNITY SUPP LG) MISC 1  application by Does not apply route daily. 1 each 0  . ranitidine (ZANTAC) 150 MG tablet TAKE 1 TABLET BY MOUTH TWICE A DAY 180 tablet 2   Allergies  Allergen Reactions  . Chlorhexidine Itching and Rash    Severe itching, redness.   . Tramadol Nausea And Vomiting    ROS:  Review of Systems  Constitutional: Negative.   Respiratory: Negative.   Cardiovascular: Negative.   Gastrointestinal: Positive for abdominal pain and nausea. Negative for constipation, diarrhea and vomiting.  Genitourinary: Positive for frequency and pelvic pain. Negative for difficulty urinating, dysuria, urgency, vaginal bleeding, vaginal discharge and vaginal pain.  Musculoskeletal: Negative.   Neurological: Negative.    I have reviewed patient's Past Medical Hx, Surgical Hx, Family Hx, Social Hx, medications and allergies.   Physical Exam   Vitals:   04/14/18 2246 04/15/18 0134  BP: 112/65 134/84  Pulse: 92 88  Resp: 18 19  Temp: 98.8 F (37.1 C)   TempSrc: Oral   SpO2: 100%   Weight: 128.8 kg   Height: 5\' 3"  (1.6 m)    Constitutional: Well-developed, obese female in no acute distress.  Cardiovascular: normal rate Respiratory: normal effort GI: Abd soft, non-tender. Pos BS x 4 MS: Extremities nontender, no edema, normal ROM Neurologic: Alert and oriented x 4.  GU: Neg CVAT. PELVIC EXAM: deferred  FHT 135 by doppler  LAB RESULTS Results for orders placed or performed during the hospital encounter of 04/14/18 (from the past 24 hour(s))  Urinalysis, Routine w reflex microscopic     Status: Abnormal   Collection Time: 04/14/18 11:30 PM  Result Value Ref Range   Color, Urine YELLOW YELLOW   APPearance HAZY (A) CLEAR   Specific Gravity, Urine 1.015 1.005 - 1.030   pH 6.0 5.0 - 8.0   Glucose, UA NEGATIVE NEGATIVE mg/dL   Hgb urine dipstick NEGATIVE NEGATIVE   Bilirubin Urine NEGATIVE NEGATIVE   Ketones, ur NEGATIVE NEGATIVE mg/dL   Protein, ur 30 (A) NEGATIVE mg/dL   Nitrite NEGATIVE  NEGATIVE   Leukocytes, UA MODERATE (A) NEGATIVE   RBC / HPF 0-5 0 - 5 RBC/hpf   WBC, UA >50 (H) 0 - 5 WBC/hpf   Bacteria, UA FEW (A) NONE SEEN   Squamous Epithelial / LPF 0-5 0 - 5   Renal Epithelial <1    Mucus PRESENT     A/Positive/-- (06/04 1521)  MAU Management/MDM: Orders Placed This Encounter  Procedures  . Culture, OB Urine  . Urinalysis, Routine w reflex microscopic  . Discharge patient Discharge disposition: 01-Home or Self Care; Discharge patient date: 04/15/2018   Urine culture  pending  UA- notes moderate WBC and Leukocytes   Meds ordered this encounter  Medications  . acetaminophen (TYLENOL) tablet 1,000 mg  . cyclobenzaprine (FLEXERIL) tablet 10 mg  . ondansetron (ZOFRAN-ODT) disintegrating tablet 8 mg  . cyclobenzaprine (FLEXERIL) 10 MG tablet    Sig: Take 1 tablet (10 mg total) by mouth 2 (two) times daily as needed for muscle spasms.    Dispense:  15 tablet    Refill:  0    Order Specific Question:   Supervising Provider    Answer:   Adam PhenixARNOLD, JAMES G [[id number]]   Treatments in MAU included Tylenol 1000mg  for abdominal pain, patient reports no decrease in pain after treatment. Flexeril 10mg  for abdominal pain, patient reports decrease of pain to 2/10. Pt discharged. Pt stable at time of discharge. Rx for Flexeril sent to pharmacy of choice. Will call patient with results of urine culture and send treatment if positive. Patient verbalizes understanding  ASSESSMENT 1. Pain of round ligament affecting pregnancy, antepartum   2. [name], antepartum   3. [redacted] weeks gestation of pregnancy   4. Nausea and vomiting during pregnancy     PLAN Discharge home Follow up as scheduled for prenatal appointments  Return to MAU as needed  Urine culture pending  Rx for flexeril sent to pharmacy  Discussed safe medications to take in pregnancy  Hydration   Follow-up Information    [facility]   Specialty:   Obstetrics and Gynecology Why:  Follow up as scheduled for prenatal appointments  Contact information: [address], [address] [place] [id number] [phone]          Allergies as of 04/15/2018      Reactions   Chlorhexidine Itching, Rash   Severe itching, redness.    Tramadol Nausea And Vomiting      Medication List    TAKE these medications   acetaminophen 500 MG tablet Commonly known as:  TYLENOL Take 500 mg by mouth every 6 (six) hours as needed.   COMFORT FIT MATERNITY SUPP LG Misc 1 application by Does not apply route daily.   cyclobenzaprine 10 MG tablet Commonly known as:  FLEXERIL Take 1 tablet (10 mg total) by mouth 2 (two) times daily as needed for muscle spasms.   Doxylamine-Pyridoxine 10-10 MG Tbec Take 1 tablet with breakfast and lunch.  Take 2 tablets at bedtime.   ondansetron 4 MG disintegrating tablet Commonly known as:  ZOFRAN-ODT Take 1 tablet (4 mg total) by mouth every 8 (eight) hours as needed for nausea or vomiting.   ranitidine 150 MG tablet Commonly known as:  ZANTAC TAKE 1 TABLET BY MOUTH TWICE A DAY   VITAFOL GUMMIES 3.33-0.333-34.8 MG Chew Chew 3 tablets by mouth at bedtime.        Steward DroneVeronica Maxum Cassarino  Certified Nurse-Midwife 04/15/2018  1:23 AM

## 2018-04-17 ENCOUNTER — Other Ambulatory Visit: Payer: Self-pay | Admitting: Certified Nurse Midwife

## 2018-04-17 ENCOUNTER — Encounter: Payer: Self-pay | Admitting: Certified Nurse Midwife

## 2018-04-17 DIAGNOSIS — N3000 Acute cystitis without hematuria: Secondary | ICD-10-CM

## 2018-04-17 DIAGNOSIS — O2342 Unspecified infection of urinary tract in pregnancy, second trimester: Secondary | ICD-10-CM

## 2018-04-17 LAB — CULTURE, OB URINE: Culture: >=100000 — AB

## 2018-04-17 MED ORDER — NITROFURANTOIN MONOHYD MACRO 100 MG PO CAPS: 100.0000 mg | ORAL_CAPSULE | Freq: Two times a day (BID) | ORAL | 0 refills | Status: DC

## 2018-04-17 NOTE — Progress Notes (Signed)
Patient urine culture came back positive for UTI. Medication sent to pharmacy of choice and patient notified.   Sharyon CableVeronica C Mahmud Keithly, CNM 04/17/18, 8:50 AM

## 2018-04-23 ENCOUNTER — Ambulatory Visit (INDEPENDENT_AMBULATORY_CARE_PROVIDER_SITE_OTHER): Payer: 59 | Admitting: Obstetrics and Gynecology

## 2018-04-23 ENCOUNTER — Encounter: Payer: Self-pay | Admitting: Obstetrics and Gynecology

## 2018-04-23 VITALS — BP 113/73 | HR 103 | Wt 287.0 lb

## 2018-04-23 DIAGNOSIS — O2342 Unspecified infection of urinary tract in pregnancy, second trimester: Secondary | ICD-10-CM

## 2018-04-23 DIAGNOSIS — Z3480 Encounter for supervision of other normal pregnancy, unspecified trimester: Secondary | ICD-10-CM

## 2018-04-23 NOTE — Patient Instructions (Signed)

## 2018-04-23 NOTE — Progress Notes (Signed)
Subjective:  Traci Castro is a 22 y.o. G3P1011 at 5451w3d being seen today for ongoing prenatal care.  She is currently monitored for the following issues for this low-risk pregnancy and has BMI 45.0-49.9, adult (HCC); [name], antepartum; [name], antepartum; [name]; and UTI (urinary tract infection) during pregnancy, second trimester on their problem list.  Patient reports no complaints.  Contractions: Irritability. Vag. Bleeding: None.  Movement: Present. Denies leaking of fluid.   The following portions of the patient's history were reviewed and updated as appropriate: allergies, current medications, past family history, past medical history, past social history, past surgical history and problem list. Problem list updated.  Objective:   Vitals:   04/23/18 1358  BP: 113/73  Pulse: (!) 103  Weight: 287 lb (130.2 kg)    Fetal Status: Fetal Heart Rate (bpm): 136   Movement: Present     General:  Alert, oriented and cooperative. Patient is in no acute distress.  Skin: Skin is warm and dry. No rash noted.   Cardiovascular: Normal heart rate noted  Respiratory: Normal respiratory effort, no problems with respiration noted  Abdomen: Soft, gravid, appropriate for gestational age. Pain/Pressure: Present     Pelvic:  Cervical exam deferred        Extremities: Normal range of motion.  Edema: None  Mental Status: Normal mood and affect. Normal behavior. Normal judgment and thought content.   Urinalysis:      Assessment and Plan:  Pregnancy: G3P1011 at 6151w3d  1. [name], antepartum Stable Glucola next visit  2. UTI (urinary tract infection) during pregnancy, second trimester Pt was unaware of Rx at pharmacy Pt made aware and reports will pick up  Preterm labor symptoms and general obstetric precautions including but not limited to vaginal bleeding, contractions, leaking of fluid and fetal  movement were reviewed in detail with the patient. Please refer to After Visit Summary for other counseling recommendations.  Return in about 4 weeks (around 05/21/2018) for OB visit.   Hermina StaggersErvin, Lavern Maslow L, MD

## 2018-04-23 NOTE — Progress Notes (Signed)
Pt is G3P1 102w3d here for ROB. Pt states she is aware of UTI and medication at pharmacy but has not had a chance to go pick it up yet.

## 2018-04-29 ENCOUNTER — Ambulatory Visit (HOSPITAL_COMMUNITY)
Admission: RE | Admit: 2018-04-29 | Discharge: 2018-04-29 | Disposition: A | Discharge status: Home / Self Care | Payer: 59 | Source: Ambulatory Visit | Attending: Obstetrics | Admitting: Obstetrics

## 2018-04-29 DIAGNOSIS — O99012 Anemia complicating pregnancy, second trimester: Secondary | ICD-10-CM | POA: Diagnosis not present

## 2018-04-29 DIAGNOSIS — Z3A24 24 weeks gestation of pregnancy: Secondary | ICD-10-CM

## 2018-04-29 DIAGNOSIS — O99212 Obesity complicating pregnancy, second trimester: Secondary | ICD-10-CM | POA: Insufficient documentation

## 2018-04-29 DIAGNOSIS — O99210 Obesity complicating pregnancy, unspecified trimester: Secondary | ICD-10-CM

## 2018-05-21 ENCOUNTER — Ambulatory Visit (INDEPENDENT_AMBULATORY_CARE_PROVIDER_SITE_OTHER): Payer: 59 | Admitting: Obstetrics & Gynecology

## 2018-05-21 ENCOUNTER — Other Ambulatory Visit: Payer: 59

## 2018-05-21 VITALS — BP 127/78 | HR 102 | Wt 286.2 lb

## 2018-05-21 DIAGNOSIS — Z23 Encounter for immunization: Secondary | ICD-10-CM

## 2018-05-21 DIAGNOSIS — Z3480 Encounter for supervision of other normal pregnancy, unspecified trimester: Secondary | ICD-10-CM

## 2018-05-21 DIAGNOSIS — Z3482 Encounter for supervision of other normal pregnancy, second trimester: Secondary | ICD-10-CM

## 2018-05-21 NOTE — Progress Notes (Signed)
   PRENATAL VISIT NOTE  Subjective:  Traci Castro is a 22 y.o. G3P1011 at 2968w3d being seen today for ongoing prenatal care.  She is currently monitored for the following issues for this low-risk pregnancy and has BMI 45.0-49.9, adult (HCC); [name], antepartum; [name], antepartum; [name]; and UTI (urinary tract infection) during pregnancy, second trimester on their problem list.  Patient reports nausea and this followed her glucose load today, O/W no nausea or reflux noted.  Contractions: Irritability. Vag. Bleeding: None.  Movement: Present. Denies leaking of fluid.   The following portions of the patient's history were reviewed and updated as appropriate: allergies, current medications, past family history, past medical history, past social history, past surgical history and problem list. Problem list updated.  Objective:   Vitals:   05/21/18 0947  BP: 127/78  Pulse: (!) 102  Weight: 286 lb 3.2 oz (129.8 kg)    Fetal Status:     Movement: Present     General:  Alert, oriented and cooperative. Patient is in no acute distress.  Skin: Skin is warm and dry. No rash noted.   Cardiovascular: Normal heart rate noted  Respiratory: Normal respiratory effort, no problems with respiration noted  Abdomen: Soft, gravid, appropriate for gestational age.  Pain/Pressure: Absent     Pelvic: Cervical exam deferred        Extremities: Normal range of motion.  Edema: Trace  Mental Status: Normal mood and affect. Normal behavior. Normal judgment and thought content.   Assessment and Plan:  Pregnancy: G3P1011 at 7668w3d  1. [name], antepartum routine testing - Glucose Tolerance, 2 Hours w/1 Hour - CBC - HIV Antibody (routine testing w rflx) - RPR - Tdap vaccine greater than or equal to 7yo IM  2. Encounter for immunization given - Flu Vaccine QUAD 36+ mos IM (Fluarix, Quad PF)  Preterm labor symptoms  and general obstetric precautions including but not limited to vaginal bleeding, contractions, leaking of fluid and fetal movement were reviewed in detail with the patient. Please refer to After Visit Summary for other counseling recommendations.  Return in about 2 weeks (around 06/04/2018).  No future appointments.  Scheryl DarterJames Derwin Reddy, MD

## 2018-05-21 NOTE — Patient Instructions (Signed)

## 2018-05-21 NOTE — Progress Notes (Signed)
Pt presents for ROB.  No concerns! 

## 2018-05-22 LAB — HIV ANTIBODY (ROUTINE TESTING W REFLEX): HIV Screen 4th Generation wRfx: NONREACTIVE

## 2018-05-22 LAB — GLUCOSE TOLERANCE, 2 HOURS W/ 1HR
GLUCOSE, 2 HOUR: 91 mg/dL (ref 65–152)
GLUCOSE, FASTING: 80 mg/dL (ref 65–91)
Glucose, 1 hour: 93 mg/dL (ref 65–179)

## 2018-05-22 LAB — CBC
Hematocrit: 28.1 % — ABNORMAL LOW (ref 34.0–46.6)
Hemoglobin: 8.8 g/dL — ABNORMAL LOW (ref 11.1–15.9)
MCH: 24.4 pg — AB (ref 26.6–33.0)
MCHC: 31.3 g/dL — AB (ref 31.5–35.7)
MCV: 78 fL — ABNORMAL LOW (ref 79–97)
PLATELETS: 348 10*3/uL (ref 150–450)
RBC: 3.61 x10E6/uL — AB (ref 3.77–5.28)
RDW: 14.3 % (ref 12.3–15.4)
WBC: 9.8 10*3/uL (ref 3.4–10.8)

## 2018-05-22 LAB — RPR: RPR: NONREACTIVE

## 2018-05-31 ENCOUNTER — Inpatient Hospital Stay (HOSPITAL_COMMUNITY): Payer: 59

## 2018-05-31 ENCOUNTER — Encounter (HOSPITAL_COMMUNITY): Payer: Self-pay | Admitting: *Deleted

## 2018-05-31 ENCOUNTER — Inpatient Hospital Stay (HOSPITAL_COMMUNITY)
Admission: AD | Admit: 2018-05-31 | Discharge: 2018-05-31 | Disposition: A | Discharge status: Home / Self Care | Payer: 59 | Source: Ambulatory Visit | Attending: Obstetrics & Gynecology | Admitting: Obstetrics & Gynecology

## 2018-05-31 DIAGNOSIS — Z3689 Encounter for other specified antenatal screening: Secondary | ICD-10-CM

## 2018-05-31 DIAGNOSIS — Z79899 Other long term (current) drug therapy: Secondary | ICD-10-CM | POA: Insufficient documentation

## 2018-05-31 DIAGNOSIS — O26893 Other specified pregnancy related conditions, third trimester: Secondary | ICD-10-CM | POA: Diagnosis not present

## 2018-05-31 DIAGNOSIS — Z888 Allergy status to other drugs, medicaments and biological substances status: Secondary | ICD-10-CM | POA: Diagnosis not present

## 2018-05-31 DIAGNOSIS — N390 Urinary tract infection, site not specified: Secondary | ICD-10-CM | POA: Diagnosis not present

## 2018-05-31 DIAGNOSIS — Z3A28 28 weeks gestation of pregnancy: Secondary | ICD-10-CM | POA: Diagnosis not present

## 2018-05-31 DIAGNOSIS — N3289 Other specified disorders of bladder: Secondary | ICD-10-CM | POA: Insufficient documentation

## 2018-05-31 DIAGNOSIS — M549 Dorsalgia, unspecified: Secondary | ICD-10-CM | POA: Diagnosis not present

## 2018-05-31 LAB — WET PREP, GENITAL
Sperm: NONE SEEN
Trich, Wet Prep: NONE SEEN
Yeast Wet Prep HPF POC: NONE SEEN

## 2018-05-31 LAB — URINALYSIS, ROUTINE W REFLEX MICROSCOPIC
Bilirubin Urine: NEGATIVE
Glucose, UA: NEGATIVE mg/dL
Hgb urine dipstick: NEGATIVE
Ketones, ur: 5 mg/dL — AB
Nitrite: NEGATIVE
Protein, ur: 100 mg/dL — AB
Specific Gravity, Urine: 1.024 (ref 1.005–1.030)
WBC, UA: >50 WBC/hpf — ABNORMAL HIGH (ref 0–5)
pH: 6 (ref 5.0–8.0)

## 2018-05-31 MED ORDER — CYCLOBENZAPRINE HCL 10 MG PO TABS
10.0000 mg | ORAL_TABLET | Freq: Once | ORAL | Status: AC
  Administered 2018-05-31: 10 mg via ORAL
  Filled 2018-05-31: qty 1

## 2018-05-31 MED ORDER — CYCLOBENZAPRINE HCL 10 MG PO TABS: 10.0000 mg | ORAL_TABLET | Freq: Two times a day (BID) | ORAL | 1 refills | Status: DC | PRN

## 2018-05-31 NOTE — MAU Note (Signed)
Pt reports back pain since 1400, it is constant, reports it is aching  No bleeding, no LOF, feeling FM  Reports some vaginal discharge, mild odor

## 2018-05-31 NOTE — MAU Note (Signed)
Pt repots pain in her back only on her L side. Pain started at 2:30 pm. She compares the pain to what it felt like when she was in back labor with her first delivery. Denies bleeding, lof or intercourse. Pt 9/10

## 2018-05-31 NOTE — MAU Provider Note (Signed)
History     CSN: [id number]  Arrival date and time: 05/31/18 4098   First Provider Initiated Contact with Patient 05/31/18 1927      Chief Complaint  Patient presents with  . Back Pain   Traci Castro is a 22 y.o. G3P1 at [redacted]w[redacted]d who presents to MAU with complaints of back pain. She reports back pain started occurring this afternoon around 1400 while she was at work, describes back pain as sharp and aching that is specific to her left side. She rates pain 9/10- has not taken any medication for back pain. Hx of UTI last month that she was treated for. She denies lower abdominal cramping, vaginal bleeding, vaginal discharge, LOF. Reports +FM. Denies complications during this pregnancy.    OB History    Gravida  3   Para  1   Term  1   Preterm      AB  1   Living  1     SAB      TAB  1   Ectopic      Multiple  0   Live Births  1           Past Medical History:  Diagnosis Date  . Anemia   . Anxiety    no meds  . Bipolar disorder (HCC)   . Depression    no meds  . Gastritis    2 bouts  . H/O seasonal allergies   . Migraine    migraines  . Psoriasis   . SVD (spontaneous vaginal delivery)    x 1  . Vision abnormalities    wears glasses    Past Surgical History:  Procedure Laterality Date  . DILATION AND CURETTAGE OF UTERUS    . DILATION AND EVACUATION N/A 09/20/2017   Procedure: SUCTION DILATATION AND EVACUATION;  Surgeon: [place] Bing, MD;  Location: [facility];  Service: Gynecology;  Laterality: N/A;  . ESOPHAGOGASTRODUODENOSCOPY N/A 10/17/2012   Procedure: ESOPHAGOGASTRODUODENOSCOPY (EGD);  Surgeon: Jon Gills, MD;  Location: [facility] OR;  Service: Gastroenterology;  Laterality: N/A;  . THERAPEUTIC ABORTION  09/16/2017   Clinic  . WISDOM TOOTH EXTRACTION      Family History  Problem Relation Age of Onset  . Cholelithiasis Mother   . Hyperlipidemia Mother   . Hypertension Mother   . Mental illness Mother   . Cholelithiasis Maternal Uncle   .  Hyperlipidemia Maternal Uncle   . Hypertension Maternal Uncle   . Cholelithiasis Maternal Grandmother   . Arthritis Maternal Grandmother   . COPD Maternal Grandmother   . Depression Maternal Grandmother   . Diabetes Maternal Grandmother   . Heart disease Maternal Grandmother   . Hyperlipidemia Maternal Grandmother   . Hypertension Maternal Grandmother   . Stroke Maternal Grandmother   . Learning disabilities Paternal Grandmother   . Mental illness Paternal Grandmother   . Cancer Paternal Grandmother   . Hyperlipidemia Paternal Grandfather   . Diabetes Paternal Grandfather   . Depression Father   . Hypertension Father   . Ulcers Neg Hx   . Alcohol abuse Neg Hx   . Asthma Neg Hx   . Birth defects Neg Hx   . Drug abuse Neg Hx   . Early death Neg Hx   . Hearing loss Neg Hx   . Kidney disease Neg Hx   . Mental retardation Neg Hx   . Miscarriages / Stillbirths Neg Hx   . Vision loss Neg Hx     Social History  Tobacco Use  . Smoking status: Never Smoker  . Smokeless tobacco: Never Used  Substance Use Topics  . Alcohol use: No  . Drug use: No    Allergies:  Allergies  Allergen Reactions  . Chlorhexidine Itching and Rash    Severe itching, redness.   . Tramadol Nausea And Vomiting    Medications Prior to Admission  Medication Sig Dispense Refill Last Dose  . acetaminophen (TYLENOL) 500 MG tablet Take 500 mg by mouth every 6 (six) hours as needed.   Past Month at Unknown time  . cyclobenzaprine (FLEXERIL) 10 MG tablet Take 1 tablet (10 mg total) by mouth 2 (two) times daily as needed for muscle spasms. 15 tablet 0 Past Week at Unknown time  . Doxylamine-Pyridoxine (DICLEGIS) 10-10 MG TBEC Take 1 tablet with breakfast and lunch.  Take 2 tablets at bedtime. 100 tablet 4 05/31/2018 at Unknown time  . Elastic Bandages & Supports (COMFORT FIT MATERNITY SUPP LG) MISC 1 application by Does not apply route daily. 1 each 0 05/31/2018 at Unknown time  . ondansetron (ZOFRAN ODT) 4 MG  disintegrating tablet Take 1 tablet (4 mg total) by mouth every 8 (eight) hours as needed for nausea or vomiting. 20 tablet 0 Past Week at Unknown time  . Prenatal Vit-Fe Phos-FA-Omega (VITAFOL GUMMIES) 3.33-0.333-34.8 MG CHEW Chew 3 tablets by mouth at bedtime. 90 tablet 12 05/31/2018 at Unknown time  . ranitidine (ZANTAC) 150 MG tablet TAKE 1 TABLET BY MOUTH TWICE A DAY 180 tablet 2 Past Month at Unknown time    Review of Systems  Constitutional: Negative.   Respiratory: Negative.   Cardiovascular: Negative.   Gastrointestinal: Negative.   Genitourinary: Negative.   Musculoskeletal: Positive for back pain.  Neurological: Negative.    Physical Exam   Blood pressure 128/74, pulse 97, temperature 98.4 F (36.9 C), resp. rate 20, weight 129.2 kg, last menstrual period 10/19/2017.  Physical Exam  Nursing note and vitals reviewed. Constitutional: She is oriented to person, place, and time. She appears well-developed and well-nourished. She appears distressed.  Cardiovascular: Normal rate, regular rhythm and normal heart sounds.  Respiratory: Breath sounds normal. No respiratory distress. She has no wheezes.  GI: Soft. There is no tenderness. There is CVA tenderness. There is no rebound.  Gravid appropriate for gestational age, CVA tenderness on the left side, no tenderness on right.  Musculoskeletal: Normal range of motion. She exhibits no edema.  Neurological: She is alert and oriented to person, place, and time.   Dilation: Closed Effacement (%): Thick Cervical Position: Posterior Exam by:: V Wandalee Klang CNM   FHR: 120/ moderate/ +accels/ no decelerations  Toco: 1 uterine contractions/ mild by palpation   MAU Course  Procedures  MDM Orders Placed This Encounter  Procedures  . Wet prep, genital  . Culture, OB Urine  . US RENAL  . Urinalysis, Routine w reflex microscopic   Results for orders placed or performed during the hospital encounter of 05/31/18 (from the past 24 hour(s))   Urinalysis, Routine w reflex microscopic     Status: Abnormal   Collection Time: 05/31/18  6:59 PM  Result Value Ref Range   Color, Urine YELLOW YELLOW   APPearance CLEAR CLEAR   Specific Gravity, Urine 1.024 1.005 - 1.030   pH 6.0 5.0 - 8.0   Glucose, UA NEGATIVE NEGATIVE mg/dL   Hgb urine dipstick NEGATIVE NEGATIVE   Bilirubin Urine NEGATIVE NEGATIVE   Ketones, ur 5 (A) NEGATIVE mg/dL   Protein, ur 161 (A) NEGATIVE  mg/dL   Nitrite NEGATIVE NEGATIVE   Leukocytes, UA TRACE (A) NEGATIVE   RBC / HPF 0-5 0 - 5 RBC/hpf   WBC, UA >50 (H) 0 - 5 WBC/hpf   Bacteria, UA FEW (A) NONE SEEN   Squamous Epithelial / LPF 0-5 0 - 5   Mucus PRESENT   Wet prep, genital     Status: Abnormal   Collection Time: 05/31/18  7:34 PM  Result Value Ref Range   Yeast Wet Prep HPF POC NONE SEEN NONE SEEN   Trich, Wet Prep NONE SEEN NONE SEEN   Clue Cells Wet Prep HPF POC PRESENT (A) NONE SEEN   WBC, Wet Prep HPF POC FEW (A) NONE SEEN   Sperm NONE SEEN    US Renal  Result Date: 05/31/2018 CLINICAL DATA:  Back pain during third trimester of pregnancy, [redacted] weeks pregnant, recent UTI EXAM: RENAL / URINARY TRACT ULTRASOUND COMPLETE COMPARISON:  CT abdomen and pelvis 01/28/2015 FINDINGS: Right Kidney: Length: 12.0 cm. Normal cortical thickness and echogenicity. No mass, hydronephrosis or shadowing calcification. Left Kidney: Length: 12.4 cm. Normal cortical thickness and echogenicity. No mass, hydronephrosis or shadowing calcification. Bladder: Appears normal for degree of bladder distention. BILATERAL ureteral jets visualized. IMPRESSION: Normal exam. Electronically Signed   By: Ulyses Southward M.D.   On: 05/31/2018 20:12   NST reactive  Wet prep- positive for clue cells, otherwise negative  Urine culture pending  GC/C pending  [place] negative for kidney stones   Discussed results of labs and [place] with patient. Educated on use of maternity support belt, lumbar support pillow while at work, ice/heat on back  intermittently, positioning, body mechanics and use of Flexeril as needed. Rx for Flexeril sent to pharmacy. Pt stable at time of discharge.   Assessment and Plan   1. Back pain during pregnancy in third trimester   2. [redacted] weeks gestation of pregnancy   3. NST (non-stress test) reactive    Discharge home  Return to MAU as needed for emergencies  Follow up as scheduled for prenatal appointments  Rx for Flexeril  Hydration, body mechanics, position changes, and lumbar support pillow   Follow-up Information    [facility] Follow up.   Specialty:  Obstetrics and Gynecology Why:  follow up as scheduled for prenatal appointments and return to MAU as needed  Contact information: [address], [address] [place] [address] [phone]          Allergies as of 05/31/2018      Reactions   Chlorhexidine Itching, Rash   Severe itching, redness.    Tramadol Nausea And Vomiting      Medication List    TAKE these medications   acetaminophen 500 MG tablet Commonly known as:  TYLENOL Take 500 mg by mouth every 6 (six) hours as needed.   COMFORT FIT MATERNITY SUPP LG Misc 1 application by Does not apply route daily.   cyclobenzaprine 10 MG tablet Commonly known as:  FLEXERIL Take 1 tablet (10 mg total) by mouth 2 (two) times daily as needed for muscle spasms.   Doxylamine-Pyridoxine 10-10 MG Tbec Take 1 tablet with breakfast and lunch.  Take 2 tablets at bedtime.   ondansetron 4 MG disintegrating tablet Commonly known as:  ZOFRAN-ODT Take 1 tablet (4 mg total) by mouth every 8 (eight) hours as needed for nausea or vomiting.   ranitidine 150 MG tablet Commonly known as:  ZANTAC TAKE 1 TABLET BY MOUTH TWICE A  DAY   VITAFOL GUMMIES 3.33-0.333-34.8 MG Chew Chew 3 tablets by mouth at bedtime.      Sharyon Cable CNM 05/31/2018, 8:40 PM

## 2018-06-01 ENCOUNTER — Inpatient Hospital Stay (HOSPITAL_COMMUNITY)
Admission: AD | Admit: 2018-06-01 | Discharge: 2018-06-02 | Disposition: A | Discharge status: Home / Self Care | Payer: 59 | Attending: Obstetrics & Gynecology | Admitting: Obstetrics & Gynecology

## 2018-06-01 ENCOUNTER — Encounter (HOSPITAL_COMMUNITY): Payer: Self-pay

## 2018-06-01 ENCOUNTER — Other Ambulatory Visit: Payer: Self-pay

## 2018-06-01 DIAGNOSIS — N949 Unspecified condition associated with female genital organs and menstrual cycle: Secondary | ICD-10-CM | POA: Diagnosis not present

## 2018-06-01 DIAGNOSIS — Z3A29 29 weeks gestation of pregnancy: Secondary | ICD-10-CM | POA: Diagnosis not present

## 2018-06-01 DIAGNOSIS — O26893 Other specified pregnancy related conditions, third trimester: Secondary | ICD-10-CM | POA: Diagnosis not present

## 2018-06-01 DIAGNOSIS — R102 Pelvic and perineal pain: Secondary | ICD-10-CM | POA: Diagnosis not present

## 2018-06-01 DIAGNOSIS — O2343 Unspecified infection of urinary tract in pregnancy, third trimester: Secondary | ICD-10-CM

## 2018-06-01 DIAGNOSIS — O2342 Unspecified infection of urinary tract in pregnancy, second trimester: Secondary | ICD-10-CM

## 2018-06-01 DIAGNOSIS — Z3480 Encounter for supervision of other normal pregnancy, unspecified trimester: Secondary | ICD-10-CM

## 2018-06-01 LAB — URINALYSIS, ROUTINE W REFLEX MICROSCOPIC
Bilirubin Urine: NEGATIVE
GLUCOSE, UA: NEGATIVE mg/dL
Hgb urine dipstick: NEGATIVE
Ketones, ur: NEGATIVE mg/dL
Nitrite: NEGATIVE
PH: 6 (ref 5.0–8.0)
Protein, ur: >=300 mg/dL — AB
Specific Gravity, Urine: 1.024 (ref 1.005–1.030)

## 2018-06-01 MED ORDER — ONDANSETRON 8 MG PO TBDP
8.0000 mg | ORAL_TABLET | Freq: Once | ORAL | Status: AC
  Administered 2018-06-01: 8 mg via ORAL
  Filled 2018-06-01: qty 1

## 2018-06-01 MED ORDER — OXYCODONE-ACETAMINOPHEN 5-325 MG PO TABS
2.0000 | ORAL_TABLET | Freq: Once | ORAL | Status: AC
  Administered 2018-06-01: 2 via ORAL
  Filled 2018-06-01: qty 2

## 2018-06-01 NOTE — MAU Note (Signed)
Pt. Reports she was here yesterday. Reports the pain is still a 9, but more pelvic pressure than yesterday. States she tool Flexeril at 1200, and has not taken it since. Denies bleeding.

## 2018-06-02 DIAGNOSIS — O26893 Other specified pregnancy related conditions, third trimester: Secondary | ICD-10-CM | POA: Diagnosis not present

## 2018-06-02 DIAGNOSIS — N949 Unspecified condition associated with female genital organs and menstrual cycle: Secondary | ICD-10-CM | POA: Diagnosis not present

## 2018-06-02 DIAGNOSIS — O2343 Unspecified infection of urinary tract in pregnancy, third trimester: Secondary | ICD-10-CM | POA: Diagnosis not present

## 2018-06-02 LAB — GC/CHLAMYDIA PROBE AMP (~~LOC~~) NOT AT ARMC
Chlamydia: NEGATIVE
Neisseria Gonorrhea: NEGATIVE

## 2018-06-02 MED ORDER — CEPHALEXIN 500 MG PO CAPS: 500.0000 mg | ORAL_CAPSULE | Freq: Four times a day (QID) | ORAL | 0 refills | Status: DC

## 2018-06-02 NOTE — MAU Provider Note (Signed)
History     CSN: [id number]  Arrival date and time: 06/01/18 2129   First Provider Initiated Contact with Patient 06/01/18 2255      Chief Complaint  Patient presents with  . Pelvic Pain   HPI  Ms.  Traci Castro is a 22 y.o. year old G69P1011 female at [redacted]w[redacted]d weeks gestation who presents to MAU reporting pelvic pressure that is worse than yesterday 05/31/18. She reports she took a Flexeril at 1200, but nothing else since then. She rates her pain 9/10.  She denies VB or LOF. She reports good (+) FM today.  Past Medical History:  Diagnosis Date  . Anemia   . Anxiety    no meds  . Bipolar disorder (HCC)   . Depression    no meds  . Gastritis    2 bouts  . H/O seasonal allergies   . Migraine    migraines  . Psoriasis   . SVD (spontaneous vaginal delivery)    x 1  . Vision abnormalities    wears glasses    Past Surgical History:  Procedure Laterality Date  . DILATION AND CURETTAGE OF UTERUS    . DILATION AND EVACUATION N/A 09/20/2017   Procedure: SUCTION DILATATION AND EVACUATION;  Surgeon:  Bing, MD;  Location: [facility];  Service: Gynecology;  Laterality: N/A;  . ESOPHAGOGASTRODUODENOSCOPY N/A 10/17/2012   Procedure: ESOPHAGOGASTRODUODENOSCOPY (EGD);  Surgeon: Jon Gills, MD;  Location: [facility] OR;  Service: Gastroenterology;  Laterality: N/A;  . THERAPEUTIC ABORTION  09/16/2017   Clinic  . WISDOM TOOTH EXTRACTION      Family History  Problem Relation Age of Onset  . Cholelithiasis Mother   . Hyperlipidemia Mother   . Hypertension Mother   . Mental illness Mother   . Cholelithiasis Maternal Uncle   . Hyperlipidemia Maternal Uncle   . Hypertension Maternal Uncle   . Cholelithiasis Maternal Grandmother   . Arthritis Maternal Grandmother   . COPD Maternal Grandmother   . Depression Maternal Grandmother   . Diabetes Maternal Grandmother   . Heart disease Maternal Grandmother   . Hyperlipidemia Maternal Grandmother   . Hypertension Maternal Grandmother    . Stroke Maternal Grandmother   . Learning disabilities Paternal Grandmother   . Mental illness Paternal Grandmother   . Cancer Paternal Grandmother   . Hyperlipidemia Paternal Grandfather   . Diabetes Paternal Grandfather   . Depression Father   . Hypertension Father   . Ulcers Neg Hx   . Alcohol abuse Neg Hx   . Asthma Neg Hx   . Birth defects Neg Hx   . Drug abuse Neg Hx   . Early death Neg Hx   . Hearing loss Neg Hx   . Kidney disease Neg Hx   . Mental retardation Neg Hx   . Miscarriages / Stillbirths Neg Hx   . Vision loss Neg Hx     Social History   Tobacco Use  . Smoking status: Never Smoker  . Smokeless tobacco: Never Used  Substance Use Topics  . Alcohol use: No  . Drug use: No    Allergies:  Allergies  Allergen Reactions  . Chlorhexidine Itching and Rash    Severe itching, redness.   . Tramadol Nausea And Vomiting    Medications Prior to Admission  Medication Sig Dispense Refill Last Dose  . cyclobenzaprine (FLEXERIL) 10 MG tablet Take 1 tablet (10 mg total) by mouth 2 (two) times daily as needed for muscle spasms. 20 tablet 1 06/01/2018 at  Unknown time  . Prenatal Vit-Fe Phos-FA-Omega (VITAFOL GUMMIES) 3.33-0.333-34.8 MG CHEW Chew 3 tablets by mouth at bedtime. 90 tablet 12 06/01/2018 at Unknown time  . acetaminophen (TYLENOL) 500 MG tablet Take 500 mg by mouth every 6 (six) hours as needed.   Past Month at Unknown time  . Doxylamine-Pyridoxine (DICLEGIS) 10-10 MG TBEC Take 1 tablet with breakfast and lunch.  Take 2 tablets at bedtime. 100 tablet 4 05/31/2018 at Unknown time  . Elastic Bandages & Supports (COMFORT FIT MATERNITY SUPP LG) MISC 1 application by Does not apply route daily. 1 each 0 05/31/2018 at Unknown time  . ondansetron (ZOFRAN ODT) 4 MG disintegrating tablet Take 1 tablet (4 mg total) by mouth every 8 (eight) hours as needed for nausea or vomiting. 20 tablet 0 Past Week at Unknown time  . ranitidine (ZANTAC) 150 MG tablet TAKE 1 TABLET BY MOUTH  TWICE A DAY 180 tablet 2 Past Month at Unknown time    Review of Systems  Constitutional: Negative.   HENT: Negative.   Eyes: Negative.   Respiratory: Negative.   Cardiovascular: Negative.   Gastrointestinal: Negative.   Endocrine: Negative.   Genitourinary: Positive for pelvic pain (pressure).  Musculoskeletal: Negative.   Skin: Negative.   Allergic/Immunologic: Negative.   Neurological: Negative.   Hematological: Negative.   Psychiatric/Behavioral: Negative.    Physical Exam   Blood pressure 123/72, pulse 99, temperature 98 F (36.7 C), temperature source Oral, resp. rate 18, height 5\' 3"  (1.6 m), weight 128 kg, last menstrual period 10/19/2017.  Physical Exam  Nursing note and vitals reviewed. Constitutional: She is oriented to person, place, and time. She appears well-developed and well-nourished.  HENT:  Head: Normocephalic and atraumatic.  Eyes: Pupils are equal, round, and reactive to light.  Neck: Normal range of motion.  Cardiovascular: Normal rate and regular rhythm.  Respiratory: Effort normal.  GI: Soft.  Genitourinary:  Genitourinary Comments: Dilation: Closed Effacement (%): Thick Cervical Position: Posterior Exam by: Arita Miss, RN   Musculoskeletal: Normal range of motion.  Neurological: She is alert and oriented to person, place, and time.  Skin: Skin is warm and dry.  Psychiatric: She has a normal mood and affect. Her behavior is normal. Judgment and thought content normal.    MAU Course  Procedures  MDM NST - FHR: 125 bpm / moderate variability / accels present / decels absent / TOCO: UI noted  *Consult with Dr. Erin Fulling @ 2306 - notified of patient's complaints, assessments, lab & NST results, tx plan d/c home, F/U at [facility] - ok to d/c home, tx for UTI before UCx resulted    Results for orders placed or performed during the hospital encounter of 06/01/18 (from the past 24 hour(s))  Urinalysis, Routine w reflex microscopic     Status:  Abnormal   Collection Time: 06/01/18 10:05 PM  Result Value Ref Range   Color, Urine AMBER (A) YELLOW   APPearance CLOUDY (A) CLEAR   Specific Gravity, Urine 1.024 1.005 - 1.030   pH 6.0 5.0 - 8.0   Glucose, UA NEGATIVE NEGATIVE mg/dL   Hgb urine dipstick NEGATIVE NEGATIVE   Bilirubin Urine NEGATIVE NEGATIVE   Ketones, ur NEGATIVE NEGATIVE mg/dL   Protein, ur >=161 (A) NEGATIVE mg/dL   Nitrite NEGATIVE NEGATIVE   Leukocytes, UA TRACE (A) NEGATIVE   RBC / HPF 0-5 0 - 5 RBC/hpf   WBC, UA 21-50 0 - 5 WBC/hpf   Bacteria, UA RARE (A) NONE SEEN   Squamous Epithelial / LPF  21-50 0 - 5   Mucus PRESENT     Assessment and Plan  Pelvic pressure in pregnancy, antepartum, third trimester  - .Information provided on abdominal pain in pregnancy  UTI (urinary tract infection) during pregnancy, third trimester  - Rx for Keflex 500 mg BID x 14 days - Information provided on UTI in pregnancy   - Discharge patient - Keep scheduled appt on 06/04/18 - Patient verbalized an understanding of the plan of care and agrees.     Raelyn Mora, MSN, CNM 06/02/2018, 12:52 AM

## 2018-06-03 LAB — CULTURE, OB URINE: Culture: >=100000 — AB

## 2018-06-04 ENCOUNTER — Encounter: Payer: Self-pay | Admitting: Obstetrics and Gynecology

## 2018-06-04 ENCOUNTER — Ambulatory Visit (INDEPENDENT_AMBULATORY_CARE_PROVIDER_SITE_OTHER): Payer: 59 | Admitting: Obstetrics and Gynecology

## 2018-06-04 VITALS — BP 118/71 | HR 97 | Wt 286.9 lb

## 2018-06-04 DIAGNOSIS — Z3480 Encounter for supervision of other normal pregnancy, unspecified trimester: Secondary | ICD-10-CM

## 2018-06-04 DIAGNOSIS — Z3482 Encounter for supervision of other normal pregnancy, second trimester: Secondary | ICD-10-CM

## 2018-06-04 DIAGNOSIS — O2342 Unspecified infection of urinary tract in pregnancy, second trimester: Secondary | ICD-10-CM

## 2018-06-04 MED ORDER — CEPHALEXIN 500 MG PO CAPS: 500.0000 mg | ORAL_CAPSULE | Freq: Every day | ORAL | 5 refills | Status: DC

## 2018-06-04 NOTE — Progress Notes (Signed)
Per pink note pt needs UTI suppresson.  Urine culture done on 05/31/18 at MAU Returned to MAU on 06/01/18 for pain 9/10 and pressure.     cc: No complaints of pain or pressure or dysuria today per pt

## 2018-06-04 NOTE — Progress Notes (Signed)
Subjective:  Traci Castro is a 22 y.o. G3P1011 at [redacted]w[redacted]d being seen today for ongoing prenatal care.  She is currently monitored for the following issues for this low-risk pregnancy and has BMI 45.0-49.9, adult (HCC); [name], antepartum; [name], antepartum; [name]; UTI (urinary tract infection) during pregnancy, second trimester; and Pelvic pressure in pregnancy, antepartum, third trimester on their problem list.  Patient reports recently seen in MAU for UTI.Sx better since starting antibiotics. .  Contractions: Not present. Vag. Bleeding: None.  Movement: Present. Denies leaking of fluid.   The following portions of the patient's history were reviewed and updated as appropriate: allergies, current medications, past family history, past medical history, past social history, past surgical history and problem list. Problem list updated.  Objective:   Vitals:   06/04/18 1326  BP: 118/71  Pulse: 97  Weight: 286 lb 14.4 oz (130.1 kg)    Fetal Status: Fetal Heart Rate (bpm): 132   Movement: Present     General:  Alert, oriented and cooperative. Patient is in no acute distress.  Skin: Skin is warm and dry. No rash noted.   Cardiovascular: Normal heart rate noted  Respiratory: Normal respiratory effort, no problems with respiration noted  Abdomen: Soft, gravid, appropriate for gestational age. Pain/Pressure: Absent     Pelvic:  Cervical exam deferred        Extremities: Normal range of motion.  Edema: None  Mental Status: Normal mood and affect. Normal behavior. Normal judgment and thought content.   Urinalysis:      Assessment and Plan:  Pregnancy: G3P1011 at [redacted]w[redacted]d  1. [name], antepartum Stable  2. UTI (urinary tract infection) during pregnancy, second trimester Will begin suppression Tx . Discussed with pt.  - cephALEXin (KEFLEX) 500 MG capsule; Take 1 capsule (500 mg total) by mouth at  bedtime.  Dispense: 30 capsule; Refill: 5  Preterm labor symptoms and general obstetric precautions including but not limited to vaginal bleeding, contractions, leaking of fluid and fetal movement were reviewed in detail with the patient. Please refer to After Visit Summary for other counseling recommendations.  Return in about 2 weeks (around 06/18/2018) for OB visit.   Hermina Staggers, MD

## 2018-06-04 NOTE — Patient Instructions (Signed)
Third Trimester of Pregnancy The third trimester is from week 28 through week 40 (months 7 through 9). The third trimester is a time when the unborn baby (fetus) is growing rapidly. At the end of the ninth month, the fetus is about 20 inches in length and weighs 6-10 pounds. Body changes during your third trimester Your body will continue to go through many changes during pregnancy. The changes vary from woman to woman. During the third trimester:  Your weight will continue to increase. You can expect to gain 25-35 pounds (11-16 kg) by the end of the pregnancy.  You may begin to get stretch marks on your hips, abdomen, and breasts.  You may urinate more often because the fetus is moving lower into your pelvis and pressing on your bladder.  You may develop or continue to have heartburn. This is caused by increased hormones that slow down muscles in the digestive tract.  You may develop or continue to have constipation because increased hormones slow digestion and cause the muscles that push waste through your intestines to relax.  You may develop hemorrhoids. These are swollen veins (varicose veins) in the rectum that can itch or be painful.  You may develop swollen, bulging veins (varicose veins) in your legs.  You may have increased body aches in the pelvis, back, or thighs. This is due to weight gain and increased hormones that are relaxing your joints.  You may have changes in your hair. These can include thickening of your hair, rapid growth, and changes in texture. Some women also have hair loss during or after pregnancy, or hair that feels dry or thin. Your hair will most likely return to normal after your baby is born.  Your breasts will continue to grow and they will continue to become tender. A yellow fluid (colostrum) may leak from your breasts. This is the first milk you are producing for your baby.  Your belly button may stick out.  You may notice more swelling in your hands,  face, or ankles.  You may have increased tingling or numbness in your hands, arms, and legs. The skin on your belly may also feel numb.  You may feel short of breath because of your expanding uterus.  You may have more problems sleeping. This can be caused by the size of your belly, increased need to urinate, and an increase in your body's metabolism.  You may notice the fetus "dropping," or moving lower in your abdomen (lightening).  You may have increased vaginal discharge.  You may notice your joints feel loose and you may have pain around your pelvic bone.  What to expect at prenatal visits You will have prenatal exams every 2 weeks until week 36. Then you will have weekly prenatal exams. During a routine prenatal visit:  You will be weighed to make sure you and the baby are growing normally.  Your blood pressure will be taken.  Your abdomen will be measured to track your baby's growth.  The fetal heartbeat will be listened to.  Any test results from the previous visit will be discussed.  You may have a cervical check near your due date to see if your cervix has softened or thinned (effaced).  You will be tested for Group B streptococcus. This happens between 35 and 37 weeks.  Your health care provider may ask you:  What your birth plan is.  How you are feeling.  If you are feeling the baby move.  If you have had   any abnormal symptoms, such as leaking fluid, bleeding, severe headaches, or abdominal cramping.  If you are using any tobacco products, including cigarettes, chewing tobacco, and electronic cigarettes.  If you have any questions.  Other tests or screenings that may be performed during your third trimester include:  Blood tests that check for low iron levels (anemia).  Fetal testing to check the health, activity level, and growth of the fetus. Testing is done if you have certain medical conditions or if there are problems during the  pregnancy.  Nonstress test (NST). This test checks the health of your baby to make sure there are no signs of problems, such as the baby not getting enough oxygen. During this test, a belt is placed around your belly. The baby is made to move, and its heart rate is monitored during movement.  What is false labor? False labor is a condition in which you feel small, irregular tightenings of the muscles in the womb (contractions) that usually go away with rest, changing position, or drinking water. These are called Braxton Hicks contractions. Contractions may last for hours, days, or even weeks before true labor sets in. If contractions come at regular intervals, become more frequent, increase in intensity, or become painful, you should see your health care provider. What are the signs of labor?  Abdominal cramps.  Regular contractions that start at 10 minutes apart and become stronger and more frequent with time.  Contractions that start on the top of the uterus and spread down to the lower abdomen and back.  Increased pelvic pressure and dull back pain.  A watery or bloody mucus discharge that comes from the vagina.  Leaking of amniotic fluid. This is also known as your "water breaking." It could be a slow trickle or a gush. Let your health care provider know if it has a color or strange odor. If you have any of these signs, call your health care provider right away, even if it is before your due date. Follow these instructions at home: Medicines  Follow your health care provider's instructions regarding medicine use. Specific medicines may be either safe or unsafe to take during pregnancy.  Take a prenatal vitamin that contains at least 600 micrograms (mcg) of folic acid.  If you develop constipation, try taking a stool softener if your health care provider approves. Eating and drinking  Eat a balanced diet that includes fresh fruits and vegetables, whole grains, good sources of protein  such as meat, eggs, or tofu, and low-fat dairy. Your health care provider will help you determine the amount of weight gain that is right for you.  Avoid raw meat and uncooked cheese. These carry germs that can cause birth defects in the baby.  If you have low calcium intake from food, talk to your health care provider about whether you should take a daily calcium supplement.  Eat four or five small meals rather than three large meals a day.  Limit foods that are high in fat and processed sugars, such as fried and sweet foods.  To prevent constipation: ? Drink enough fluid to keep your urine clear or pale yellow. ? Eat foods that are high in fiber, such as fresh fruits and vegetables, whole grains, and beans. Activity  Exercise only as directed by your health care provider. Most women can continue their usual exercise routine during pregnancy. Try to exercise for 30 minutes at least 5 days a week. Stop exercising if you experience uterine contractions.  Avoid heavy   lifting.  Do not exercise in extreme heat or humidity, or at high altitudes.  Wear low-heel, comfortable shoes.  Practice good posture.  You may continue to have sex unless your health care provider tells you otherwise. Relieving pain and discomfort  Take frequent breaks and rest with your legs elevated if you have leg cramps or low back pain.  Take warm sitz baths to soothe any pain or discomfort caused by hemorrhoids. Use hemorrhoid cream if your health care provider approves.  Wear a good support bra to prevent discomfort from breast tenderness.  If you develop varicose veins: ? Wear support pantyhose or compression stockings as told by your healthcare provider. ? Elevate your feet for 15 minutes, 3-4 times a day. Prenatal care  Write down your questions. Take them to your prenatal visits.  Keep all your prenatal visits as told by your health care provider. This is important. Safety  Wear your seat belt at  all times when driving.  Make a list of emergency phone numbers, including numbers for family, friends, the hospital, and police and fire departments. General instructions  Avoid cat litter boxes and soil used by cats. These carry germs that can cause birth defects in the baby. If you have a cat, ask someone to clean the litter box for you.  Do not travel far distances unless it is absolutely necessary and only with the approval of your health care provider.  Do not use hot tubs, steam rooms, or saunas.  Do not drink alcohol.  Do not use any products that contain nicotine or tobacco, such as cigarettes and e-cigarettes. If you need help quitting, ask your health care provider.  Do not use any medicinal herbs or unprescribed drugs. These chemicals affect the formation and growth of the baby.  Do not douche or use tampons or scented sanitary pads.  Do not cross your legs for long periods of time.  To prepare for the arrival of your baby: ? Take prenatal classes to understand, practice, and ask questions about labor and delivery. ? Make a trial run to the hospital. ? Visit the hospital and tour the maternity area. ? Arrange for maternity or paternity leave through employers. ? Arrange for family and friends to take care of pets while you are in the hospital. ? Purchase a rear-facing car seat and make sure you know how to install it in your car. ? Pack your hospital bag. ? Prepare the baby's nursery. Make sure to remove all pillows and stuffed animals from the baby's crib to prevent suffocation.  Visit your dentist if you have not gone during your pregnancy. Use a soft toothbrush to brush your teeth and be gentle when you floss. Contact a health care provider if:  You are unsure if you are in labor or if your water has broken.  You become dizzy.  You have mild pelvic cramps, pelvic pressure, or nagging pain in your abdominal area.  You have lower back pain.  You have persistent  nausea, vomiting, or diarrhea.  You have an unusual or bad smelling vaginal discharge.  You have pain when you urinate. Get help right away if:  Your water breaks before 37 weeks.  You have regular contractions less than 5 minutes apart before 37 weeks.  You have a fever.  You are leaking fluid from your vagina.  You have spotting or bleeding from your vagina.  You have severe abdominal pain or cramping.  You have rapid weight loss or weight gain.    You have shortness of breath with chest pain.  You notice sudden or extreme swelling of your face, hands, ankles, feet, or legs.  Your baby makes fewer than 10 movements in 2 hours.  You have severe headaches that do not go away when you take medicine.  You have vision changes. Summary  The third trimester is from week 28 through week 40, months 7 through 9. The third trimester is a time when the unborn baby (fetus) is growing rapidly.  During the third trimester, your discomfort may increase as you and your baby continue to gain weight. You may have abdominal, leg, and back pain, sleeping problems, and an increased need to urinate.  During the third trimester your breasts will keep growing and they will continue to become tender. A yellow fluid (colostrum) may leak from your breasts. This is the first milk you are producing for your baby.  False labor is a condition in which you feel small, irregular tightenings of the muscles in the womb (contractions) that eventually go away. These are called Braxton Hicks contractions. Contractions may last for hours, days, or even weeks before true labor sets in.  Signs of labor can include: abdominal cramps; regular contractions that start at 10 minutes apart and become stronger and more frequent with time; watery or bloody mucus discharge that comes from the vagina; increased pelvic pressure and dull back pain; and leaking of amniotic fluid. This information is not intended to replace advice  given to you by your health care provider. Make sure you discuss any questions you have with your health care provider. Document Released: 08/07/2001 Document Revised: 01/19/2016 Document Reviewed: 10/14/2012 [employer] Interactive Patient Education  2017 [employer]  

## 2018-06-19 ENCOUNTER — Encounter: Payer: 59 | Admitting: Certified Nurse Midwife

## 2018-06-19 ENCOUNTER — Encounter: Payer: Self-pay | Admitting: *Deleted

## 2018-06-30 ENCOUNTER — Ambulatory Visit (INDEPENDENT_AMBULATORY_CARE_PROVIDER_SITE_OTHER): Payer: 59 | Admitting: Advanced Practice Midwife

## 2018-06-30 VITALS — BP 110/65 | HR 97 | Wt 279.2 lb

## 2018-06-30 DIAGNOSIS — O99891 Other specified diseases and conditions complicating pregnancy: Secondary | ICD-10-CM

## 2018-06-30 DIAGNOSIS — M7918 Myalgia, other site: Secondary | ICD-10-CM

## 2018-06-30 DIAGNOSIS — Z3480 Encounter for supervision of other normal pregnancy, unspecified trimester: Secondary | ICD-10-CM

## 2018-06-30 DIAGNOSIS — O9989 Other specified diseases and conditions complicating pregnancy, childbirth and the puerperium: Secondary | ICD-10-CM

## 2018-06-30 NOTE — Progress Notes (Signed)
   PRENATAL VISIT NOTE  Subjective:  Traci Castro is a 22 y.o. G3P1011 at [redacted]w[redacted]d being seen today for ongoing prenatal care.  She is currently monitored for the following issues for this low-risk pregnancy and has BMI 45.0-49.9, adult (HCC); [name], antepartum; [name], antepartum; [name]; UTI (urinary tract infection) during pregnancy, second trimester; and Pelvic pressure in pregnancy, antepartum, third trimester on their problem list.  Patient reports pelvic pain when walking, getting out of bed..  Contractions: Not present. Vag. Bleeding: None.  Movement: Present. Denies leaking of fluid.   The following portions of the patient's history were reviewed and updated as appropriate: allergies, current medications, past family history, past medical history, past social history, past surgical history and problem list. Problem list updated.  Objective:   Vitals:   06/30/18 1557  BP: 110/65  Pulse: 97  Weight: 126.6 kg    Fetal Status: Fetal Heart Rate (bpm): 118   Movement: Present     General:  Alert, oriented and cooperative. Patient is in no acute distress.  Skin: Skin is warm and dry. No rash noted.   Cardiovascular: Normal heart rate noted  Respiratory: Normal respiratory effort, no problems with respiration noted  Abdomen: Soft, gravid, appropriate for gestational age.  Pain/Pressure: Absent     Pelvic: Cervical exam deferred        Extremities: Normal range of motion.  Edema: None  Mental Status: Normal mood and affect. Normal behavior. Normal judgment and thought content.   Assessment and Plan:  Pregnancy: G3P1011 at [redacted]w[redacted]d  1. [name], antepartum --Anticipatory guidance about next visits/weeks of pregnancy given.  2. Pain in symphysis pubis during pregnancy --Pt is significant, getting in way of activities, taking care of other child.   --Teaching about pubic symphysis pain done,  with websites, exercises. - Ambulatory referral to Physical Therapy  Preterm labor symptoms and general obstetric precautions including but not limited to vaginal bleeding, contractions, leaking of fluid and fetal movement were reviewed in detail with the patient. Please refer to After Visit Summary for other counseling recommendations.  No follow-ups on file.  No future appointments.  Sharen Counter, CNM

## 2018-06-30 NOTE — Progress Notes (Signed)
Pt is here for ROB. G3P1 [redacted]w[redacted]d

## 2018-06-30 NOTE — Patient Instructions (Addendum)
Third Trimester of Pregnancy The third trimester is from week 28 through week 40 (months 7 through 9). The third trimester is a time when the unborn baby (fetus) is growing rapidly. At the end of the ninth month, the fetus is about 20 inches in length and weighs 6-10 pounds. Body changes during your third trimester Your body will continue to go through many changes during pregnancy. The changes vary from woman to woman. During the third trimester:  Your weight will continue to increase. You can expect to gain 25-35 pounds (11-16 kg) by the end of the pregnancy.  You may begin to get stretch marks on your hips, abdomen, and breasts.  You may urinate more often because the fetus is moving lower into your pelvis and pressing on your bladder.  You may develop or continue to have heartburn. This is caused by increased hormones that slow down muscles in the digestive tract.  You may develop or continue to have constipation because increased hormones slow digestion and cause the muscles that push waste through your intestines to relax.  You may develop hemorrhoids. These are swollen veins (varicose veins) in the rectum that can itch or be painful.  You may develop swollen, bulging veins (varicose veins) in your legs.  You may have increased body aches in the pelvis, back, or thighs. This is due to weight gain and increased hormones that are relaxing your joints.  You may have changes in your hair. These can include thickening of your hair, rapid growth, and changes in texture. Some women also have hair loss during or after pregnancy, or hair that feels dry or thin. Your hair will most likely return to normal after your baby is born.  Your breasts will continue to grow and they will continue to become tender. A yellow fluid (colostrum) may leak from your breasts. This is the first milk you are producing for your baby.  Your belly button may stick out.  You may notice more swelling in your hands,  face, or ankles.  You may have increased tingling or numbness in your hands, arms, and legs. The skin on your belly may also feel numb.  You may feel short of breath because of your expanding uterus.  You may have more problems sleeping. This can be caused by the size of your belly, increased need to urinate, and an increase in your body's metabolism.  You may notice the fetus "dropping," or moving lower in your abdomen (lightening).  You may have increased vaginal discharge.  You may notice your joints feel loose and you may have pain around your pelvic bone.  What to expect at prenatal visits You will have prenatal exams every 2 weeks until week 36. Then you will have weekly prenatal exams. During a routine prenatal visit:  You will be weighed to make sure you and the baby are growing normally.  Your blood pressure will be taken.  Your abdomen will be measured to track your baby's growth.  The fetal heartbeat will be listened to.  Any test results from the previous visit will be discussed.  You may have a cervical check near your due date to see if your cervix has softened or thinned (effaced).  You will be tested for Group B streptococcus. This happens between 35 and 37 weeks.  Your health care provider may ask you:  What your birth plan is.  How you are feeling.  If you are feeling the baby move.  If you have had   any abnormal symptoms, such as leaking fluid, bleeding, severe headaches, or abdominal cramping.  If you are using any tobacco products, including cigarettes, chewing tobacco, and electronic cigarettes.  If you have any questions.  Other tests or screenings that may be performed during your third trimester include:  Blood tests that check for low iron levels (anemia).  Fetal testing to check the health, activity level, and growth of the fetus. Testing is done if you have certain medical conditions or if there are problems during the  pregnancy.  Nonstress test (NST). This test checks the health of your baby to make sure there are no signs of problems, such as the baby not getting enough oxygen. During this test, a belt is placed around your belly. The baby is made to move, and its heart rate is monitored during movement.  What is false labor? False labor is a condition in which you feel small, irregular tightenings of the muscles in the womb (contractions) that usually go away with rest, changing position, or drinking water. These are called Braxton Hicks contractions. Contractions may last for hours, days, or even weeks before true labor sets in. If contractions come at regular intervals, become more frequent, increase in intensity, or become painful, you should see your health care provider. What are the signs of labor?  Abdominal cramps.  Regular contractions that start at 10 minutes apart and become stronger and more frequent with time.  Contractions that start on the top of the uterus and spread down to the lower abdomen and back.  Increased pelvic pressure and dull back pain.  A watery or bloody mucus discharge that comes from the vagina.  Leaking of amniotic fluid. This is also known as your "water breaking." It could be a slow trickle or a gush. Let your health care provider know if it has a color or strange odor. If you have any of these signs, call your health care provider right away, even if it is before your due date. Follow these instructions at home: Medicines  Follow your health care provider's instructions regarding medicine use. Specific medicines may be either safe or unsafe to take during pregnancy.  Take a prenatal vitamin that contains at least 600 micrograms (mcg) of folic acid.  If you develop constipation, try taking a stool softener if your health care provider approves. Eating and drinking  Eat a balanced diet that includes fresh fruits and vegetables, whole grains, good sources of protein  such as meat, eggs, or tofu, and low-fat dairy. Your health care provider will help you determine the amount of weight gain that is right for you.  Avoid raw meat and uncooked cheese. These carry germs that can cause birth defects in the baby.  If you have low calcium intake from food, talk to your health care provider about whether you should take a daily calcium supplement.  Eat four or five small meals rather than three large meals a day.  Limit foods that are high in fat and processed sugars, such as fried and sweet foods.  To prevent constipation: ? Drink enough fluid to keep your urine clear or pale yellow. ? Eat foods that are high in fiber, such as fresh fruits and vegetables, whole grains, and beans. Activity  Exercise only as directed by your health care provider. Most women can continue their usual exercise routine during pregnancy. Try to exercise for 30 minutes at least 5 days a week. Stop exercising if you experience uterine contractions.  Avoid heavy   lifting.  Do not exercise in extreme heat or humidity, or at high altitudes.  Wear low-heel, comfortable shoes.  Practice good posture.  You may continue to have sex unless your health care provider tells you otherwise. Relieving pain and discomfort  Take frequent breaks and rest with your legs elevated if you have leg cramps or low back pain.  Take warm sitz baths to soothe any pain or discomfort caused by hemorrhoids. Use hemorrhoid cream if your health care provider approves.  Wear a good support bra to prevent discomfort from breast tenderness.  If you develop varicose veins: ? Wear support pantyhose or compression stockings as told by your healthcare provider. ? Elevate your feet for 15 minutes, 3-4 times a day. Prenatal care  Write down your questions. Take them to your prenatal visits.  Keep all your prenatal visits as told by your health care provider. This is important. Safety  Wear your seat belt at  all times when driving.  Make a list of emergency phone numbers, including numbers for family, friends, the hospital, and police and fire departments. General instructions  Avoid cat litter boxes and soil used by cats. These carry germs that can cause birth defects in the baby. If you have a cat, ask someone to clean the litter box for you.  Do not travel far distances unless it is absolutely necessary and only with the approval of your health care provider.  Do not use hot tubs, steam rooms, or saunas.  Do not drink alcohol.  Do not use any products that contain nicotine or tobacco, such as cigarettes and e-cigarettes. If you need help quitting, ask your health care provider.  Do not use any medicinal herbs or unprescribed drugs. These chemicals affect the formation and growth of the baby.  Do not douche or use tampons or scented sanitary pads.  Do not cross your legs for long periods of time.  To prepare for the arrival of your baby: ? Take prenatal classes to understand, practice, and ask questions about labor and delivery. ? Make a trial run to the hospital. ? Visit the hospital and tour the maternity area. ? Arrange for maternity or paternity leave through employers. ? Arrange for family and friends to take care of pets while you are in the hospital. ? Purchase a rear-facing car seat and make sure you know how to install it in your car. ? Pack your hospital bag. ? Prepare the baby's nursery. Make sure to remove all pillows and stuffed animals from the baby's crib to prevent suffocation.  Visit your dentist if you have not gone during your pregnancy. Use a soft toothbrush to brush your teeth and be gentle when you floss. Contact a health care provider if:  You are unsure if you are in labor or if your water has broken.  You become dizzy.  You have mild pelvic cramps, pelvic pressure, or nagging pain in your abdominal area.  You have lower back pain.  You have persistent  nausea, vomiting, or diarrhea.  You have an unusual or bad smelling vaginal discharge.  You have pain when you urinate. Get help right away if:  Your water breaks before 37 weeks.  You have regular contractions less than 5 minutes apart before 37 weeks.  You have a fever.  You are leaking fluid from your vagina.  You have spotting or bleeding from your vagina.  You have severe abdominal pain or cramping.  You have rapid weight loss or weight gain.    You have shortness of breath with chest pain.  You notice sudden or extreme swelling of your face, hands, ankles, feet, or legs.  Your baby makes fewer than 10 movements in 2 hours.  You have severe headaches that do not go away when you take medicine.  You have vision changes. Summary  The third trimester is from week 28 through week 40, months 7 through 9. The third trimester is a time when the unborn baby (fetus) is growing rapidly.  During the third trimester, your discomfort may increase as you and your baby continue to gain weight. You may have abdominal, leg, and back pain, sleeping problems, and an increased need to urinate.  During the third trimester your breasts will keep growing and they will continue to become tender. A yellow fluid (colostrum) may leak from your breasts. This is the first milk you are producing for your baby.  False labor is a condition in which you feel small, irregular tightenings of the muscles in the womb (contractions) that eventually go away. These are called Braxton Hicks contractions. Contractions may last for hours, days, or even weeks before true labor sets in.  Signs of labor can include: abdominal cramps; regular contractions that start at 10 minutes apart and become stronger and more frequent with time; watery or bloody mucus discharge that comes from the vagina; increased pelvic pressure and dull back pain; and leaking of amniotic fluid. This information is not intended to replace advice  given to you by your health care provider. Make sure you discuss any questions you have with your health care provider. Document Released: 08/07/2001 Document Revised: 01/19/2016 Document Reviewed: 10/14/2012 Elsevier Interactive Patient Education  2017 [employer]  Pubic Symphysis Pain/Dysfunction  What is symphysis pubis dysfunction?  Symphysis pubis dysfunction (SPD) is a problem with the pelvis. Your pelvis is mainly formed of two pubic bones that curve round to make a cradle shape. The pubic bones meet at the front of your pelvis, at a firm joint called the symphysis pubis.   The joint's connection is made strong by a dense network of tough tissues (ligaments). During pregnancy, swelling and pain can make the symphysis pubis joint less stable, causing SPD.  Doctors and physiotherapists classify any type of pelvic pain during pregnancy as pelvic girdle pain (PGP).   SPD is one type of pelvic girdle pain. Diastasis symphysis pubis (DSP) is another type of pelvic girdle pain, which is related to SPD. DSP happens when the gap in the symphysis pubis joint widens too far. DSP is rare, and can only be diagnosed by an X-ray, ultrasound scan or MRI scan. What are the symptoms of SPD? Pain in the pubic area and groin are the most common symptoms, though you may also notice:   Back pain, pain at the back of your pelvis or hip pain.  Pain, along with a grinding or clicking sensation in your pubic area.  Pain down the inside of your thighs or between your legs.  Pain that's made worse by parting your legs, walking, going up or down stairs or moving around in bed.  Pain that's worse at night and stops you from sleeping well. Getting up to go to the toilet in the middle of the night can be especially painful.  SPD can occur at any time during your pregnancy or after giving birth. You may notice it for the first time during the middle of your pregnancy. What causes SPD?During pregnancy, your body  produces a hormone called  relaxin, which softens your ligaments to help your baby pass through your pelvis. This means that the joints in your pelvis naturally become more lax.   However, this flexibility doesn't necessarily cause the painful problems of SPD. Usually, your nerves and muscles are able to adapt and compensate for the greater flexibility in your joints. This means your body should cope well with the changes to your posture as your baby grows.  SPD is thought to happen when your body doesn't adapt so well to the stretchier, looser ligaments caused by relaxin. SPD can be triggered by:  the joints in your pelvis moving unevenly  changes to the way your muscles work to support your pelvic girdle joints  one pelvic joint not working properly and causing knock-on pain in the other joints of your pelvis These problems mean that your pelvis is not as stable as it should be, and this is what causes SPD. Physiotherapy is the best way to treat SPD, because it's about the relationship between your muscles and bones, rather than how lax your joints are. You're more likely to develop SPD if:  you had pelvic girdle pain or pelvic joint pain before you became pregnant  you've had a previous injury to your pelvis  you've had pelvic girdle pain in a previous pregnancy  you have a high BMI and were overweight before you became pregnant  hypermobility in all your joints  How is SPD diagnosed? Your doctor or midwife should refer you to a women's health physiotherapist. Your physiotherapist will test the stability, movement and pain in your pelvic joints and muscles. How is SPD treated?SPD is managed in the same way as other pelvic girdle pain. Treatment includes:   Exercises to strengthen your spinal, tummy, pelvic girdle, hip and pelvic floor muscles. These will improve the stability of your pelvis and back. You may need gentle, hands-on treatment of your hip, back or pelvis to correct stiffness or  imbalance. Water gymnastics can sometimes help.  Your physiotherapist should advise you on how to make daily activities less painful and on how to make the birth of your baby easier. Your midwife should help you to write a birth plan that takes into account your SPD symptoms.  Acupuncture may help reduce the pain and is safe during pregnancy. Make sure your practitioner is trained and experienced in working with pregnant women.  Other manual therapies, such as osteopathy may help. See a registered practitioner who is experienced in treating pregnant women.  A pelvic support belt may give relief, particularly when you're exercising or active. What can I do to ease the pain of SPD?  Be as active as you can, but don't push yourself so far that it hurts.  Stick to the pelvic floor and tummy exercises that your physiotherapist recommends.  Ask for and accept offers of help with daily chores.  Plan ahead so that you reduce the activities that cause you problems. You could use a rucksack to carry things around, both indoors and out.  Take care to part your legs no further than your pain-free range, particularly when getting in and out of the car, bed or bath. If you are lying down, pull up your knees as far as you can to make it easier to part your legs. If you are sitting, try arching your back and sticking your chest out before parting or moving your legs.  Avoid activities that make your pain worse or that put your pelvis in an uneven position,  such as sitting cross-legged or carrying your toddler on your hip. If something hurts, stop doing it. If the pain is allowed to flare up, it can take a long time to settle down again.  Try to sleep on your side with legs bent and a pillow between your knees.  Rest regularly or sit down for activities you would normally do standing, such as ironing. By sitting on a birth ball or by getting down on your hands and knees, you'll take the weight of your baby off your  pelvis.  Try not to do heavy lifting or pushing. Pushing supermarket trolleys can often make your pain worse, so shop online or ask someone to shop for you.  When climbing stairs, take one step at a time. Step up onto one step with your best leg and then bring your other leg to meet it. Repeat with each step.  Avoid standing on one leg. When getting dressed, sit down to pull on your knickers or trousers. Will I recover from SPD after I've had my baby ? You're very likely to recover within a few weeks to a few months after your baby is born. If you can, carry on with physiotherapy after the birth. Try to get help with looking after your baby during the early weeks.   You may find you get twinges every month just before your period is due. This is likely to be caused by hormones that have a similar effect to pregnancy hormones.   If you have SPD in one pregnancy, it is more likely that you'll have it next time you get pregnant. Ask your midwife to refer you to a physiotherapist early on. SPD may not necessarily be as bad next time if it is well managed from the start of pregnancy.   You could consider giving yourself a bit of time from one pregnancy to the next. Losing excess weight, getting fit and waiting until your children can walk may help reduce the symptoms of getting any type of pelvic pain next time. Where can I get help and support?[facility] can provide a list of physiotherapists in your area.  You can get in touch with other women in your situation by contacting [employer], a charity that offers support to women with pelvic girdle pain, including SPD, or by visiting our community.   More tips and advice:  Go to http://www.babycentre.co.uk/a546492/pelvic-pain-spd for more information  See our photo guide to pregnancy stretches, designed to help relieve those aches and pains.  Watch our video for tips on how to get comfortable in bed   Discover how SPD might affect your labour.  Last reviewed: July 2015  Next review: July 2018

## 2018-07-03 ENCOUNTER — Ambulatory Visit: Payer: 59 | Admitting: Physical Therapy

## 2018-07-14 ENCOUNTER — Encounter: Payer: Self-pay | Admitting: Certified Nurse Midwife

## 2018-07-14 ENCOUNTER — Ambulatory Visit (INDEPENDENT_AMBULATORY_CARE_PROVIDER_SITE_OTHER): Payer: 59 | Admitting: Certified Nurse Midwife

## 2018-07-14 VITALS — BP 141/78 | HR 97 | Wt 278.0 lb

## 2018-07-14 DIAGNOSIS — O2343 Unspecified infection of urinary tract in pregnancy, third trimester: Secondary | ICD-10-CM

## 2018-07-14 DIAGNOSIS — O9989 Other specified diseases and conditions complicating pregnancy, childbirth and the puerperium: Secondary | ICD-10-CM

## 2018-07-14 DIAGNOSIS — O99891 Other specified diseases and conditions complicating pregnancy: Secondary | ICD-10-CM

## 2018-07-14 DIAGNOSIS — Z283 Underimmunization status: Secondary | ICD-10-CM

## 2018-07-14 DIAGNOSIS — O2342 Unspecified infection of urinary tract in pregnancy, second trimester: Secondary | ICD-10-CM

## 2018-07-14 DIAGNOSIS — N949 Unspecified condition associated with female genital organs and menstrual cycle: Secondary | ICD-10-CM

## 2018-07-14 DIAGNOSIS — Z2839 Other underimmunization status: Secondary | ICD-10-CM

## 2018-07-14 DIAGNOSIS — Z3483 Encounter for supervision of other normal pregnancy, third trimester: Secondary | ICD-10-CM

## 2018-07-14 DIAGNOSIS — O09899 Supervision of other high risk pregnancies, unspecified trimester: Secondary | ICD-10-CM

## 2018-07-14 DIAGNOSIS — R12 Heartburn: Secondary | ICD-10-CM

## 2018-07-14 DIAGNOSIS — O26893 Other specified pregnancy related conditions, third trimester: Secondary | ICD-10-CM

## 2018-07-14 DIAGNOSIS — Z3480 Encounter for supervision of other normal pregnancy, unspecified trimester: Secondary | ICD-10-CM

## 2018-07-14 MED ORDER — FAMOTIDINE 20 MG PO TABS: 20.0000 mg | ORAL_TABLET | Freq: Two times a day (BID) | ORAL | 0 refills | Status: DC

## 2018-07-14 NOTE — Progress Notes (Signed)
   PRENATAL VISIT NOTE  Subjective:  Traci Castro is a 22 y.o. G3P1011 at 4336w1d being seen today for ongoing prenatal care.  She is currently monitored for the following issues for this low-risk pregnancy and has BMI 45.0-49.9, adult (HCC); [name], antepartum; [name], antepartum; [name]; UTI (urinary tract infection) during pregnancy, second trimester; and Pelvic pressure in pregnancy, antepartum, third trimester on their problem list.  Patient reports occasional contractions and pelvic pressure.  Contractions: Irritability. Vag. Bleeding: None.  Movement: Present. Denies leaking of fluid.   The following portions of the patient's history were reviewed and updated as appropriate: allergies, current medications, past family history, past medical history, past social history, past surgical history and problem list. Problem list updated.  Objective:   Vitals:   07/14/18 1538  BP: (!) 141/78  Pulse: 97  Weight: 278 lb (126.1 kg)    Fetal Status: Fetal Heart Rate (bpm): 152 Fundal Height: 38 cm Movement: Present     General:  Alert, oriented and cooperative. Patient is in no acute distress.  Skin: Skin is warm and dry. No rash noted.   Cardiovascular: Normal heart rate noted  Respiratory: Normal respiratory effort, no problems with respiration noted  Abdomen: Soft, gravid, appropriate for gestational age.  Pain/Pressure: Present     Pelvic: Cervical exam performed Dilation: Fingertip Effacement (%): Thick Station: Ballotable  Extremities: Normal range of motion.  Edema: None  Mental Status: Normal mood and affect. Normal behavior. Normal judgment and thought content.   Assessment and Plan:  Pregnancy: G3P1011 at 6636w1d  1. [name], antepartum - Patient doing well, reports worsening pelvic pressure over the past 3-4 days- request cervical examination  - Anticipatory guidance on upcoming  appointments  - US scheduled for fetal growth  - US MFM OB FOLLOW UP; Future  2. [name], antepartum - Needs PP   3. Pelvic pressure in pregnancy, antepartum, third trimester - PT scheduled for 11/20   4. UTI (urinary tract infection) during pregnancy, second trimester - on suppression   5. Heartburn during pregnancy in third trimester - famotidine (PEPCID) 20 MG tablet; Take 1 tablet (20 mg total) by mouth 2 (two) times daily.  Dispense: 60 tablet; Refill: 0  Preterm labor symptoms and general obstetric precautions including but not limited to vaginal bleeding, contractions, leaking of fluid and fetal movement were reviewed in detail with the patient. Please refer to After Visit Summary for other counseling recommendations.  Return in about 1 week (around 07/21/2018) for ROB.  Future Appointments  Date Time Provider Department Center  07/15/2018  3:30 PM [facility] US 1 [facility]  07/16/2018  2:45 PM Beuhring, Doyce ParaJohanna E, PT [facility]  07/21/2018  2:45 PM Leftwich-Kirby, Wilmer FloorLisa A, CNM [facility] None    Sharyon CableVeronica C Blease Capaldi, CNM

## 2018-07-14 NOTE — Progress Notes (Signed)
Pt requesting cervix check  C/O : increased vaginal pressure.

## 2018-07-14 NOTE — Patient Instructions (Signed)
Reasons to go to [facility]:  1.  Contractions are  3-4 minutes apart or less, each last 1 minute, these have been going on for 1-2 hours, and you cannot walk or talk during them 2.  You have a large gush of fluid, or a trickle of fluid that will not stop and you have to wear a pad 3.  You have bleeding that is bright red, heavier than spotting--like menstrual bleeding (spotting can be normal in early labor or after a check of your cervix) 4.  You do not feel the baby moving like he/she normally does  

## 2018-07-15 ENCOUNTER — Ambulatory Visit (HOSPITAL_BASED_OUTPATIENT_CLINIC_OR_DEPARTMENT_OTHER)
Admission: RE | Admit: 2018-07-15 | Discharge: 2018-07-15 | Disposition: A | Discharge status: Home / Self Care | Payer: 59 | Source: Ambulatory Visit | Attending: Certified Nurse Midwife | Admitting: Certified Nurse Midwife

## 2018-07-15 ENCOUNTER — Other Ambulatory Visit (HOSPITAL_COMMUNITY): Payer: Self-pay | Admitting: *Deleted

## 2018-07-15 ENCOUNTER — Encounter (HOSPITAL_COMMUNITY): Payer: Self-pay | Admitting: *Deleted

## 2018-07-15 ENCOUNTER — Other Ambulatory Visit: Payer: Self-pay

## 2018-07-15 ENCOUNTER — Observation Stay (HOSPITAL_COMMUNITY)
Admission: AD | Admit: 2018-07-15 | Discharge: 2018-07-17 | Disposition: A | Discharge status: Home / Self Care | Payer: 59 | Source: Ambulatory Visit | Attending: Family Medicine | Admitting: Family Medicine

## 2018-07-15 DIAGNOSIS — Z79899 Other long term (current) drug therapy: Secondary | ICD-10-CM | POA: Diagnosis not present

## 2018-07-15 DIAGNOSIS — O368330 Maternal care for abnormalities of the fetal heart rate or rhythm, third trimester, not applicable or unspecified: Secondary | ICD-10-CM

## 2018-07-15 DIAGNOSIS — Z3A35 35 weeks gestation of pregnancy: Secondary | ICD-10-CM

## 2018-07-15 DIAGNOSIS — O99213 Obesity complicating pregnancy, third trimester: Secondary | ICD-10-CM | POA: Insufficient documentation

## 2018-07-15 DIAGNOSIS — O99013 Anemia complicating pregnancy, third trimester: Secondary | ICD-10-CM

## 2018-07-15 DIAGNOSIS — O288 Other abnormal findings on antenatal screening of mother: Secondary | ICD-10-CM

## 2018-07-15 DIAGNOSIS — E669 Obesity, unspecified: Secondary | ICD-10-CM | POA: Insufficient documentation

## 2018-07-15 DIAGNOSIS — R51 Headache: Secondary | ICD-10-CM | POA: Diagnosis not present

## 2018-07-15 DIAGNOSIS — O99210 Obesity complicating pregnancy, unspecified trimester: Secondary | ICD-10-CM | POA: Diagnosis present

## 2018-07-15 DIAGNOSIS — O26843 Uterine size-date discrepancy, third trimester: Secondary | ICD-10-CM

## 2018-07-15 DIAGNOSIS — Z3480 Encounter for supervision of other normal pregnancy, unspecified trimester: Secondary | ICD-10-CM

## 2018-07-15 DIAGNOSIS — O2653 Maternal hypotension syndrome, third trimester: Secondary | ICD-10-CM | POA: Insufficient documentation

## 2018-07-15 DIAGNOSIS — Z362 Encounter for other antenatal screening follow-up: Secondary | ICD-10-CM

## 2018-07-15 DIAGNOSIS — O9989 Other specified diseases and conditions complicating pregnancy, childbirth and the puerperium: Secondary | ICD-10-CM | POA: Insufficient documentation

## 2018-07-15 DIAGNOSIS — O368390 Maternal care for abnormalities of the fetal heart rate or rhythm, unspecified trimester, not applicable or unspecified: Secondary | ICD-10-CM | POA: Diagnosis present

## 2018-07-15 DIAGNOSIS — I959 Hypotension, unspecified: Secondary | ICD-10-CM | POA: Diagnosis present

## 2018-07-15 LAB — URINALYSIS, ROUTINE W REFLEX MICROSCOPIC
Bilirubin Urine: NEGATIVE
Glucose, UA: NEGATIVE mg/dL
Hgb urine dipstick: NEGATIVE
Ketones, ur: 80 mg/dL — AB
Nitrite: NEGATIVE
Protein, ur: 100 mg/dL — AB
Specific Gravity, Urine: 1.024 (ref 1.005–1.030)
pH: 7 (ref 5.0–8.0)

## 2018-07-15 LAB — COMPREHENSIVE METABOLIC PANEL
ALT: 14 U/L (ref 0–44)
AST: 14 U/L — ABNORMAL LOW (ref 15–41)
Albumin: 3 g/dL — ABNORMAL LOW (ref 3.5–5.0)
Alkaline Phosphatase: 162 U/L — ABNORMAL HIGH (ref 38–126)
Anion gap: 11 (ref 5–15)
BUN: 6 mg/dL (ref 6–20)
CO2: 21 mmol/L — ABNORMAL LOW (ref 22–32)
Calcium: 8.8 mg/dL — ABNORMAL LOW (ref 8.9–10.3)
Chloride: 104 mmol/L (ref 98–111)
Creatinine, Ser: 0.6 mg/dL (ref 0.44–1.00)
GFR calc Af Amer: >60 mL/min (ref >60)
GFR calc non Af Amer: >60 mL/min (ref >60)
Glucose, Bld: 72 mg/dL (ref 70–99)
Potassium: 3.9 mmol/L (ref 3.5–5.1)
Sodium: 136 mmol/L (ref 135–145)
Total Bilirubin: 0.5 mg/dL (ref 0.3–1.2)
Total Protein: 6.7 g/dL (ref 6.5–8.1)

## 2018-07-15 LAB — CBC WITH DIFFERENTIAL/PLATELET
Basophils Absolute: 0 10*3/uL (ref 0.0–0.1)
Basophils Relative: 0 %
Eosinophils Absolute: 0 10*3/uL (ref 0.0–0.5)
Eosinophils Relative: 0 %
HCT: 30.6 % — ABNORMAL LOW (ref 36.0–46.0)
Hemoglobin: 9.2 g/dL — ABNORMAL LOW (ref 12.0–15.0)
Lymphocytes Relative: 28 %
Lymphs Abs: 3.4 10*3/uL (ref 0.7–4.0)
MCH: 22.8 pg — ABNORMAL LOW (ref 26.0–34.0)
MCHC: 30.1 g/dL (ref 30.0–36.0)
MCV: 75.9 fL — ABNORMAL LOW (ref 80.0–100.0)
Monocytes Absolute: 0.6 10*3/uL (ref 0.1–1.0)
Monocytes Relative: 5 %
Neutro Abs: 8.2 10*3/uL — ABNORMAL HIGH (ref 1.7–7.7)
Neutrophils Relative %: 67 %
Platelets: 331 10*3/uL (ref 150–400)
RBC: 4.03 MIL/uL (ref 3.87–5.11)
RDW: 15.4 % (ref 11.5–15.5)
WBC: 12.3 10*3/uL — ABNORMAL HIGH (ref 4.0–10.5)
nRBC: 0 % (ref 0.0–0.2)

## 2018-07-15 LAB — PROTEIN / CREATININE RATIO, URINE
Creatinine, Urine: 320 mg/dL
Protein Creatinine Ratio: 0.15 mg/mg{Cre} (ref 0.00–0.15)
Total Protein, Urine: 47 mg/dL

## 2018-07-15 LAB — POCT FERN TEST: POCT Fern Test: NEGATIVE

## 2018-07-15 MED ORDER — DOCUSATE SODIUM 100 MG PO CAPS
100.0000 mg | ORAL_CAPSULE | Freq: Every day | ORAL | Status: DC
  Administered 2018-07-16 – 2018-07-17 (×2): 100 mg via ORAL
  Filled 2018-07-15 (×2): qty 1

## 2018-07-15 MED ORDER — BETAMETHASONE SOD PHOS & ACET 6 (3-3) MG/ML IJ SUSP
12.0000 mg | Freq: Once | INTRAMUSCULAR | Status: AC
  Administered 2018-07-15: 12 mg via INTRAMUSCULAR
  Filled 2018-07-15: qty 2

## 2018-07-15 MED ORDER — PRENATAL MULTIVITAMIN CH
1.0000 | ORAL_TABLET | Freq: Every day | ORAL | Status: DC
  Administered 2018-07-16 – 2018-07-17 (×2): 1 via ORAL
  Filled 2018-07-15 (×2): qty 1

## 2018-07-15 MED ORDER — CALCIUM CARBONATE ANTACID 500 MG PO CHEW: 2.0000 | CHEWABLE_TABLET | ORAL | Status: DC | PRN

## 2018-07-15 MED ORDER — ACETAMINOPHEN 325 MG PO TABS
650.0000 mg | ORAL_TABLET | ORAL | Status: DC | PRN
  Administered 2018-07-17: 650 mg via ORAL
  Filled 2018-07-15: qty 2

## 2018-07-15 MED ORDER — LACTATED RINGERS IV BOLUS
1000.0000 mL | Freq: Once | INTRAVENOUS | Status: AC
  Administered 2018-07-15: 1000 mL via INTRAVENOUS

## 2018-07-15 MED ORDER — LACTATED RINGERS IV SOLN
INTRAVENOUS | Status: DC
  Administered 2018-07-15 – 2018-07-17 (×6): via INTRAVENOUS

## 2018-07-15 MED ORDER — CEPHALEXIN 500 MG PO CAPS
500.0000 mg | ORAL_CAPSULE | Freq: Every day | ORAL | Status: DC
  Administered 2018-07-15 – 2018-07-16 (×2): 500 mg via ORAL
  Filled 2018-07-15 (×2): qty 1

## 2018-07-15 MED ORDER — ACETAMINOPHEN 500 MG PO TABS
1000.0000 mg | ORAL_TABLET | Freq: Once | ORAL | Status: AC
  Administered 2018-07-15: 1000 mg via ORAL
  Filled 2018-07-15: qty 2

## 2018-07-15 MED ORDER — ONDANSETRON 4 MG PO TBDP
4.0000 mg | ORAL_TABLET | Freq: Three times a day (TID) | ORAL | Status: DC | PRN
  Administered 2018-07-15 – 2018-07-16 (×2): 4 mg via ORAL
  Filled 2018-07-15 (×3): qty 1

## 2018-07-15 MED ORDER — FAMOTIDINE 20 MG PO TABS
20.0000 mg | ORAL_TABLET | Freq: Two times a day (BID) | ORAL | Status: DC
  Administered 2018-07-15 – 2018-07-17 (×4): 20 mg via ORAL
  Filled 2018-07-15 (×4): qty 1

## 2018-07-15 MED ORDER — ZOLPIDEM TARTRATE 5 MG PO TABS: 5.0000 mg | ORAL_TABLET | Freq: Every evening | ORAL | Status: DC | PRN

## 2018-07-15 MED ORDER — ACETAMINOPHEN 500 MG PO TABS
500.0000 mg | ORAL_TABLET | Freq: Four times a day (QID) | ORAL | Status: DC | PRN
  Administered 2018-07-16: 500 mg via ORAL
  Filled 2018-07-15: qty 1

## 2018-07-15 MED ORDER — DOXYLAMINE-PYRIDOXINE 10-10 MG PO TBEC: 1.0000 | DELAYED_RELEASE_TABLET | Freq: Three times a day (TID) | ORAL | Status: DC | PRN

## 2018-07-15 MED ORDER — CYCLOBENZAPRINE HCL 10 MG PO TABS
10.0000 mg | ORAL_TABLET | Freq: Two times a day (BID) | ORAL | Status: DC | PRN
  Administered 2018-07-15: 10 mg via ORAL
  Filled 2018-07-15 (×2): qty 1

## 2018-07-15 NOTE — Progress Notes (Signed)
Report received from J. Rana SnareLowe, RN and care assumed by this RN.

## 2018-07-15 NOTE — [facility] Note (Signed)
Pt sent from MFM today with fetal bradycardiai, MFM 6/8.  Denies pain, bleeding or LOF.  Reports good fetal movement.

## 2018-07-15 NOTE — MAU Provider Note (Signed)
History     CSN: [id number]  Arrival date and time: 07/15/18 1621   First Provider Initiated Contact with Patient 07/15/18 1652      Chief Complaint  Patient presents with  . Low FHR   Traci Castro is a 22 y.o. G3P1 at [redacted]w[redacted]d who was sent over from MFM for bradycardia. She reports FHR was 96-110's in [place] and BPP was 6/8 (off for breathing). She has received prenatal care at [facility] She reports occasional contractions and good fetal movement. Denies vaginal bleeding, vaginal discharge, urinary symptoms or N/V. Reports having a HA earlier today, HA has continued to linger since arrival to hospital for [place]. Rates 3/10- has not taken any medication for HA. BP elevated in office one time yesterday, none prior and denies BP being elevated today.    OB History    Gravida  3   Para  1   Term  1   Preterm      AB  1   Living  1     SAB      TAB  1   Ectopic      Multiple  0   Live Births  1           Past Medical History:  Diagnosis Date  . Anemia   . Anxiety    no meds  . Bipolar disorder (HCC)   . Depression    no meds  . Gastritis    2 bouts  . H/O seasonal allergies   . Migraine    migraines  . Psoriasis   . SVD (spontaneous vaginal delivery)    x 1  . Vision abnormalities    wears glasses    Past Surgical History:  Procedure Laterality Date  . DILATION AND CURETTAGE OF UTERUS    . DILATION AND EVACUATION N/A 09/20/2017   Procedure: SUCTION DILATATION AND EVACUATION;  Surgeon: [place] Bing, MD;  Location: [facility];  Service: Gynecology;  Laterality: N/A;  . ESOPHAGOGASTRODUODENOSCOPY N/A 10/17/2012   Procedure: ESOPHAGOGASTRODUODENOSCOPY (EGD);  Surgeon: Jon Gills, MD;  Location: [facility] OR;  Service: Gastroenterology;  Laterality: N/A;  . THERAPEUTIC ABORTION  09/16/2017   Clinic  . WISDOM TOOTH EXTRACTION      Family History  Problem Relation Age of Onset  . Cholelithiasis Mother   . Hyperlipidemia Mother   . Hypertension Mother   .  Mental illness Mother   . Cholelithiasis Maternal Uncle   . Hyperlipidemia Maternal Uncle   . Hypertension Maternal Uncle   . Cholelithiasis Maternal Grandmother   . Arthritis Maternal Grandmother   . COPD Maternal Grandmother   . Depression Maternal Grandmother   . Diabetes Maternal Grandmother   . Heart disease Maternal Grandmother   . Hyperlipidemia Maternal Grandmother   . Hypertension Maternal Grandmother   . Stroke Maternal Grandmother   . Learning disabilities Paternal Grandmother   . Mental illness Paternal Grandmother   . Cancer Paternal Grandmother   . Hyperlipidemia Paternal Grandfather   . Diabetes Paternal Grandfather   . Depression Father   . Hypertension Father   . Ulcers Neg Hx   . Alcohol abuse Neg Hx   . Asthma Neg Hx   . Birth defects Neg Hx   . Drug abuse Neg Hx   . Early death Neg Hx   . Hearing loss Neg Hx   . Kidney disease Neg Hx   . Mental retardation Neg Hx   . Miscarriages / Stillbirths Neg Hx   .  Vision loss Neg Hx     Social History   Tobacco Use  . Smoking status: Never Smoker  . Smokeless tobacco: Never Used  Substance Use Topics  . Alcohol use: No  . Drug use: No    Allergies:  Allergies  Allergen Reactions  . Chlorhexidine Itching and Rash    Severe itching, redness.   . Tramadol Nausea And Vomiting    Medications Prior to Admission  Medication Sig Dispense Refill Last Dose  . acetaminophen (TYLENOL) 500 MG tablet Take 500 mg by mouth every 6 (six) hours as needed.   Not Taking  . cephALEXin (KEFLEX) 500 MG capsule Take 1 capsule (500 mg total) by mouth 4 (four) times daily. 28 capsule 0 Taking  . cephALEXin (KEFLEX) 500 MG capsule Take 1 capsule (500 mg total) by mouth at bedtime. 30 capsule 5 Taking  . cyclobenzaprine (FLEXERIL) 10 MG tablet Take 1 tablet (10 mg total) by mouth 2 (two) times daily as needed for muscle spasms. 20 tablet 1 Taking  . Doxylamine-Pyridoxine (DICLEGIS) 10-10 MG TBEC Take 1 tablet with breakfast and  lunch.  Take 2 tablets at bedtime. 100 tablet 4 Taking  . Elastic Bandages & Supports (COMFORT FIT MATERNITY SUPP LG) MISC 1 application by Does not apply route daily. 1 each 0 Taking  . famotidine (PEPCID) 20 MG tablet Take 1 tablet (20 mg total) by mouth 2 (two) times daily. 60 tablet 0   . ondansetron (ZOFRAN ODT) 4 MG disintegrating tablet Take 1 tablet (4 mg total) by mouth every 8 (eight) hours as needed for nausea or vomiting. 20 tablet 0 Taking  . Prenatal Vit-Fe Phos-FA-Omega (VITAFOL GUMMIES) 3.33-0.333-34.8 MG CHEW Chew 3 tablets by mouth at bedtime. 90 tablet 12 Taking    Review of Systems  Respiratory: Negative.   Cardiovascular: Negative.   Gastrointestinal: Positive for abdominal pain. Negative for constipation, diarrhea, nausea and vomiting.  Genitourinary: Negative.   Musculoskeletal: Negative.   Neurological: Positive for headaches. Negative for dizziness, weakness and light-headedness.   Physical Exam   Blood pressure 111/64, pulse (!) 106, temperature 98.1 F (36.7 C), temperature source Oral, resp. rate 18, last menstrual period 10/19/2017, SpO2 100 %.  Physical Exam  Nursing note and vitals reviewed. Constitutional: She is oriented to person, place, and time. She appears well-developed and well-nourished. She appears distressed.  Cardiovascular: Normal rate, regular rhythm and normal heart sounds.  Respiratory: Effort normal and breath sounds normal. No respiratory distress. She has no wheezes.  GI: Soft. There is no tenderness.  Gravid appropriate for gestational age, moderate contractions palpated   Musculoskeletal: She exhibits no edema.  Neurological: She is alert and oriented to person, place, and time. She displays normal reflexes. She exhibits normal muscle tone.    MAU Course  Procedures  MDM On arrival to MAU, BP 90s/50s, orders placed for IV LR bolus, CBC, CMP, PCR and Type and screen  BPP 6/8, sent over from MFM for NST due to occasional fetal  bradycardia  Orders Placed This Encounter  Procedures  . [place] MFM FETAL BPP WO NON STRESS  . Urinalysis, Routine w reflex microscopic  . Protein / creatinine ratio, urine  . CBC with Differential/Platelet  . Comprehensive metabolic panel  . Type and screen [facility] [place] [facility]  . Insert peripheral IV   [place] report reviewed:  [place] Mfm Fetal Bpp Wo Non Stress  Result Date: 07/15/2018 ----------------------------------------------------------------------  OBSTETRICS REPORT                       (  Signed Final 07/15/2018 05:05 pm) ---------------------------------------------------------------------- Patient Info  ID #:       [id number]                          D.O.B.:  [birth year] (21 yrs)  Name:       Traci Castro                   Visit Date: 07/15/2018 04:14 pm ---------------------------------------------------------------------- Performed By  Performed By:     Eden Lathe BS      Ref. Address:     [address]                    [facility]                                                             [address]                                                             [address]                                                             [place]                                                             [address]  Attending:        Noralee Space MD        Location:         [facility]  Referred By:      Center for                    [facility]                    [facility] ---------------------------------------------------------------------- Orders   #  Description                          Code         Ordered By   1  [place] MFM OB FOLLOW UP                  [id number]     Adleigh Mcmasters   2  [place] MFM FETAL BPP WO NON              94854.62     Marizol Borror      STRESS  ----------------------------------------------------------------------   #  Order #                    Accession #  Episode #   1  [id number]                  [id number]                  [id number]   2  [id number]                   [id number]                  [id number]  ---------------------------------------------------------------------- Indications   Obesity complicating pregnancy, third          O99.213   trimester   Anemia during pregnancy in third trimester     O99.013   Uterine size-date discrepancy, third trimester O26.843   (S>D)   Abnormality fetal heart rate or rhythm, 3rd    O36.833   trimester   [redacted] weeks gestation of pregnancy                Z3A.35  ---------------------------------------------------------------------- Vital Signs                                                 Height:        5'3" ---------------------------------------------------------------------- Fetal Evaluation  Num Of Fetuses:         1  Fetal Heart Rate(bpm):  98  Cardiac Activity:       Bradycardia  Presentation:           Cephalic  Placenta:               Posterior  P. Cord Insertion:      Visualized  Amniotic Fluid  AFI FV:      Within normal limits  AFI Sum(cm)     %Tile       Largest Pocket(cm)  19.81           74          7.26  RUQ(cm)       RLQ(cm)       LUQ(cm)        LLQ(cm)  4.72          5.95          1.88           7.26 ---------------------------------------------------------------------- Biophysical Evaluation  Amniotic F.V:   Within normal limits       F. Tone:        Observed  F. Movement:    Observed                   Score:          6/8  F. Breathing:   Not Observed ---------------------------------------------------------------------- Biometry  BPD:      91.8  mm     G. Age:  37w 2d         94  %    CI:        76.71   %    70 - 86                                                          FL/HC:      18.2   %  20.1 - 22.3  HC:       332   mm     G. Age:  37w 6d         79  %    HC/AC:      1.03        0.93 - 1.11  AC:      322.9  mm     G. Age:  36w 1d         81  %    FL/BPD:     65.9   %    71 - 87  FL:       60.5  mm     G. Age:  31w 3d        < 3  %    FL/AC:      18.7   %    20 - 24  HUM:      59.7  mm     G. Age:  34w  5d         51  %  Est. FW:    2634  gm    5 lb 13 oz      62  % ---------------------------------------------------------------------- OB History  Gravidity:    3         Term:   1  TOP:          1        Living:  1 ---------------------------------------------------------------------- Gestational Age  U/S Today:     35w 5d                                        EDD:   08/14/18  Best:          35w 2d     Det. By:  Previous Ultrasound      EDD:   08/17/18                                      (02/10/18) ---------------------------------------------------------------------- Anatomy  Cranium:               Appears normal         Aortic Arch:            Previously seen  Cavum:                 Appears normal         Ductal Arch:            Previously seen  Ventricles:            Appears normal         Diaphragm:              Appears normal  Choroid Plexus:        Appears normal         Stomach:                Appears normal, left  sided  Cerebellum:            Previously seen        Abdomen:                Appears normal  Posterior Fossa:       Previously seen        Abdominal Wall:         Previously seen  Nuchal Fold:           Previously seen        Cord Vessels:           Previously seen  Face:                  Appears normal         Kidneys:                Appear normal                         (orbits and profile)  Lips:                  Appears normal         Bladder:                Appears normal  Thoracic:              Appears normal         Spine:                  Previously seen  Heart:                 Appears normal         Upper Extremities:      Previously seen                         (4CH, axis, and situs  RVOT:                  Previously seen        Lower Extremities:      Previously seen  LVOT:                  Appears normal  Other:  Heels and 5th digit previously visualized. Nasal bone visualized.  ---------------------------------------------------------------------- Cervix Uterus Adnexa  Cervix  Not visualized (advanced GA >24wks) ---------------------------------------------------------------------- Impression  Patient returned for fetal growth assessment.  Amniotic fluid is normal and good fetal activity is seen. Fetal  growth is appropriate for gestational age. Fetal bradycardia  (98 bpm) was seen with good recovery. Antenatal testing was  performed because of bradycardia. Fetal breathing  movements did not meet the criteria. BPP 6/8.  Patient was sent over to MAU for NST. ---------------------------------------------------------------------- Recommendations  -An appointment was made for her to return next week for  BPP and NST. ----------------------------------------------------------------------                  Noralee Space, MD Electronically Signed Final Report   07/15/2018 05:05 pm ----------------------------------------------------------------------  [place] Mfm Ob Follow Up  Result Date: 07/15/2018 ----------------------------------------------------------------------  OBSTETRICS REPORT                       (Signed Final 07/15/2018 05:05 pm) ---------------------------------------------------------------------- Patient Info  ID #:       [id number]  D.O.B.:  1996/06/28 (21 yrs)  Name:       Traci Castro                   Visit Date: 07/15/2018 04:14 pm ---------------------------------------------------------------------- Performed By  Performed By:     Eden Lathe BS      Ref. Address:     [address]                    [facility]                                                             [address]                                                             [address]                                                             [place]                                                             [address]  Attending:        Noralee Space MD        Location:         [facility]  Referred By:      Center for                    [facility]                    [facility] ---------------------------------------------------------------------- Orders   #  Description                          Code         Ordered By   1  [place] MFM OB FOLLOW UP                  [phone]     Steward Drone   2  [place] MFM FETAL BPP WO NON              81191.47     Tawna Alwin      STRESS  ----------------------------------------------------------------------   #  Order #                    Accession #                 Episode #   1  [id number]                  [id number]  [id number]   2  [id number]                  [id number]                  [id number]  ---------------------------------------------------------------------- Indications   Obesity complicating pregnancy, third          O99.213   trimester   Anemia during pregnancy in third trimester     O99.013   Uterine size-date discrepancy, third trimester O26.843   (S>D)   Abnormality fetal heart rate or rhythm, 3rd    O36.833   trimester   [redacted] weeks gestation of pregnancy                Z3A.35  ---------------------------------------------------------------------- Vital Signs                                                 Height:        5'3" ---------------------------------------------------------------------- Fetal Evaluation  Num Of Fetuses:         1  Fetal Heart Rate(bpm):  98  Cardiac Activity:       Bradycardia  Presentation:           Cephalic  Placenta:               Posterior  P. Cord Insertion:      Visualized  Amniotic Fluid  AFI FV:      Within normal limits  AFI Sum(cm)     %Tile       Largest Pocket(cm)  19.81           74          7.26  RUQ(cm)       RLQ(cm)       LUQ(cm)        LLQ(cm)  4.72          5.95          1.88           7.26 ---------------------------------------------------------------------- Biophysical Evaluation  Amniotic F.V:   Within normal limits       F. Tone:        Observed  F. Movement:    Observed                    Score:          6/8  F. Breathing:   Not Observed ---------------------------------------------------------------------- Biometry  BPD:      91.8  mm     G. Age:  37w 2d         94  %    CI:        76.71   %    70 - 86                                                          FL/HC:      18.2   %    20.1 - 22.3  HC:       332   mm     G. Age:  37w 6d         79  %  HC/AC:      1.03        0.93 - 1.11  AC:      322.9  mm     G. Age:  36w 1d         81  %    FL/BPD:     65.9   %    71 - 87  FL:       60.5  mm     G. Age:  31w 3d        < 3  %    FL/AC:      18.7   %    20 - 24  HUM:      59.7  mm     G. Age:  34w 5d         51  %  Est. FW:    2634  gm    5 lb 13 oz      62  % ---------------------------------------------------------------------- OB History  Gravidity:    3         Term:   1  TOP:          1        Living:  1 ---------------------------------------------------------------------- Gestational Age  U/S Today:     35w 5d                                        EDD:   08/14/18  Best:          35w 2d     Det. By:  Previous Ultrasound      EDD:   08/17/18                                      (02/10/18) ---------------------------------------------------------------------- Anatomy  Cranium:               Appears normal         Aortic Arch:            Previously seen  Cavum:                 Appears normal         Ductal Arch:            Previously seen  Ventricles:            Appears normal         Diaphragm:              Appears normal  Choroid Plexus:        Appears normal         Stomach:                Appears normal, left                                                                        sided  Cerebellum:            Previously seen        Abdomen:  Appears normal  Posterior Fossa:       Previously seen        Abdominal Wall:         Previously seen  Nuchal Fold:           Previously seen        Cord Vessels:           Previously seen  Face:                  Appears normal          Kidneys:                Appear normal                         (orbits and profile)  Lips:                  Appears normal         Bladder:                Appears normal  Thoracic:              Appears normal         Spine:                  Previously seen  Heart:                 Appears normal         Upper Extremities:      Previously seen                         (4CH, axis, and situs  RVOT:                  Previously seen        Lower Extremities:      Previously seen  LVOT:                  Appears normal  Other:  Heels and 5th digit previously visualized. Nasal bone visualized. ---------------------------------------------------------------------- Cervix Uterus Adnexa  Cervix  Not visualized (advanced GA >24wks) ---------------------------------------------------------------------- Impression  Patient returned for fetal growth assessment.  Amniotic fluid is normal and good fetal activity is seen. Fetal  growth is appropriate for gestational age. Fetal bradycardia  (98 bpm) was seen with good recovery. Antenatal testing was  performed because of bradycardia. Fetal breathing  movements did not meet the criteria. BPP 6/8.  Patient was sent over to MAU for NST. ---------------------------------------------------------------------- Recommendations  -An appointment was made for her to return next week for  BPP and NST. ----------------------------------------------------------------------                  Noralee Space, MD Electronically Signed Final Report   07/15/2018 05:05 pm ----------------------------------------------------------------------  Results for orders placed or performed during the hospital encounter of 07/15/18 (from the past 24 hour(s))  CBC with Differential/Platelet     Status: Abnormal   Collection Time: 07/15/18  5:11 PM  Result Value Ref Range   WBC 12.3 (H) 4.0 - 10.5 K/uL   RBC 4.03 3.87 - 5.11 MIL/uL   Hemoglobin 9.2 (L) 12.0 - 15.0 g/dL   HCT 29.5 (L) 62.1 - 30.8 %   MCV 75.9  (L) 80.0 - 100.0 fL   MCH 22.8 (L) 26.0 - 34.0 pg   MCHC 30.1 30.0 - 36.0 g/dL  RDW 15.4 11.5 - 15.5 %   Platelets 331 150 - 400 K/uL   nRBC 0.0 0.0 - 0.2 %   Neutrophils Relative % 67 %   Neutro Abs 8.2 (H) 1.7 - 7.7 K/uL   Lymphocytes Relative 28 %   Lymphs Abs 3.4 0.7 - 4.0 K/uL   Monocytes Relative 5 %   Monocytes Absolute 0.6 0.1 - 1.0 K/uL   Eosinophils Relative 0 %   Eosinophils Absolute 0.0 0.0 - 0.5 K/uL   Basophils Relative 0 %   Basophils Absolute 0.0 0.0 - 0.1 K/uL  Comprehensive metabolic panel     Status: Abnormal   Collection Time: 07/15/18  5:11 PM  Result Value Ref Range   Sodium 136 135 - 145 mmol/L   Potassium 3.9 3.5 - 5.1 mmol/L   Chloride 104 98 - 111 mmol/L   CO2 21 (L) 22 - 32 mmol/L   Glucose, Bld 72 70 - 99 mg/dL   BUN 6 6 - 20 mg/dL   Creatinine, Ser 9.14 0.44 - 1.00 mg/dL   Calcium 8.8 (L) 8.9 - 10.3 mg/dL   Total Protein 6.7 6.5 - 8.1 g/dL   Albumin 3.0 (L) 3.5 - 5.0 g/dL   AST 14 (L) 15 - 41 U/L   ALT 14 0 - 44 U/L   Alkaline Phosphatase 162 (H) 38 - 126 U/L   Total Bilirubin 0.5 0.3 - 1.2 mg/dL   GFR calc non Af Amer >60 >60 mL/min   GFR calc Af Amer >60 >60 mL/min   Anion gap 11 5 - 15  Type and screen [facility] [place]     Status: None   Collection Time: 07/15/18  5:11 PM  Result Value Ref Range   ABO/RH(D) A POS    Antibody Screen NEG    Sample Expiration      07/18/2018 Performed at [facility], [address], [place], [place] [address]   Urinalysis, Routine w reflex microscopic     Status: Abnormal   Collection Time: 07/15/18  5:49 PM  Result Value Ref Range   Color, Urine AMBER (A) YELLOW   APPearance CLOUDY (A) CLEAR   Specific Gravity, Urine 1.024 1.005 - 1.030   pH 7.0 5.0 - 8.0   Glucose, UA NEGATIVE NEGATIVE mg/dL   Hgb urine dipstick NEGATIVE NEGATIVE   Bilirubin Urine NEGATIVE NEGATIVE   Ketones, ur 80 (A) NEGATIVE mg/dL   Protein, ur 621 (A) NEGATIVE mg/dL   Nitrite NEGATIVE NEGATIVE    Leukocytes, UA TRACE (A) NEGATIVE   RBC / HPF 0-5 0 - 5 RBC/hpf   WBC, UA 6-10 0 - 5 WBC/hpf   Bacteria, UA RARE (A) NONE SEEN   Squamous Epithelial / LPF 11-20 0 - 5   Mucus PRESENT   Protein / creatinine ratio, urine     Status: None   Collection Time: 07/15/18  5:49 PM  Result Value Ref Range   Creatinine, Urine 320.00 mg/dL   Total Protein, Urine 47 mg/dL   Protein Creatinine Ratio 0.15 0.00 - 0.15 mg/mg[Cre]  Fern Test     Status: None   Collection Time: 07/15/18  6:03 PM  Result Value Ref Range   POCT Fern Test Negative = intact amniotic membranes    Dr Adrian Blackwater to bedside in MAU to assess FHR.  BP and FHR increased to baseline with IVF. Cervical examination and scalp stimulation performed.  Dilation: 2.5 Effacement (%): Thick Cervical Position: Posterior Station: -3 Presentation: Vertex Exam by:: V.  Aundria Rud, CNM  FHR: 120/ moderate/ +accels/ one variable deceleration  Toco: 2-3 minutes   No cervical change upon reassessment. Patient reports HA is relieved with medication treatment of Tylenol. Consult with Dr Adrian Blackwater with plan of care- recommends admission to antenatal for observation.   Discussed plan of care with patient. Orders placed by Dr Adrian Blackwater and care taken over by Dr Adrian Blackwater.   Assessment and Plan   1. BPP 6/8 with occasional bradycardia  2. Preterm uterine contractions   Admit to antenatal  Orders placed  Care taken over by Dr Adrian Blackwater   Sharyon Cable CNM 07/15/2018, 7:36 PM

## 2018-07-15 NOTE — MAU Note (Signed)
Bedside U/S by C. Druscilla BrownieNeill CNM

## 2018-07-15 NOTE — MAU Note (Signed)
Report called to Ninfa MeekerJudy Lowe, RN in MAU per [facility]  Pt ambulated with FOB to MAU for further evaluation and monitoring after U/S in [facility].

## 2018-07-16 ENCOUNTER — Inpatient Hospital Stay (HOSPITAL_BASED_OUTPATIENT_CLINIC_OR_DEPARTMENT_OTHER): Payer: 59

## 2018-07-16 ENCOUNTER — Ambulatory Visit: Payer: 59 | Attending: Advanced Practice Midwife | Admitting: Physical Therapy

## 2018-07-16 DIAGNOSIS — O368390 Maternal care for abnormalities of the fetal heart rate or rhythm, unspecified trimester, not applicable or unspecified: Secondary | ICD-10-CM

## 2018-07-16 DIAGNOSIS — O99213 Obesity complicating pregnancy, third trimester: Secondary | ICD-10-CM

## 2018-07-16 DIAGNOSIS — O99013 Anemia complicating pregnancy, third trimester: Secondary | ICD-10-CM

## 2018-07-16 DIAGNOSIS — O26843 Uterine size-date discrepancy, third trimester: Secondary | ICD-10-CM

## 2018-07-16 DIAGNOSIS — Z3A35 35 weeks gestation of pregnancy: Secondary | ICD-10-CM

## 2018-07-16 DIAGNOSIS — O368330 Maternal care for abnormalities of the fetal heart rate or rhythm, third trimester, not applicable or unspecified: Secondary | ICD-10-CM

## 2018-07-16 DIAGNOSIS — O99210 Obesity complicating pregnancy, unspecified trimester: Secondary | ICD-10-CM | POA: Diagnosis present

## 2018-07-16 DIAGNOSIS — I959 Hypotension, unspecified: Secondary | ICD-10-CM | POA: Diagnosis present

## 2018-07-16 LAB — CBC WITH DIFFERENTIAL/PLATELET
BASOS PCT: 0 %
Basophils Absolute: 0 10*3/uL (ref 0.0–0.1)
EOS ABS: 0 10*3/uL (ref 0.0–0.5)
EOS PCT: 0 %
HCT: 27.2 % — ABNORMAL LOW (ref 36.0–46.0)
Hemoglobin: 8.3 g/dL — ABNORMAL LOW (ref 12.0–15.0)
LYMPHS ABS: 2.5 10*3/uL (ref 0.7–4.0)
Lymphocytes Relative: 21 %
MCH: 22.9 pg — AB (ref 26.0–34.0)
MCHC: 30.5 g/dL (ref 30.0–36.0)
MCV: 75.1 fL — ABNORMAL LOW (ref 80.0–100.0)
Monocytes Absolute: 0.6 10*3/uL (ref 0.1–1.0)
Monocytes Relative: 5 %
NRBC: 0 % (ref 0.0–0.2)
Neutro Abs: 9 10*3/uL — ABNORMAL HIGH (ref 1.7–7.7)
Neutrophils Relative %: 74 %
PLATELETS: 310 10*3/uL (ref 150–400)
RBC: 3.62 MIL/uL — AB (ref 3.87–5.11)
RDW: 15.4 % (ref 11.5–15.5)
WBC: 12.1 10*3/uL — AB (ref 4.0–10.5)

## 2018-07-16 LAB — RAPID URINE DRUG SCREEN, HOSP PERFORMED
Amphetamines: NOT DETECTED
Barbiturates: NOT DETECTED
Benzodiazepines: NOT DETECTED
Cocaine: NOT DETECTED
Opiates: NOT DETECTED
Tetrahydrocannabinol: NOT DETECTED

## 2018-07-16 LAB — CBC
HCT: 29.2 % — ABNORMAL LOW (ref 36.0–46.0)
Hemoglobin: 9 g/dL — ABNORMAL LOW (ref 12.0–15.0)
MCH: 23.6 pg — AB (ref 26.0–34.0)
MCHC: 30.8 g/dL (ref 30.0–36.0)
MCV: 76.4 fL — AB (ref 80.0–100.0)
PLATELETS: 293 10*3/uL (ref 150–400)
RBC: 3.82 MIL/uL — AB (ref 3.87–5.11)
RDW: 15.9 % — ABNORMAL HIGH (ref 11.5–15.5)
WBC: 12.4 10*3/uL — ABNORMAL HIGH (ref 4.0–10.5)
nRBC: 0 % (ref 0.0–0.2)

## 2018-07-16 LAB — COMPREHENSIVE METABOLIC PANEL
ALK PHOS: 137 U/L — AB (ref 38–126)
ALT: 12 U/L (ref 0–44)
AST: 13 U/L — AB (ref 15–41)
Albumin: 2.7 g/dL — ABNORMAL LOW (ref 3.5–5.0)
Anion gap: 9 (ref 5–15)
CALCIUM: 8.3 mg/dL — AB (ref 8.9–10.3)
CO2: 19 mmol/L — AB (ref 22–32)
Chloride: 104 mmol/L (ref 98–111)
Creatinine, Ser: 0.53 mg/dL (ref 0.44–1.00)
GFR calc Af Amer: >60 mL/min (ref >60)
GFR calc non Af Amer: >60 mL/min (ref >60)
GLUCOSE: 95 mg/dL (ref 70–99)
Potassium: 3.9 mmol/L (ref 3.5–5.1)
SODIUM: 132 mmol/L — AB (ref 135–145)
Total Bilirubin: 0.4 mg/dL (ref 0.3–1.2)
Total Protein: 6.3 g/dL — ABNORMAL LOW (ref 6.5–8.1)

## 2018-07-16 LAB — PREPARE RBC (CROSSMATCH)

## 2018-07-16 LAB — TSH: TSH: 0.864 u[IU]/mL (ref 0.350–4.500)

## 2018-07-16 LAB — BRAIN NATRIURETIC PEPTIDE: B NATRIURETIC PEPTIDE 5: 28 pg/mL (ref 0.0–100.0)

## 2018-07-16 MED ORDER — LACTATED RINGERS IV BOLUS
500.0000 mL | Freq: Once | INTRAVENOUS | Status: AC
  Administered 2018-07-16: 500 mL via INTRAVENOUS

## 2018-07-16 MED ORDER — SODIUM CHLORIDE 0.9% IV SOLUTION
Freq: Once | INTRAVENOUS | Status: AC
  Administered 2018-07-16: 11:00:00 via INTRAVENOUS

## 2018-07-16 MED ORDER — ACETAMINOPHEN 500 MG PO TABS
1000.0000 mg | ORAL_TABLET | Freq: Once | ORAL | Status: AC
  Administered 2018-07-16: 1000 mg via ORAL
  Filled 2018-07-16: qty 2

## 2018-07-16 MED ORDER — LACTATED RINGERS IV BOLUS
1000.0000 mL | Freq: Once | INTRAVENOUS | Status: AC
  Administered 2018-07-16: 1000 mL via INTRAVENOUS

## 2018-07-16 MED ORDER — DIPHENHYDRAMINE HCL 25 MG PO CAPS
25.0000 mg | ORAL_CAPSULE | Freq: Once | ORAL | Status: AC
  Administered 2018-07-16: 25 mg via ORAL
  Filled 2018-07-16: qty 1

## 2018-07-16 MED ORDER — OXYCODONE-ACETAMINOPHEN 5-325 MG PO TABS
1.0000 | ORAL_TABLET | Freq: Once | ORAL | Status: AC
  Administered 2018-07-16: 1 via ORAL
  Filled 2018-07-16: qty 1

## 2018-07-16 NOTE — H&P (Signed)
History     CSN: [id number]  Arrival date and time: 07/15/18 1621   First Provider Initiated Contact with Patient 07/15/18 1652      Chief Complaint  Patient presents with  . Low FHR   Traci Castro is a 22 y.o. G3P1 at [redacted]w[redacted]d who was sent over from MFM for bradycardia. She reports FHR was 96-110's in [place] and BPP was 6/8 (off for breathing). She has received prenatal care at [facility]. She reports occasional contractions and good fetal movement. Denies vaginal bleeding, vaginal discharge, urinary symptoms or N/V. Reports having a HA earlier today, HA has continued to linger since arrival to hospital for [place]. Rates 3/10- has not taken any medication for HA. BP elevated in office one time yesterday, none prior and denies BP being elevated today.    OB History    Gravida  3   Para  1   Term  1   Preterm      AB  1   Living  1     SAB      TAB  1   Ectopic      Multiple  0   Live Births  1           Past Medical History:  Diagnosis Date  . Anemia   . Anxiety    no meds  . Bipolar disorder (HCC)   . Depression    no meds  . Gastritis    2 bouts  . H/O seasonal allergies   . Migraine    migraines  . Psoriasis   . SVD (spontaneous vaginal delivery)    x 1  . Vision abnormalities    wears glasses    Past Surgical History:  Procedure Laterality Date  . DILATION AND CURETTAGE OF UTERUS    . DILATION AND EVACUATION N/A 09/20/2017   Procedure: SUCTION DILATATION AND EVACUATION;  Surgeon: Murray Bing, MD;  Location: [facility];  Service: Gynecology;  Laterality: N/A;  . ESOPHAGOGASTRODUODENOSCOPY N/A 10/17/2012   Procedure: ESOPHAGOGASTRODUODENOSCOPY (EGD);  Surgeon: Jon Gills, MD;  Location: [facility] OR;  Service: Gastroenterology;  Laterality: N/A;  . THERAPEUTIC ABORTION  09/16/2017   Clinic  . WISDOM TOOTH EXTRACTION      Family History  Problem Relation Age of Onset  . Cholelithiasis Mother   . Hyperlipidemia Mother   . Hypertension Mother   .  Mental illness Mother   . Cholelithiasis Maternal Uncle   . Hyperlipidemia Maternal Uncle   . Hypertension Maternal Uncle   . Cholelithiasis Maternal Grandmother   . Arthritis Maternal Grandmother   . COPD Maternal Grandmother   . Depression Maternal Grandmother   . Diabetes Maternal Grandmother   . Heart disease Maternal Grandmother   . Hyperlipidemia Maternal Grandmother   . Hypertension Maternal Grandmother   . Stroke Maternal Grandmother   . Learning disabilities Paternal Grandmother   . Mental illness Paternal Grandmother   . Cancer Paternal Grandmother   . Hyperlipidemia Paternal Grandfather   . Diabetes Paternal Grandfather   . Depression Father   . Hypertension Father   . Ulcers Neg Hx   . Alcohol abuse Neg Hx   . Asthma Neg Hx   . Birth defects Neg Hx   . Drug abuse Neg Hx   . Early death Neg Hx   . Hearing loss Neg Hx   . Kidney disease Neg Hx   . Mental retardation Neg Hx   . Miscarriages / Stillbirths Neg Hx   .  Vision loss Neg Hx     Social History   Tobacco Use  . Smoking status: Never Smoker  . Smokeless tobacco: Never Used  Substance Use Topics  . Alcohol use: No  . Drug use: No    Allergies:  Allergies  Allergen Reactions  . Chlorhexidine Itching and Rash    Severe itching, redness.   . Tramadol Nausea And Vomiting    Medications Prior to Admission  Medication Sig Dispense Refill Last Dose  . acetaminophen (TYLENOL) 500 MG tablet Take 500 mg by mouth every 6 (six) hours as needed.   Not Taking  . cephALEXin (KEFLEX) 500 MG capsule Take 1 capsule (500 mg total) by mouth 4 (four) times daily. 28 capsule 0 Taking  . cephALEXin (KEFLEX) 500 MG capsule Take 1 capsule (500 mg total) by mouth at bedtime. 30 capsule 5 Taking  . cyclobenzaprine (FLEXERIL) 10 MG tablet Take 1 tablet (10 mg total) by mouth 2 (two) times daily as needed for muscle spasms. 20 tablet 1 Taking  . Doxylamine-Pyridoxine (DICLEGIS) 10-10 MG TBEC Take 1 tablet with breakfast and  lunch.  Take 2 tablets at bedtime. 100 tablet 4 Taking  . Elastic Bandages & Supports (COMFORT FIT MATERNITY SUPP LG) MISC 1 application by Does not apply route daily. 1 each 0 Taking  . famotidine (PEPCID) 20 MG tablet Take 1 tablet (20 mg total) by mouth 2 (two) times daily. 60 tablet 0   . ondansetron (ZOFRAN ODT) 4 MG disintegrating tablet Take 1 tablet (4 mg total) by mouth every 8 (eight) hours as needed for nausea or vomiting. 20 tablet 0 Taking  . Prenatal Vit-Fe Phos-FA-Omega (VITAFOL GUMMIES) 3.33-0.333-34.8 MG CHEW Chew 3 tablets by mouth at bedtime. 90 tablet 12 Taking    Review of Systems  Respiratory: Negative.   Cardiovascular: Negative.   Gastrointestinal: Positive for abdominal pain. Negative for constipation, diarrhea, nausea and vomiting.  Genitourinary: Negative.   Musculoskeletal: Negative.   Neurological: Positive for headaches. Negative for dizziness, weakness and light-headedness.   Physical Exam   Blood pressure 111/64, pulse (!) 106, temperature 98.1 F (36.7 C), temperature source Oral, resp. rate 18, last menstrual period 10/19/2017, SpO2 100 %.  Physical Exam  Nursing note and vitals reviewed. Constitutional: She is oriented to person, place, and time. She appears well-developed and well-nourished. She appears distressed.  Cardiovascular: Normal rate, regular rhythm and normal heart sounds.  Respiratory: Effort normal and breath sounds normal. No respiratory distress. She has no wheezes.  GI: Soft. There is no tenderness.  Gravid appropriate for gestational age, moderate contractions palpated   Musculoskeletal: She exhibits no edema.  Neurological: She is alert and oriented to person, place, and time. She displays normal reflexes. She exhibits normal muscle tone.    MAU Course  Procedures  MDM On arrival to MAU, BP 90s/50s, orders placed for IV LR bolus, CBC, CMP, PCR and Type and screen  BPP 6/8, sent over from MFM for NST due to occasional fetal  bradycardia  Orders Placed This Encounter  Procedures  . [place] MFM FETAL BPP WO NON STRESS  . Urinalysis, Routine w reflex microscopic  . Protein / creatinine ratio, urine  . CBC with Differential/Platelet  . Comprehensive metabolic panel  . Type and screen [facility] [place]  . Insert peripheral IV   [place] report reviewed:  [place] Mfm Fetal Bpp Wo Non Stress  Result Date: 07/15/2018 ----------------------------------------------------------------------  OBSTETRICS REPORT                       (  Signed Final 07/15/2018 05:05 pm) ---------------------------------------------------------------------- Patient Info  ID #:       [id number]                          D.O.B.:  1996/03/13 (21 yrs)  Name:       Traci Castro                   Visit Date: 07/15/2018 04:14 pm ---------------------------------------------------------------------- Performed By  Performed By:     Eden Lathe BS      Ref. Address:     [address]                    RD[facility] RVT                                                             [address]                                                             [address]                                                             [place]                                                             [address]  Attending:        Noralee Space MD        Location:         [facility]  Referred By:      Center for                    [facility]                    [facility] - [facility] ---------------------------------------------------------------------- Orders   #  Description                          Code         Ordered By   1  [place] MFM OB FOLLOW UP                  [id number]     VERONICA ROGERS   2  [place] MFM FETAL BPP WO NON              19147.82     VERONICA ROGERS      STRESS  ----------------------------------------------------------------------   #  Order #                    Accession #  Episode #   1  [id number]                  [id number]                  [id number]   2  [id number]                   [id number]                  [id number]  ---------------------------------------------------------------------- Indications   Obesity complicating pregnancy, third          O99.213   trimester   Anemia during pregnancy in third trimester     O99.013   Uterine size-date discrepancy, third trimester O26.843   (S>D)   Abnormality fetal heart rate or rhythm, 3rd    O36.833   trimester   [redacted] weeks gestation of pregnancy                Z3A.35  ---------------------------------------------------------------------- Vital Signs                                                 Height:        5'3" ---------------------------------------------------------------------- Fetal Evaluation  Num Of Fetuses:         1  Fetal Heart Rate(bpm):  98  Cardiac Activity:       Bradycardia  Presentation:           Cephalic  Placenta:               Posterior  P. Cord Insertion:      Visualized  Amniotic Fluid  AFI FV:      Within normal limits  AFI Sum(cm)     %Tile       Largest Pocket(cm)  19.81           74          7.26  RUQ(cm)       RLQ(cm)       LUQ(cm)        LLQ(cm)  4.72          5.95          1.88           7.26 ---------------------------------------------------------------------- Biophysical Evaluation  Amniotic F.V:   Within normal limits       F. Tone:        Observed  F. Movement:    Observed                   Score:          6/8  F. Breathing:   Not Observed ---------------------------------------------------------------------- Biometry  BPD:      91.8  mm     G. Age:  37w 2d         94  %    CI:        76.71   %    70 - 86                                                          FL/HC:      18.2   %  20.1 - 22.3  HC:       332   mm     G. Age:  37w 6d         79  %    HC/AC:      1.03        0.93 - 1.11  AC:      322.9  mm     G. Age:  36w 1d         81  %    FL/BPD:     65.9   %    71 - 87  FL:       60.5  mm     G. Age:  31w 3d        < 3  %    FL/AC:      18.7   %    20 - 24  HUM:      59.7  mm     G. Age:  34w  5d         51  %  Est. FW:    2634  gm    5 lb 13 oz      62  % ---------------------------------------------------------------------- OB History  Gravidity:    3         Term:   1  TOP:          1        Living:  1 ---------------------------------------------------------------------- Gestational Age  U/S Today:     35w 5d                                        EDD:   08/14/18  Best:          35w 2d     Det. By:  Previous Ultrasound      EDD:   08/17/18                                      (02/10/18) ---------------------------------------------------------------------- Anatomy  Cranium:               Appears normal         Aortic Arch:            Previously seen  Cavum:                 Appears normal         Ductal Arch:            Previously seen  Ventricles:            Appears normal         Diaphragm:              Appears normal  Choroid Plexus:        Appears normal         Stomach:                Appears normal, left  sided  Cerebellum:            Previously seen        Abdomen:                Appears normal  Posterior Fossa:       Previously seen        Abdominal Wall:         Previously seen  Nuchal Fold:           Previously seen        Cord Vessels:           Previously seen  Face:                  Appears normal         Kidneys:                Appear normal                         (orbits and profile)  Lips:                  Appears normal         Bladder:                Appears normal  Thoracic:              Appears normal         Spine:                  Previously seen  Heart:                 Appears normal         Upper Extremities:      Previously seen                         (4CH, axis, and situs  RVOT:                  Previously seen        Lower Extremities:      Previously seen  LVOT:                  Appears normal  Other:  Heels and 5th digit previously visualized. Nasal bone visualized.  ---------------------------------------------------------------------- Cervix Uterus Adnexa  Cervix  Not visualized (advanced GA >24wks) ---------------------------------------------------------------------- Impression  Patient returned for fetal growth assessment.  Amniotic fluid is normal and good fetal activity is seen. Fetal  growth is appropriate for gestational age. Fetal bradycardia  (98 bpm) was seen with good recovery. Antenatal testing was  performed because of bradycardia. Fetal breathing  movements did not meet the criteria. BPP 6/8.  Patient was sent over to MAU for NST. ---------------------------------------------------------------------- Recommendations  -An appointment was made for her to return next week for  BPP and NST. ----------------------------------------------------------------------                  Noralee Space, MD Electronically Signed Final Report   07/15/2018 05:05 pm ----------------------------------------------------------------------  [place] Mfm Ob Follow Up  Result Date: 07/15/2018 ----------------------------------------------------------------------  OBSTETRICS REPORT                       (Signed Final 07/15/2018 05:05 pm) ---------------------------------------------------------------------- Patient Info  ID #:       [id number]  D.O.B.:  1996-01-29 (21 yrs)  Name:       Traci Castro                   Visit Date: 07/15/2018 04:14 pm ---------------------------------------------------------------------- Performed By  Performed By:     Eden Lathe BS      Ref. Address:     [address]                    RDMS RVT                                                             [address]                                                             [address]                                                             [place]                                                             [address]  Attending:        Noralee Space MD        Location:         [facility]  Referred By:      Center for                    [facility]                    Healthcare - [facility] ---------------------------------------------------------------------- Orders   #  Description                          Code         Ordered By   1  [place] MFM OB FOLLOW UP                  [phone]     Steward Drone   2  [place] MFM FETAL BPP WO NON              84696.29     VERONICA ROGERS      STRESS  ----------------------------------------------------------------------   #  Order #                    Accession #                 Episode #   1  [id number]                  [id number]  [id number]   2  [id number]                  [id number]                  [id number]  ---------------------------------------------------------------------- Indications   Obesity complicating pregnancy, third          O99.213   trimester   Anemia during pregnancy in third trimester     O99.013   Uterine size-date discrepancy, third trimester O26.843   (S>D)   Abnormality fetal heart rate or rhythm, 3rd    O36.833   trimester   [redacted] weeks gestation of pregnancy                Z3A.35  ---------------------------------------------------------------------- Vital Signs                                                 Height:        5'3" ---------------------------------------------------------------------- Fetal Evaluation  Num Of Fetuses:         1  Fetal Heart Rate(bpm):  98  Cardiac Activity:       Bradycardia  Presentation:           Cephalic  Placenta:               Posterior  P. Cord Insertion:      Visualized  Amniotic Fluid  AFI FV:      Within normal limits  AFI Sum(cm)     %Tile       Largest Pocket(cm)  19.81           74          7.26  RUQ(cm)       RLQ(cm)       LUQ(cm)        LLQ(cm)  4.72          5.95          1.88           7.26 ---------------------------------------------------------------------- Biophysical Evaluation  Amniotic F.V:   Within normal limits       F. Tone:        Observed  F. Movement:    Observed                    Score:          6/8  F. Breathing:   Not Observed ---------------------------------------------------------------------- Biometry  BPD:      91.8  mm     G. Age:  37w 2d         94  %    CI:        76.71   %    70 - 86                                                          FL/HC:      18.2   %    20.1 - 22.3  HC:       332   mm     G. Age:  37w 6d         79  %  HC/AC:      1.03        0.93 - 1.11  AC:      322.9  mm     G. Age:  36w 1d         81  %    FL/BPD:     65.9   %    71 - 87  FL:       60.5  mm     G. Age:  31w 3d        < 3  %    FL/AC:      18.7   %    20 - 24  HUM:      59.7  mm     G. Age:  34w 5d         51  %  Est. FW:    2634  gm    5 lb 13 oz      62  % ---------------------------------------------------------------------- OB History  Gravidity:    3         Term:   1  TOP:          1        Living:  1 ---------------------------------------------------------------------- Gestational Age  U/S Today:     35w 5d                                        EDD:   08/14/18  Best:          35w 2d     Det. By:  Previous Ultrasound      EDD:   08/17/18                                      (02/10/18) ---------------------------------------------------------------------- Anatomy  Cranium:               Appears normal         Aortic Arch:            Previously seen  Cavum:                 Appears normal         Ductal Arch:            Previously seen  Ventricles:            Appears normal         Diaphragm:              Appears normal  Choroid Plexus:        Appears normal         Stomach:                Appears normal, left                                                                        sided  Cerebellum:            Previously seen        Abdomen:  Appears normal  Posterior Fossa:       Previously seen        Abdominal Wall:         Previously seen  Nuchal Fold:           Previously seen        Cord Vessels:           Previously seen  Face:                  Appears normal          Kidneys:                Appear normal                         (orbits and profile)  Lips:                  Appears normal         Bladder:                Appears normal  Thoracic:              Appears normal         Spine:                  Previously seen  Heart:                 Appears normal         Upper Extremities:      Previously seen                         (4CH, axis, and situs  RVOT:                  Previously seen        Lower Extremities:      Previously seen  LVOT:                  Appears normal  Other:  Heels and 5th digit previously visualized. Nasal bone visualized. ---------------------------------------------------------------------- Cervix Uterus Adnexa  Cervix  Not visualized (advanced GA >24wks) ---------------------------------------------------------------------- Impression  Patient returned for fetal growth assessment.  Amniotic fluid is normal and good fetal activity is seen. Fetal  growth is appropriate for gestational age. Fetal bradycardia  (98 bpm) was seen with good recovery. Antenatal testing was  performed because of bradycardia. Fetal breathing  movements did not meet the criteria. BPP 6/8.  Patient was sent over to MAU for NST. ---------------------------------------------------------------------- Recommendations  -An appointment was made for her to return next week for  BPP and NST. ----------------------------------------------------------------------                  Noralee Space, MD Electronically Signed Final Report   07/15/2018 05:05 pm ----------------------------------------------------------------------  Results for orders placed or performed during the hospital encounter of 07/15/18 (from the past 24 hour(s))  CBC with Differential/Platelet     Status: Abnormal   Collection Time: 07/15/18  5:11 PM  Result Value Ref Range   WBC 12.3 (H) 4.0 - 10.5 K/uL   RBC 4.03 3.87 - 5.11 MIL/uL   Hemoglobin 9.2 (L) 12.0 - 15.0 g/dL   HCT 16.1 (L) 09.6 - 04.5 %   MCV 75.9  (L) 80.0 - 100.0 fL   MCH 22.8 (L) 26.0 - 34.0 pg   MCHC 30.1 30.0 - 36.0 g/dL  RDW 15.4 11.5 - 15.5 %   Platelets 331 150 - 400 K/uL   nRBC 0.0 0.0 - 0.2 %   Neutrophils Relative % 67 %   Neutro Abs 8.2 (H) 1.7 - 7.7 K/uL   Lymphocytes Relative 28 %   Lymphs Abs 3.4 0.7 - 4.0 K/uL   Monocytes Relative 5 %   Monocytes Absolute 0.6 0.1 - 1.0 K/uL   Eosinophils Relative 0 %   Eosinophils Absolute 0.0 0.0 - 0.5 K/uL   Basophils Relative 0 %   Basophils Absolute 0.0 0.0 - 0.1 K/uL  Comprehensive metabolic panel     Status: Abnormal   Collection Time: 07/15/18  5:11 PM  Result Value Ref Range   Sodium 136 135 - 145 mmol/L   Potassium 3.9 3.5 - 5.1 mmol/L   Chloride 104 98 - 111 mmol/L   CO2 21 (L) 22 - 32 mmol/L   Glucose, Bld 72 70 - 99 mg/dL   BUN 6 6 - 20 mg/dL   Creatinine, Ser 1.61 0.44 - 1.00 mg/dL   Calcium 8.8 (L) 8.9 - 10.3 mg/dL   Total Protein 6.7 6.5 - 8.1 g/dL   Albumin 3.0 (L) 3.5 - 5.0 g/dL   AST 14 (L) 15 - 41 U/L   ALT 14 0 - 44 U/L   Alkaline Phosphatase 162 (H) 38 - 126 U/L   Total Bilirubin 0.5 0.3 - 1.2 mg/dL   GFR calc non Af Amer >60 >60 mL/min   GFR calc Af Amer >60 >60 mL/min   Anion gap 11 5 - 15  Type and screen [facility]     Status: None   Collection Time: 07/15/18  5:11 PM  Result Value Ref Range   ABO/RH(D) A POS    Antibody Screen NEG    Sample Expiration      07/18/2018 Performed at [facility], [address], [place], [place] [address]   Urinalysis, Routine w reflex microscopic     Status: Abnormal   Collection Time: 07/15/18  5:49 PM  Result Value Ref Range   Color, Urine AMBER (A) YELLOW   APPearance CLOUDY (A) CLEAR   Specific Gravity, Urine 1.024 1.005 - 1.030   pH 7.0 5.0 - 8.0   Glucose, UA NEGATIVE NEGATIVE mg/dL   Hgb urine dipstick NEGATIVE NEGATIVE   Bilirubin Urine NEGATIVE NEGATIVE   Ketones, ur 80 (A) NEGATIVE mg/dL   Protein, ur 540 (A) NEGATIVE mg/dL   Nitrite NEGATIVE NEGATIVE    Leukocytes, UA TRACE (A) NEGATIVE   RBC / HPF 0-5 0 - 5 RBC/hpf   WBC, UA 6-10 0 - 5 WBC/hpf   Bacteria, UA RARE (A) NONE SEEN   Squamous Epithelial / LPF 11-20 0 - 5   Mucus PRESENT   Protein / creatinine ratio, urine     Status: None   Collection Time: 07/15/18  5:49 PM  Result Value Ref Range   Creatinine, Urine 320.00 mg/dL   Total Protein, Urine 47 mg/dL   Protein Creatinine Ratio 0.15 0.00 - 0.15 mg/mg[Cre]  Fern Test     Status: None   Collection Time: 07/15/18  6:03 PM  Result Value Ref Range   POCT Fern Test Negative = intact amniotic membranes    Dr Adrian Blackwater to bedside in MAU to assess FHR.  BP and FHR increased to baseline with IVF. Cervical examination and scalp stimulation performed.  Dilation: 2.5 Effacement (%): Thick Cervical Position: Posterior Station: -3 Presentation: Vertex Exam by:: V.  Aundria Rud, CNM  FHR: 120/ moderate/ +accels/ one variable deceleration  Toco: 2-3 minutes   No cervical change upon reassessment. Patient reports HA is relieved with medication treatment of Tylenol. Consult with Dr Adrian Blackwater with plan of care- recommends admission to antenatal for observation.   Discussed plan of care with patient. Orders placed by Dr Adrian Blackwater and care taken over by Dr Adrian Blackwater.   Assessment and Plan   1. BPP 6/8 with occasional bradycardia  2. Preterm uterine contractions   Admit to antenatal  Orders placed  Care taken over by Dr Adrian Blackwater   Sharyon Cable CNM 07/15/2018, 7:36 PM    Attestation of Attending Supervision of Advanced Practitioner (PA/CNM/NP): Evaluation and management procedures were performed by the Advanced Practitioner under my supervision and collaboration.  I have reviewed the Advanced Practitioner's note and chart, and I agree with the management and plan.  Candelaria Celeste, DO Attending Physician Faculty Practice, [facility]

## 2018-07-16 NOTE — Progress Notes (Signed)
Bedside ultrasound complete

## 2018-07-16 NOTE — Progress Notes (Signed)
Patient ID: Cala BradfordKazual Loschiavo, female   DOB: August 02, 1996, 22 y.o.   MRN: [id number]  Called for contractions. Improved after patient got up to bathroom, IVF, percocet x1. Cervix unchanged.   Will get BPP this AM. If remains reassuring, can d/c to home.  Levie HeritageJacob J Stinson, DO 07/16/2018 6:38 AM

## 2018-07-16 NOTE — Progress Notes (Addendum)
Traci Castro COMPREHENSIVE PROGRESS NOTE  Traci Castro is a 22 y.o. G3P1011 at 26w3dwho is admitted for fetal bradycardia, BPP 6/8 in the setting of maternal hypotensive episodes.  Estimated Date of Delivery: 08/17/18 Fetal presentation is cephalic.  Length of Stay:  0 Days. Admitted 07/15/2018  Subjective: Reports feeling lightheaded with ambulation and weak.  No abdominal pain. No vaginal bleeding. Denies any abnormal vaginal discharge, fevers, chills, sweats, dysuria, back pain, chest pain, SOB, cough, nausea, vomiting, other GI or GU symptoms or other general symptoms. Patient has received multiple boluses of IV fluids for her hypotension. Patient reports good fetal movement.  She reports no uterine contractions, no bleeding and no loss of fluid per vagina.  Vitals:  Blood pressure (!) 98/47, pulse 95, temperature 98.2 F (36.8 C), temperature source Oral, resp. rate 18, height '5\' 3"'  (1.6 m), weight 129.6 kg, last menstrual period 10/19/2017, SpO2 100 %.  Patient Vitals for the past 24 hrs:  BP Temp Temp src Pulse Resp SpO2 Height Weight  07/16/18 0935 - - - - - 100 % - -  07/16/18 0930 - - - - - 100 % - -  07/16/18 0925 - - - - - 100 % - -  07/16/18 0920 - - - - - 100 % - -  07/16/18 0855 (!) 98/47 - - 95 - - - -  07/16/18 0850 - - - - - 98 % - -  07/16/18 0845 - - - - - 99 % - -  07/16/18 0840 - - - - - 99 % - -  07/16/18 0820 (!) 88/42 98.2 F (36.8 C) Oral 95 18 100 % - -  07/16/18 0801 (!) 98/35 - - 97 - - - -  07/16/18 0800 - - - - - 99 % - -  07/16/18 0548 106/61 98.3 F (36.8 C) Oral 97 17 99 % - -  07/15/18 2328 (!) 101/57 97.7 F (36.5 C) Oral (!) 101 20 99 % - -  07/15/18 1900 - - - - - - '5\' 3"'  (1.6 m) 129.6 kg  07/15/18 1715 - - - - - 100 % - -  07/15/18 1710 - - - - - 100 % - -  07/15/18 1705 - - - - - 100 % - -  07/15/18 1701 111/64 - - (!) 106 - - - -  07/15/18 1652 (!) 97/52 - - (!) 105 - - - -  07/15/18 1643 (!) 92/34 98.1 F (36.7 C) Oral  (!) 104 18 100 % - -    Physical Examination: CONSTITUTIONAL: Well-developed, well-nourished female in no acute distress.  HENT:  Normocephalic, atraumatic, External right and left ear normal. Oropharynx is clear and moist EYES: Conjunctivae and EOM are normal. Pupils are equal, round, and reactive to light. No scleral icterus.  NECK: Normal range of motion, supple, no masses SKIN: Skin is warm and dry. No rash noted. Not diaphoretic. No erythema. No pallor. [place] Alert and oriented to person, place, and time. Normal reflexes, muscle tone coordination. No cranial nerve deficit noted. PSYCHIATRIC: Normal mood and affect. Normal behavior. Normal judgment and thought content. CARDIOVASCULAR: Normal heart rate noted, regular rhythm RESPIRATORY: Effort and breath sounds normal, no problems with respiration noted MUSCULOSKELETAL: Normal range of motion. No edema and no tenderness. 2+ distal pulses. ABDOMEN: Soft, nontender, nondistended, gravid. CERVIX: Dilation: 2.5 Effacement (%): Thick Cervical Position: Posterior Station: -3 Presentation: Vertex Exam by:: Dr. SNehemiah Settle Fetal monitoring: FHR: 100-110 bpm, Variability: moderate,  Accelerations: Present, Decelerations: Absent  Uterine activity: 0-1contractions per hour  Results for orders placed or performed during the hospital encounter of 07/15/18 (from the past 48 hour(s))  CBC with Differential/Platelet     Status: Abnormal   Collection Time: 07/15/18  5:11 PM  Result Value Ref Range   WBC 12.3 (H) 4.0 - 10.5 K/uL   RBC 4.03 3.87 - 5.11 MIL/uL   Hemoglobin 9.2 (L) 12.0 - 15.0 g/dL   HCT 30.6 (L) 36.0 - 46.0 %   MCV 75.9 (L) 80.0 - 100.0 fL   MCH 22.8 (L) 26.0 - 34.0 pg   MCHC 30.1 30.0 - 36.0 g/dL   RDW 15.4 11.5 - 15.5 %   Platelets 331 150 - 400 K/uL   nRBC 0.0 0.0 - 0.2 %   Neutrophils Relative % 67 %   Neutro Abs 8.2 (H) 1.7 - 7.7 K/uL   Lymphocytes Relative 28 %   Lymphs Abs 3.4 0.7 - 4.0 K/uL   Monocytes Relative 5  %   Monocytes Absolute 0.6 0.1 - 1.0 K/uL   Eosinophils Relative 0 %   Eosinophils Absolute 0.0 0.0 - 0.5 K/uL   Basophils Relative 0 %   Basophils Absolute 0.0 0.0 - 0.1 K/uL    Comment: Performed at [facility], [address], [place], [place] [address]  Comprehensive metabolic panel     Status: Abnormal   Collection Time: 07/15/18  5:11 PM  Result Value Ref Range   Sodium 136 135 - 145 mmol/L   Potassium 3.9 3.5 - 5.1 mmol/L   Chloride 104 98 - 111 mmol/L   CO2 21 (L) 22 - 32 mmol/L   Glucose, Bld 72 70 - 99 mg/dL   BUN 6 6 - 20 mg/dL   Creatinine, Ser 0.60 0.44 - 1.00 mg/dL   Calcium 8.8 (L) 8.9 - 10.3 mg/dL   Total Protein 6.7 6.5 - 8.1 g/dL   Albumin 3.0 (L) 3.5 - 5.0 g/dL   AST 14 (L) 15 - 41 U/L   ALT 14 0 - 44 U/L   Alkaline Phosphatase 162 (H) 38 - 126 U/L   Total Bilirubin 0.5 0.3 - 1.2 mg/dL   GFR calc non Af Amer >60 >60 mL/min   GFR calc Af Amer >60 >60 mL/min    Comment: (NOTE) The eGFR has been calculated using the CKD EPI equation. This calculation has not been validated in all clinical situations. eGFR's persistently <60 mL/min signify possible Chronic Kidney Disease.    Anion gap 11 5 - 15    Comment: Performed at [facility], [address] [place], [place] [address]  Type and screen [place]     Status: None   Collection Time: 07/15/18  5:11 PM  Result Value Ref Range   ABO/RH(D) A POS    Antibody Screen NEG    Sample Expiration      07/18/2018 Performed at [facility], [address] [place], [place] [address]   Urinalysis, Routine w reflex microscopic     Status: Abnormal   Collection Time: 07/15/18  5:49 PM  Result Value Ref Range   Color, Urine AMBER (A) YELLOW    Comment: BIOCHEMICALS MAY BE AFFECTED BY COLOR   APPearance CLOUDY (A) CLEAR   Specific Gravity, Urine 1.024 1.005 - 1.030   pH 7.0 5.0 - 8.0   Glucose, UA NEGATIVE NEGATIVE mg/dL   Hgb urine dipstick NEGATIVE NEGATIVE   Bilirubin Urine  NEGATIVE NEGATIVE   Ketones,  ur 80 (A) NEGATIVE mg/dL   Protein, ur 100 (A) NEGATIVE mg/dL   Nitrite NEGATIVE NEGATIVE   Leukocytes, UA TRACE (A) NEGATIVE   RBC / HPF 0-5 0 - 5 RBC/hpf   WBC, UA 6-10 0 - 5 WBC/hpf   Bacteria, UA RARE (A) NONE SEEN   Squamous Epithelial / LPF 11-20 0 - 5   Mucus PRESENT     Comment: Performed at [facility], [address], [place], [place] [address]  Protein / creatinine ratio, urine     Status: None   Collection Time: 07/15/18  5:49 PM  Result Value Ref Range   Creatinine, Urine 320.00 mg/dL   Total Protein, Urine 47 mg/dL    Comment: NO NORMAL RANGE ESTABLISHED FOR THIS TEST   Protein Creatinine Ratio 0.15 0.00 - 0.15 mg/mg[Cre]    Comment: Performed at [facility], [address] [place], [place] [address]  Maryann Alar Test     Status: None   Collection Time: 07/15/18  6:03 PM  Result Value Ref Range   POCT Fern Test Negative = intact amniotic membranes     [place] Mfm Fetal Bpp Wo Non Stress  Result Date: 07/15/2018 ----------------------------------------------------------------------  OBSTETRICS REPORT                       (Signed Final 07/15/2018 05:05 pm) ---------------------------------------------------------------------- Patient Info  ID #:       [id number]                          D.O.B.:  02-19-1996 (21 yrs)  Name:       Blythe Stanford                   Visit Date: 07/15/2018 04:14 pm ---------------------------------------------------------------------- Performed By  Performed By:     Rodrigo Ran BS      Ref. Address:     [place]  [place]  Attending:        Tama High MD        Location:         [facility]  Referred By:      [facility] for                     [place] ---------------------------------------------------------------------- Orders   #  Description                          Code         Ordered By   1  [place] MFM OB FOLLOW UP                  [id number]     Darrol Poke   2  [place] MFM FETAL BPP WO NON              76819.01     VERONICA ROGERS      STRESS  ----------------------------------------------------------------------   #  Order #                    Accession #                 Episode #   1  [id number]                  [id number]                  [id number]   2  [id number]                  [id number]                  [id number]  ---------------------------------------------------------------------- Indications   Obesity complicating pregnancy, third          O99.213   trimester   Anemia during pregnancy in third trimester     O99.013   Uterine size-date discrepancy, third trimester O26.843   (S>D)   Abnormality fetal heart rate or rhythm, 3rd    O36.833   trimester   [redacted] weeks gestation of pregnancy                Z3A.35  ---------------------------------------------------------------------- Vital Signs                                                 Height:        5'3" ---------------------------------------------------------------------- Fetal Evaluation  Num Of Fetuses:         1  Fetal Heart Rate(bpm):  98  Cardiac Activity:       Bradycardia  Presentation:           Cephalic  Placenta:               Posterior  P. Cord Insertion:      Visualized  Amniotic Fluid  AFI FV:      Within normal limits  AFI Sum(cm)     %Tile       Largest Pocket(cm)  19.81           74          7.26  RUQ(cm)  RLQ(cm)       LUQ(cm)        LLQ(cm)  4.72          5.95          1.88           7.26 ---------------------------------------------------------------------- Biophysical Evaluation  Amniotic F.V:   Within normal limits       F. Tone:        Observed  F. Movement:    Observed                   Score:          6/8  F.  Breathing:   Not Observed ---------------------------------------------------------------------- Biometry  BPD:      91.8  mm     G. Age:  37w 2d         94  %    CI:        76.71   %    70 - 86                                                          FL/HC:      18.2   %    20.1 - 22.3  HC:       332   mm     G. Age:  37w 6d         79  %    HC/AC:      1.03        0.93 - 1.11  AC:      322.9  mm     G. Age:  36w 1d         81  %    FL/BPD:     65.9   %    71 - 87  FL:       60.5  mm     G. Age:  31w 3d        < 3  %    FL/AC:      18.7   %    20 - 24  HUM:      59.7  mm     G. Age:  34w 5d         51  %  Est. FW:    2634  gm    5 lb 13 oz      62  % ---------------------------------------------------------------------- OB History  Gravidity:    3         Term:   1  TOP:          1        Living:  1 ---------------------------------------------------------------------- Gestational Age  U/S Today:     35w 5d                                        EDD:   08/14/18  Best:          35w 2d     Det. By:  Previous Ultrasound      EDD:   08/17/18                                      (  02/10/18) ---------------------------------------------------------------------- Anatomy  Cranium:               Appears normal         Aortic Arch:            Previously seen  Cavum:                 Appears normal         Ductal Arch:            Previously seen  Ventricles:            Appears normal         Diaphragm:              Appears normal  Choroid Plexus:        Appears normal         Stomach:                Appears normal, left                                                                        sided  Cerebellum:            Previously seen        Abdomen:                Appears normal  Posterior Fossa:       Previously seen        Abdominal Wall:         Previously seen  Nuchal Fold:           Previously seen        Cord Vessels:           Previously seen  Face:                  Appears normal         Kidneys:                Appear  normal                         (orbits and profile)  Lips:                  Appears normal         Bladder:                Appears normal  Thoracic:              Appears normal         Spine:                  Previously seen  Heart:                 Appears normal         Upper Extremities:      Previously seen                         (4CH, axis, and situs  RVOT:                  Previously seen  Lower Extremities:      Previously seen  LVOT:                  Appears normal  Other:  Heels and 5th digit previously visualized. Nasal bone visualized. ---------------------------------------------------------------------- Cervix Uterus Adnexa  Cervix  Not visualized (advanced GA >24wks) ---------------------------------------------------------------------- Impression  Patient returned for fetal growth assessment.  Amniotic fluid is normal and good fetal activity is seen. Fetal  growth is appropriate for gestational age. Fetal bradycardia  (98 bpm) was seen with good recovery. Antenatal testing was  performed because of bradycardia. Fetal breathing  movements did not meet the criteria. BPP 6/8.  Patient was sent over to MAU for NST. ---------------------------------------------------------------------- Recommendations  -An appointment was made for her to return next week for  BPP and NST. ----------------------------------------------------------------------                  Tama High, MD Electronically Signed Final Report   07/15/2018 05:05 pm ----------------------------------------------------------------------  [place] Mfm Ob Follow Up  Result Date: 07/15/2018 ----------------------------------------------------------------------  OBSTETRICS REPORT                       (Signed Final 07/15/2018 05:05 pm) ---------------------------------------------------------------------- Patient Info  ID #:       [id number]                          D.O.B.:  Jun 30, 1996 (21 yrs)  Name:       Blythe Stanford                   Visit  Date: 07/15/2018 04:14 pm ---------------------------------------------------------------------- Performed By  Performed By:     Rodrigo Ran BS      Ref. Address:     [place]                                                             [place]                                                             [place]  Attending:        Tama High MD  [facility]  Referred By:      [facility] for                    [place] ---------------------------------------------------------------------- Orders   #  Description                          Code         Ordered By   1  [place] MFM OB FOLLOW UP                  [phone]     Darrol Poke   2  [place] MFM FETAL BPP WO NON              76819.01     VERONICA ROGERS      STRESS  ----------------------------------------------------------------------   #  Order #                    Accession #                 Episode #   1  [id number]                  [id number]                  [id number]   2  [id number]                  [id number]  ---------------------------------------------------------------------- Indications   Obesity complicating pregnancy, third          O99.213   trimester   Anemia during pregnancy in third trimester     O99.013   Uterine size-date discrepancy, third trimester O26.843   (S>D)   Abnormality fetal heart rate or rhythm, 3rd    O36.833   trimester   [redacted] weeks gestation of pregnancy                Z3A.35  ---------------------------------------------------------------------- Vital Signs                                                 Height:        5'3" ---------------------------------------------------------------------- Fetal Evaluation  Num Of Fetuses:         1  Fetal Heart Rate(bpm):  98  Cardiac  Activity:       Bradycardia  Presentation:           Cephalic  Placenta:               Posterior  P. Cord Insertion:      Visualized  Amniotic Fluid  AFI FV:      Within normal limits  AFI Sum(cm)     %Tile       Largest Pocket(cm)  19.81           74          7.26  RUQ(cm)       RLQ(cm)       LUQ(cm)        LLQ(cm)  4.72          5.95          1.88           7.26 ---------------------------------------------------------------------- Biophysical Evaluation  Amniotic F.V:   Within normal limits       F. Tone:        Observed  F. Movement:    Observed                   Score:          6/8  F. Breathing:   Not Observed ---------------------------------------------------------------------- Biometry  BPD:      91.8  mm     G. Age:  37w 2d         94  %    CI:        76.71   %    70 - 86                                                          FL/HC:      18.2   %    20.1 - 22.3  HC:       332   mm     G. Age:  37w 6d         79  %    HC/AC:      1.03        0.93 - 1.11  AC:      322.9  mm     G. Age:  36w 1d         81  %    FL/BPD:     65.9   %    71 - 87  FL:       60.5  mm     G. Age:  31w 3d        < 3  %    FL/AC:      18.7   %    20 - 24  HUM:      59.7  mm     G. Age:  34w 5d         51  %  Est. FW:    2634  gm    5 lb 13 oz      62  % ---------------------------------------------------------------------- OB History  Gravidity:    3         Term:   1  TOP:          1        Living:  1 ---------------------------------------------------------------------- Gestational Age  U/S Today:     35w 5d                                        EDD:   08/14/18  Best:          35w 2d     Det. By:  Previous Ultrasound      EDD:   08/17/18                                      (02/10/18) ----------------------------------------------------------------------  Anatomy  Cranium:               Appears normal         Aortic Arch:            Previously seen  Cavum:                 Appears normal         Ductal Arch:            Previously  seen  Ventricles:            Appears normal         Diaphragm:              Appears normal  Choroid Plexus:        Appears normal         Stomach:                Appears normal, left                                                                        sided  Cerebellum:            Previously seen        Abdomen:                Appears normal  Posterior Fossa:       Previously seen        Abdominal Wall:         Previously seen  Nuchal Fold:           Previously seen        Cord Vessels:           Previously seen  Face:                  Appears normal         Kidneys:                Appear normal                         (orbits and profile)  Lips:                  Appears normal         Bladder:                Appears normal  Thoracic:              Appears normal         Spine:                  Previously seen  Heart:                 Appears normal         Upper Extremities:      Previously seen                         (4CH, axis, and situs  RVOT:                  Previously seen  Lower Extremities:      Previously seen  LVOT:                  Appears normal  Other:  Heels and 5th digit previously visualized. Nasal bone visualized. ---------------------------------------------------------------------- Cervix Uterus Adnexa  Cervix  Not visualized (advanced GA >24wks) ---------------------------------------------------------------------- Impression  Patient returned for fetal growth assessment.  Amniotic fluid is normal and good fetal activity is seen. Fetal  growth is appropriate for gestational age. Fetal bradycardia  (98 bpm) was seen with good recovery. Antenatal testing was  performed because of bradycardia. Fetal breathing  movements did not meet the criteria. BPP 6/8.  Patient was sent over to MAU for NST. ---------------------------------------------------------------------- Recommendations  -An appointment was made for her to return next week for  BPP and NST.  ----------------------------------------------------------------------                  Tama High, MD Electronically Signed Final Report   07/15/2018 05:05 pm ----------------------------------------------------------------------   Current scheduled medications . sodium chloride   Intravenous Once  . acetaminophen  1,000 mg Oral Once  . cephALEXin  500 mg Oral QHS  . diphenhydrAMINE  25 mg Oral Once  . docusate sodium  100 mg Oral Daily  . famotidine  20 mg Oral BID  . prenatal multivitamin  1 tablet Oral Q1200    I have reviewed the patient's current medications.  ASSESSMENT: Principal Problem:   Fetal bradycardia, antepartum condition or complication Active Problems:   Hypotension, unspecified   Maternal morbid obesity, antepartum ([place])   Anemia in pregnancy, third trimester   PLAN: Unsure of etiology of hypotension, FHR decelerations tend to occur when her BP is very low.  Currently, reactive and reassuring. Bedside BPP was 8/10 (-breathing). She will receive 2nd dose of betamethasone this evening. Patient has anemia and is symptomatic.  Stable hemoglobin for the past month ~ 9.0. But given her symptoms currently, and no other clear etiology, she was counseled about transfusion to help improve intravascular volume and hemoglobin,  in lieu of more IV fluids (worried about overload and associated consequences). She agreed to this plan, 2 units of pRBCs ordered.  Various other labs ordered for evaluation of hypotension; EKG also ordered. Will follow up results and manage accordingly. Continue inpatient antenatal care for now.   Verita Schneiders, MD, [place] for [employer], [place]

## 2018-07-17 ENCOUNTER — Encounter (HOSPITAL_COMMUNITY): Payer: Self-pay | Admitting: *Deleted

## 2018-07-17 DIAGNOSIS — O368330 Maternal care for abnormalities of the fetal heart rate or rhythm, third trimester, not applicable or unspecified: Secondary | ICD-10-CM | POA: Diagnosis not present

## 2018-07-17 DIAGNOSIS — Z3A35 35 weeks gestation of pregnancy: Secondary | ICD-10-CM | POA: Diagnosis not present

## 2018-07-17 LAB — CBC WITH DIFFERENTIAL/PLATELET
Basophils Absolute: 0 10*3/uL (ref 0.0–0.1)
Basophils Relative: 0 %
EOS ABS: 0.1 10*3/uL (ref 0.0–0.5)
EOS PCT: 1 %
HCT: 30 % — ABNORMAL LOW (ref 36.0–46.0)
HEMOGLOBIN: 9.1 g/dL — AB (ref 12.0–15.0)
Lymphocytes Relative: 36 %
Lymphs Abs: 4.4 10*3/uL — ABNORMAL HIGH (ref 0.7–4.0)
MCH: 23.3 pg — AB (ref 26.0–34.0)
MCHC: 30.3 g/dL (ref 30.0–36.0)
MCV: 76.7 fL — ABNORMAL LOW (ref 80.0–100.0)
MONO ABS: 0.8 10*3/uL (ref 0.1–1.0)
MONOS PCT: 7 %
Neutro Abs: 7.1 10*3/uL (ref 1.7–7.7)
Neutrophils Relative %: 56 %
Platelets: 304 10*3/uL (ref 150–400)
RBC: 3.91 MIL/uL (ref 3.87–5.11)
RDW: 16 % — ABNORMAL HIGH (ref 11.5–15.5)
WBC: 12.4 10*3/uL — ABNORMAL HIGH (ref 4.0–10.5)
nRBC: 0 % (ref 0.0–0.2)

## 2018-07-17 LAB — COMPREHENSIVE METABOLIC PANEL
ALK PHOS: 123 U/L (ref 38–126)
ALT: 12 U/L (ref 0–44)
ANION GAP: 9 (ref 5–15)
AST: 14 U/L — ABNORMAL LOW (ref 15–41)
Albumin: 2.7 g/dL — ABNORMAL LOW (ref 3.5–5.0)
BILIRUBIN TOTAL: 0.3 mg/dL (ref 0.3–1.2)
BUN: <5 mg/dL — ABNORMAL LOW (ref 6–20)
CALCIUM: 8.2 mg/dL — AB (ref 8.9–10.3)
CO2: 20 mmol/L — ABNORMAL LOW (ref 22–32)
Chloride: 107 mmol/L (ref 98–111)
Creatinine, Ser: 0.47 mg/dL (ref 0.44–1.00)
GFR calc non Af Amer: >60 mL/min (ref >60)
Glucose, Bld: 105 mg/dL — ABNORMAL HIGH (ref 70–99)
Potassium: 3.7 mmol/L (ref 3.5–5.1)
SODIUM: 136 mmol/L (ref 135–145)
TOTAL PROTEIN: 6.2 g/dL — AB (ref 6.5–8.1)

## 2018-07-17 LAB — TYPE AND SCREEN
ABO/RH(D): A POS
Antibody Screen: NEGATIVE
Unit division: 0
Unit division: 0

## 2018-07-17 LAB — BPAM RBC
Blood Product Expiration Date: 201912182359
Blood Product Expiration Date: 201912192359
ISSUE DATE / TIME: 201911201140
ISSUE DATE / TIME: 201911201401
Unit Type and Rh: 6200
Unit Type and Rh: 6200

## 2018-07-17 LAB — CULTURE, OB URINE

## 2018-07-17 MED ORDER — FERROUS SULFATE 325 (65 FE) MG PO TABS: 325.0000 mg | ORAL_TABLET | Freq: Two times a day (BID) | ORAL | 1 refills | Status: DC

## 2018-07-17 MED ORDER — DOCUSATE SODIUM 100 MG PO CAPS: 100.0000 mg | ORAL_CAPSULE | Freq: Two times a day (BID) | ORAL | 2 refills | Status: DC | PRN

## 2018-07-17 MED ORDER — BETAMETHASONE SOD PHOS & ACET 6 (3-3) MG/ML IJ SUSP
12.0000 mg | Freq: Once | INTRAMUSCULAR | Status: AC
  Administered 2018-07-17: 12 mg via INTRAMUSCULAR
  Filled 2018-07-17: qty 2

## 2018-07-17 NOTE — Discharge Instructions (Signed)
Pregnancy and Anemia Anemia is a condition in which the concentration of red blood cells or hemoglobin in the blood is below normal. Hemoglobin is a substance in red blood cells that carries oxygen to the tissues of the body. Anemia results in not enough oxygen reaching these tissues. Anemia during pregnancy is common because the fetus uses more iron and folic acid as it is developing. Your body may not produce enough red blood cells because of this. Also, during pregnancy, the liquid part of the blood (plasma) increases by about 50%, and the red blood cells increase by only 25%. This lowers the concentration of the red blood cells and creates a natural anemia-like situation. What are the causes? The most common cause of anemia during pregnancy is not having enough iron in the body to make red blood cells (iron deficiency anemia). Other causes may include:  Folic acid deficiency.  Vitamin B12 deficiency.  Certain prescription or over-the-counter medicines.  Certain medical conditions or infections that destroy red blood cells.  A low platelet count and bleeding caused by antibodies that go through the placenta to the fetus from the mothers blood.  What are the signs or symptoms? Mild anemia may not be noticeable. If it becomes severe, symptoms may include:  Tiredness.  Shortness of breath, especially with exercise.  Weakness.  Fainting.  Pale looking skin.  Headaches.  Feeling a fast or irregular heartbeat (palpitations).  How is this diagnosed? The type of anemia is usually diagnosed from your family and medical history and blood tests. How is this treated? Treatment of anemia during pregnancy depends on the cause of the anemia. Treatment can include:  Supplements of iron, vitamin B12, or folic acid.  A blood transfusion. This may be needed if blood loss is severe.  Hospitalization. This may be needed if there is significant continual blood loss.  Dietary  changes.  Follow these instructions at home:  Follow your dietitian's or health care provider's dietary recommendations.  Increase your vitamin C intake. This will help the stomach absorb more iron.  Eat a diet rich in iron. This would include foods such as: ? Liver. ? Beef. ? Whole grain bread. ? Eggs. ? Dried fruit.  Take iron and vitamins as directed by your health care provider.  Eat green leafy vegetables. These are a good source of folic acid. Contact a health care provider if:  You have frequent or lasting headaches.  You are looking pale.  You are bruising easily. Get help right away if:  You have extreme weakness, shortness of breath, or chest pain.  You become dizzy or have trouble concentrating.  You have heavy vaginal bleeding.  You develop a rash.  You have bloody or black, tarry stools.  You faint.  You vomit up blood.  You vomit repeatedly.  You have abdominal pain.  You have a fever or persistent symptoms for more than 2-3 days.  You have a fever and your symptoms suddenly get worse.  You are dehydrated. This information is not intended to replace advice given to you by your health care provider. Make sure you discuss any questions you have with your health care provider. Document Released: 08/10/2000 Document Revised: 01/19/2016 Document Reviewed: 03/25/2013 Elsevier Interactive Patient Education  2017 [employer]   Dizziness Dizziness is a common problem. It is a feeling of unsteadiness or light-headedness. You may feel like you are about to faint. Dizziness can lead to injury if you stumble or fall. Anyone can become dizzy, but  dizziness is more common in older adults. This condition can be caused by a number of things, including medicines, dehydration, or illness. Follow these instructions at home: Eating and drinking  Drink enough fluid to keep your urine clear or pale yellow. This helps to keep you from becoming dehydrated. Try to  drink more clear fluids, such as water.  Do not drink alcohol.  Limit your caffeine intake if told to do so by your health care provider. Check ingredients and nutrition facts to see if a food or beverage contains caffeine.  Limit your salt (sodium) intake if told to do so by your health care provider. Check ingredients and nutrition facts to see if a food or beverage contains sodium. Activity  Avoid making quick movements. ? Rise slowly from chairs and steady yourself until you feel okay. ? In the morning, first sit up on the side of the bed. When you feel okay, stand slowly while you hold onto something until you know that your balance is fine.  If you need to stand in one place for a long time, move your legs often. Tighten and relax the muscles in your legs while you are standing.  Do not drive or use heavy machinery if you feel dizzy.  Avoid bending down if you feel dizzy. Place items in your home so that they are easy for you to reach without leaning over. Lifestyle  Do not use any products that contain nicotine or tobacco, such as cigarettes and e-cigarettes. If you need help quitting, ask your health care provider.  Try to reduce your stress level by using methods such as yoga or meditation. Talk with your health care provider if you need help to manage your stress. General instructions  Watch your dizziness for any changes.  Take over-the-counter and prescription medicines only as told by your health care provider. Talk with your health care provider if you think that your dizziness is caused by a medicine that you are taking.  Tell a friend or a family member that you are feeling dizzy. If he or she notices any changes in your behavior, have this person call your health care provider.  Keep all follow-up visits as told by your health care provider. This is important. Contact a health care provider if:  Your dizziness does not go away.  Your dizziness or light-headedness  gets worse.  You feel nauseous.  You have reduced hearing.  You have new symptoms.  You are unsteady on your feet or you feel like the room is spinning. Get help right away if:  You vomit or have diarrhea and are unable to eat or drink anything.  You have problems talking, walking, swallowing, or using your arms, hands, or legs.  You feel generally weak.  You are not thinking clearly or you have trouble forming sentences. It may take a friend or family member to notice this.  You have chest pain, abdominal pain, shortness of breath, or sweating.  Your vision changes.  You have any bleeding.  You have a severe headache.  You have neck pain or a stiff neck.  You have a fever. These symptoms may represent a serious problem that is an emergency. Do not wait to see if the symptoms will go away. Get medical help right away. Call your local emergency services (911 in the [place] Do not drive yourself to the hospital. Summary  Dizziness is a feeling of unsteadiness or light-headedness. This condition can be caused by a number of  things, including medicines, dehydration, or illness.  Anyone can become dizzy, but dizziness is more common in older adults.  Drink enough fluid to keep your urine clear or pale yellow. Do not drink alcohol.  Avoid making quick movements if you feel dizzy. Monitor your dizziness for any changes. This information is not intended to replace advice given to you by your health care provider. Make sure you discuss any questions you have with your health care provider. Document Released: 02/06/2001 Document Revised: 09/15/2016 Document Reviewed: 09/15/2016 [employer] Interactive Patient Education  [employer].

## 2018-07-17 NOTE — Discharge Summary (Signed)
Antenatal Physician Discharge Summary  Patient ID: Traci Castro MRN: [id number] DOB/AGE: 09-15-1995 21 y.o.  Admit date: 07/15/2018 Discharge date: 07/17/2018  Admission and Discharge Diagnoses: Principal Problem:   Fetal bradycardia, antepartum condition or complication Active Problems:   Hypotension, unspecified   Maternal morbid obesity, antepartum (HCC)   Anemia in pregnancy, third trimester  Prenatal Procedures: NST and ultrasound  Hospital Course:  This is a 22 y.o. G3P1011 with IUP at [redacted]w[redacted]d admitted for fetal bradycardia noted during routine scan.  FHR was 90-110's in [place] and BPP was 6/8 (off for breathing).  Shortly after admission, she was noted to have episodes of hypotension to 80-90s/40-50s alleviated by IV fluid boluses.  During these episodes, she was noted to have lightheadedness, dizziness and FHR decelerations that all resolved with IV fluids. Repeat BPP was 8/10 (off for breathing) This happened a few times, no clear etiology found for her hypotension and symptoms other that anemia with hemoglobin of 8.3, but she was not tachycardic. No signs of infection/sepsis, cardiac compromise, vertigo, thyroid dysfunction, glycemic dysfunction, or other etiologies.  She was transfused with two units of blood, her vitals stabilized.  She received betamethasone x 2 doses. Fetal heart rate monitoring remained reassuring, and she had no signs/symptoms of progressing preterm labor or other maternal-fetal concerns.  She still reported some dizziness and lightheadedness despite increased hemoglobin of 9.1.  Given that no other acute etiology was found for her symptoms and FHR tracing was reassuring, and her vitals were stable, she was deemed stable for discharge to home with outpatient follow up.  Discharge Exam: Temp:  [98 F (36.7 C)-98.6 F (37 C)] 98 F (36.7 C) (11/21 1139) Pulse Rate:  [86-105] 93 (11/21 1139) Resp:  [18-20] 18 (11/21 1139) BP: (98-115)/(49-76) 112/63 (11/21  1139) SpO2:  [97 %-100 %] 100 % (11/21 1139)   Vitals:   07/16/18 2332 07/17/18 0511 07/17/18 0757 07/17/18 1139  BP: (!) 112/49 (!) 112/50 (!) 112/59 112/63  Pulse: 99 91 95 93  Resp: 20 20 18 18   Temp: 98.6 F (37 C) 98.4 F (36.9 C) 98.2 F (36.8 C) 98 F (36.7 C)  TempSrc: Oral Oral Oral Oral  SpO2: 100% 100% 97% 100%  Weight:      Height:       Physical Examination: CONSTITUTIONAL: Well-developed, well-nourished female in no acute distress.  HENT:  Normocephalic, atraumatic, External right and left ear normal. Oropharynx is clear and moist EYES: Conjunctivae and EOM are normal. Pupils are equal, round, and reactive to light. No scleral icterus.  NECK: Normal range of motion, supple, no masses SKIN: Skin is warm and dry. No rash noted. Not diaphoretic. No erythema. No pallor. NEUROLGIC: Alert and oriented to person, place, and time. Normal reflexes, muscle tone coordination. No cranial nerve deficit noted. PSYCHIATRIC: Normal mood and affect. Normal behavior. Normal judgment and thought content. CARDIOVASCULAR: Normal heart rate noted, regular rhythm RESPIRATORY: Effort and breath sounds normal, no problems with respiration noted MUSCULOSKELETAL: Normal range of motion. No edema and no tenderness. 2+ distal pulses. ABDOMEN: Soft, nontender, nondistended, gravid. CERVIX: Dilation: 2.5 Effacement (%): Thick Cervical Position: Posterior Station: -3 Presentation: Vertex Exam by:: Dr. Adrian Blackwater  on admission  Fetal monitoring: FHR: 130 bpm, Variability: moderate, Accelerations: Present, Decelerations: Absent  Uterine activity: Rare contractions  Significant Diagnostic Studies:  Results for orders placed or performed during the hospital encounter of 07/15/18 (from the past 168 hour(s))  CBC with Differential/Platelet   Collection Time: 07/15/18  5:11 PM  Result  Value Ref Range   WBC 12.3 (H) 4.0 - 10.5 K/uL   RBC 4.03 3.87 - 5.11 MIL/uL   Hemoglobin 9.2 (L) 12.0 - 15.0 g/dL    HCT 16.1 (L) 09.6 - 46.0 %   MCV 75.9 (L) 80.0 - 100.0 fL   MCH 22.8 (L) 26.0 - 34.0 pg   MCHC 30.1 30.0 - 36.0 g/dL   RDW 04.5 40.9 - 81.1 %   Platelets 331 150 - 400 K/uL   nRBC 0.0 0.0 - 0.2 %   Neutrophils Relative % 67 %   Neutro Abs 8.2 (H) 1.7 - 7.7 K/uL   Lymphocytes Relative 28 %   Lymphs Abs 3.4 0.7 - 4.0 K/uL   Monocytes Relative 5 %   Monocytes Absolute 0.6 0.1 - 1.0 K/uL   Eosinophils Relative 0 %   Eosinophils Absolute 0.0 0.0 - 0.5 K/uL   Basophils Relative 0 %   Basophils Absolute 0.0 0.0 - 0.1 K/uL  Comprehensive metabolic panel   Collection Time: 07/15/18  5:11 PM  Result Value Ref Range   Sodium 136 135 - 145 mmol/L   Potassium 3.9 3.5 - 5.1 mmol/L   Chloride 104 98 - 111 mmol/L   CO2 21 (L) 22 - 32 mmol/L   Glucose, Bld 72 70 - 99 mg/dL   BUN 6 6 - 20 mg/dL   Creatinine, Ser 9.14 0.44 - 1.00 mg/dL   Calcium 8.8 (L) 8.9 - 10.3 mg/dL   Total Protein 6.7 6.5 - 8.1 g/dL   Albumin 3.0 (L) 3.5 - 5.0 g/dL   AST 14 (L) 15 - 41 U/L   ALT 14 0 - 44 U/L   Alkaline Phosphatase 162 (H) 38 - 126 U/L   Total Bilirubin 0.5 0.3 - 1.2 mg/dL   GFR calc non Af Amer >60 >60 mL/min   GFR calc Af Amer >60 >60 mL/min   Anion gap 11 5 - 15  Type and screen [facility]   Collection Time: 07/15/18  5:11 PM  Result Value Ref Range   ABO/RH(D) A POS    Antibody Screen NEG    Sample Expiration 07/18/2018    Unit Number [id number]    Blood Component Type RED CELLS,LR    Unit division 00    Status of Unit ISSUED,FINAL    Transfusion Status OK TO TRANSFUSE    Crossmatch Result Compatible    Unit Number [id number]    Blood Component Type RED CELLS,LR    Unit division 00    Status of Unit ISSUED,FINAL    Transfusion Status OK TO TRANSFUSE    Crossmatch Result      Compatible Performed at [facility], [address] [place], [place] [address]   BPAM [facility]   Collection Time: 07/15/18  5:11 PM  Result Value Ref Range   ISSUE DATE / TIME  102725366440    Blood Product Unit Number [id number]    PRODUCT CODE I4332R51    Unit Type and Rh 6200    Blood Product Expiration Date [id number]    ISSUE DATE / TIME 010932355732    Blood Product Unit Number [id number]    PRODUCT CODE E8315V76    Unit Type and Rh 6200    Blood Product Expiration Date 160737106269   Urinalysis, Routine w reflex microscopic   Collection Time: 07/15/18  5:49 PM  Result Value Ref Range   Color, Urine AMBER (A) YELLOW   APPearance CLOUDY (A) CLEAR   Specific Gravity, Urine 1.024  1.005 - 1.030   pH 7.0 5.0 - 8.0   Glucose, UA NEGATIVE NEGATIVE mg/dL   Hgb urine dipstick NEGATIVE NEGATIVE   Bilirubin Urine NEGATIVE NEGATIVE   Ketones, ur 80 (A) NEGATIVE mg/dL   Protein, ur 161 (A) NEGATIVE mg/dL   Nitrite NEGATIVE NEGATIVE   Leukocytes, UA TRACE (A) NEGATIVE   RBC / HPF 0-5 0 - 5 RBC/hpf   WBC, UA 6-10 0 - 5 WBC/hpf   Bacteria, UA RARE (A) NONE SEEN   Squamous Epithelial / LPF 11-20 0 - 5   Mucus PRESENT   Protein / creatinine ratio, urine   Collection Time: 07/15/18  5:49 PM  Result Value Ref Range   Creatinine, Urine 320.00 mg/dL   Total Protein, Urine 47 mg/dL   Protein Creatinine Ratio 0.15 0.00 - 0.15 mg/mg[Cre]  Fern Test   Collection Time: 07/15/18  6:03 PM  Result Value Ref Range   POCT Fern Test Negative = intact amniotic membranes   Prepare RBC   Collection Time: 07/16/18 10:14 AM  Result Value Ref Range   Order Confirmation      ORDER PROCESSED BY BLOOD BANK Performed at [facility], [address], [place], [place] [address]   TSH   Collection Time: 07/16/18 10:27 AM  Result Value Ref Range   TSH 0.864 0.350 - 4.500 uIU/mL  Brain natriuretic peptide   Collection Time: 07/16/18 10:27 AM  Result Value Ref Range   B Natriuretic Peptide 28.0 0.0 - 100.0 pg/mL  CBC with Differential/Platelet   Collection Time: 07/16/18 10:27 AM  Result Value Ref Range   WBC 12.1 (H) 4.0 - 10.5 K/uL   RBC 3.62 (L) 3.87 - 5.11  MIL/uL   Hemoglobin 8.3 (L) 12.0 - 15.0 g/dL   HCT 54.0 (L) 98.1 - 19.1 %   MCV 75.1 (L) 80.0 - 100.0 fL   MCH 22.9 (L) 26.0 - 34.0 pg   MCHC 30.5 30.0 - 36.0 g/dL   RDW 47.8 29.5 - 62.1 %   Platelets 310 150 - 400 K/uL   nRBC 0.0 0.0 - 0.2 %   Neutrophils Relative % 74 %   Neutro Abs 9.0 (H) 1.7 - 7.7 K/uL   Lymphocytes Relative 21 %   Lymphs Abs 2.5 0.7 - 4.0 K/uL   Monocytes Relative 5 %   Monocytes Absolute 0.6 0.1 - 1.0 K/uL   Eosinophils Relative 0 %   Eosinophils Absolute 0.0 0.0 - 0.5 K/uL   Basophils Relative 0 %   Basophils Absolute 0.0 0.0 - 0.1 K/uL  Comprehensive metabolic panel   Collection Time: 07/16/18 10:27 AM  Result Value Ref Range   Sodium 132 (L) 135 - 145 mmol/L   Potassium 3.9 3.5 - 5.1 mmol/L   Chloride 104 98 - 111 mmol/L   CO2 19 (L) 22 - 32 mmol/L   Glucose, Bld 95 70 - 99 mg/dL   BUN <5 (L) 6 - 20 mg/dL   Creatinine, Ser 3.08 0.44 - 1.00 mg/dL   Calcium 8.3 (L) 8.9 - 10.3 mg/dL   Total Protein 6.3 (L) 6.5 - 8.1 g/dL   Albumin 2.7 (L) 3.5 - 5.0 g/dL   AST 13 (L) 15 - 41 U/L   ALT 12 0 - 44 U/L   Alkaline Phosphatase 137 (H) 38 - 126 U/L   Total Bilirubin 0.4 0.3 - 1.2 mg/dL   GFR calc non Af Amer >60 >60 mL/min   GFR calc Af Amer >60 >60 mL/min  Anion gap 9 5 - 15  OB Urine Culture   Collection Time: 07/16/18 12:27 PM  Result Value Ref Range   Specimen Description      OB CLEAN CATCH Performed at [facility], [address] [place], [place] [address]    Special Requests      NONE Performed at [facility], [address] [place], [place] [address]    Culture (A)     MULTIPLE SPECIES PRESENT, SUGGEST RECOLLECTION NO GROUP B STREP (S.AGALACTIAE) ISOLATED Performed at [facility], [address], [place], [place] [address]    Report Status 07/17/2018 FINAL   Rapid urine drug screen (hospital performed)   Collection Time: 07/16/18 12:27 PM  Result Value Ref Range   Opiates NONE DETECTED NONE DETECTED   Cocaine NONE  DETECTED NONE DETECTED   Benzodiazepines NONE DETECTED NONE DETECTED   Amphetamines NONE DETECTED NONE DETECTED   Tetrahydrocannabinol NONE DETECTED NONE DETECTED   Barbiturates NONE DETECTED NONE DETECTED  CBC   Collection Time: 07/16/18  5:31 PM  Result Value Ref Range   WBC 12.4 (H) 4.0 - 10.5 K/uL   RBC 3.82 (L) 3.87 - 5.11 MIL/uL   Hemoglobin 9.0 (L) 12.0 - 15.0 g/dL   HCT 91.4 (L) 78.2 - 95.6 %   MCV 76.4 (L) 80.0 - 100.0 fL   MCH 23.6 (L) 26.0 - 34.0 pg   MCHC 30.8 30.0 - 36.0 g/dL   RDW 21.3 (H) 08.6 - 57.8 %   Platelets 293 150 - 400 K/uL   nRBC 0.0 0.0 - 0.2 %  CBC with Differential/Platelet   Collection Time: 07/17/18  8:34 AM  Result Value Ref Range   WBC 12.4 (H) 4.0 - 10.5 K/uL   RBC 3.91 3.87 - 5.11 MIL/uL   Hemoglobin 9.1 (L) 12.0 - 15.0 g/dL   HCT 46.9 (L) 62.9 - 52.8 %   MCV 76.7 (L) 80.0 - 100.0 fL   MCH 23.3 (L) 26.0 - 34.0 pg   MCHC 30.3 30.0 - 36.0 g/dL   RDW 41.3 (H) 24.4 - 01.0 %   Platelets 304 150 - 400 K/uL   nRBC 0.0 0.0 - 0.2 %   Neutrophils Relative % 56 %   Neutro Abs 7.1 1.7 - 7.7 K/uL   Lymphocytes Relative 36 %   Lymphs Abs 4.4 (H) 0.7 - 4.0 K/uL   Monocytes Relative 7 %   Monocytes Absolute 0.8 0.1 - 1.0 K/uL   Eosinophils Relative 1 %   Eosinophils Absolute 0.1 0.0 - 0.5 K/uL   Basophils Relative 0 %   Basophils Absolute 0.0 0.0 - 0.1 K/uL  Comprehensive metabolic panel   Collection Time: 07/17/18  8:34 AM  Result Value Ref Range   Sodium 136 135 - 145 mmol/L   Potassium 3.7 3.5 - 5.1 mmol/L   Chloride 107 98 - 111 mmol/L   CO2 20 (L) 22 - 32 mmol/L   Glucose, Bld 105 (H) 70 - 99 mg/dL   BUN <5 (L) 6 - 20 mg/dL   Creatinine, Ser 2.72 0.44 - 1.00 mg/dL   Calcium 8.2 (L) 8.9 - 10.3 mg/dL   Total Protein 6.2 (L) 6.5 - 8.1 g/dL   Albumin 2.7 (L) 3.5 - 5.0 g/dL   AST 14 (L) 15 - 41 U/L   ALT 12 0 - 44 U/L   Alkaline Phosphatase 123 38 - 126 U/L   Total Bilirubin 0.3 0.3 - 1.2 mg/dL   GFR calc non Af Amer >  60 >60 mL/min   GFR  calc Af Amer >60 >60 mL/min   Anion gap 9 5 - 15   [place] Mfm Fetal Bpp Wo Non Stress  Result Date: 07/16/2018 ----------------------------------------------------------------------  OBSTETRICS REPORT                       (Signed Final 07/16/2018 10:32 am) ---------------------------------------------------------------------- Patient Info  ID #:       [id number]                          D.O.B.:  04-14-1996 (21 yrs)  Name:       Traci Castro                   Visit Date: 07/16/2018 10:16 am ---------------------------------------------------------------------- Performed By  Performed By:     Earley Brooke     Ref. Address:     [address]                                                             Road                                                             [address]                                                             [place]                                                             [address]  Attending:        Lin Landsman      Secondary Phy.:   3rd Nursing- HR                    MD                                                             OB                                                             3rd Floor  Referred By:      Center for             Location:         [facility]                    [facility] ---------------------------------------------------------------------- Orders   #  Description                          Code         Ordered By   1  [place] MFM FETAL BPP WO NON              76819.01     JACOB STINSON      STRESS  ----------------------------------------------------------------------   #  Order #                    Accession #                 Episode #   1  [id number]                  [id number]                  [id number]  ---------------------------------------------------------------------- Indications   Obesity complicating pregnancy, third          O99.213   trimester   [redacted] weeks gestation of  pregnancy                Z3A.35   Anemia during pregnancy in third trimester     O99.013   Uterine size-date discrepancy, third trimester O26.843   (S>D)   Abnormality fetal heart rate or rhythm, 3rd    O36.833   trimester (Known bradycardia)  ---------------------------------------------------------------------- Vital Signs                                                 Height:        5'3" ---------------------------------------------------------------------- Fetal Evaluation  Num Of Fetuses:         1  Fetal Heart Rate(bpm):  108  Cardiac Activity:       Bradycardia  Presentation:           Cephalic  Amniotic Fluid  AFI FV:      Within normal limits  AFI Sum(cm)     %Tile       Largest Pocket(cm)  18.79           70          6.82  RUQ(cm)       RLQ(cm)       LUQ(cm)        LLQ(cm)  6.82          3.87          4.72           3.38 ---------------------------------------------------------------------- Biophysical Evaluation  Amniotic F.V:   Within normal limits       F. Tone:        Observed  F. Movement:    Observed                   Score:  6/8  F. Breathing:   Not Observed ---------------------------------------------------------------------- OB History  Gravidity:    3         Term:   1  TOP:          1        Living:  1 ---------------------------------------------------------------------- Gestational Age  Best:          35w 3d     Det. By:  Previous Ultrasound      EDD:   08/17/18                                      (02/10/18) ---------------------------------------------------------------------- Impression  Biophysical profile 6/8 - (6/10- NRNST). ---------------------------------------------------------------------- Recommendations  Follow up as clinically indicated.  Consider repeat BPP in 4-6 hours given fetal gestational age  Clinical correlation recommended. ----------------------------------------------------------------------               Lin Landsman, MD Electronically Signed Final  Report   07/16/2018 10:32 am ----------------------------------------------------------------------  [place] Mfm Fetal Bpp Wo Non Stress  Result Date: 07/15/2018 ----------------------------------------------------------------------  OBSTETRICS REPORT                       (Signed Final 07/15/2018 05:05 pm) ---------------------------------------------------------------------- Patient Info  ID #:       [id number]                          D.O.B.:  10-Jul-1996 (21 yrs)  Name:       Traci Castro                   Visit Date: 07/15/2018 04:14 pm ---------------------------------------------------------------------- Performed By  Performed By:     Eden Lathe BS      Ref. Address:     [address]                    [place]                                                             [address]                                                             [address]                                                             [place]                                                             [address]  Attending:  Noralee Space MD        Location:         [facility]  Referred By:      Center for                    [facility]                    [facility] ---------------------------------------------------------------------- Orders   #  Description                          Code         Ordered By   1  [place] MFM OB FOLLOW UP                  [phone]     Steward Drone   2  [place] MFM FETAL BPP WO NON              76819.01     VERONICA ROGERS      STRESS  ----------------------------------------------------------------------   #  Order #                    Accession #                 Episode #   1  [id number]                  [id number]   2  [id number]                  [id number]                  [id number]  ---------------------------------------------------------------------- Indications   Obesity complicating pregnancy, third          O99.213   trimester   Anemia during pregnancy in third  trimester     O99.013   Uterine size-date discrepancy, third trimester O26.843   (S>D)   Abnormality fetal heart rate or rhythm, 3rd    O36.833   trimester   [redacted] weeks gestation of pregnancy                Z3A.35  ---------------------------------------------------------------------- Vital Signs                                                 Height:        5'3" ---------------------------------------------------------------------- Fetal Evaluation  Num Of Fetuses:         1  Fetal Heart Rate(bpm):  98  Cardiac Activity:       Bradycardia  Presentation:           Cephalic  Placenta:               Posterior  P. Cord Insertion:      Visualized  Amniotic Fluid  AFI FV:      Within normal limits  AFI Sum(cm)     %Tile       Largest Pocket(cm)  19.81           74          7.26  RUQ(cm)       RLQ(cm)  LUQ(cm)        LLQ(cm)  4.72          5.95          1.88           7.26 ---------------------------------------------------------------------- Biophysical Evaluation  Amniotic F.V:   Within normal limits       F. Tone:        Observed  F. Movement:    Observed                   Score:          6/8  F. Breathing:   Not Observed ---------------------------------------------------------------------- Biometry  BPD:      91.8  mm     G. Age:  37w 2d         94  %    CI:        76.71   %    70 - 86                                                          FL/HC:      18.2   %    20.1 - 22.3  HC:       332   mm     G. Age:  37w 6d         79  %    HC/AC:      1.03        0.93 - 1.11  AC:      322.9  mm     G. Age:  36w 1d         81  %    FL/BPD:     65.9   %    71 - 87  FL:       60.5  mm     G. Age:  31w 3d        < 3  %    FL/AC:      18.7   %    20 - 24  HUM:      59.7  mm     G. Age:  34w 5d         51  %  Est. FW:    2634  gm    5 lb 13 oz      62  % ---------------------------------------------------------------------- OB History  Gravidity:    3         Term:   1  TOP:          1        Living:  1  ---------------------------------------------------------------------- Gestational Age  U/S Today:     35w 5d                                        EDD:   08/14/18  Best:          35w 2d     Det. By:  Previous Ultrasound      EDD:   08/17/18                                      (  02/10/18) ---------------------------------------------------------------------- Anatomy  Cranium:               Appears normal         Aortic Arch:            Previously seen  Cavum:                 Appears normal         Ductal Arch:            Previously seen  Ventricles:            Appears normal         Diaphragm:              Appears normal  Choroid Plexus:        Appears normal         Stomach:                Appears normal, left                                                                        sided  Cerebellum:            Previously seen        Abdomen:                Appears normal  Posterior Fossa:       Previously seen        Abdominal Wall:         Previously seen  Nuchal Fold:           Previously seen        Cord Vessels:           Previously seen  Face:                  Appears normal         Kidneys:                Appear normal                         (orbits and profile)  Lips:                  Appears normal         Bladder:                Appears normal  Thoracic:              Appears normal         Spine:                  Previously seen  Heart:                 Appears normal         Upper Extremities:      Previously seen                         (4CH, axis, and situs  RVOT:                  Previously seen  Lower Extremities:      Previously seen  LVOT:                  Appears normal  Other:  Heels and 5th digit previously visualized. Nasal bone visualized. ---------------------------------------------------------------------- Cervix Uterus Adnexa  Cervix  Not visualized (advanced GA >24wks) ---------------------------------------------------------------------- Impression  Patient returned for fetal growth  assessment.  Amniotic fluid is normal and good fetal activity is seen. Fetal  growth is appropriate for gestational age. Fetal bradycardia  (98 bpm) was seen with good recovery. Antenatal testing was  performed because of bradycardia. Fetal breathing  movements did not meet the criteria. BPP 6/8.  Patient was sent over to MAU for NST. ---------------------------------------------------------------------- Recommendations  -An appointment was made for her to return next week for  BPP and NST. ----------------------------------------------------------------------                  Noralee Space, MD Electronically Signed Final Report   07/15/2018 05:05 pm ----------------------------------------------------------------------  [place] Mfm Ob Follow Up  Result Date: 07/15/2018 ----------------------------------------------------------------------  OBSTETRICS REPORT                       (Signed Final 07/15/2018 05:05 pm) ---------------------------------------------------------------------- Patient Info  ID #:       [id number]                          D.O.B.:  Feb 08, 1996 (21 yrs)  Name:       Traci Castro                   Visit Date: 07/15/2018 04:14 pm ---------------------------------------------------------------------- Performed By  Performed By:     Eden Lathe BS      Ref. Address:     [address]                    [place]                                                             [address]                                                             [address]                                                             [place]                                                             [address]  Attending:        Noralee Space MD  [facility]  Referred By:      Center for                    [facility]                    [facility] ---------------------------------------------------------------------- Orders   #  Description                          Code         Ordered By   1  [place]  MFM OB FOLLOW UP                  [phone]     Steward Drone   2  [place] MFM FETAL BPP WO NON              76819.01     VERONICA ROGERS      STRESS  ----------------------------------------------------------------------   #  Order #                    Accession #                 Episode #   1  [id number]                  [id number]   2  [id number]                  [id number]  ---------------------------------------------------------------------- Indications   Obesity complicating pregnancy, third          O99.213   trimester   Anemia during pregnancy in third trimester     O99.013   Uterine size-date discrepancy, third trimester O26.843   (S>D)   Abnormality fetal heart rate or rhythm, 3rd    O36.833   trimester   [redacted] weeks gestation of pregnancy                Z3A.35  ---------------------------------------------------------------------- Vital Signs                                                 Height:        5'3" ---------------------------------------------------------------------- Fetal Evaluation  Num Of Fetuses:         1  Fetal Heart Rate(bpm):  98  Cardiac Activity:       Bradycardia  Presentation:           Cephalic  Placenta:               Posterior  P. Cord Insertion:      Visualized  Amniotic Fluid  AFI FV:      Within normal limits  AFI Sum(cm)     %Tile       Largest Pocket(cm)  19.81           74          7.26  RUQ(cm)       RLQ(cm)       LUQ(cm)        LLQ(cm)  4.72          5.95          1.88           7.26 ---------------------------------------------------------------------- Biophysical Evaluation  Amniotic F.V:   Within normal limits       F. Tone:        Observed  F. Movement:    Observed                   Score:          6/8  F. Breathing:   Not Observed ---------------------------------------------------------------------- Biometry  BPD:      91.8  mm     G. Age:  37w 2d         94  %    CI:        76.71   %    70 - 86                                                           FL/HC:      18.2   %    20.1 - 22.3  HC:       332   mm     G. Age:  37w 6d         79  %    HC/AC:      1.03        0.93 - 1.11  AC:      322.9  mm     G. Age:  36w 1d         81  %    FL/BPD:     65.9   %    71 - 87  FL:       60.5  mm     G. Age:  31w 3d        < 3  %    FL/AC:      18.7   %    20 - 24  HUM:      59.7  mm     G. Age:  34w 5d         51  %  Est. FW:    2634  gm    5 lb 13 oz      62  % ---------------------------------------------------------------------- OB History  Gravidity:    3         Term:   1  TOP:          1        Living:  1 ---------------------------------------------------------------------- Gestational Age  U/S Today:     35w 5d                                        EDD:   08/14/18  Best:          35w 2d     Det. By:  Previous Ultrasound      EDD:   08/17/18                                      (02/10/18) ----------------------------------------------------------------------  Anatomy  Cranium:               Appears normal         Aortic Arch:            Previously seen  Cavum:                 Appears normal         Ductal Arch:            Previously seen  Ventricles:            Appears normal         Diaphragm:              Appears normal  Choroid Plexus:        Appears normal         Stomach:                Appears normal, left                                                                        sided  Cerebellum:            Previously seen        Abdomen:                Appears normal  Posterior Fossa:       Previously seen        Abdominal Wall:         Previously seen  Nuchal Fold:           Previously seen        Cord Vessels:           Previously seen  Face:                  Appears normal         Kidneys:                Appear normal                         (orbits and profile)  Lips:                  Appears normal         Bladder:                Appears normal  Thoracic:              Appears normal         Spine:                  Previously  seen  Heart:                 Appears normal         Upper Extremities:      Previously seen                         (4CH, axis, and situs  RVOT:                  Previously seen  Lower Extremities:      Previously seen  LVOT:                  Appears normal  Other:  Heels and 5th digit previously visualized. Nasal bone visualized. ---------------------------------------------------------------------- Cervix Uterus Adnexa  Cervix  Not visualized (advanced GA >24wks) ---------------------------------------------------------------------- Impression  Patient returned for fetal growth assessment.  Amniotic fluid is normal and good fetal activity is seen. Fetal  growth is appropriate for gestational age. Fetal bradycardia  (98 bpm) was seen with good recovery. Antenatal testing was  performed because of bradycardia. Fetal breathing  movements did not meet the criteria. BPP 6/8.  Patient was sent over to MAU for NST. ---------------------------------------------------------------------- Recommendations  -An appointment was made for her to return next week for  BPP and NST. ----------------------------------------------------------------------                  Noralee Space, MD Electronically Signed Final Report   07/15/2018 05:05 pm ----------------------------------------------------------------------   Future Appointments  Date Time Provider Department Center  07/21/2018  2:45 PM Leftwich-Kirby, Wilmer Floor, CNM [facility] None  07/22/2018  3:30 PM [facility] [place] 5 [facility]    Discharge Condition: Stable  Discharge disposition: 01-Home or Self Care        Allergies as of 07/17/2018      Reactions   Chlorhexidine Itching, Rash   Severe itching, redness.    Tramadol Nausea And Vomiting      Medication List    STOP taking these medications   cephALEXin 500 MG capsule Commonly known as:  KEFLEX   cyclobenzaprine 10 MG tablet Commonly known as:  FLEXERIL   Doxylamine-Pyridoxine 10-10 MG  Tbec   ondansetron 4 MG disintegrating tablet Commonly known as:  ZOFRAN-ODT     TAKE these medications   acetaminophen 500 MG tablet Commonly known as:  TYLENOL Take 500 mg by mouth every 6 (six) hours as needed.   COMFORT FIT MATERNITY SUPP LG Misc 1 application by Does not apply route daily.   docusate sodium 100 MG capsule Commonly known as:  COLACE Take 1 capsule (100 mg total) by mouth 2 (two) times daily as needed for mild constipation or moderate constipation.   famotidine 20 MG tablet Commonly known as:  PEPCID Take 1 tablet (20 mg total) by mouth 2 (two) times daily.   ferrous sulfate 325 (65 FE) MG tablet Take 1 tablet (325 mg total) by mouth 2 (two) times daily.   VITAFOL GUMMIES 3.33-0.333-34.8 MG Chew Chew 3 tablets by mouth at bedtime.        Signed: Jaynie Collins M.D. 07/17/2018, 3:28 PM

## 2018-07-21 ENCOUNTER — Ambulatory Visit (INDEPENDENT_AMBULATORY_CARE_PROVIDER_SITE_OTHER): Payer: 59 | Admitting: Advanced Practice Midwife

## 2018-07-21 ENCOUNTER — Other Ambulatory Visit (HOSPITAL_COMMUNITY)
Admission: RE | Admit: 2018-07-21 | Discharge: 2018-07-21 | Disposition: A | Discharge status: Home / Self Care | Payer: 59 | Source: Ambulatory Visit | Attending: Advanced Practice Midwife | Admitting: Advanced Practice Midwife

## 2018-07-21 VITALS — BP 116/77 | HR 101 | Wt 284.5 lb

## 2018-07-21 DIAGNOSIS — N949 Unspecified condition associated with female genital organs and menstrual cycle: Secondary | ICD-10-CM

## 2018-07-21 DIAGNOSIS — Z3480 Encounter for supervision of other normal pregnancy, unspecified trimester: Secondary | ICD-10-CM | POA: Insufficient documentation

## 2018-07-21 DIAGNOSIS — O26893 Other specified pregnancy related conditions, third trimester: Secondary | ICD-10-CM

## 2018-07-21 DIAGNOSIS — Z3A36 36 weeks gestation of pregnancy: Secondary | ICD-10-CM

## 2018-07-21 DIAGNOSIS — O2343 Unspecified infection of urinary tract in pregnancy, third trimester: Secondary | ICD-10-CM

## 2018-07-21 DIAGNOSIS — O2342 Unspecified infection of urinary tract in pregnancy, second trimester: Secondary | ICD-10-CM

## 2018-07-21 DIAGNOSIS — O99013 Anemia complicating pregnancy, third trimester: Secondary | ICD-10-CM

## 2018-07-21 LAB — OB RESULTS CONSOLE GC/CHLAMYDIA: Gonorrhea: NEGATIVE

## 2018-07-21 NOTE — Progress Notes (Signed)
   PRENATAL VISIT NOTE  Subjective:  Traci Castro is a 22 y.o. G3P1011 at 6720w1d being seen today for ongoing prenatal care.  She is currently monitored for the following issues for this low-risk pregnancy and has BMI 45.0-49.9, adult (HCC); [name], antepartum; [name], antepartum; [name]; UTI (urinary tract infection) during pregnancy, second trimester; Fetal bradycardia, antepartum condition or complication; Hypotension, unspecified; Maternal morbid obesity, antepartum (HCC); and Anemia in pregnancy, third trimester on their problem list.  Patient reports fatigue, pelvic pain, and intermittent cramping/contractions.  Contractions: Irregular. Vag. Bleeding: None.  Movement: Present. Denies leaking of fluid.   The following portions of the patient's history were reviewed and updated as appropriate: allergies, current medications, past family history, past medical history, past social history, past surgical history and problem list. Problem list updated.  Objective:   Vitals:   07/21/18 1513  BP: 116/77  Pulse: (!) 101  Weight: 129 kg    Fetal Status: Fetal Heart Rate (bpm): 134 Fundal Height: 39 cm Movement: Present  Presentation: Vertex  General:  Alert, oriented and cooperative. Patient is in no acute distress.  Skin: Skin is warm and dry. No rash noted.   Cardiovascular: Normal heart rate noted  Respiratory: Normal respiratory effort, no problems with respiration noted  Abdomen: Soft, gravid, appropriate for gestational age.  Pain/Pressure: Present     Pelvic: Cervical exam performed Dilation: 2 Effacement (%): Thick Station: -3  Extremities: Normal range of motion.  Edema: Trace  Mental Status: Normal mood and affect. Normal behavior. Normal judgment and thought content.   Assessment and Plan:  Pregnancy: G3P1011 at 2620w1d  1. [name], antepartum --Anticipatory guidance about next  visits/weeks of pregnancy given. - Cervicovaginal ancillary only( [place])  2. UTI in pregnancy, antepartum, second trimester --Not taking suppressive therapy.  3. Pelvic pressure in pregnancy, antepartum, third trimester --No evidence of labor today, 2/long/posterior, vertex --Rest/ice/heat/warm bath/Tylenol/pregnancy support belt  4. Anemia affecting pregnancy in third trimester --Admission to [facility] Unit 07/15/18 for fetal bradycardia and maternal bradycardia and dizziness. Hgb 8.3 so 2 U PRBCs given and pt and fetal HR stabilized.  --Pt continues to report fatigue but no more dizziness or near syncope.   Preterm labor symptoms and general obstetric precautions including but not limited to vaginal bleeding, contractions, leaking of fluid and fetal movement were reviewed in detail with the patient. Please refer to After Visit Summary for other counseling recommendations.  Return in about 1 week (around 07/28/2018).  Future Appointments  Date Time Provider Department Center  07/22/2018  3:30 PM [facility] [place] 5 [facility]  07/30/2018  1:00 PM Constant, Gigi GinPeggy, MD [facility] None    Sharen CounterLisa Leftwich-Kirby, CNM

## 2018-07-21 NOTE — Patient Instructions (Signed)

## 2018-07-21 NOTE — Progress Notes (Signed)
Patient reports fetal movement with pressure and irregular contractions, denies bleeding.

## 2018-07-22 ENCOUNTER — Inpatient Hospital Stay (HOSPITAL_COMMUNITY)
Admission: AD | Admit: 2018-07-22 | Discharge: 2018-07-25 | DRG: 807 | Disposition: A | Discharge status: Home / Self Care | Payer: 59 | Attending: Family Medicine | Admitting: Family Medicine

## 2018-07-22 ENCOUNTER — Other Ambulatory Visit: Payer: Self-pay

## 2018-07-22 ENCOUNTER — Ambulatory Visit (HOSPITAL_BASED_OUTPATIENT_CLINIC_OR_DEPARTMENT_OTHER)
Admission: RE | Admit: 2018-07-22 | Discharge: 2018-07-22 | Disposition: A | Discharge status: Home / Self Care | Payer: 59 | Source: Ambulatory Visit | Attending: Obstetrics & Gynecology | Admitting: Obstetrics & Gynecology

## 2018-07-22 ENCOUNTER — Encounter (HOSPITAL_COMMUNITY): Payer: Self-pay

## 2018-07-22 ENCOUNTER — Encounter (HOSPITAL_COMMUNITY): Payer: Self-pay | Admitting: *Deleted

## 2018-07-22 ENCOUNTER — Other Ambulatory Visit (HOSPITAL_COMMUNITY): Payer: Self-pay | Admitting: Obstetrics and Gynecology

## 2018-07-22 DIAGNOSIS — O26843 Uterine size-date discrepancy, third trimester: Secondary | ICD-10-CM

## 2018-07-22 DIAGNOSIS — O368330 Maternal care for abnormalities of the fetal heart rate or rhythm, third trimester, not applicable or unspecified: Secondary | ICD-10-CM | POA: Insufficient documentation

## 2018-07-22 DIAGNOSIS — Z362 Encounter for other antenatal screening follow-up: Secondary | ICD-10-CM | POA: Insufficient documentation

## 2018-07-22 DIAGNOSIS — Z3480 Encounter for supervision of other normal pregnancy, unspecified trimester: Secondary | ICD-10-CM

## 2018-07-22 DIAGNOSIS — D649 Anemia, unspecified: Secondary | ICD-10-CM

## 2018-07-22 DIAGNOSIS — Z3A36 36 weeks gestation of pregnancy: Secondary | ICD-10-CM

## 2018-07-22 DIAGNOSIS — O368130 Decreased fetal movements, third trimester, not applicable or unspecified: Secondary | ICD-10-CM | POA: Insufficient documentation

## 2018-07-22 DIAGNOSIS — O99013 Anemia complicating pregnancy, third trimester: Secondary | ICD-10-CM

## 2018-07-22 DIAGNOSIS — O99214 Obesity complicating childbirth: Secondary | ICD-10-CM | POA: Diagnosis present

## 2018-07-22 DIAGNOSIS — O2342 Unspecified infection of urinary tract in pregnancy, second trimester: Secondary | ICD-10-CM

## 2018-07-22 DIAGNOSIS — E669 Obesity, unspecified: Secondary | ICD-10-CM | POA: Insufficient documentation

## 2018-07-22 DIAGNOSIS — O9902 Anemia complicating childbirth: Secondary | ICD-10-CM | POA: Diagnosis present

## 2018-07-22 DIAGNOSIS — Z6841 Body Mass Index (BMI) 40.0 and over, adult: Secondary | ICD-10-CM

## 2018-07-22 DIAGNOSIS — Z3490 Encounter for supervision of normal pregnancy, unspecified, unspecified trimester: Secondary | ICD-10-CM | POA: Diagnosis present

## 2018-07-22 DIAGNOSIS — O99213 Obesity complicating pregnancy, third trimester: Secondary | ICD-10-CM

## 2018-07-22 DIAGNOSIS — O09899 Supervision of other high risk pregnancies, unspecified trimester: Secondary | ICD-10-CM

## 2018-07-22 DIAGNOSIS — Z2839 Other underimmunization status: Secondary | ICD-10-CM

## 2018-07-22 DIAGNOSIS — O99824 Streptococcus B carrier state complicating childbirth: Secondary | ICD-10-CM | POA: Diagnosis present

## 2018-07-22 DIAGNOSIS — Z3483 Encounter for supervision of other normal pregnancy, third trimester: Secondary | ICD-10-CM | POA: Diagnosis present

## 2018-07-22 DIAGNOSIS — Z283 Underimmunization status: Secondary | ICD-10-CM

## 2018-07-22 DIAGNOSIS — O9989 Other specified diseases and conditions complicating pregnancy, childbirth and the puerperium: Secondary | ICD-10-CM

## 2018-07-22 DIAGNOSIS — O99210 Obesity complicating pregnancy, unspecified trimester: Secondary | ICD-10-CM | POA: Diagnosis present

## 2018-07-22 LAB — CBC
HCT: 36.4 % (ref 36.0–46.0)
Hemoglobin: 11.1 g/dL — ABNORMAL LOW (ref 12.0–15.0)
MCH: 23.9 pg — ABNORMAL LOW (ref 26.0–34.0)
MCHC: 30.5 g/dL (ref 30.0–36.0)
MCV: 78.4 fL — ABNORMAL LOW (ref 80.0–100.0)
PLATELETS: 350 10*3/uL (ref 150–400)
RBC: 4.64 MIL/uL (ref 3.87–5.11)
RDW: 17.5 % — AB (ref 11.5–15.5)
WBC: 14.2 10*3/uL — ABNORMAL HIGH (ref 4.0–10.5)
nRBC: 0 % (ref 0.0–0.2)

## 2018-07-22 LAB — TYPE AND SCREEN
ABO/RH(D): A POS
Antibody Screen: NEGATIVE

## 2018-07-22 LAB — CERVICOVAGINAL ANCILLARY ONLY
CHLAMYDIA, DNA PROBE: NEGATIVE
NEISSERIA GONORRHEA: NEGATIVE

## 2018-07-22 MED ORDER — SOD CITRATE-CITRIC ACID 500-334 MG/5ML PO SOLN: 30.0000 mL | ORAL | Status: DC | PRN

## 2018-07-22 MED ORDER — ONDANSETRON HCL 4 MG/2ML IJ SOLN
4.0000 mg | Freq: Four times a day (QID) | INTRAMUSCULAR | Status: DC | PRN
  Administered 2018-07-22 – 2018-07-23 (×2): 4 mg via INTRAVENOUS
  Filled 2018-07-22 (×2): qty 2

## 2018-07-22 MED ORDER — SODIUM CHLORIDE 0.9 % IV SOLN
5.0000 10*6.[IU] | Freq: Once | INTRAVENOUS | Status: AC
  Administered 2018-07-22: 5 10*6.[IU] via INTRAVENOUS
  Filled 2018-07-22: qty 5

## 2018-07-22 MED ORDER — OXYTOCIN 40 UNITS IN LACTATED RINGERS INFUSION - SIMPLE MED
2.5000 [IU]/h | INTRAVENOUS | Status: DC
  Administered 2018-07-23: 2.5 [IU]/h via INTRAVENOUS

## 2018-07-22 MED ORDER — FLEET ENEMA 7-19 GM/118ML RE ENEM: 1.0000 | ENEMA | RECTAL | Status: DC | PRN

## 2018-07-22 MED ORDER — LACTATED RINGERS IV SOLN
INTRAVENOUS | Status: DC
  Administered 2018-07-22 – 2018-07-23 (×3): via INTRAVENOUS

## 2018-07-22 MED ORDER — OXYTOCIN BOLUS FROM INFUSION
500.0000 mL | Freq: Once | INTRAVENOUS | Status: AC
  Administered 2018-07-23: 500 mL via INTRAVENOUS

## 2018-07-22 MED ORDER — LIDOCAINE HCL (PF) 1 % IJ SOLN
30.0000 mL | INTRAMUSCULAR | Status: DC | PRN
  Filled 2018-07-22: qty 30

## 2018-07-22 MED ORDER — MISOPROSTOL 25 MCG QUARTER TABLET
25.0000 ug | ORAL_TABLET | ORAL | Status: DC | PRN
  Administered 2018-07-22 – 2018-07-23 (×3): 25 ug via VAGINAL
  Filled 2018-07-22 (×5): qty 1

## 2018-07-22 MED ORDER — LACTATED RINGERS IV SOLN: 500.0000 mL | INTRAVENOUS | Status: DC | PRN

## 2018-07-22 MED ORDER — TERBUTALINE SULFATE 1 MG/ML IJ SOLN
0.2500 mg | Freq: Once | INTRAMUSCULAR | Status: DC | PRN
  Filled 2018-07-22: qty 1

## 2018-07-22 MED ORDER — PENICILLIN G 3 MILLION UNITS IVPB - SIMPLE MED
3.0000 10*6.[IU] | INTRAVENOUS | Status: DC
  Administered 2018-07-22 – 2018-07-23 (×4): 3 10*6.[IU] via INTRAVENOUS
  Filled 2018-07-22 (×6): qty 100

## 2018-07-22 MED ORDER — ACETAMINOPHEN 325 MG PO TABS: 650.0000 mg | ORAL_TABLET | ORAL | Status: DC | PRN

## 2018-07-22 NOTE — H&P (Signed)
OBSTETRIC ADMISSION HISTORY AND PHYSICAL  Traci Castro is a 22 y.o. female G3P1011 with IUP at 6241w2d by early ultrasoun presenting for IOL for 4/8 BPP. She reports +FMs, No LOF, no VB, no blurry vision, headaches or peripheral edema, and RUQ pain.  She plans on breast feeding. She is undecided for birth control. [facility]  Dating: By early ultrasound --->  Estimated Date of Delivery: 08/17/18   Nursing Staff Provider  Office Location  [facility] Dating   US  Language  ENGLISH Anatomy US   normal  Flu Vaccine  05-21-18 Genetic Screen  NIPS:Panorama: Neg   AFP:  Neg  TDaP vaccine   05-21-18 Hgb A1C or  GTT Early  Third trimester normal  Rhogam  n/a   LAB RESULTS   Feeding Plan  breastfeed Blood Type --/--/A POS (11/19 1711)   Contraception  undecided Antibody NEG (11/19 1711)  Circumcision  yes  Rubella 0.97 (06/04 1521)  Pediatrician   [facility] RPR Non Reactive (09/25 1135)   Support Person  FOB-[facility] HBsAg Negative (06/04 1521)   Prenatal Classes  yes HIV Non Reactive (09/25 1135)  BTL Consent  GBS     VBAC Consent  Pap 01/28/18: normal    Hgb Electro  Normal    CF Neg    SMA/Fragile X Neg    Waterbirth  [ ]  Class [ ]  Consent [ ]  CNM visit   Prenatal History/Complications:  Past Medical History: Past Medical History:  Diagnosis Date  . Anemia   . Anxiety    no meds  . Bipolar disorder (HCC)   . Depression    no meds  . Gastritis    2 bouts  . H/O seasonal allergies   . Migraine    migraines  . Psoriasis   . SVD (spontaneous vaginal delivery)    x 1  . Vision abnormalities    wears glasses    Past Surgical History: Past Surgical History:  Procedure Laterality Date  . DILATION AND CURETTAGE OF UTERUS    . DILATION AND EVACUATION N/A 09/20/2017   Procedure: SUCTION DILATATION AND EVACUATION;  Surgeon: Carrizo Hill BingPickens, Charlie, MD;  Location: [facility];  Service: Gynecology;  Laterality: N/A;  . ESOPHAGOGASTRODUODENOSCOPY N/A 10/17/2012   Procedure: ESOPHAGOGASTRODUODENOSCOPY (EGD);  Surgeon: Jon GillsJoseph H Clark, MD;  [facility];  Service: Gastroenterology;  Laterality: N/A;  . THERAPEUTIC ABORTION  09/16/2017   Clinic  . WISDOM TOOTH EXTRACTION      Obstetrical History: OB History    Gravida  3   Para  1   Term  1   Preterm      AB  1   Living  1     SAB      TAB  1   Ectopic      Multiple  0   Live Births  1           Social History: Social History   Socioeconomic History  . Marital status: Single    Spouse name: Not on file  . Number of children: Not on file  . Years of education: Not on file  . Highest education level: Not on file  Occupational History  . Not on file  Social Needs  . Financial resource strain: Not on file  . Food insecurity:    Worry: Not on file    Inability: Not on file  . Transportation needs:    Medical: Not on file  Non-medical: Not on file  Tobacco Use  . Smoking status: Never Smoker  . Smokeless tobacco: Never Used  Substance and Sexual Activity  . Alcohol use: No  . Drug use: No  . Sexual activity: Yes    Birth control/protection: None  Lifestyle  . Physical activity:    Days per week: Not on file    Minutes per session: Not on file  . Stress: Not on file  Relationships  . Social connections:    Talks on phone: Not on file    Gets together: Not on file    Attends religious service: Not on file    Active member of club or organization: Not on file    Attends meetings of clubs or organizations: Not on file    Relationship status: Not on file  Other Topics Concern  . Not on file  Social History Narrative   10th grade    Family History: Family History  Problem Relation Age of Onset  . Cholelithiasis Mother   . Hyperlipidemia Mother   . Hypertension Mother   . Mental illness Mother   . Cholelithiasis Maternal Uncle   . Hyperlipidemia Maternal Uncle   . Hypertension Maternal Uncle   . Cholelithiasis Maternal Grandmother   . Arthritis  Maternal Grandmother   . COPD Maternal Grandmother   . Depression Maternal Grandmother   . Diabetes Maternal Grandmother   . Heart disease Maternal Grandmother   . Hyperlipidemia Maternal Grandmother   . Hypertension Maternal Grandmother   . Stroke Maternal Grandmother   . Learning disabilities Paternal Grandmother   . Mental illness Paternal Grandmother   . Cancer Paternal Grandmother   . Hyperlipidemia Paternal Grandfather   . Diabetes Paternal Grandfather   . Depression Father   . Hypertension Father   . Ulcers Neg Hx   . Alcohol abuse Neg Hx   . Asthma Neg Hx   . Birth defects Neg Hx   . Drug abuse Neg Hx   . Early death Neg Hx   . Hearing loss Neg Hx   . Kidney disease Neg Hx   . Mental retardation Neg Hx   . Miscarriages / Stillbirths Neg Hx   . Vision loss Neg Hx     Allergies: Allergies  Allergen Reactions  . Chlorhexidine Itching and Rash    Severe itching, redness.   . Tramadol Nausea And Vomiting    Medications Prior to Admission  Medication Sig Dispense Refill Last Dose  . acetaminophen (TYLENOL) 500 MG tablet Take 500 mg by mouth every 6 (six) hours as needed.   Taking  . docusate sodium (COLACE) 100 MG capsule Take 1 capsule (100 mg total) by mouth 2 (two) times daily as needed for mild constipation or moderate constipation. 30 capsule 2 Taking  . Elastic Bandages & Supports (COMFORT FIT MATERNITY SUPP LG) MISC 1 application by Does not apply route daily. 1 each 0 Taking  . famotidine (PEPCID) 20 MG tablet Take 1 tablet (20 mg total) by mouth 2 (two) times daily. (Patient not taking: Reported on 07/16/2018) 60 tablet 0 Not Taking  . ferrous sulfate (FERROUSUL) 325 (65 FE) MG tablet Take 1 tablet (325 mg total) by mouth 2 (two) times daily. 60 tablet 1 Taking  . Prenatal Vit-Fe Phos-FA-Omega (VITAFOL GUMMIES) 3.33-0.333-34.8 MG CHEW Chew 3 tablets by mouth at bedtime. 90 tablet 12 Taking   Review of Systems   All systems reviewed and negative except as  stated in HPI  Blood pressure 129/75, pulse  98, temperature 98.1 F (36.7 C), temperature source Oral, resp. rate 18, last menstrual period 10/19/2017. General appearance: alert, cooperative and no distress Lungs: clear to auscultation bilaterally Heart: regular rate and rhythm Abdomen: soft, non-tender; bowel sounds normal Pelvic: n/a Extremities: Homans sign is negative, no sign of DVT DTR's +2 Presentation: cephalic Fetal monitoringBaseline: 110 bpm, Variability: Good {> 6 bpm), Accelerations: Reactive and Decelerations: one prolonged Uterine activity: occasional uc's     Prenatal labs: ABO, Rh: --/--/A POS (11/19 1711) Antibody: NEG (11/19 1711) Rubella: 0.97 (06/04 1521) RPR: Non Reactive (09/25 1135)  HBsAg: Negative (06/04 1521)  HIV: Non Reactive (09/25 1135)  GBS:     Prenatal Transfer Tool  Maternal Diabetes: No Genetic Screening: Normal Maternal Ultrasounds/Referrals: Normal Fetal Ultrasounds or other Referrals:  None Maternal Substance Abuse:  No Significant Maternal Medications:  None Significant Maternal Lab Results: None  No results found for this or any previous visit (from the past 24 hour(s)).  Patient Active Problem List   Diagnosis Date Noted  . Hypotension, unspecified 07/16/2018  . Maternal morbid obesity, antepartum (HCC) 07/16/2018  . Anemia in pregnancy, third trimester 07/16/2018  . Fetal bradycardia, antepartum condition or complication 07/15/2018  . UTI (urinary tract infection) during pregnancy, second trimester 04/17/2018  . [name], antepartum 01/31/2018  . [name] 01/31/2018  . [name], antepartum 01/27/2018  . BMI 45.0-49.9, adult (HCC) 03/15/2016    Assessment/Plan:  Irean Kendricks is a 22 y.o. G3P1011 at [redacted]w[redacted]d here for IOL for BPP 4/8  #Labor: Attempt IOL with cytotec and foley bulb when able #Pain: Per patient request #FWB: Cat 1 #ID:  GBS unknown but preterm so will  treat prophylactically #MOF: Breast #MOC: undecided #Circ:  Yes  Rolm Bookbinder, CNM  07/22/2018, 5:30 PM

## 2018-07-22 NOTE — MAU Note (Signed)
Pt sent from MFM for BPP 4/8, has been having uc's for the last 2 days, was seen by MD office yesterday, denies bleeding or LOF.  Reports good fetal movement.

## 2018-07-22 NOTE — MAU Note (Signed)
Unsuccessful IV attempts x 2, R forearm & R antecubital.

## 2018-07-22 NOTE — MAU Note (Signed)
Pt here today in MFC.  Report called to MAU per Dr. Grace BushyBooker for further evaluation.  Pt/FOB transported via wheelchair to MAU.

## 2018-07-22 NOTE — Progress Notes (Signed)
OB/GYN Faculty Practice: Labor Progress Note  Subjective: Doing well, in room with husband Traci Castro. Tired, hasn't been sleeping well all week.   Objective: BP 122/69   Pulse 85   Temp 97.9 F (36.6 C) (Oral)   Resp 16   Ht 5\' 3"  (1.6 m)   Wt 126.3 kg   LMP 10/19/2017   SpO2 100%   BMI 49.32 kg/m  Gen: well-appearing, NAD Dilation: 1.5 Effacement (%): 50 Cervical Position: Middle Station: -3 Presentation: Vertex Exam by:: Cher NakaiKristin McLeod RNC  Assessment and Plan: 22 y.o. [id number] 5036w2d here for IOL for BPP 4/8 per MFM recommendations.   Labor: Induction started with cytotec at 1645.  -- plan to recheck and try to place FB at next check when next cytotec due -- pain control: open to epidural  -- PPH Risk: low  Fetal Well-Being: Known fetal bradycardia developed in 3rd trimester, was having weekly BPPs after noted on follow-up growth U/S for size > dates. Worsened with maternal hypotension - recent transfusion. EFW 2634g (62%) at 35w2. Cephalic by prior checks.  -- Category I - continuous fetal monitoring  -- GBS positive - PCN   -- s/p BMZ 11/19,21    Traci Castro S. Earlene PlaterWallace, DO OB/GYN Fellow, Faculty Practice  10:05 PM

## 2018-07-23 ENCOUNTER — Encounter (HOSPITAL_COMMUNITY): Payer: Self-pay

## 2018-07-23 DIAGNOSIS — Z3A36 36 weeks gestation of pregnancy: Secondary | ICD-10-CM

## 2018-07-23 LAB — RPR: RPR Ser Ql: NONREACTIVE

## 2018-07-23 MED ORDER — DIBUCAINE 1 % RE OINT: 1.0000 "application " | TOPICAL_OINTMENT | RECTAL | Status: DC | PRN

## 2018-07-23 MED ORDER — BENZOCAINE-MENTHOL 20-0.5 % EX AERO: 1.0000 "application " | INHALATION_SPRAY | CUTANEOUS | Status: DC | PRN

## 2018-07-23 MED ORDER — IBUPROFEN 600 MG PO TABS
600.0000 mg | ORAL_TABLET | Freq: Four times a day (QID) | ORAL | Status: DC
  Administered 2018-07-23 – 2018-07-25 (×7): 600 mg via ORAL
  Filled 2018-07-23 (×7): qty 1

## 2018-07-23 MED ORDER — ZOLPIDEM TARTRATE 5 MG PO TABS: 5.0000 mg | ORAL_TABLET | Freq: Every evening | ORAL | Status: DC | PRN

## 2018-07-23 MED ORDER — ONDANSETRON HCL 4 MG/2ML IJ SOLN: 4.0000 mg | INTRAMUSCULAR | Status: DC | PRN

## 2018-07-23 MED ORDER — ONDANSETRON HCL 4 MG PO TABS: 4.0000 mg | ORAL_TABLET | ORAL | Status: DC | PRN

## 2018-07-23 MED ORDER — DIPHENHYDRAMINE HCL 25 MG PO CAPS: 25.0000 mg | ORAL_CAPSULE | Freq: Four times a day (QID) | ORAL | Status: DC | PRN

## 2018-07-23 MED ORDER — OXYTOCIN 40 UNITS IN LACTATED RINGERS INFUSION - SIMPLE MED
1.0000 m[IU]/min | INTRAVENOUS | Status: DC
  Administered 2018-07-23: 2 m[IU]/min via INTRAVENOUS
  Filled 2018-07-23: qty 1000

## 2018-07-23 MED ORDER — ACETAMINOPHEN 325 MG PO TABS
650.0000 mg | ORAL_TABLET | ORAL | Status: DC | PRN
  Administered 2018-07-24 (×2): 650 mg via ORAL
  Filled 2018-07-23 (×2): qty 2

## 2018-07-23 MED ORDER — WITCH HAZEL-GLYCERIN EX PADS: 1.0000 "application " | MEDICATED_PAD | CUTANEOUS | Status: DC | PRN

## 2018-07-23 MED ORDER — SIMETHICONE 80 MG PO CHEW: 80.0000 mg | CHEWABLE_TABLET | ORAL | Status: DC | PRN

## 2018-07-23 MED ORDER — TETANUS-DIPHTH-ACELL PERTUSSIS 5-2.5-18.5 LF-MCG/0.5 IM SUSP: 0.5000 mL | Freq: Once | INTRAMUSCULAR | Status: DC

## 2018-07-23 MED ORDER — PRENATAL MULTIVITAMIN CH
1.0000 | ORAL_TABLET | Freq: Every day | ORAL | Status: DC
  Administered 2018-07-24 – 2018-07-25 (×2): 1 via ORAL
  Filled 2018-07-23 (×2): qty 1

## 2018-07-23 MED ORDER — SENNOSIDES-DOCUSATE SODIUM 8.6-50 MG PO TABS
2.0000 | ORAL_TABLET | ORAL | Status: DC
  Administered 2018-07-23 – 2018-07-25 (×2): 2 via ORAL
  Filled 2018-07-23 (×2): qty 2

## 2018-07-23 MED ORDER — FENTANYL CITRATE (PF) 100 MCG/2ML IJ SOLN
100.0000 ug | INTRAMUSCULAR | Status: DC | PRN
  Administered 2018-07-23: 100 ug via INTRAVENOUS
  Filled 2018-07-23: qty 2

## 2018-07-23 MED ORDER — COCONUT OIL OIL
1.0000 "application " | TOPICAL_OIL | Status: DC | PRN
  Filled 2018-07-23 (×2): qty 120

## 2018-07-23 NOTE — Progress Notes (Signed)
OB/GYN Faculty Practice: Labor Progress Note  Subjective: Has decided okay with FB. Would prefer not to have cytotec, wants to try pitocin instead.   Objective: BP 126/67   Pulse 82   Temp 98.3 F (36.8 C) (Oral)   Resp 20   Ht 5\' 3"  (1.6 m)   Wt 126.3 kg   LMP 10/19/2017   SpO2 100%   BMI 49.32 kg/m   Gen: tired appearing, NAD Dilation: 2 Effacement (%): 60 Cervical Position: Middle Station: -3 Presentation: Vertex Exam by:: K Faucett RN  Assessment and Plan: 22 y.o. [id number] 696w2d here for IOL for BPP 4/8 per MFM recommendations.   Labor: Induction started with cytotec at 1645. Cytotec x 3 and cervix now 2/thick/ballotable -- cytotec x 3  -- FB placed - patient preference to switch to pitocin -- pain control: prefers to go natural, doula in room  -- PPH Risk: low  Fetal Well-Being: Known fetal bradycardia developed in 3rd trimester, was having weekly BPPs after noted on follow-up growth U/S for size > dates. Worsened with maternal hypotension - recent transfusion. EFW 2634g (62%) at 35w2. Cephalic by prior checks.  -- Category I - continuous fetal monitoring  -- GBS positive - PCN   -- s/p BMZ 11/19,21    Traci Brazee S. Earlene PlaterWallace, DO OB/GYN Fellow, Faculty Practice  7:22 AM

## 2018-07-23 NOTE — Progress Notes (Signed)
LABOR PROGRESS NOTE  Traci BradfordKazual Castro is a 22 y.o. G3P1011 at 60106w3d  admitted for IOL for BPP 4/8 per MFM recommendations.   Subjective: Feeling very uncomfortable with contractions, breathing through them. FOB and doula at bedside.   Objective: BP 121/80   Pulse 82   Temp 98.2 F (36.8 C) (Oral)   Resp 18   Ht 5\' 3"  (1.6 m)   Wt 126.3 kg   LMP 10/19/2017   SpO2 100%   BMI 49.32 kg/m  or  Vitals:   07/23/18 0501 07/23/18 0701 07/23/18 0742 07/23/18 0855  BP: 113/68 126/67 (!) 108/59 121/80  Pulse: 76 82 83 82  Resp:   18   Temp:   98.2 F (36.8 C)   TempSrc:   Oral   SpO2:      Weight:      Height:        Dilation: 3 Effacement (%): 50 Cervical Position: Posterior Station: -3 Presentation: Vertex Exam by:: Dr Sheran Fava Traci Castro FHT: baseline rate 120, moderate varibility, +acel, no decel Toco: q2 min   Labs: Lab Results  Component Value Date   WBC 14.2 (H) 07/22/2018   HGB 11.1 (L) 07/22/2018   HCT 36.4 07/22/2018   MCV 78.4 (L) 07/22/2018   PLT 350 07/22/2018    Patient Active Problem List   Diagnosis Date Noted  . Encounter for induction of labor 07/22/2018  . Hypotension, unspecified 07/16/2018  . Maternal morbid obesity, antepartum (HCC) 07/16/2018  . Anemia in pregnancy, third trimester 07/16/2018  . Fetal bradycardia, antepartum condition or complication 07/15/2018  . UTI (urinary tract infection) during pregnancy, second trimester 04/17/2018  . [name], antepartum 01/31/2018  . [name] 01/31/2018  . [name], antepartum 01/27/2018  . BMI 45.0-49.9, adult (HCC) 03/15/2016    Assessment / Plan: 22 y.o. G3P1011 at 63106w3d here for IOL for BPP 4/8.   Labor: Induction. S/p cytotec x3. FB recently fell out. Has been on Pitocin held at 6 mu/min. Will now start increasing Pitocin. Plan for AROM when cervix in more favorable position and fetal head more well applied.  Fetal Wellbeing:  Cat I  Pain  Control:  Plans for natural childbirth. Maternal support with FOB and duola at bedside.  Anticipated MOD:  NSVD   Traci Castro, D.O. OB Fellow  07/23/2018, 10:25 AM

## 2018-07-23 NOTE — Progress Notes (Signed)
OB/GYN Faculty Practice: Labor Progress Note  Subjective: Walking halls.   Objective: BP 119/71   Pulse 91   Temp 98.3 F (36.8 C) (Oral)   Resp 16   Ht 5\' 3"  (1.6 m)   Wt 126.3 kg   LMP 10/19/2017   SpO2 100%   BMI 49.32 kg/m  Gen: well-appearing, NAD Dilation: 1.5 Effacement (%): 50 Cervical Position: Middle Station: -3 Presentation: Vertex Exam by:: Cher NakaiKristin McLeod RNC  Assessment and Plan: 22 y.o. [id number] 8625w2d here for IOL for BPP 4/8 per MFM recommendations.   Labor: Induction started with cytotec at 1645.  -- cytotec x 2  -- plan to recheck and try to place FB at next check  -- pain control: open to epidural  -- PPH Risk: low  Fetal Well-Being: Known fetal bradycardia developed in 3rd trimester, was having weekly BPPs after noted on follow-up growth U/S for size > dates. Worsened with maternal hypotension - recent transfusion. EFW 2634g (62%) at 35w2. Cephalic by prior checks.  -- Category I - continuous fetal monitoring  -- GBS positive - PCN   -- s/p BMZ 11/19,21    Traci Kuznia S. Earlene PlaterWallace, DO OB/GYN Fellow, Faculty Practice  1:37 AM

## 2018-07-23 NOTE — Anesthesia Pain Management Evaluation Note (Signed)
  CRNA Pain Management Visit Note  Patient: Traci Castro, 22 y.o., female  "Hello I am a member of the anesthesia team at [facility]. We have an anesthesia team available at all times to provide care throughout the hospital, including epidural management and anesthesia for C-section. I don't know your plan for the delivery whether it a natural birth, water birth, IV sedation, nitrous supplementation, doula or epidural, but we want to meet your pain goals."   1.Was your pain managed to your expectations on prior hospitalizations?   Yes   2.What is your expectation for pain management during this hospitalization?     Labor support without medications  3.How can we help you reach that goal? Be available if needed  Record the patient's initial score and the patient's pain goal.   Pain: 7  Pain Goal: 10 [facility] wants you to be able to say your pain was always managed very well.  Johnanthony Wilden 07/23/2018

## 2018-07-23 NOTE — Lactation Note (Signed)
This note was copied from a baby's chart. Lactation Consultation Note  Patient Name: Traci Castro ZOXWR'U Date: 07/23/2018 Reason for consult: Initial assessment;Late-preterm 34-36.6wks  6 hours old LPI who is being exclusively BF by his mother, she's a P2 and somehow experienced BF. She was able to BF her first child for 2 months, but her milk supply dwindle a later disappear when she had to go back to work, she worked at [employer] and didn't have a place to pump. She has a Lansinoh DEBP at home.  Baby was already nursing when entering the room, according to parents, she just latched on, LC observed the feeding, mom had baby lying on the bed and holding her breasts, they're very large (mom is a bariatric patient) and they easily hanged all the way down to baby's mouth. Baby only fed for two minutes with no audible swallows noted while LC was in the room.  When reviewing hand expression, mom was able to get a small drop of colostrum out of both breasts. LC showed mom how to finger feed baby. RN has already set up a DEBP, mom will start pumping tonight. LC changed the flanges to a # 27 they seemed more appropriate. Mom asked if she could use Vaseline on her nipples for dryness, LC requested her RN some coconut oil and instructed mom how to use if prior feedings/pumping.  When [facility] asked mom how does she feel about supplementing, she kept silence and then she said: "I just don't want to mess up my milk supply if I supplement". Explained to mom that our first option for supplementation will always be mother's milk and if baby's blood sugar don't improve we may have to use some formula just to complete the volumes required for baby's age. Mom is aware that baby needs between 5-10 ml per feeding at this point, and that those volumes will continue to increase as baby gets older.  She voiced that her milk came in on day two with her first child, and she's hoping this time it will happen faster.  Reassured her that we'll make sure her baby is getting all the calories she needs; and praised her for her efforts of providing breastmilk for her baby.  Feeding plan  1. Encouraged mom to feed baby STS every 3 hours.  2. Mom will limit the feedings at the breast to no more than 30 minutes at a time 3. Mom will pump every 3 hours and at least once at night, a minimum of 8 pumping sessions in 24 hours; and will finger/spoon feed baby any amount of EBM she may get 4. She'll use coconut oil for breast care and prior pumping  BF brochure, BF resources and feeding diary were reviewed. Mom reported all questions and concerns were answered, she's aware of LC services and will call PRN.  Maternal Data Formula Feeding for Exclusion: No Has patient been taught Hand Expression?: Yes Does the patient have breastfeeding experience prior to this delivery?: Yes  Feeding Feeding Type: Breast Fed  LATCH Score Latch: Repeated attempts needed to sustain latch, nipple held in mouth throughout feeding, stimulation needed to elicit sucking reflex.  Audible Swallowing: None  Type of Nipple: Everted at rest and after stimulation(short shafted, mom has very large breasts, she's a bariatric patient)  Comfort (Breast/Nipple): Soft / non-tender  Hold (Positioning): No assistance needed to correctly position infant at breast.(Mom had infat almost lying down on the bed, but her breast are so large that  they're just hanging like a blanket on top of baby and he could easily latch, but only a few sucks on and off)  LATCH Score: 7  Interventions Interventions: Breast feeding basics reviewed;Assisted with latch;Skin to skin;Breast massage;Hand express;Reverse pressure;Breast compression;Support pillows;Coconut oil;DEBP  Lactation Tools Discussed/Used Tools: Pump;Coconut oil;Flanges Flange Size: 27 Breast pump type: Double-Electric Breast Pump WIC Program: Yes Pump Review: Setup, frequency, and  cleaning Initiated by:: RN Date initiated:: 07/23/18   Consult Status Consult Status: Follow-up Date: 07/24/18 Follow-up type: In-patient    Traci Castro Traci Castro 07/23/2018, 9:12 PM

## 2018-07-23 NOTE — Plan of Care (Signed)
  Problem: Education: Goal: Knowledge of General Education information will improve Description Including pain rating scale, medication(s)/side effects and non-pharmacologic comfort measures Outcome: Progressing   Problem: Health Behavior/Discharge Planning: Goal: Ability to manage health-related needs will improve Outcome: Progressing   Problem: Clinical Measurements: Goal: Ability to maintain clinical measurements within normal limits will improve Outcome: Progressing Goal: Will remain free from infection Outcome: Progressing Goal: Diagnostic test results will improve Outcome: Progressing Goal: Respiratory complications will improve Outcome: Progressing Goal: Cardiovascular complication will be avoided Outcome: Progressing   Problem: Activity: Goal: Risk for activity intolerance will decrease Outcome: Progressing   Problem: Nutrition: Goal: Adequate nutrition will be maintained Outcome: Progressing   Problem: Coping: Goal: Level of anxiety will decrease Outcome: Progressing   Problem: Elimination: Goal: Will not experience complications related to bowel motility Outcome: Progressing Goal: Will not experience complications related to urinary retention Outcome: Progressing   Problem: Pain Managment: Goal: General experience of comfort will improve Outcome: Progressing   Problem: Safety: Goal: Ability to remain free from injury will improve Outcome: Progressing   Problem: Skin Integrity: Goal: Risk for impaired skin integrity will decrease Outcome: Progressing   Problem: Education: Goal: Knowledge of Childbirth will improve Outcome: Progressing Goal: Ability to make informed decisions regarding treatment and plan of care will improve Outcome: Progressing Goal: Ability to state and carry out methods to decrease the pain will improve Outcome: Progressing   Problem: Coping: Goal: Ability to verbalize concerns and feelings about labor and delivery will  improve Outcome: Progressing   Problem: Life Cycle: Goal: Ability to make normal progression through stages of labor will improve Outcome: Progressing Goal: Ability to effectively push during vaginal delivery will improve Outcome: Progressing   Problem: Role Relationship: Goal: Ability to demonstrate positive interaction with the child will improve Outcome: Progressing   Problem: Safety: Goal: Risk of complications during labor and delivery will decrease Outcome: Progressing   Problem: Pain Management: Goal: Relief or control of pain from uterine contractions will improve Outcome: Progressing   

## 2018-07-23 NOTE — Progress Notes (Signed)
OB/GYN Faculty Practice: Labor Progress Note  Subjective: In room with sister, mother, husband and doula Alcario Droughtrica. States she does not want foley balloon. Had one with her first labor and had a very bad experience with it. Requesting to switch to pitocin.   Objective: BP 119/71   Pulse 91   Temp 98.3 F (36.8 C) (Oral)   Resp 16   Ht 5\' 3"  (1.6 m)   Wt 126.3 kg   LMP 10/19/2017   SpO2 100%   BMI 49.32 kg/m  Gen: eyes closed, uncomfortable appearing  Dilation: Closed Effacement (%): 50 Cervical Position: Middle Station: -3 Presentation: Vertex Exam by:: L Lawerence Dery DO  Assessment and Plan: 22 y.o. [id number] 1540w2d here for IOL for BPP 4/8 per MFM recommendations.   Labor: Induction started with cytotec at 1645. Exam previously documented as 1-2cm dilated but internal os is actually closed.  -- cytotec x 3  -- declined FB placement  -- pain control: open to epidural  -- PPH Risk: low  Fetal Well-Being: Known fetal bradycardia developed in 3rd trimester, was having weekly BPPs after noted on follow-up growth U/S for size > dates. Worsened with maternal hypotension - recent transfusion. EFW 2634g (62%) at 35w2. Cephalic by prior checks.  -- Category I - continuous fetal monitoring  -- GBS positive - PCN   -- s/p BMZ 11/19,21    Atisha Hamidi S. Earlene PlaterWallace, DO OB/GYN Fellow, Faculty Practice  2:49 AM

## 2018-07-24 NOTE — Progress Notes (Signed)
Post Partum Day 1 Subjective: no complaints, up ad lib, voiding, tolerating PO and + flatus  Objective: Blood pressure 116/67, pulse 75, temperature 97.6 F (36.4 C), temperature source Oral, resp. rate 18, height 5\' 3"  (1.6 m), weight 126.3 kg, last menstrual period 10/19/2017, SpO2 100 %, unknown if currently breastfeeding.  Physical Exam:  General: alert, cooperative and no distress Lochia: appropriate Uterine Fundus: firm Incision: n/a DVT Evaluation: No evidence of DVT seen on physical exam.  Recent Labs    07/22/18 1819  HGB 11.1*  HCT 36.4    Assessment/Plan: Plan for discharge tomorrow, Breastfeeding and Contraception undecided   LOS: 2 days   Sharen CounterLisa Leftwich-Kirby 07/24/2018, 11:18 AM

## 2018-07-24 NOTE — Clinical Social Work Maternal (Addendum)
CLINICAL SOCIAL WORK MATERNAL/CHILD NOTE  Patient Details  Name: Traci Castro MRN: [id number] Date of Birth: 01/29/1996  Date:  2018-03-24  Clinical Social Worker Initiating Note:  Blaine Hamper Date/Time: Initiated:  07/24/18/1139     Child's Name:  Traci Castro   Biological Parents:  Mother, Father   Need for Interpreter:  None   Reason for Referral:  Behavioral Health Concerns(hx of anxiety/depression and bipolar. )   Address:  [address] [place] [address]    Phone number:  [phone] (home)     Additional phone number:   Household Members/Support Persons (HM/SP):   Household Member/Support Person 1, Household Member/Support Person 2(Per MOB, MOB resides with her grandmother. )   HM/SP Name Relationship DOB or Age  HM/SP -1 Mizrahim Jackons son 05/05/16  HM/SP -2 Bertrum Sol FOB 10/06/1985  HM/SP -3        HM/SP -4        HM/SP -5        HM/SP -6        HM/SP -7        HM/SP -8          Natural Supports (not living in the home):  Immediate Family, Extended Family(MOB reported that FOB's family will also provide supports.)   Professional Supports: Therapist(MOB is an established patient with outpatient therapist  Leeanne Mannan. )   Employment: Part-time   Type of Work: Conservation officer, nature at [employer].   Education:  High school graduate   Homebound arranged:    Financial Resources:  Medicaid   Other Resources:  [employer], Sales executive    Cultural/Religious Considerations Which May Impact Care:  Per [employer] Face Sheet, MOB is [employer].   Strengths:  Ability to meet basic needs , Home prepared for child , Understanding of illness   Psychotropic Medications:         Pediatrician:       Pediatrician List:   [facility] [place]    [place]      Pediatrician Fax Number:    Risk Factors/Current Problems:  Mental Health Concerns    Cognitive State:  Able to Concentrate , Alert ,  Insightful , Linear Thinking    Mood/Affect:  Comfortable , Happy , Relaxed , Bright    CSW Assessment: CSW meet with MOB to complete an assessment for mental health concerns.  When CSW arrived, MOB was in the bed attaching and bonding with infant as evidence by engaging in skin to skin. MOB was inviting and polite.   CSW inquired about MIOB's MH and MOB acknowledged a hx of depression and bipolar disorder. MOB described her past depressive signs and symptoms as being situational due to MOB's unstable housing. MOB reported having stable housing now and feeling "Great."  CSW educated MOB about PPD. CSW informed MOB of possible supports and interventions to decrease PPD.  CSW also encouraged MOB to seek medical attention if needed for increased signs and symptoms of PPD.  CSW assessed for safety and MOB denied SI, HI and DV. CSW also offered MOB resources for outpatient behavioral health services and in-home parenting programs; MOB declined. CSW reviewed safe sleep and SIDS. MOB and was knowledgeable and asked appropriate questions.   CSW thanked MOB for meeting with CSW.   CSW Plan/Description:  No Further Intervention Required/No Barriers to Discharge, Sudden Infant Death Syndrome (SIDS) Education, Perinatal Mood and Anxiety Disorder (PMADs)  Education, Other Information/Referral to [facility] Resources, Other Patient/Family Education   Blaine HamperAngel Boyd-Gilyard, MSW, CDW CorporationLCSW Clinical Social Work [phone]   Barbara CowerNGEL D BOYD-GILYARD, LCSW 07/24/2018, 11:44 AM

## 2018-07-24 NOTE — Lactation Note (Signed)
This note was copied from a baby's chart. Lactation Consultation Note  Patient Name: Traci Castro ZHYQM'VToday's Date: 07/24/2018 Reason for consult: Follow-up assessment;Late-preterm 34-36.6wks  Visited with P2 Mom of LPTI at 3717 hrs old.  Mom has been double pumping and spoon feeding 1-2 ml of colostrum as baby has become more sleepy this am, and not latching to breast.  Reassured Mom that this was normal newborn behavior at 7080w3d.  Mom hesitant to formula supplement.  Talked about importance of small limited formula supplements, until her milk volume increases.  Praised Mom for expressing colostrum for baby.  Assisted Mom in spoon feeding 2 ml from her double pumping.   Plan- 1- Keep baby STS as much as possible 2- At least every 3 hrs, offer breast 3- Supplement with 5-10 ml of EBM+/formula by spoon, curved tip syringe or paced bottle at every feeding (.8 times/24 hrs) 4- Pump both breasts 15 mins on initiation setting, adding breast massage and hand expression to express as much colostrum, supplementing with formula to equal volume for age of baby.  At 24 hrs of age, guideline is 10-20 ml. 5- Ask for assistance with latching on cue.  Formula brought into room, with a measuring cup, curved tip syringe and spoons.  Mom aware of plan, which was written on dry erase board.  Consult Status Consult Status: Follow-up Date: 07/25/18 Follow-up type: In-patient    Judee ClaraSmith, Cohan Stipes E 07/24/2018, 8:45 AM

## 2018-07-25 MED ORDER — ACETAMINOPHEN 500 MG PO TABS: 1000.0000 mg | ORAL_TABLET | Freq: Four times a day (QID) | ORAL | 0 refills | Status: DC | PRN

## 2018-07-25 MED ORDER — IBUPROFEN 600 MG PO TABS: 600.0000 mg | ORAL_TABLET | Freq: Four times a day (QID) | ORAL | 0 refills | Status: DC

## 2018-07-25 MED ORDER — DOCUSATE SODIUM 100 MG PO CAPS: 100.0000 mg | ORAL_CAPSULE | Freq: Two times a day (BID) | ORAL | 2 refills | Status: DC | PRN

## 2018-07-25 NOTE — Lactation Note (Signed)
This note was copied from a baby's chart. Lactation Consultation Note: Mother has been pumping her breast after breastfeeding.  She reports that she pumped approx 10-15 ml., and has it on ice at the bedside.  Mother has been supplementing infant with 5 ml of formula after feeding for the last several feedings.   Advised mother to use her own ebm and then formula for total feeding.  Mother was given LPI guidelines and reviewed supplemental amts.  Discussed LPI infant behaviors.   Mother is comfortable using a curved tip syringe.  Discussed using a bottle nipple to offer infant more volume.  Advised mother to post pump at least 6 times in a 24 hours period.  Discussed hand expression and needed to hand express frequently. Mother has a Lansinoh pump at home.  And a harmony hand pump. [facility] and post [facility].  Mother receptive to all teaching. She is aware of all available LC services and community support. Patient Name: Traci Castro ZOXWR'UToday's Date: 07/25/2018 Reason for consult: Follow-up assessment   Maternal Data    Feeding Feeding Type: Formula  LATCH Score                   Interventions    Lactation Tools Discussed/Used     Consult Status      Traci Castro, Traci Castro 07/25/2018, 12:40 PM

## 2018-07-25 NOTE — Discharge Summary (Addendum)
OB Discharge Summary     Patient Name: Traci Castro DOB: Apr 17, 1996 MRN: [id number]  Date of admission: 07/22/2018 Delivering MD: Edd Arbour R   Date of discharge: 07/25/2018  Admitting diagnosis: 51 WKS, BPP Intrauterine pregnancy: [redacted]w[redacted]d     Secondary diagnosis:  Active Problems:   BMI 45.0-49.9, adult (HCC)   [name], antepartum   Maternal morbid obesity, antepartum (HCC)   Anemia in pregnancy, third trimester   Encounter for induction of labor  Additional problems: Preterm delviery     Discharge diagnosis: Preterm Pregnancy Delivered                                                                                                Post partum procedures:None  Augmentation: Pitocin, Cytotec and Foley Balloon  Complications: None  Hospital course:  Induction of Labor With Vaginal Delivery   22 y.o. yo [id number] at [redacted]w[redacted]d was admitted to the hospital 07/22/2018 for induction of labor.  Indication for induction: BPP 4/8.  Patient had an uncomplicated labor course as follows: Membrane Rupture Time/Date: 2:46 PM ,07/23/2018   Intrapartum Procedures: Episiotomy: None [1]                                         Lacerations:  None [1]  Patient had delivery of a Viable infant.  Information for the patient's newborn:  Emeline, Windell [[id number]]  Delivery Method: Vag-Spont   07/23/2018  Details of delivery can be found in separate delivery note.  Patient had a routine postpartum course. Patient is discharged home 07/25/18.  Physical exam  Vitals:   07/24/18 0533 07/24/18 1650 07/24/18 2245 07/25/18 0528  BP: 116/67 121/73 114/67 (!) 104/56  Pulse: 75 79 83 75  Resp: 18  16 15   Temp: 97.6 F (36.4 C) 98.6 F (37 C) 97.9 F (36.6 C) 98.1 F (36.7 C)  TempSrc: Oral Oral Oral Oral  SpO2: 100%  100% 100%  Weight:      Height:       General: alert, cooperative and no distress Lochia: appropriate Uterine Fundus: firm Incision: N/A DVT Evaluation:  No evidence of DVT seen on physical exam. Negative Homan's sign. No cords or calf tenderness. Labs: Lab Results  Component Value Date   WBC 14.2 (H) 07/22/2018   HGB 11.1 (L) 07/22/2018   HCT 36.4 07/22/2018   MCV 78.4 (L) 07/22/2018   PLT 350 07/22/2018   CMP Latest Ref Rng & Units 07/17/2018  Glucose 70 - 99 mg/dL 756(E)  BUN 6 - 20 mg/dL <3(P)  Creatinine 2.95 - 1.00 mg/dL 1.88  Sodium 416 - 606 mmol/L 136  Potassium 3.5 - 5.1 mmol/L 3.7  Chloride 98 - 111 mmol/L 107  CO2 22 - 32 mmol/L 20(L)  Calcium 8.9 - 10.3 mg/dL 8.2(L)  Total Protein 6.5 - 8.1 g/dL 6.2(L)  Total Bilirubin 0.3 - 1.2 mg/dL 0.3  Alkaline Phos 38 - 126 U/L 123  AST 15 - 41 U/L 14(L)  ALT 0 -  44 U/L 12    Discharge instruction: per After Visit Summary and "Baby and Me Booklet".  After visit meds:  Allergies as of 07/25/2018      Reactions   Chlorhexidine Itching, Rash   Severe itching, redness.    Tramadol Nausea And Vomiting      Medication List    TAKE these medications   acetaminophen 500 MG tablet Commonly known as:  TYLENOL Take 2 tablets (1,000 mg total) by mouth every 6 (six) hours as needed. What changed:  how much to take   COMFORT FIT MATERNITY SUPP LG Misc 1 application by Does not apply route daily.   docusate sodium 100 MG capsule Commonly known as:  COLACE Take 1 capsule (100 mg total) by mouth 2 (two) times daily as needed for mild constipation or moderate constipation.   famotidine 20 MG tablet Commonly known as:  PEPCID Take 1 tablet (20 mg total) by mouth 2 (two) times daily.   ferrous sulfate 325 (65 FE) MG tablet Take 1 tablet (325 mg total) by mouth 2 (two) times daily.   ibuprofen 600 MG tablet Commonly known as:  ADVIL,MOTRIN Take 1 tablet (600 mg total) by mouth every 6 (six) hours.   VITAFOL GUMMIES 3.33-0.333-34.8 MG Chew Chew 3 tablets by mouth at bedtime.       Diet: routine diet  Activity: Advance as tolerated. Pelvic rest for 6 weeks.    Outpatient follow up:4 weeks  Postpartum contraception: Undecided  Newborn Data: Live born female  Birth Weight: 6 lb 8.1 oz (2950 g) APGAR: 9, 9  Newborn Delivery   Birth date/time:  07/23/2018 14:47:00 Delivery type:  Vaginal, Spontaneous     Baby Feeding: Breast Disposition:home with mother   07/25/2018 Jaynie Collins, MD

## 2018-07-25 NOTE — Discharge Instructions (Signed)
Vaginal Delivery, Care After °Refer to this sheet in the next few weeks. These instructions provide you with information about caring for yourself after vaginal delivery. Your health care provider may also give you more specific instructions. Your treatment has been planned according to current medical practices, but problems sometimes occur. Call your health care provider if you have any problems or questions. °What can I expect after the procedure? °After vaginal delivery, it is common to have: °· Some bleeding from your vagina. °· Soreness in your abdomen, your vagina, and the area of skin between your vaginal opening and your anus (perineum). °· Pelvic cramps. °· Fatigue. ° °Follow these instructions at home: °Medicines °· Take over-the-counter and prescription medicines only as told by your health care provider. °· If you were prescribed an antibiotic medicine, take it as told by your health care provider. Do not stop taking the antibiotic until it is finished. °Driving ° °· Do not drive or operate heavy machinery while taking prescription pain medicine. °· Do not drive for 24 hours if you received a sedative. °Lifestyle °· Do not drink alcohol. This is especially important if you are breastfeeding or taking medicine to relieve pain. °· Do not use tobacco products, including cigarettes, chewing tobacco, or e-cigarettes. If you need help quitting, ask your health care provider. °Eating and drinking °· Drink at least 8 eight-ounce glasses of water every day unless you are told not to by your health care provider. If you choose to breastfeed your baby, you may need to drink more water than this. °· Eat high-fiber foods every day. These foods may help prevent or relieve constipation. High-fiber foods include: °? Whole grain cereals and breads. °? Brown rice. °? Beans. °? Fresh fruits and vegetables. °Activity °· Return to your normal activities as told by your health care provider. Ask your health care provider  what activities are safe for you. °· Rest as much as possible. Try to rest or take a nap when your baby is sleeping. °· Do not lift anything that is heavier than your baby or 10 lb (4.5 kg) until your health care provider says that it is safe. °· Talk with your health care provider about when you can engage in sexual activity. This may depend on your: °? Risk of infection. °? Rate of healing. °? Comfort and desire to engage in sexual activity. °Vaginal Care °· If you have an episiotomy or a vaginal tear, check the area every day for signs of infection. Check for: °? More redness, swelling, or pain. °? More fluid or blood. °? Warmth. °? Pus or a bad smell. °· Do not use tampons or douches until your health care provider says this is safe. °· Watch for any blood clots that may pass from your vagina. These may look like clumps of dark red, brown, or black discharge. °General instructions °· Keep your perineum clean and dry as told by your health care provider. °· Wear loose, comfortable clothing. °· Wipe from front to back when you use the toilet. °· Ask your health care provider if you can shower or take a bath. If you had an episiotomy or a perineal tear during labor and delivery, your health care provider may tell you not to take baths for a certain length of time. °· Wear a bra that supports your breasts and fits you well. °· If possible, have someone help you with household activities and help care for your baby for at least a few days after   you leave the hospital. °· Keep all follow-up visits for you and your baby as told by your health care provider. This is important. °Contact a health care provider if: °· You have: °? Vaginal discharge that has a bad smell. °? Difficulty urinating. °? Pain when urinating. °? A sudden increase or decrease in the frequency of your bowel movements. °? More redness, swelling, or pain around your episiotomy or vaginal tear. °? More fluid or blood coming from your episiotomy or  vaginal tear. °? Pus or a bad smell coming from your episiotomy or vaginal tear. °? A fever. °? A rash. °? Little or no interest in activities you used to enjoy. °? Questions about caring for yourself or your baby. °· Your episiotomy or vaginal tear feels warm to the touch. °· Your episiotomy or vaginal tear is separating or does not appear to be healing. °· Your breasts are painful, hard, or turn red. °· You feel unusually sad or worried. °· You feel nauseous or you vomit. °· You pass large blood clots from your vagina. If you pass a blood clot from your vagina, save it to show to your health care provider. Do not flush blood clots down the toilet without having your health care provider look at them. °· You urinate more than usual. °· You are dizzy or light-headed. °· You have not breastfed at all and you have not had a menstrual period for 12 weeks after delivery. °· You have stopped breastfeeding and you have not had a menstrual period for 12 weeks after you stopped breastfeeding. °Get help right away if: °· You have: °? Pain that does not go away or does not get better with medicine. °? Chest pain. °? Difficulty breathing. °? Blurred vision or spots in your vision. °? Thoughts about hurting yourself or your baby. °· You develop pain in your abdomen or in one of your legs. °· You develop a severe headache. °· You faint. °· You bleed from your vagina so much that you fill two sanitary pads in one hour. °This information is not intended to replace advice given to you by your health care provider. Make sure you discuss any questions you have with your health care provider. °Document Released: 08/10/2000 Document Revised: 01/25/2016 Document Reviewed: 08/28/2015 °Elsevier Interactive Patient Education © 2018 [employer] ° °

## 2018-07-30 ENCOUNTER — Encounter: Payer: 59 | Admitting: Obstetrics and Gynecology

## 2018-08-08 ENCOUNTER — Other Ambulatory Visit: Payer: Self-pay | Admitting: Certified Nurse Midwife

## 2018-08-08 ENCOUNTER — Other Ambulatory Visit (HOSPITAL_COMMUNITY): Payer: Self-pay | Admitting: Obstetrics & Gynecology

## 2018-08-08 DIAGNOSIS — R12 Heartburn: Principal | ICD-10-CM

## 2018-08-08 DIAGNOSIS — O26893 Other specified pregnancy related conditions, third trimester: Secondary | ICD-10-CM

## 2018-08-21 ENCOUNTER — Ambulatory Visit: Payer: 59 | Admitting: Obstetrics and Gynecology

## 2018-08-27 NOTE — L&D Delivery Note (Signed)
Patient is a 23 y.o. now G4P3 s/p NSVD at [redacted]w[redacted]d, who was admitted for Elective IOL.  She progressed with augmentation (pitocin, AROM) to complete and pushed <5 minutes to deliver.  Cord clamping delayed by several minutes then clamped by CNM and cut by FOB.  Placenta intact and spontaneous, bleeding minimal.  Mom and baby stable prior to transfer to postpartum. She plans on breastfeeding. She requests Nexplanon for birth control.  Delivery Note At 9:16 PM a viable and healthy female was delivered via Vaginal, Spontaneous (Presentation: OA ).  APGAR: 9, 9; weight pending  .   Placenta intact and spontaneous, bleeding minimal. 3VCord:  with no complications.  Anesthesia:  IV Fentanyl  Episiotomy: None Lacerations: None Suture Repair: none Est. Blood Loss (mL): 93  Mom to postpartum.  Baby to Couplet care / Skin to Skin.  Lajean Manes CNM 07/15/2019, 9:39 PM

## 2018-09-01 ENCOUNTER — Telehealth: Payer: Self-pay

## 2018-09-01 NOTE — Telephone Encounter (Signed)
Called patient - LMOVM in detail advising Rx for Famotidine(Pepcid) is at [facility] - she will need to contact pharmacy for pickup status.

## 2018-09-03 ENCOUNTER — Ambulatory Visit: Payer: 59 | Admitting: Obstetrics and Gynecology

## 2018-09-04 ENCOUNTER — Ambulatory Visit: Payer: 59 | Admitting: Obstetrics

## 2018-09-05 ENCOUNTER — Other Ambulatory Visit (HOSPITAL_COMMUNITY): Payer: Self-pay | Admitting: Obstetrics & Gynecology

## 2018-09-16 ENCOUNTER — Ambulatory Visit: Payer: 59 | Admitting: Obstetrics & Gynecology

## 2018-09-17 ENCOUNTER — Encounter: Payer: Self-pay | Admitting: Obstetrics & Gynecology

## 2018-09-17 ENCOUNTER — Ambulatory Visit (INDEPENDENT_AMBULATORY_CARE_PROVIDER_SITE_OTHER): Payer: 59 | Admitting: Obstetrics & Gynecology

## 2018-09-17 DIAGNOSIS — Z30011 Encounter for initial prescription of contraceptive pills: Secondary | ICD-10-CM

## 2018-09-17 DIAGNOSIS — Z3202 Encounter for pregnancy test, result negative: Secondary | ICD-10-CM | POA: Diagnosis not present

## 2018-09-17 DIAGNOSIS — Z6841 Body Mass Index (BMI) 40.0 and over, adult: Secondary | ICD-10-CM

## 2018-09-17 LAB — POCT URINE PREGNANCY: Preg Test, Ur: NEGATIVE

## 2018-09-17 MED ORDER — NORETHINDRONE 0.35 MG PO TABS: 1.0000 | ORAL_TABLET | Freq: Every day | ORAL | 11 refills | Status: DC

## 2018-09-17 NOTE — Progress Notes (Signed)
Post Partum Exam  Traci Castro is a 23 y.o. [phone] female who presents for a postpartum visit. She is 8 weeks postpartum following a spontaneous vaginal delivery. I have fully reviewed the prenatal and intrapartum course. The delivery was at [redacted]w[redacted]d gestational weeks.  Anesthesia: none. Postpartum course has been unremarkable. Baby's course has been unremarkable. Baby is feeding by both breast and bottle - Carnation Good Start. Bleeding no bleeding. Bowel function is normal. Bladder function is normal. Patient is sexually active. Contraception method is OCP (estrogen/progesterone). Postpartum depression screening:neg score 0  The following portions of the patient's history were reviewed and updated as appropriate: allergies, current medications, past family history, past medical history, past social history, past surgical history and problem list. Last pap smear done 01/28/18 and was Normal  Review of Systems Pertinent items are noted in HPI.    Objective:  Last menstrual period 10/19/2017, unknown if currently breastfeeding.  General:  alert, cooperative and no distress        Heart:     Abdomen: soft, non-tender; bowel sounds normal; no masses,  no organomegaly   Vulva:  not evaluated  Vagina: not evaluated                    Assessment:    normal postpartum exam. Pap smear not done at today's visit.   Plan:   1. Contraception: oral progesterone-only contraceptive 2. Start ASAP 3. Follow up as needed.   Adam Phenix, MD 09/17/2018

## 2018-09-21 ENCOUNTER — Other Ambulatory Visit (HOSPITAL_COMMUNITY): Payer: Self-pay | Admitting: Obstetrics & Gynecology

## 2018-10-12 ENCOUNTER — Ambulatory Visit (HOSPITAL_COMMUNITY)
Admission: EM | Admit: 2018-10-12 | Discharge: 2018-10-12 | Disposition: A | Discharge status: Home / Self Care | Payer: 59 | Attending: Internal Medicine | Admitting: Internal Medicine

## 2018-10-12 ENCOUNTER — Encounter (HOSPITAL_COMMUNITY): Payer: Self-pay | Admitting: Emergency Medicine

## 2018-10-12 DIAGNOSIS — H6120 Impacted cerumen, unspecified ear: Secondary | ICD-10-CM | POA: Diagnosis not present

## 2018-10-12 NOTE — ED Triage Notes (Signed)
Pt c/o bilateral ears feeling "clogged" x2 days.

## 2018-10-12 NOTE — ED Provider Notes (Signed)
[facility]    CSN: [id number] Arrival date & time: 10/12/18  1342     History   Chief Complaint Chief Complaint  Patient presents with  . Ear Problem    HPI Traci Castro is a 23 y.o. female.   23 year old postpartum female presents urgent care complaining of "clogging" of her ears.  I asked the patient to clarify if she could hear which she answers affirmatively.  She does not use Q-tips but she admits to an itchy sensation as well as feeling of fullness in her ears.  She admits that her infant son has recently had a cold and potentially an ear infection which has her concerned about her own ears.     Past Medical History:  Diagnosis Date  . Anemia   . Anxiety    no meds  . Bipolar disorder (HCC)   . Depression    no meds  . Gastritis    2 bouts  . H/O seasonal allergies   . Migraine    migraines  . Psoriasis   . SVD (spontaneous vaginal delivery)    x 1  . Vision abnormalities    wears glasses    Patient Active Problem List   Diagnosis Date Noted  . Hypotension, unspecified 07/16/2018  . [name] 01/31/2018  . BMI 45.0-49.9, adult (HCC) 03/15/2016    Past Surgical History:  Procedure Laterality Date  . DILATION AND CURETTAGE OF UTERUS    . DILATION AND EVACUATION N/A 09/20/2017   Procedure: SUCTION DILATATION AND EVACUATION;  Surgeon: [place] BingPickens, Charlie, MD;  Location: [facility];  Service: Gynecology;  Laterality: N/A;  . ESOPHAGOGASTRODUODENOSCOPY N/A 10/17/2012   Procedure: ESOPHAGOGASTRODUODENOSCOPY (EGD);  Surgeon: Jon GillsJoseph H Clark, MD;  [facility];  Service: Gastroenterology;  Laterality: N/A;  . THERAPEUTIC ABORTION  09/16/2017   Clinic  . WISDOM TOOTH EXTRACTION      OB History    Gravida  3   Para  2   Term  1   Preterm  1   AB  1   Living  2     SAB      TAB  1   Ectopic      Multiple  0   Live Births  2            Home Medications    Prior to Admission medications   Medication Sig Start  Date End Date Taking? Authorizing Provider  acetaminophen (TYLENOL) 500 MG tablet Take 2 tablets (1,000 mg total) by mouth every 6 (six) hours as needed. 07/25/18   Anyanwu, Jethro BastosUgonna A, MD  docusate sodium (COLACE) 100 MG capsule Take 1 capsule (100 mg total) by mouth 2 (two) times daily as needed for mild constipation or moderate constipation. Patient not taking: Reported on 09/17/2018 07/25/18   Tereso NewcomerAnyanwu, Ugonna A, MD  Elastic Bandages & Supports (COMFORT FIT MATERNITY SUPP LG) MISC 1 application by Does not apply route daily. Patient not taking: Reported on 09/17/2018 04/11/18   Donette LarryBhambri, Melanie, CNM  famotidine (PEPCID) 20 MG tablet Take 1 tablet (20 mg total) by mouth 2 (two) times daily. Patient not taking: Reported on 07/16/2018 07/14/18   Sharyon Cableogers, Veronica C, CNM  ferrous sulfate 325 (65 FE) MG tablet TAKE 1 TABLET BY MOUTH TWICE A DAY Patient not taking: Reported on 10/12/2018 09/22/18   Anyanwu, Jethro BastosUgonna A, MD  ibuprofen (ADVIL,MOTRIN) 600 MG tablet Take 1 tablet (600 mg total) by mouth every 6 (six) hours. Patient not taking: Reported  on 09/17/2018 07/25/18   Tereso Newcomer, MD  norethindrone (MICRONOR,CAMILA,ERRIN) 0.35 MG tablet Take 1 tablet (0.35 mg total) by mouth daily. 09/17/18   Adam Phenix, MD  Prenatal Vit-Fe Phos-FA-Omega (VITAFOL GUMMIES) 3.33-0.333-34.8 MG CHEW Chew 3 tablets by mouth at bedtime. Patient not taking: Reported on 09/17/2018 01/28/18   Roe Coombs, CNM    Family History Family History  Problem Relation Age of Onset  . Cholelithiasis Mother   . Hyperlipidemia Mother   . Hypertension Mother   . Mental illness Mother   . Cholelithiasis Maternal Uncle   . Hyperlipidemia Maternal Uncle   . Hypertension Maternal Uncle   . Cholelithiasis Maternal Grandmother   . Arthritis Maternal Grandmother   . COPD Maternal Grandmother   . Depression Maternal Grandmother   . Diabetes Maternal Grandmother   . Heart disease Maternal Grandmother   . Hyperlipidemia  Maternal Grandmother   . Hypertension Maternal Grandmother   . Stroke Maternal Grandmother   . Learning disabilities Paternal Grandmother   . Mental illness Paternal Grandmother   . Cancer Paternal Grandmother   . Hyperlipidemia Paternal Grandfather   . Diabetes Paternal Grandfather   . Depression Father   . Hypertension Father   . Ulcers Neg Hx   . Alcohol abuse Neg Hx   . Asthma Neg Hx   . Birth defects Neg Hx   . Drug abuse Neg Hx   . Early death Neg Hx   . Hearing loss Neg Hx   . Kidney disease Neg Hx   . Mental retardation Neg Hx   . Miscarriages / Stillbirths Neg Hx   . Vision loss Neg Hx     Social History Social History   Tobacco Use  . Smoking status: Never Smoker  . Smokeless tobacco: Never Used  Substance Use Topics  . Alcohol use: No  . Drug use: No     Allergies   Chlorhexidine and Tramadol   Review of Systems Review of Systems  Constitutional: Negative for chills and fever.  HENT: Negative for sore throat and tinnitus.   Eyes: Negative for redness.  Respiratory: Negative for cough and shortness of breath.   Cardiovascular: Negative for chest pain and palpitations.  Gastrointestinal: Negative for abdominal pain, diarrhea, nausea and vomiting.  Genitourinary: Negative for dysuria, frequency and urgency.  Musculoskeletal: Negative for myalgias.  Skin: Negative for rash.       No lesions  Neurological: Negative for weakness.  Hematological: Does not bruise/bleed easily.  Psychiatric/Behavioral: Negative for suicidal ideas.     Physical Exam Triage Vital Signs ED Triage Vitals  Enc Vitals Group     BP 10/12/18 1500 112/73     Pulse Rate 10/12/18 1500 72     Resp 10/12/18 1500 16     Temp 10/12/18 1500 98.5 F (36.9 C)     Temp Source 10/12/18 1500 Oral     SpO2 10/12/18 1500 100 %     Weight --      Height --      Head Circumference --      Peak Flow --      Pain Score 10/12/18 1509 6     Pain Loc --      Pain Edu? --      Excl. in  GC? --    No data found.  Updated Vital Signs BP 112/73 (BP Location: Right Arm)   Pulse 72   Temp 98.5 F (36.9 C) (Oral)   Resp 16  SpO2 100%   Breastfeeding Yes   Visual Acuity Right Eye Distance:   Left Eye Distance:   Bilateral Distance:    Right Eye Near:   Left Eye Near:    Bilateral Near:     Physical Exam Vitals signs and nursing note reviewed.  Constitutional:      General: She is not in acute distress.    Appearance: She is well-developed.  HENT:     Head: Normocephalic and atraumatic.     Comments: Scant amount of earwax in inferior portion of both canals Eyes:     General: No scleral icterus.    Conjunctiva/sclera: Conjunctivae normal.     Pupils: Pupils are equal, round, and reactive to light.  Neck:     Musculoskeletal: Normal range of motion and neck supple.     Thyroid: No thyromegaly.     Vascular: No JVD.     Trachea: No tracheal deviation.  Cardiovascular:     Rate and Rhythm: Normal rate and regular rhythm.     Heart sounds: Normal heart sounds. No murmur. No friction rub. No gallop.   Pulmonary:     Effort: Pulmonary effort is normal.     Breath sounds: Normal breath sounds.  Abdominal:     General: Bowel sounds are normal. There is no distension.     Palpations: Abdomen is soft.     Tenderness: There is no abdominal tenderness.  Musculoskeletal: Normal range of motion.  Lymphadenopathy:     Cervical: No cervical adenopathy.  Skin:    General: Skin is warm and dry.  Neurological:     Mental Status: She is alert and oriented to person, place, and time.     Cranial Nerves: No cranial nerve deficit.  Psychiatric:        Behavior: Behavior normal.        Thought Content: Thought content normal.        Judgment: Judgment normal.      UC Treatments / Results  Labs (all labs ordered are listed, but only abnormal results are displayed) Labs Reviewed - No data to display  EKG None  Radiology No results  found.  Procedures Procedures (including critical care time)  Medications Ordered in UC Medications - No data to display  Initial Impression / Assessment and Plan / UC Course  I have reviewed the triage vital signs and the nursing notes.  Pertinent labs & imaging results that were available during my care of the patient were reviewed by me and considered in my medical decision making (see chart for details).     No impaction of cerumen.  Small amount of still soft/sticky wax in the inferior portion of the external canal.  Recommended Debrox or hydrogen peroxide.  Discouraged Q-tips.  Final Clinical Impressions(s) / UC Diagnoses   Final diagnoses:  Wax in ear   Discharge Instructions   None    ED Prescriptions    None     Controlled Substance Prescriptions [place] Controlled Substance Registry consulted? Not Applicable   Arnaldo Natal, MD 10/12/18 [phone]

## 2018-10-31 ENCOUNTER — Other Ambulatory Visit (HOSPITAL_COMMUNITY): Payer: Self-pay | Admitting: Obstetrics & Gynecology

## 2018-10-31 ENCOUNTER — Telehealth: Payer: Self-pay

## 2018-10-31 NOTE — Telephone Encounter (Signed)
Called pt about paperwork, no answer, left vm.

## 2018-11-04 ENCOUNTER — Telehealth: Payer: Self-pay | Admitting: *Deleted

## 2018-11-04 NOTE — Telephone Encounter (Signed)
Pt made aware that paperwork is complete and she may pick up at front desk.

## 2019-03-19 ENCOUNTER — Other Ambulatory Visit: Payer: Self-pay | Admitting: Certified Nurse Midwife

## 2019-03-19 ENCOUNTER — Ambulatory Visit (INDEPENDENT_AMBULATORY_CARE_PROVIDER_SITE_OTHER): Payer: 59

## 2019-03-19 ENCOUNTER — Other Ambulatory Visit: Payer: Self-pay

## 2019-03-19 DIAGNOSIS — O0930 Supervision of pregnancy with insufficient antenatal care, unspecified trimester: Secondary | ICD-10-CM

## 2019-03-19 DIAGNOSIS — Z3480 Encounter for supervision of other normal pregnancy, unspecified trimester: Secondary | ICD-10-CM

## 2019-03-19 DIAGNOSIS — O09629 Supervision of young multigravida, unspecified trimester: Secondary | ICD-10-CM

## 2019-03-19 MED ORDER — VITAFOL GUMMIES 3.33-0.333-34.8 MG PO CHEW: 1.0000 | CHEWABLE_TABLET | Freq: Every day | ORAL | 5 refills | Status: DC

## 2019-03-19 MED ORDER — BLOOD PRESSURE KIT: PACK | 0 refills | Status: DC

## 2019-03-19 NOTE — Progress Notes (Signed)
  Virtual Visit via Telephone Note  I connected with Traci Castro on 03/19/19 at  3:15 PM EDT by telephone and verified that I am speaking with the correct person using two identifiers.  Location: Patient: Traci Castro  Provider: [facility] [place]   I discussed the limitations, risks, security and privacy concerns of performing an evaluation and management service by telephone and the availability of in person appointments. I also discussed with the patient that there may be a patient responsible charge related to this service. The patient expressed understanding and agreed to proceed.   History of Present Illness: PRENATAL INTAKE SUMMARY  Traci Castro presents today New OB Nurse Interview.  OB History    Gravida  4   Para  2   Term  1   Preterm  1   AB  1   Living  2     SAB      TAB  1   Ectopic      Multiple  0   Live Births  2          I have reviewed the patient's medical, obstetrical, social, and family histories, medications, and available lab results.  SUBJECTIVE She has no unusual complaints. Pt is breastfeeding and was taking mini pills with +UPT   Observations/Objective: Initial nurse interview for history (New OB)  EDD: 07/22/2019 GA: [redacted]w[redacted]d G4/P2 FHT: 140  GENERAL APPEARANCE: alert, well appearing, non face to face interview   Assessment and Plan: Prenatal care at [facility] NOB intake completed  Physical and labs to be completed at provider NOB visit  Stop breastfeeding 50 month old  Download Babyscripts  BP cuff explained in detail, ordered, and faxed  Start PNV's sent to the pharmacy   Follow Up Instructions:   I discussed the assessment and treatment plan with the patient. The patient was provided an opportunity to ask questions and all were answered. The patient agreed with the plan and demonstrated an understanding of the instructions.  I provided 20 minutes of non-face-to-face time during this encounter.    Maryruth Eve, CMA

## 2019-03-20 NOTE — Progress Notes (Signed)
Agree with A & P. 

## 2019-03-27 ENCOUNTER — Telehealth: Payer: Self-pay | Admitting: *Deleted

## 2019-03-27 NOTE — Telephone Encounter (Signed)
Copied from [place] [id number]. Topic: General - Other >> Mar 27, 2019  4:11 PM Keene Breath wrote: Reason for CRM: Patient called to schedule a new patient appt.  Tried the office but there was no answer.  Call patient back to schedule at [phone]

## 2019-03-30 ENCOUNTER — Encounter (HOSPITAL_COMMUNITY): Payer: Self-pay

## 2019-03-31 ENCOUNTER — Ambulatory Visit (HOSPITAL_COMMUNITY): Payer: 59 | Admitting: *Deleted

## 2019-03-31 ENCOUNTER — Ambulatory Visit (HOSPITAL_COMMUNITY): Payer: 59

## 2019-03-31 ENCOUNTER — Ambulatory Visit (HOSPITAL_COMMUNITY)
Admission: RE | Admit: 2019-03-31 | Discharge: 2019-03-31 | Disposition: A | Discharge status: Home / Self Care | Payer: 59 | Source: Ambulatory Visit | Attending: Certified Nurse Midwife | Admitting: Certified Nurse Midwife

## 2019-03-31 ENCOUNTER — Other Ambulatory Visit: Payer: Self-pay

## 2019-03-31 ENCOUNTER — Encounter (HOSPITAL_COMMUNITY): Payer: Self-pay

## 2019-03-31 VITALS — BP 122/66 | HR 97 | Temp 98.6°F

## 2019-03-31 DIAGNOSIS — Z8751 Personal history of pre-term labor: Secondary | ICD-10-CM

## 2019-03-31 DIAGNOSIS — Z3A23 23 weeks gestation of pregnancy: Secondary | ICD-10-CM

## 2019-03-31 DIAGNOSIS — O0932 Supervision of pregnancy with insufficient antenatal care, second trimester: Secondary | ICD-10-CM | POA: Diagnosis not present

## 2019-03-31 DIAGNOSIS — O99212 Obesity complicating pregnancy, second trimester: Secondary | ICD-10-CM | POA: Diagnosis not present

## 2019-03-31 DIAGNOSIS — O09212 Supervision of pregnancy with history of pre-term labor, second trimester: Secondary | ICD-10-CM

## 2019-03-31 DIAGNOSIS — O09292 Supervision of pregnancy with other poor reproductive or obstetric history, second trimester: Secondary | ICD-10-CM

## 2019-03-31 DIAGNOSIS — O0930 Supervision of pregnancy with insufficient antenatal care, unspecified trimester: Secondary | ICD-10-CM | POA: Insufficient documentation

## 2019-03-31 DIAGNOSIS — O09629 Supervision of young multigravida, unspecified trimester: Secondary | ICD-10-CM | POA: Insufficient documentation

## 2019-03-31 DIAGNOSIS — O09892 Supervision of other high risk pregnancies, second trimester: Secondary | ICD-10-CM

## 2019-03-31 NOTE — Telephone Encounter (Signed)
Dr Jerilee Hoh does not accept patients younger than 29.  Patient is aware.

## 2019-04-01 ENCOUNTER — Other Ambulatory Visit (HOSPITAL_COMMUNITY): Payer: Self-pay | Admitting: *Deleted

## 2019-04-01 DIAGNOSIS — O09219 Supervision of pregnancy with history of pre-term labor, unspecified trimester: Secondary | ICD-10-CM

## 2019-04-01 DIAGNOSIS — Z362 Encounter for other antenatal screening follow-up: Secondary | ICD-10-CM

## 2019-04-02 ENCOUNTER — Telehealth: Payer: Self-pay | Admitting: Obstetrics

## 2019-04-02 ENCOUNTER — Ambulatory Visit: Payer: Self-pay | Admitting: Internal Medicine

## 2019-04-02 DIAGNOSIS — O99342 Other mental disorders complicating pregnancy, second trimester: Secondary | ICD-10-CM

## 2019-04-02 DIAGNOSIS — O09629 Supervision of young multigravida, unspecified trimester: Secondary | ICD-10-CM

## 2019-04-02 DIAGNOSIS — F329 Major depressive disorder, single episode, unspecified: Secondary | ICD-10-CM

## 2019-04-02 NOTE — Telephone Encounter (Signed)
I received a call from Welton Flakes with the [facility] at the [place].  She was calling to follow up regarding Traci Castro.  Ms. Friebel is experiencing depression and expressed that she had shared this with her nurse at the time of her pregnancy test.   Patient is scheduled next week for her new ob visit.  She was late to care.    Referral placed for her to have a virtual visit with [facility] Cerise.

## 2019-04-08 ENCOUNTER — Ambulatory Visit (INDEPENDENT_AMBULATORY_CARE_PROVIDER_SITE_OTHER): Payer: 59 | Admitting: Certified Nurse Midwife

## 2019-04-08 ENCOUNTER — Other Ambulatory Visit (HOSPITAL_COMMUNITY)
Admission: RE | Admit: 2019-04-08 | Discharge: 2019-04-08 | Disposition: A | Discharge status: Home / Self Care | Payer: 59 | Source: Ambulatory Visit | Attending: Certified Nurse Midwife | Admitting: Certified Nurse Midwife

## 2019-04-08 ENCOUNTER — Encounter: Payer: Self-pay | Admitting: Obstetrics

## 2019-04-08 ENCOUNTER — Encounter: Payer: Self-pay | Admitting: Family Medicine

## 2019-04-08 ENCOUNTER — Encounter: Payer: Self-pay | Admitting: Certified Nurse Midwife

## 2019-04-08 ENCOUNTER — Other Ambulatory Visit: Payer: Self-pay

## 2019-04-08 VITALS — BP 108/71 | HR 91 | Temp 98.0°F | Wt 294.8 lb

## 2019-04-08 DIAGNOSIS — O09899 Supervision of other high risk pregnancies, unspecified trimester: Secondary | ICD-10-CM

## 2019-04-08 DIAGNOSIS — O0932 Supervision of pregnancy with insufficient antenatal care, second trimester: Secondary | ICD-10-CM | POA: Insufficient documentation

## 2019-04-08 DIAGNOSIS — Z3481 Encounter for supervision of other normal pregnancy, first trimester: Secondary | ICD-10-CM | POA: Diagnosis not present

## 2019-04-08 DIAGNOSIS — O09212 Supervision of pregnancy with history of pre-term labor, second trimester: Secondary | ICD-10-CM

## 2019-04-08 DIAGNOSIS — O99210 Obesity complicating pregnancy, unspecified trimester: Secondary | ICD-10-CM

## 2019-04-08 DIAGNOSIS — O99212 Obesity complicating pregnancy, second trimester: Secondary | ICD-10-CM

## 2019-04-08 DIAGNOSIS — Z3A25 25 weeks gestation of pregnancy: Secondary | ICD-10-CM

## 2019-04-08 DIAGNOSIS — O09892 Supervision of other high risk pregnancies, second trimester: Secondary | ICD-10-CM

## 2019-04-08 DIAGNOSIS — Z3480 Encounter for supervision of other normal pregnancy, unspecified trimester: Secondary | ICD-10-CM | POA: Insufficient documentation

## 2019-04-08 MED ORDER — ASPIRIN EC 81 MG PO TBEC: 81.0000 mg | DELAYED_RELEASE_TABLET | Freq: Every day | ORAL | 0 refills | Status: DC

## 2019-04-08 NOTE — Patient Instructions (Signed)
Glucose Tolerance Test During Pregnancy Why am I having this test? The glucose tolerance test (GTT) is done to check how your body processes sugar (glucose). This is one of several tests used to diagnose diabetes that develops during pregnancy (gestational diabetes mellitus). Gestational diabetes is a temporary form of diabetes that some women develop during pregnancy. It usually occurs during the second trimester of pregnancy and goes away after delivery. Testing (screening) for gestational diabetes usually occurs between 24 and 28 weeks of pregnancy. You may have the GTT test after having a 1-hour glucose screening test if the results from that test indicate that you may have gestational diabetes. You may also have this test if:  You have a history of gestational diabetes.  You have a history of giving birth to very large babies or have experienced repeated fetal loss (stillbirth).  You have signs and symptoms of diabetes, such as: ? Changes in your vision. ? Tingling or numbness in your hands or feet. ? Changes in hunger, thirst, and urination that are not otherwise explained by your pregnancy. What is being tested? This test measures the amount of glucose in your blood at different times during a period of 3 hours. This indicates how well your body is able to process glucose. What kind of sample is taken?  Blood samples are required for this test. They are usually collected by inserting a needle into a blood vessel. How do I prepare for this test?  For 3 days before your test, eat normally. Have plenty of carbohydrate-rich foods.  Follow instructions from your health care provider about: ? Eating or drinking restrictions on the day of the test. You may be asked to not eat or drink anything other than water (fast) starting 8-10 hours before the test. ? Changing or stopping your regular medicines. Some medicines may interfere with this test. Tell a health care provider about:  All  medicines you are taking, including vitamins, herbs, eye drops, creams, and over-the-counter medicines.  Any blood disorders you have.  Any surgeries you have had.  Any medical conditions you have. What happens during the test? First, your blood glucose will be measured. This is referred to as your fasting blood glucose, since you fasted before the test. Then, you will drink a glucose solution that contains a certain amount of glucose. Your blood glucose will be measured again 1, 2, and 3 hours after drinking the solution. This test takes about 3 hours to complete. You will need to stay at the testing location during this time. During the testing period:  Do not eat or drink anything other than the glucose solution.  Do not exercise.  Do not use any products that contain nicotine or tobacco, such as cigarettes and e-cigarettes. If you need help stopping, ask your health care provider. The testing procedure may vary among health care providers and hospitals. How are the results reported? Your results will be reported as milligrams of glucose per deciliter of blood (mg/dL) or millimoles per liter (mmol/L). Your health care provider will compare your results to normal ranges that were established after testing a large group of people (reference ranges). Reference ranges may vary among labs and hospitals. For this test, common reference ranges are:  Fasting: less than 95-105 mg/dL (5.3-5.8 mmol/L).  1 hour after drinking glucose: less than 180-190 mg/dL (10.0-10.5 mmol/L).  2 hours after drinking glucose: less than 155-165 mg/dL (8.6-9.2 mmol/L).  3 hours after drinking glucose: 140-145 mg/dL (7.8-8.1 mmol/L). What do the   results mean? Results within reference ranges are considered normal, meaning that your glucose levels are well-controlled. If two or more of your blood glucose levels are high, you may be diagnosed with gestational diabetes. If only one level is high, your health care  provider may suggest repeat testing or other tests to confirm a diagnosis. Talk with your health care provider about what your results mean. Questions to ask your health care provider Ask your health care provider, or the department that is doing the test:  When will my results be ready?  How will I get my results?  What are my treatment options?  What other tests do I need?  What are my next steps? Summary  The glucose tolerance test (GTT) is one of several tests used to diagnose diabetes that develops during pregnancy (gestational diabetes mellitus). Gestational diabetes is a temporary form of diabetes that some women develop during pregnancy.  You may have the GTT test after having a 1-hour glucose screening test if the results from that test indicate that you may have gestational diabetes. You may also have this test if you have any symptoms or risk factors for gestational diabetes.  Talk with your health care provider about what your results mean. This information is not intended to replace advice given to you by your health care provider. Make sure you discuss any questions you have with your health care provider. Document Released: 02/12/2012 Document Revised: 12/04/2018 Document Reviewed: 03/25/2017 Elsevier Patient Education  2020 Elsevier Inc.  

## 2019-04-08 NOTE — Progress Notes (Signed)
History:   Traci Castro is a 23 y.o. [id number] at 36w0dby LMP being seen today for her first obstetrical visit.  Her obstetrical history is significant for obesity and late to prenatal care and preterm delivery . Patient does intend to breast feed. Pregnancy history fully reviewed.  Patient reports no complaints.      HISTORY: OB History  Gravida Para Term Preterm AB Living  _0 SAB TAB Ectopic Multiple Live Births  0 1 0 0 2    # Outcome Date GA Lbr Len/2nd Weight Sex Delivery Anes PTL Lv  4 Current           3 Preterm 07/23/18 324w3d1:45 / 00:02 6 lb 8.1 oz (2.95 kg) M Vag-Spont None  LIV     Birth Comments: wnl     Name: Traci Castro   Apgar1: 9  Apgar5: 9  2 TAB 2019 1055w3d      1 Term 05/05/16 37w53w2d05 / 00:50 7 lb 6.2 oz (3.351 kg) M Vag-Spont Local  LIV     Name: Traci Castro Apgar1: 8  Apgar5: 9    Last pap smear was done 01/2018 and was normal  Past Medical History:  Diagnosis Date  . Anemia   . Anxiety    no meds  . Bipolar disorder (HCC)[place]. Depression    no meds  . Gastritis    2 bouts  . H/O seasonal allergies   . Migraine    migraines  . Psoriasis   . SVD (spontaneous vaginal delivery)    x 1  . Vision abnormalities    wears glasses   Past Surgical History:  Procedure Laterality Date  . DILATION AND CURETTAGE OF UTERUS    . DILATION AND EVACUATION N/A 09/20/2017   Procedure: SUCTION DILATATION AND EVACUATION;  Surgeon: PickAletha Halim;  [facility] [place];  Service: Gynecology;  Laterality: N/A;  . ESOPHAGOGASTRODUODENOSCOPY N/A 10/17/2012   Procedure: ESOPHAGOGASTRODUODENOSCOPY (EGD);  Surgeon: JoseOletha Blend;  [facility];  Laterality: N/A;  . THERAPEUTIC ABORTION  09/16/2017   Clinic  . WISDOM TOOTH EXTRACTION     Family History  Problem Relation Age of Onset  . Cholelithiasis Mother   . Hyperlipidemia Mother   . Hypertension Mother   . Mental illness  Mother   . Cholelithiasis Maternal Uncle   . Hyperlipidemia Maternal Uncle   . Hypertension Maternal Uncle   . Cholelithiasis Maternal Grandmother   . Arthritis Maternal Grandmother   . COPD Maternal Grandmother   . Depression Maternal Grandmother   . Diabetes Maternal Grandmother   . Heart disease Maternal Grandmother   . Hyperlipidemia Maternal Grandmother   . Hypertension Maternal Grandmother   . Stroke Maternal Grandmother   . Learning disabilities Paternal Grandmother   . Mental illness Paternal Grandmother   . Cancer Paternal Grandmother   . Hyperlipidemia Paternal Grandfather   . Diabetes Paternal Grandfather   . Depression Father   . Hypertension Father   . Ulcers Neg Hx   . Alcohol abuse Neg Hx   . Asthma Neg Hx   . Birth defects Neg Hx   . Drug abuse Neg Hx   . Early death Neg Hx   . Hearing loss Neg Hx   . Kidney disease Neg Hx   . Mental retardation Neg Hx   . Miscarriages / Stillbirths Neg  Hx   . Vision loss Neg Hx    Social History   Tobacco Use  . Smoking status: Never Smoker  . Smokeless tobacco: Never Used  Substance Use Topics  . Alcohol use: No  . Drug use: No   Allergies  Allergen Reactions  . Chlorhexidine Itching and Rash    Severe itching, redness.   . Tramadol Nausea And Vomiting   Current Outpatient Medications on File Prior to Visit  Medication Sig Dispense Refill  . acetaminophen (TYLENOL) 500 MG tablet Take 2 tablets (1,000 mg total) by mouth every 6 (six) hours as needed. 30 tablet 0  . Blood Pressure KIT Monitor blood pressure at home regularly extra large O09.90 (Patient not taking: Reported on 03/19/2019) 1 kit 0  . docusate sodium (COLACE) 100 MG capsule Take 1 capsule (100 mg total) by mouth 2 (two) times daily as needed for mild constipation or moderate constipation. (Patient not taking: Reported on 09/17/2018) 30 capsule 2  . Elastic Bandages & Supports (COMFORT FIT MATERNITY SUPP LG) MISC 1 application by Does not apply route  daily. (Patient not taking: Reported on 09/17/2018) 1 each 0  . famotidine (PEPCID) 20 MG tablet Take 1 tablet (20 mg total) by mouth 2 (two) times daily. (Patient not taking: Reported on 07/16/2018) 60 tablet 0  . ferrous sulfate 325 (65 FE) MG tablet TAKE 1 TABLET BY MOUTH TWICE A DAY (Patient not taking: Reported on 03/19/2019) 60 tablet 1  . ibuprofen (ADVIL,MOTRIN) 600 MG tablet Take 1 tablet (600 mg total) by mouth every 6 (six) hours. (Patient not taking: Reported on 09/17/2018) 30 tablet 0  . Prenatal Vit-Fe Phos-FA-Omega (VITAFOL GUMMIES) 3.33-0.333-34.8 MG CHEW Chew 1 tablet by mouth daily. 90 tablet 5   No current facility-administered medications on file prior to visit.     Review of Systems Pertinent items noted in HPI and remainder of comprehensive ROS otherwise negative. Physical Exam:   Vitals:   04/08/19 1422  BP: 108/71  Pulse: 91  Temp: 98 F (36.7 C)  Weight: 294 lb 12.8 oz (133.7 kg)   Fetal Heart Rate (bpm): 148 Uterus:  Fundal Height: 31 cm  System: General: well-developed, morbid obese female in no acute distress   Skin: normal coloration and turgor, no rashes   Neurologic: oriented, normal, negative, normal mood   Extremities: normal strength, tone, and muscle mass, ROM of all joints is normal   HEENT PERRLA, extraocular movement intact and sclera clear   Mouth/Teeth mucous membranes moist, pharynx normal without lesions and dental hygiene good   Neck supple and no masses   Cardiovascular: regular rate and rhythm   Respiratory:  no respiratory distress, normal breath sounds   Abdomen: soft, non-tender; gravid larger for gestational age, FH difficult d/t obesity, bowel sounds normal; no masses,  no organomegaly     Assessment:    Pregnancy: [id number] Patient Active Problem List   Diagnosis Date Noted  . [name], antepartum 04/08/2019  . [name] 04/08/2019  . Short interval  between pregnancies affecting pregnancy in second trimester, antepartum 04/08/2019  . [name], currently pregnant 04/08/2019  . Hypotension, unspecified 07/16/2018  . Obesity during pregnancy, antepartum 07/16/2018  . [name] 01/31/2018  . BMI 45.0-49.9, adult ([place]) 03/15/2016     Plan:    1. [name], antepartum - Routine prenatal care - Reactivation of babyscripts, has not received cuff  - Anticipatory  guidance on upcoming appointments including GTT at next prenatal visit  - COVID19 precautions  - Obstetric Panel, Including HIV - Culture, OB Urine - Babyscripts Schedule Optimization - Genetic Screening - Urine cytology ancillary only([place])  2. [name] - Care initiated at 22 weeks   3. [name], antepartum - Recent delivery November 2019   4. [name], currently pregnant - Induced at 36.3 weeks for fetal bradycardia with delivery November 2019   5. Obesity during pregnancy, antepartum - Discussed recommended weight gain during pregnancy  - aspirin EC 81 MG tablet; Take 1 tablet (81 mg total) by mouth daily. Take after 12 weeks for prevention of preeclampsia later in pregnancy  Dispense: 300 tablet; Refill: 0   Initial labs drawn. Continue prenatal vitamins. Genetic Screening discussed, NIPS: ordered. Ultrasound discussed; fetal anatomic survey: results reviewed. Problem list reviewed and updated. The nature of [place] with multiple MDs and other Advanced Practice Providers was explained to patient; also emphasized that residents, students are part of our team. Routine obstetric precautions reviewed. Return in about 3 weeks (around 04/29/2019) for ROB/GTT.     Lajean Manes, [place] for [employer], [place]

## 2019-04-08 NOTE — Progress Notes (Signed)
NEW OB late to Care.  Reports no problems today.

## 2019-04-10 LAB — OBSTETRIC PANEL, INCLUDING HIV
Antibody Screen: NEGATIVE
Basophils Absolute: 0.1 10*3/uL (ref 0.0–0.2)
Basos: 1 %
EOS (ABSOLUTE): 0.1 10*3/uL (ref 0.0–0.4)
Eos: 1 %
HIV Screen 4th Generation wRfx: NONREACTIVE
Hematocrit: 36.3 % (ref 34.0–46.6)
Hemoglobin: 12 g/dL (ref 11.1–15.9)
Hepatitis B Surface Ag: NEGATIVE
Immature Grans (Abs): 0.1 10*3/uL (ref 0.0–0.1)
Immature Granulocytes: 1 %
Lymphocytes Absolute: 3.5 10*3/uL — ABNORMAL HIGH (ref 0.7–3.1)
Lymphs: 33 %
MCH: 26.5 pg — ABNORMAL LOW (ref 26.6–33.0)
MCHC: 33.1 g/dL (ref 31.5–35.7)
MCV: 80 fL (ref 79–97)
Monocytes Absolute: 0.8 10*3/uL (ref 0.1–0.9)
Monocytes: 7 %
Neutrophils Absolute: 6.1 10*3/uL (ref 1.4–7.0)
Neutrophils: 57 %
Platelets: 324 10*3/uL (ref 150–450)
RBC: 4.53 x10E6/uL (ref 3.77–5.28)
RDW: 14.1 % (ref 11.7–15.4)
RPR Ser Ql: NONREACTIVE
Rh Factor: POSITIVE
Rubella Antibodies, IGG: <0.9 index — ABNORMAL LOW (ref >0.99)
WBC: 10.6 10*3/uL (ref 3.4–10.8)

## 2019-04-10 LAB — URINE CULTURE, OB REFLEX

## 2019-04-10 LAB — URINE CYTOLOGY ANCILLARY ONLY
Chlamydia: NEGATIVE
Neisseria Gonorrhea: NEGATIVE
Trichomonas: NEGATIVE

## 2019-04-10 LAB — CULTURE, OB URINE

## 2019-04-13 NOTE — BH Specialist Note (Signed)
Integrated Behavioral Health via Telemedicine Video Visit  04/13/2019 Glade StanfordKazual Laparis-Lajea Mraz [id number]  Number of Integrated Behavioral Health visits: 1 Session Start time: 8:23  Session End time: 9:15 Total time: 50 minutes  Referring Provider: Coral Ceoharles Harper, MD  Type of Visit: Video [facility]Family [facility] Provider location: [facility] All persons participating in visit: Patient Cala BradfordKazual Simenson and [facility] Jamie McMannes   Confirmed patient's address: Yes  Confirmed patient's phone number: Yes  Any changes to demographics: No   Confirmed patient's insurance: Yes  Any changes to patient's insurance: No   Discussed confidentiality: Yes   I connected with Harlym Laparis-Lajea Beltran by a video enabled telemedicine application and verified that I am speaking with the correct person using two identifiers.     I discussed the limitations of evaluation and management by telemedicine and the availability of in person appointments.  I discussed that the purpose of this visit is to provide behavioral health care while limiting exposure to the novel coronavirus.   Discussed there is a possibility of technology failure and discussed alternative modes of communication if that failure occurs.  I discussed that engaging in this video visit, they consent to the provision of behavioral healthcare and the services will be billed under their insurance.  Patient and/or legal guardian expressed understanding and consented to video visit: Yes   PRESENTING CONCERNS: Patient and/or family reports the following symptoms/concerns: Pt states her primary symptoms are fatigue, sleep difficulty, irritability and feeling bad about herself that she attributes to life stress( lack of emotional support from family, eviction notice, working as both a LawyerCNA and at [employer], and  Worry  about FOB(uninsured; also experiencing depression).  Duration of problem: Current pregnancy increase; hx of depression/anxiety  as a teen; Severity of problem: moderate  STRENGTHS (Protective Factors/Coping Skills): Resilience  GOALS ADDRESSED: Patient will: 1.  Reduce symptoms of: anxiety, depression and stress  2.  Increase knowledge and/or ability of: healthy habits and stress reduction  3.  Demonstrate ability to: Increase healthy adjustment to current life circumstances and Increase adequate support systems for patient/family  INTERVENTIONS: Interventions utilized:  Solution-Focused Strategies, Psychoeducation and/or Health Education and Link to [facility] Standardized Assessments completed: GAD-7 and PHQ 9  ASSESSMENT: Patient currently experiencing Adjustment disorder with mixed depression and anxiety.   Patient may benefit from psychoeducation and brief therapeutic interventions regarding coping with symptoms of depression and anxiety. Marland Kitchen.  PLAN: 1. Follow up with behavioral health clinician on : One week 2. Behavioral recommendations:  -Continue taking prenatal vitamin daily -Prioritize healthy sleep nightly for the next week; begin using journal and sleep sounds at bedtime for improved family sleep -Consider community resources discussed for additional family support 3. Referral(s): Integrated Art gallery managerBehavioral Health Services (In Clinic) and [facility] Resources:  Housing and PCP for FOB, new mom and dad support for postpartum  I discussed the assessment and treatment plan with the patient and/or parent/guardian. They were provided an opportunity to ask questions and all were answered. They agreed with the plan and demonstrated an understanding of the instructions.   They were advised to call back or seek an in-person evaluation if the symptoms worsen or if the condition fails to improve as anticipated.  Valetta CloseJamie C McMannes  Depression screen [facility] 2/9 04/14/2019 08/01/2016 03/15/2016  Decreased Interest 1 0 0  Down, Depressed, Hopeless 1 0 0  PHQ - 2 Score 2 0 0  Altered sleeping 3 0 1  Tired,  decreased energy 3 0 1  Change in appetite  0 0 1  Feeling bad or failure about yourself  3 0 0  Trouble concentrating 0 0 1  Moving slowly or fidgety/restless - 0 0  Suicidal thoughts 0 0 0  PHQ-9 Score 11 0 4   GAD 7 : Generalized Anxiety Score 04/14/2019 04/08/2019 08/01/2016 03/15/2016  Nervous, Anxious, on Edge 1 0 0 1  Control/stop worrying 1 0 0 0  Worry too much - different things 1 0 0 3  Trouble relaxing 1 0 0 2  Restless 0 0 0 1  Easily annoyed or irritable 3 1 0 2  Afraid - awful might happen 0 0 0 0  Total GAD 7 Score 7 1 0 9  Anxiety Difficulty - Not difficult at all - -

## 2019-04-14 ENCOUNTER — Telehealth (INDEPENDENT_AMBULATORY_CARE_PROVIDER_SITE_OTHER): Payer: 59 | Admitting: Clinical

## 2019-04-14 ENCOUNTER — Other Ambulatory Visit: Payer: Self-pay

## 2019-04-14 DIAGNOSIS — F4323 Adjustment disorder with mixed anxiety and depressed mood: Secondary | ICD-10-CM

## 2019-04-20 ENCOUNTER — Telehealth: Payer: Self-pay | Admitting: Family Medicine

## 2019-04-20 NOTE — Telephone Encounter (Signed)
Spoke with patient about her appointment on 8/24 @ 9:15. Patient instructed that the appointment is a mychart visit. Patient verbalized she has the app downloaded

## 2019-04-21 ENCOUNTER — Telehealth (INDEPENDENT_AMBULATORY_CARE_PROVIDER_SITE_OTHER): Payer: 59 | Admitting: Clinical

## 2019-04-21 ENCOUNTER — Other Ambulatory Visit: Payer: Self-pay

## 2019-04-21 DIAGNOSIS — F4323 Adjustment disorder with mixed anxiety and depressed mood: Secondary | ICD-10-CM

## 2019-04-21 NOTE — BH Specialist Note (Signed)
Integrated Behavioral Health Visit via Telemedicine (Telephone)  04/21/2019 Traci Castro [id number]   Session Start time: 9:19  Session End time: 10:01 Total time: 30 minutes  Referring Provider: Baltazar Najjar, MD Type of Visit: Telephonic Patient location: Home [facility] Provider location: [facility] All persons participating in visit: Patient Female [place] and [place]  Confirmed patient's address: Yes  Confirmed patient's phone number: Yes  Any changes to demographics: No   Confirmed patient's insurance: Yes  Any changes to patient's insurance: No   Discussed confidentiality: At previous visit   The following statements were read to the patient and/or legal guardian that are established with the [facility] Provider.  "The purpose of this phone visit is to provide behavioral health care while limiting exposure to the coronavirus (COVID19).  There is a possibility of technology failure and discussed alternative modes of communication if that failure occurs."  "By engaging in this telephone visit, you consent to the provision of healthcare.  Additionally, you authorize for your insurance to be billed for the services provided during this telephone visit."   Patient and/or legal guardian consented to telephone visit: Yes   PRESENTING CONCERNS: Patient and/or family reports the following symptoms/concerns: Pt states her primary concern today is feeling "a little down, less energy" and "emotional ways hit", with ongoing issues finding housing, while staying in a hotel. Pt would like to improve her sleep, find housing, have both her doula and FOB with her at birth, and prevent depression postpartum.  Duration of problem: Increase in pregnancy; Severity of problem: moderate  STRENGTHS (Protective Factors/Coping Skills): Resilience  GOALS ADDRESSED: Patient will: 1.  Reduce symptoms of: anxiety, depression and stress  2.  Increase knowledge and/or ability  of: stress reduction  3.  Demonstrate ability to: Increase healthy adjustment to current life circumstances  INTERVENTIONS: Interventions utilized:  Solution-Focused Strategies Standardized Assessments completed: Not Needed  ASSESSMENT: Patient currently experiencing Adjustment disorder with mixed depression and anxiety.   Patient may benefit from continued brief therapeutic interventions regarding coping with symptoms of depression and anxiety.  PLAN: 1. Follow up with behavioral health clinician on : One month 2. Behavioral recommendations:  -Discuss medications for improved sleep with medical provider at visit on 04/30/2019 -Share and view [place] with FOB and birth doula  -Continue taking prenatal vitamin daily -Continue using journal daily at bedtime -Continue working with [employer] to find appropriate housing 3. Referral(s): [place] (In Clinic)  [place]

## 2019-04-24 ENCOUNTER — Encounter: Payer: Self-pay | Admitting: Certified Nurse Midwife

## 2019-04-30 ENCOUNTER — Ambulatory Visit (INDEPENDENT_AMBULATORY_CARE_PROVIDER_SITE_OTHER): Payer: 59 | Admitting: Certified Nurse Midwife

## 2019-04-30 ENCOUNTER — Other Ambulatory Visit: Payer: 59

## 2019-04-30 ENCOUNTER — Other Ambulatory Visit: Payer: Self-pay

## 2019-04-30 VITALS — BP 122/71 | HR 87 | Wt 296.7 lb

## 2019-04-30 DIAGNOSIS — O99891 Other specified diseases and conditions complicating pregnancy: Secondary | ICD-10-CM

## 2019-04-30 DIAGNOSIS — O99213 Obesity complicating pregnancy, third trimester: Secondary | ICD-10-CM

## 2019-04-30 DIAGNOSIS — O99210 Obesity complicating pregnancy, unspecified trimester: Secondary | ICD-10-CM

## 2019-04-30 DIAGNOSIS — O9989 Other specified diseases and conditions complicating pregnancy, childbirth and the puerperium: Secondary | ICD-10-CM

## 2019-04-30 DIAGNOSIS — Z23 Encounter for immunization: Secondary | ICD-10-CM

## 2019-04-30 DIAGNOSIS — Z283 Underimmunization status: Secondary | ICD-10-CM

## 2019-04-30 DIAGNOSIS — O09213 Supervision of pregnancy with history of pre-term labor, third trimester: Secondary | ICD-10-CM

## 2019-04-30 DIAGNOSIS — O09899 Supervision of other high risk pregnancies, unspecified trimester: Secondary | ICD-10-CM

## 2019-04-30 DIAGNOSIS — Z3A28 28 weeks gestation of pregnancy: Secondary | ICD-10-CM

## 2019-04-30 DIAGNOSIS — Z3480 Encounter for supervision of other normal pregnancy, unspecified trimester: Secondary | ICD-10-CM

## 2019-04-30 DIAGNOSIS — O26893 Other specified pregnancy related conditions, third trimester: Secondary | ICD-10-CM

## 2019-04-30 MED ORDER — FAMOTIDINE 20 MG PO TABS: 20.0000 mg | ORAL_TABLET | Freq: Two times a day (BID) | ORAL | 0 refills | Status: DC

## 2019-04-30 MED ORDER — ASPIRIN EC 81 MG PO TBEC: 81.0000 mg | DELAYED_RELEASE_TABLET | Freq: Every day | ORAL | 0 refills | Status: DC

## 2019-04-30 NOTE — Progress Notes (Signed)
ROB/GTT/TDAP/FLU.  Reports no complains today.

## 2019-04-30 NOTE — Telephone Encounter (Signed)
Please review for refill.  

## 2019-04-30 NOTE — Progress Notes (Signed)
   PRENATAL VISIT NOTE  Subjective:  Traci Castro is a 23 y.o. [phone] at 5w1dbeing seen today for ongoing prenatal care.  She is currently monitored for the following issues for this high-risk pregnancy and has BMI 45.0-49.9, adult ([place]; [name], antepartum; [name]; Hypotension, unspecified; Obesity during pregnancy, antepartum; [name], antepartum; [name]; [name], antepartum; and [name], currently pregnant on their problem list.  Patient reports no complaints.  Contractions: Not present. Vag. Bleeding: None.  Movement: Present. Denies leaking of fluid.   The following portions of the patient's history were reviewed and updated as appropriate: allergies, current medications, past family history, past medical history, past social history, past surgical history and problem list.   Objective:   Vitals:   04/30/19 0924  BP: 122/71  Pulse: 87  Weight: 296 lb 11.2 oz (134.6 kg)    Fetal Status: Fetal Heart Rate (bpm): 145 Fundal Height: 33 cm Movement: Present     General:  Alert, oriented and cooperative. Patient is in no acute distress.  Skin: Skin is warm and dry. No rash noted.   Cardiovascular: Normal heart rate noted  Respiratory: Normal respiratory effort, no problems with respiration noted  Abdomen: Soft, gravid, appropriate for gestational age.  Pain/Pressure: Present     Pelvic: Cervical exam deferred        Extremities: Normal range of motion.  Edema: None  Mental Status: Normal mood and affect. Normal behavior. Normal judgment and thought content.   Assessment and Plan:  Pregnancy: [id number] 266w1d. [name], antepartum - Patient doing well, no complaints - Anticipatory guidance on upcoming appointments  - Routine prenatal care  -  Tdap vaccine greater than or equal to 7yo IM - Flu Vaccine QUAD 36+ mos IM (Fluarix, Quad PF) - Glucose Tolerance, 2 Hours w/1 Hour - CBC - HIV antibody (with reflex) - RPR  2. Obesity during pregnancy, antepartum - f/u US[place]oreacheduled for 9/8 - fetal growth  - continue BASA   3. [name], antepartum - MMR PP   4. [name], currently pregnant - Induced preterm d/t fetal bradycardia   Preterm labor symptoms and general obstetric precautions including but not limited to vaginal bleeding, contractions, leaking of fluid and fetal movement were reviewed in detail with the patient. Please refer to After Visit Summary for other counseling recommendations.   Return in about 4 weeks (around 05/28/2019) for [place] Future Appointments  Date Time Provider [place]/03[place]  2:15 PM WH[place] BranchURSE WH[place] Park[place]  05/05/2019  2:15 PM [place] WH-MFCUS MFC-US  05/19/2019  9:45 AM [place]/08/2018  3:00 PM RoLajean ManesCNKruppone    VeLajean ManesCNM

## 2019-04-30 NOTE — Patient Instructions (Signed)

## 2019-05-01 LAB — RPR: RPR Ser Ql: NONREACTIVE

## 2019-05-01 LAB — CBC
Hematocrit: 35.3 % (ref 34.0–46.6)
Hemoglobin: 11.1 g/dL (ref 11.1–15.9)
MCH: 26.2 pg — ABNORMAL LOW (ref 26.6–33.0)
MCHC: 31.4 g/dL — ABNORMAL LOW (ref 31.5–35.7)
MCV: 83 fL (ref 79–97)
Platelets: 294 10*3/uL (ref 150–450)
RBC: 4.24 x10E6/uL (ref 3.77–5.28)
RDW: 13.8 % (ref 11.7–15.4)
WBC: 9.9 10*3/uL (ref 3.4–10.8)

## 2019-05-01 LAB — GLUCOSE TOLERANCE, 2 HOURS W/ 1HR
Glucose, 1 hour: 95 mg/dL (ref 65–179)
Glucose, 2 hour: 80 mg/dL (ref 65–152)
Glucose, Fasting: 82 mg/dL (ref 65–91)

## 2019-05-01 LAB — HIV ANTIBODY (ROUTINE TESTING W REFLEX): HIV Screen 4th Generation wRfx: NONREACTIVE

## 2019-05-05 ENCOUNTER — Encounter (HOSPITAL_COMMUNITY): Payer: Self-pay | Admitting: *Deleted

## 2019-05-05 ENCOUNTER — Other Ambulatory Visit (HOSPITAL_COMMUNITY): Payer: Self-pay | Admitting: *Deleted

## 2019-05-05 ENCOUNTER — Ambulatory Visit (HOSPITAL_COMMUNITY)
Admission: RE | Admit: 2019-05-05 | Discharge: 2019-05-05 | Disposition: A | Discharge status: Home / Self Care | Payer: 59 | Source: Ambulatory Visit | Attending: Maternal & Fetal Medicine | Admitting: Maternal & Fetal Medicine

## 2019-05-05 ENCOUNTER — Other Ambulatory Visit: Payer: Self-pay

## 2019-05-05 ENCOUNTER — Ambulatory Visit (HOSPITAL_COMMUNITY): Payer: 59 | Admitting: *Deleted

## 2019-05-05 DIAGNOSIS — Z3A28 28 weeks gestation of pregnancy: Secondary | ICD-10-CM | POA: Diagnosis not present

## 2019-05-05 DIAGNOSIS — Z3480 Encounter for supervision of other normal pregnancy, unspecified trimester: Secondary | ICD-10-CM

## 2019-05-05 DIAGNOSIS — O09293 Supervision of pregnancy with other poor reproductive or obstetric history, third trimester: Secondary | ICD-10-CM

## 2019-05-05 DIAGNOSIS — O0933 Supervision of pregnancy with insufficient antenatal care, third trimester: Secondary | ICD-10-CM

## 2019-05-05 DIAGNOSIS — O99213 Obesity complicating pregnancy, third trimester: Secondary | ICD-10-CM

## 2019-05-05 DIAGNOSIS — O09892 Supervision of other high risk pregnancies, second trimester: Secondary | ICD-10-CM | POA: Insufficient documentation

## 2019-05-05 DIAGNOSIS — Z362 Encounter for other antenatal screening follow-up: Secondary | ICD-10-CM | POA: Diagnosis not present

## 2019-05-05 DIAGNOSIS — O09893 Supervision of other high risk pregnancies, third trimester: Secondary | ICD-10-CM

## 2019-05-05 DIAGNOSIS — O09219 Supervision of pregnancy with history of pre-term labor, unspecified trimester: Secondary | ICD-10-CM | POA: Diagnosis not present

## 2019-05-05 DIAGNOSIS — O09213 Supervision of pregnancy with history of pre-term labor, third trimester: Secondary | ICD-10-CM | POA: Diagnosis not present

## 2019-05-05 DIAGNOSIS — Z6841 Body Mass Index (BMI) 40.0 and over, adult: Secondary | ICD-10-CM

## 2019-05-05 DIAGNOSIS — O09899 Supervision of other high risk pregnancies, unspecified trimester: Secondary | ICD-10-CM

## 2019-05-18 ENCOUNTER — Telehealth: Payer: Self-pay

## 2019-05-18 NOTE — Telephone Encounter (Signed)
TC from pt regarding Diarrhea x 3 days Pt states contractions that are not consistent were onset today. Pt denies any LOF,no Fever, no Chills, no pain, no VB, +FM Pt has not tried any comfort measures for diarrhea Patient made aware of safe OTC comfort measures as in NOB packet Drink clear fluids next 24hr, no Diary etc or spicy foods. Pt made aware for assurance or if sx's  Become worse she may report to MAU for an evaluation.  Pt voiced understanding.

## 2019-05-19 ENCOUNTER — Other Ambulatory Visit: Payer: Self-pay

## 2019-05-19 ENCOUNTER — Encounter: Payer: Self-pay | Admitting: Obstetrics and Gynecology

## 2019-05-19 ENCOUNTER — Ambulatory Visit: Payer: 59 | Admitting: Clinical

## 2019-05-19 DIAGNOSIS — Z5329 Procedure and treatment not carried out because of patient's decision for other reasons: Secondary | ICD-10-CM

## 2019-05-19 DIAGNOSIS — Z91199 Patient's noncompliance with other medical treatment and regimen due to unspecified reason: Secondary | ICD-10-CM

## 2019-05-19 NOTE — BH Specialist Note (Signed)
Pt did not arrive to video visit and did not answer the phone, so Left HIPPA-compliant message to call back Roselyn Reef from [facility] [employer] [facility] at [phone], and left MyChart message for patient.   [place] via Telemedicine Video Visit  05/19/2019 Traci Castro [id number]   Traci Castro

## 2019-05-28 ENCOUNTER — Encounter: Payer: Self-pay | Admitting: Certified Nurse Midwife

## 2019-05-28 ENCOUNTER — Other Ambulatory Visit: Payer: Self-pay

## 2019-05-28 ENCOUNTER — Telehealth (INDEPENDENT_AMBULATORY_CARE_PROVIDER_SITE_OTHER): Payer: 59 | Admitting: Certified Nurse Midwife

## 2019-05-28 DIAGNOSIS — O99213 Obesity complicating pregnancy, third trimester: Secondary | ICD-10-CM

## 2019-05-28 DIAGNOSIS — O09892 Supervision of other high risk pregnancies, second trimester: Secondary | ICD-10-CM

## 2019-05-28 DIAGNOSIS — Z3A32 32 weeks gestation of pregnancy: Secondary | ICD-10-CM

## 2019-05-28 DIAGNOSIS — O26893 Other specified pregnancy related conditions, third trimester: Secondary | ICD-10-CM

## 2019-05-28 DIAGNOSIS — O99891 Other specified diseases and conditions complicating pregnancy: Secondary | ICD-10-CM

## 2019-05-28 DIAGNOSIS — Z3480 Encounter for supervision of other normal pregnancy, unspecified trimester: Secondary | ICD-10-CM

## 2019-05-28 DIAGNOSIS — O99210 Obesity complicating pregnancy, unspecified trimester: Secondary | ICD-10-CM

## 2019-05-28 DIAGNOSIS — Z283 Underimmunization status: Secondary | ICD-10-CM

## 2019-05-28 DIAGNOSIS — N898 Other specified noninflammatory disorders of vagina: Secondary | ICD-10-CM

## 2019-05-28 DIAGNOSIS — Z2839 Other underimmunization status: Secondary | ICD-10-CM

## 2019-05-28 DIAGNOSIS — O09893 Supervision of other high risk pregnancies, third trimester: Secondary | ICD-10-CM

## 2019-05-28 MED ORDER — METRONIDAZOLE 500 MG PO TABS: 500.0000 mg | ORAL_TABLET | Freq: Two times a day (BID) | ORAL | 0 refills | Status: DC

## 2019-05-28 NOTE — Progress Notes (Signed)
TELEHEALTH OBSTETRICS PRENATAL VIRTUAL VIDEO VISIT ENCOUNTER NOTE  Provider location: [facility] [place]   I connected with Glade Stanford on 05/28/19 at  3:14 PM EDT by MyChart Video Encounter at home and verified that I am speaking with the correct person using two identifiers.   I discussed the limitations, risks, security and privacy concerns of performing an evaluation and management service virtually and the availability of in person appointments. I also discussed with the patient that there may be a patient responsible charge related to this service. The patient expressed understanding and agreed to proceed. Subjective:  Traci Castro is a 23 y.o. [id number] at [redacted]w[redacted]d being seen today for ongoing prenatal care.  She is currently monitored for the following issues for this high-risk pregnancy and has BMI 45.0-49.9, adult (HCC); [name], antepartum; [name]; Hypotension, unspecified; Obesity during pregnancy, antepartum; [name], antepartum; [name]; [name], antepartum; and [name], currently pregnant on their problem list.  Patient reports vaginal discharge and decreased fetal movement.  Contractions: Irregular. Vag. Bleeding: None.  Movement: Present. Denies any leaking of fluid.   The following portions of the patient's history were reviewed and updated as appropriate: allergies, current medications, past family history, past medical history, past social history, past surgical history and problem list.   Objective:  There were no vitals filed for this visit.  Fetal Status:     Movement: Present     General:  Alert, oriented and cooperative. Patient is in no acute distress.  Respiratory: Normal respiratory effort, no problems with respiration noted    Mental Status: Normal mood and affect. Normal behavior. Normal judgment and thought content.  Rest of physical exam deferred due to type of encounter  Imaging: [place] Mfm Ob Follow Up  Result Date: 05/06/2019 ----------------------------------------------------------------------  OBSTETRICS REPORT                        (Signed Final 05/06/2019 06:39 am) ---------------------------------------------------------------------- Patient Info  ID #:       [id number]                          D.O.B.:  11-29-1995 (22 yrs)  Name:       Traci Castro             Visit Date: 05/05/2019 02:44 pm ---------------------------------------------------------------------- Performed By  Performed By:     Hurman Horn          Ref. Address:      Faculty                    RDMS  Attending:        Lin Landsman      [facility]                                        [facility]  Referred By:      Rudean Curt  Rickardo Brinegar CNM ---------------------------------------------------------------------- Orders   #  Description                          Code         Ordered By   1  [place] MFM OB FOLLOW UP                  [id number]     Lin Landsman  ----------------------------------------------------------------------   #  Order #                    Accession #                 Episode #   1  [id number]                  [id number]                  [id number]  ---------------------------------------------------------------------- Indications   Poor obstetrical history (Hypotension prior    O09.299   pregnancy)   Poor obstetric history: Previous preterm       O09.219   delivery, antepartum (Fetal Bradycardia 36   weeks)   Short interval between pregancies, 3rd         O09.893   trimester   Obesity complicating pregnancy, third          O99.213   trimester (BMI 48)   Late to prenatal care, third trimester         O09.33   [redacted] weeks  gestation of pregnancy                Z3A.28  ---------------------------------------------------------------------- Vital Signs                                                 Height:        5'3" ---------------------------------------------------------------------- Fetal Evaluation  Num Of Fetuses:          1  Fetal Heart Rate(bpm):   146  Cardiac Activity:        Observed  Presentation:            Cephalic  Placenta:                Posterior  Amniotic Fluid  AFI FV:      Within normal limits  AFI Sum(cm)     %Tile       Largest Pocket(cm)  16.77           62          5.2  RUQ(cm)       RLQ(cm)       LUQ(cm)        LLQ(cm)  3.58          4.43          5.2            3.56 ---------------------------------------------------------------------- Biometry  BPD:      69.9  mm     G. Age:  28w 1d         16  %    CI:        74.68   %    70 - 86                                                          FL/HC:       20.3  %    19.6 - 20.8  HC:      256.7  mm     G. Age:  27w 6d          4  %    HC/AC:       1.00       0.99 - 1.21  AC:      257.3  mm     G. Age:  29w 6d         74  %    FL/BPD:      74.4  %    71 - 87  FL:         52  mm     G. Age:  27w 5d         10  %    FL/AC:       20.2  %    20 - 24  HUM:        50  mm     G. Age:  29w 2d         53  %  LV:        3.6  mm  Est. FW:    1297   gm   2 lb 14 oz      37  % ---------------------------------------------------------------------- OB History  Gravidity:    4         Term:   1        Prem:   1        SAB:   0  TOP:          1       Ectopic:  0        Living: 2 ---------------------------------------------------------------------- Gestational Age  LMP:           28w 6d        Date:  10/15/18                 EDD:   07/22/19  U/S Today:     28w 3d                                        EDD:   07/25/19  Best:          28w 6d     Det. By:  LMP  (10/15/18)          EDD:   07/22/19 ---------------------------------------------------------------------- Anatomy  Cranium:                Appears normal         Aortic Arch:            Appears normal  Cavum:                 Appears normal  Ductal Arch:            Not well visualized  Ventricles:            Appears normal         Diaphragm:              Appears normal  Choroid Plexus:        Previously seen        Stomach:                Appears normal, left                                                                        sided  Cerebellum:            Previously seen        Abdomen:                Appears normal  Posterior Fossa:       Previously seen        Abdominal Wall:         Previously seen  Nuchal Fold:           Not applicable (>20    Cord Vessels:           Previously seen                         wks GA)  Face:                  Appears normal         Kidneys:                Appear normal                         (orbits and profile)  Lips:                  Appears normal         Bladder:                Appears normal  Thoracic:              Appears normal         Spine:                  Ltd views no                                                                        intracranial signs of  NTD  Heart:                 Appears normal         Upper Extremities:      Previously seen                         (4CH, axis, and                         situs)  RVOT:                  Appears normal         Lower Extremities:      Previously seen  LVOT:                  Appears normal  Other:  Parents do not wish to know sex of fetus. Nasal bone visualized.          Heels visualized. Technically difficult due to maternal habitus and          fetal position. ---------------------------------------------------------------------- Impression  Normal interval growth.  BMI >40  Suboptimal views of the fetal anatomy again seen ---------------------------------------------------------------------- Recommendations  Follow up growth in 4 weeks.  ----------------------------------------------------------------------               Lin Landsman, MD Electronically Signed Final Report   05/06/2019 06:39 am ----------------------------------------------------------------------   Assessment and Plan:  Pregnancy: [id number] at [redacted]w[redacted]d 1. [name], antepartum - Patient reports she is unable to take BP right now as she is in the car, will take when she gets home  - Patient reports DFM for the past 2 weeks- feels fetal movement it is less than usual, "baby will move in the morning then not move rest of day" - Discussed with patient tonight she needs to perform fetal kick counts, eat dinner after dinner turn off all distractions sit down and count movement. If she has 10 movements in 2 hours then passed. If she does not then drink something cold or juice, if less than 4 movements the next hour then she needs to go to MAU tonight for further evaluation. Patient verbalizes understanding  - Anticipatory guidance on upcoming appointments  - [place] scheduled for 10/6  2. [name], antepartum - MMR PP  3. Obesity during pregnancy, antepartum - continue BASA  4. [name], antepartum  5. Vaginal discharge during pregnancy in third trimester - Patient reports white thin discharge with odor that started occurring last week  - Has not tried anything at home for it, will treat for BV based on clinical symptoms  - metroNIDAZOLE (FLAGYL) 500 MG tablet; Take 1 tablet (500 mg total) by mouth 2 (two) times daily.  Dispense: 14 tablet; Refill: 0  Preterm labor symptoms and general obstetric precautions including but not limited to vaginal bleeding, contractions, leaking of fluid and fetal movement were reviewed in detail with the patient. I discussed the assessment and treatment plan with the patient. The patient was provided an opportunity to ask  questions and all were answered. The patient agreed with the plan and demonstrated an understanding of the instructions. The patient was advised to call back or seek an in-person office evaluation/go to MAU at [facility] for any urgent or concerning symptoms. Please refer to After Visit Summary for other counseling recommendations.   I provided  10 minutes of face-to-face time during this encounter.  Return in about 2 weeks (around 06/11/2019) for ROB-in person .  Future Appointments  Date Time Provider Department Center  06/02/2019  2:10 PM WH-MFC NURSE WH-MFC MFC-US  06/02/2019  2:15 PM WH-MFC [place] 4 WH-MFCUS MFC-US  06/10/2019  4:15 PM Rasch, Harolyn Rutherford, NP [facility] None    Sharyon Cable, CNM [facility] for [employer], [facility]

## 2019-05-28 NOTE — Progress Notes (Signed)
Pt is unable to check BP during visit, will check later today. Discuss baby movement.

## 2019-06-02 ENCOUNTER — Encounter (HOSPITAL_COMMUNITY): Payer: Self-pay

## 2019-06-02 ENCOUNTER — Other Ambulatory Visit (HOSPITAL_COMMUNITY): Payer: Self-pay | Admitting: *Deleted

## 2019-06-02 ENCOUNTER — Ambulatory Visit (HOSPITAL_COMMUNITY): Payer: 59 | Admitting: *Deleted

## 2019-06-02 ENCOUNTER — Other Ambulatory Visit: Payer: Self-pay

## 2019-06-02 ENCOUNTER — Ambulatory Visit (HOSPITAL_COMMUNITY)
Admission: RE | Admit: 2019-06-02 | Discharge: 2019-06-02 | Disposition: A | Discharge status: Home / Self Care | Payer: 59 | Source: Ambulatory Visit | Attending: Obstetrics and Gynecology | Admitting: Obstetrics and Gynecology

## 2019-06-02 DIAGNOSIS — O09899 Supervision of other high risk pregnancies, unspecified trimester: Secondary | ICD-10-CM

## 2019-06-02 DIAGNOSIS — Z6841 Body Mass Index (BMI) 40.0 and over, adult: Secondary | ICD-10-CM | POA: Insufficient documentation

## 2019-06-02 DIAGNOSIS — Z3480 Encounter for supervision of other normal pregnancy, unspecified trimester: Secondary | ICD-10-CM

## 2019-06-02 DIAGNOSIS — O99213 Obesity complicating pregnancy, third trimester: Secondary | ICD-10-CM | POA: Diagnosis not present

## 2019-06-02 DIAGNOSIS — Z3A32 32 weeks gestation of pregnancy: Secondary | ICD-10-CM

## 2019-06-02 DIAGNOSIS — O09893 Supervision of other high risk pregnancies, third trimester: Secondary | ICD-10-CM

## 2019-06-02 DIAGNOSIS — O0933 Supervision of pregnancy with insufficient antenatal care, third trimester: Secondary | ICD-10-CM | POA: Diagnosis not present

## 2019-06-02 DIAGNOSIS — O09293 Supervision of pregnancy with other poor reproductive or obstetric history, third trimester: Secondary | ICD-10-CM | POA: Diagnosis not present

## 2019-06-02 DIAGNOSIS — Z362 Encounter for other antenatal screening follow-up: Secondary | ICD-10-CM | POA: Diagnosis not present

## 2019-06-02 DIAGNOSIS — O09892 Supervision of other high risk pregnancies, second trimester: Secondary | ICD-10-CM

## 2019-06-02 DIAGNOSIS — O368190 Decreased fetal movements, unspecified trimester, not applicable or unspecified: Secondary | ICD-10-CM | POA: Insufficient documentation

## 2019-06-02 NOTE — Procedures (Signed)
Traci Castro 1995/12/27 [redacted]w[redacted]d  Fetus A Non-Stress Test Interpretation for 06/02/19  Indication: Decreased Fetal Movement  Fetal Heart Rate A Mode: External Baseline Rate (A): 125 bpm Variability: Moderate Accelerations: 10 x 10, 15 x 15 Decelerations: None Multiple birth?: No  Uterine Activity Mode: Palpation, Toco Contraction Frequency (min): U/I Contraction Duration (sec): 20-50 Contraction Quality: Mild(Pt denies feeling any) Resting Tone Palpated: Relaxed Resting Time: Adequate  Interpretation (Fetal Testing) Nonstress Test Interpretation: Reactive Comments: Reviewed tracing with Dr. Annamaria Boots

## 2019-06-03 ENCOUNTER — Other Ambulatory Visit: Payer: Self-pay

## 2019-06-03 DIAGNOSIS — Z20822 Contact with and (suspected) exposure to covid-19: Secondary | ICD-10-CM

## 2019-06-04 LAB — NOVEL CORONAVIRUS, NAA: SARS-CoV-2, NAA: NOT DETECTED

## 2019-06-09 ENCOUNTER — Ambulatory Visit (HOSPITAL_COMMUNITY): Payer: 59 | Attending: Obstetrics | Admitting: *Deleted

## 2019-06-09 ENCOUNTER — Ambulatory Visit (HOSPITAL_COMMUNITY): Payer: 59 | Admitting: *Deleted

## 2019-06-09 ENCOUNTER — Other Ambulatory Visit: Payer: Self-pay

## 2019-06-09 ENCOUNTER — Encounter (HOSPITAL_COMMUNITY): Payer: Self-pay | Admitting: *Deleted

## 2019-06-09 DIAGNOSIS — Z3689 Encounter for other specified antenatal screening: Secondary | ICD-10-CM | POA: Diagnosis not present

## 2019-06-09 DIAGNOSIS — O4703 False labor before 37 completed weeks of gestation, third trimester: Secondary | ICD-10-CM | POA: Diagnosis not present

## 2019-06-09 DIAGNOSIS — O09899 Supervision of other high risk pregnancies, unspecified trimester: Secondary | ICD-10-CM

## 2019-06-09 DIAGNOSIS — O368130 Decreased fetal movements, third trimester, not applicable or unspecified: Secondary | ICD-10-CM | POA: Diagnosis not present

## 2019-06-09 DIAGNOSIS — Z3A33 33 weeks gestation of pregnancy: Secondary | ICD-10-CM | POA: Insufficient documentation

## 2019-06-09 DIAGNOSIS — O368190 Decreased fetal movements, unspecified trimester, not applicable or unspecified: Secondary | ICD-10-CM

## 2019-06-09 DIAGNOSIS — Z3480 Encounter for supervision of other normal pregnancy, unspecified trimester: Secondary | ICD-10-CM

## 2019-06-09 DIAGNOSIS — O09892 Supervision of other high risk pregnancies, second trimester: Secondary | ICD-10-CM

## 2019-06-09 NOTE — Procedures (Signed)
Traci Castro 04-Apr-1996 [redacted]w[redacted]d  Fetus A Non-Stress Test Interpretation for 06/09/19  Indication: Decreased Fetal Movement  Fetal Heart Rate A Mode: External Baseline Rate (A): 130 bpm Variability: Moderate Accelerations: 15 x 15 Decelerations: (1 UC noted with isolated late decel) Multiple birth?: No  Uterine Activity Mode: Palpation, Toco Contraction Frequency (min): 1 UC w/UI Contraction Duration (sec): 10-50 Contraction Quality: Mild Resting Tone Palpated: Relaxed Resting Time: Adequate  Interpretation (Fetal Testing) Nonstress Test Interpretation: Reactive Comments: EFM traciing reviewed by Dr. Gertie Exon

## 2019-06-10 ENCOUNTER — Encounter: Payer: 59 | Admitting: Obstetrics and Gynecology

## 2019-06-16 ENCOUNTER — Encounter (HOSPITAL_COMMUNITY): Payer: Self-pay

## 2019-06-16 ENCOUNTER — Other Ambulatory Visit: Payer: Self-pay

## 2019-06-16 ENCOUNTER — Ambulatory Visit (HOSPITAL_COMMUNITY): Payer: 59 | Admitting: *Deleted

## 2019-06-16 ENCOUNTER — Ambulatory Visit (HOSPITAL_COMMUNITY): Payer: 59 | Attending: Obstetrics | Admitting: *Deleted

## 2019-06-16 DIAGNOSIS — Z3480 Encounter for supervision of other normal pregnancy, unspecified trimester: Secondary | ICD-10-CM

## 2019-06-16 DIAGNOSIS — Z3689 Encounter for other specified antenatal screening: Secondary | ICD-10-CM | POA: Insufficient documentation

## 2019-06-16 DIAGNOSIS — O4703 False labor before 37 completed weeks of gestation, third trimester: Secondary | ICD-10-CM | POA: Insufficient documentation

## 2019-06-16 DIAGNOSIS — Z3A34 34 weeks gestation of pregnancy: Secondary | ICD-10-CM | POA: Insufficient documentation

## 2019-06-16 DIAGNOSIS — O09293 Supervision of pregnancy with other poor reproductive or obstetric history, third trimester: Secondary | ICD-10-CM | POA: Diagnosis not present

## 2019-06-16 DIAGNOSIS — O09899 Supervision of other high risk pregnancies, unspecified trimester: Secondary | ICD-10-CM

## 2019-06-16 DIAGNOSIS — O09892 Supervision of other high risk pregnancies, second trimester: Secondary | ICD-10-CM

## 2019-06-16 NOTE — Procedures (Signed)
Traci Castro 08-07-1996 [redacted]w[redacted]d  Fetus A Non-Stress Test Interpretation for 06/16/19  Indication: poor OB history  Fetal Heart Rate A Mode: External Baseline Rate (A): 120 bpm Variability: Moderate Accelerations: 15 x 15 Decelerations: None Multiple birth?: No  Uterine Activity Mode: Toco Contraction Frequency (min): mild UI noted Contraction Duration (sec): 10-40 Contraction Quality: Mild Resting Tone Palpated: Relaxed Resting Time: Adequate  Interpretation (Fetal Testing) Nonstress Test Interpretation: Reactive Comments: FHR tracing rev'd by Dr. Gertie Exon

## 2019-06-18 ENCOUNTER — Ambulatory Visit (INDEPENDENT_AMBULATORY_CARE_PROVIDER_SITE_OTHER): Payer: 59 | Admitting: Certified Nurse Midwife

## 2019-06-18 ENCOUNTER — Other Ambulatory Visit: Payer: Self-pay

## 2019-06-18 VITALS — BP 110/76 | HR 93 | Wt 301.0 lb

## 2019-06-18 DIAGNOSIS — Z283 Underimmunization status: Secondary | ICD-10-CM

## 2019-06-18 DIAGNOSIS — Z3480 Encounter for supervision of other normal pregnancy, unspecified trimester: Secondary | ICD-10-CM

## 2019-06-18 DIAGNOSIS — O99210 Obesity complicating pregnancy, unspecified trimester: Secondary | ICD-10-CM

## 2019-06-18 DIAGNOSIS — O09899 Supervision of other high risk pregnancies, unspecified trimester: Secondary | ICD-10-CM

## 2019-06-18 DIAGNOSIS — O0933 Supervision of pregnancy with insufficient antenatal care, third trimester: Secondary | ICD-10-CM

## 2019-06-18 DIAGNOSIS — O0932 Supervision of pregnancy with insufficient antenatal care, second trimester: Secondary | ICD-10-CM

## 2019-06-18 DIAGNOSIS — Z3A35 35 weeks gestation of pregnancy: Secondary | ICD-10-CM

## 2019-06-18 DIAGNOSIS — O99213 Obesity complicating pregnancy, third trimester: Secondary | ICD-10-CM

## 2019-06-18 DIAGNOSIS — O99891 Other specified diseases and conditions complicating pregnancy: Secondary | ICD-10-CM

## 2019-06-18 NOTE — Progress Notes (Signed)
ROB with complaints of pressure pt wants cervix check.

## 2019-06-18 NOTE — Progress Notes (Signed)
   PRENATAL VISIT NOTE  Subjective:  Traci Castro is a 23 y.o. [phone] at 85w1dbeing seen today for ongoing prenatal care.  She is currently monitored for the following issues for this low-risk pregnancy and has BMI 45.0-49.9, adult ([place]; [name], antepartum; [name]; Hypotension, unspecified; Obesity during pregnancy, antepartum; [name], antepartum; [name]; [name], antepartum; and [name], currently pregnant on their problem list.  Patient reports pelvic pressure.  Contractions: Irritability. Vag. Bleeding: None.  Movement: Present. Denies leaking of fluid.   The following portions of the patient's history were reviewed and updated as appropriate: allergies, current medications, past family history, past medical history, past social history, past surgical history and problem list.   Objective:   Vitals:   06/18/19 1012  BP: 110/76  Pulse: 93  Weight: (!) 301 lb (136.5 kg)    Fetal Status: Fetal Heart Rate (bpm): 128 Fundal Height: 42 cm Movement: Present  Presentation: Vertex  General:  Alert, oriented and cooperative. Patient is in no acute distress.  Skin: Skin is warm and dry. No rash noted.   Cardiovascular: Normal heart rate noted  Respiratory: Normal respiratory effort, no problems with respiration noted  Abdomen: Soft, gravid, appropriate for gestational age.  Pain/Pressure: Present     Pelvic: Cervical exam performed Dilation: 1.5 Effacement (%): Thick Station: Ballotable  Extremities: Normal range of motion.     Mental Status: Normal mood and affect. Normal behavior. Normal judgment and thought content.   Assessment and Plan:  Pregnancy: [id number] 361w1d. [name], antepartum - Patient doing well, reports pelvic pressure that started  over the past 1-2 days. Request cervical examination  - Vertex presentation found by leopalds which is c/w where pelvic pressure is  - Routine prenatal care  - Anticipatory guidance on upcoming appointments with next appointment including GBS and GC/C- patient verbalizes understanding   2. Obesity during pregnancy, antepartum - Est. FW:    2092  gm    4 lb 10 oz      44  % at 3265w6d 10/6 - plans for follow up fetal growth around 36-37 weeks   3. [name], antepartum - MMR PP   4. [name]  Preterm labor symptoms and general obstetric precautions including but not limited to vaginal bleeding, contractions, leaking of fluid and fetal movement were reviewed in detail with the patient. Please refer to After Visit Summary for other counseling recommendations.    Future Appointments  Date Time Provider DepIvanhoe0/28/2020  2:15 PM [facility] [facility]  06/24/2019  2:20 PM WH-[place] [facility]  06/25/2019  2:55 PM WeiDarlina RumpfNM [facility] None  06/30/2019  2:15 PM WH-[place] [facility]-[place]  06/30/2019  2:30 PM WH[place] [facility]  06/30/2019  3:15 PM WH-[place]-MFCUS MFC-US  07/02/2019  2:30 PM EmlGavin PoundNM [facility] None  07/09/2019  1:30 PM RogLajean ManesNMWillardne    VerLajean ManesNM

## 2019-06-24 ENCOUNTER — Ambulatory Visit (HOSPITAL_COMMUNITY): Payer: 59 | Admitting: *Deleted

## 2019-06-24 ENCOUNTER — Ambulatory Visit (HOSPITAL_COMMUNITY): Payer: 59 | Attending: Obstetrics | Admitting: *Deleted

## 2019-06-24 ENCOUNTER — Other Ambulatory Visit: Payer: Self-pay

## 2019-06-24 ENCOUNTER — Encounter (HOSPITAL_COMMUNITY): Payer: Self-pay

## 2019-06-24 DIAGNOSIS — O368130 Decreased fetal movements, third trimester, not applicable or unspecified: Secondary | ICD-10-CM | POA: Insufficient documentation

## 2019-06-24 DIAGNOSIS — O09892 Supervision of other high risk pregnancies, second trimester: Secondary | ICD-10-CM

## 2019-06-24 DIAGNOSIS — Z3480 Encounter for supervision of other normal pregnancy, unspecified trimester: Secondary | ICD-10-CM

## 2019-06-24 DIAGNOSIS — O09899 Supervision of other high risk pregnancies, unspecified trimester: Secondary | ICD-10-CM

## 2019-06-24 NOTE — Procedures (Signed)
Traci Castro 26-Jan-1996 [redacted]w[redacted]d  Fetus A Non-Stress Test Interpretation for 06/24/19  Indication: Decreased Fetal Movement  Fetal Heart Rate A Mode: External Baseline Rate (A): 120 bpm Variability: Moderate Accelerations: 15 x 15 Decelerations: None Multiple birth?: No  Uterine Activity Mode: Toco Contraction Frequency (min): Occ UC noted Contraction Duration (sec): 60-90 Contraction Quality: Mild Resting Tone Palpated: Relaxed Resting Time: Adequate  Interpretation (Fetal Testing) Nonstress Test Interpretation: Reactive Comments: FHR tracing rev'd by Dr. Gertie Exon

## 2019-06-25 ENCOUNTER — Encounter (HOSPITAL_COMMUNITY): Payer: Self-pay

## 2019-06-25 ENCOUNTER — Ambulatory Visit (INDEPENDENT_AMBULATORY_CARE_PROVIDER_SITE_OTHER): Payer: 59 | Admitting: Certified Nurse Midwife

## 2019-06-25 ENCOUNTER — Encounter: Payer: Self-pay | Admitting: Certified Nurse Midwife

## 2019-06-25 ENCOUNTER — Other Ambulatory Visit: Payer: Self-pay

## 2019-06-25 ENCOUNTER — Other Ambulatory Visit (HOSPITAL_COMMUNITY)
Admission: RE | Admit: 2019-06-25 | Discharge: 2019-06-25 | Disposition: A | Discharge status: Home / Self Care | Payer: 59 | Source: Ambulatory Visit | Attending: Advanced Practice Midwife | Admitting: Advanced Practice Midwife

## 2019-06-25 ENCOUNTER — Inpatient Hospital Stay (HOSPITAL_COMMUNITY)
Admission: AD | Admit: 2019-06-25 | Discharge: 2019-06-25 | Disposition: A | Discharge status: Home / Self Care | Payer: 59 | Attending: Obstetrics and Gynecology | Admitting: Obstetrics and Gynecology

## 2019-06-25 ENCOUNTER — Inpatient Hospital Stay (HOSPITAL_BASED_OUTPATIENT_CLINIC_OR_DEPARTMENT_OTHER): Payer: 59

## 2019-06-25 VITALS — BP 127/78 | HR 92 | Wt 299.7 lb

## 2019-06-25 DIAGNOSIS — Z3689 Encounter for other specified antenatal screening: Secondary | ICD-10-CM | POA: Diagnosis not present

## 2019-06-25 DIAGNOSIS — O99213 Obesity complicating pregnancy, third trimester: Secondary | ICD-10-CM

## 2019-06-25 DIAGNOSIS — O368130 Decreased fetal movements, third trimester, not applicable or unspecified: Secondary | ICD-10-CM

## 2019-06-25 DIAGNOSIS — O0933 Supervision of pregnancy with insufficient antenatal care, third trimester: Secondary | ICD-10-CM | POA: Diagnosis not present

## 2019-06-25 DIAGNOSIS — O09893 Supervision of other high risk pregnancies, third trimester: Secondary | ICD-10-CM | POA: Insufficient documentation

## 2019-06-25 DIAGNOSIS — O09213 Supervision of pregnancy with history of pre-term labor, third trimester: Secondary | ICD-10-CM | POA: Insufficient documentation

## 2019-06-25 DIAGNOSIS — Z3480 Encounter for supervision of other normal pregnancy, unspecified trimester: Secondary | ICD-10-CM

## 2019-06-25 DIAGNOSIS — Z3A36 36 weeks gestation of pregnancy: Secondary | ICD-10-CM

## 2019-06-25 DIAGNOSIS — O09293 Supervision of pregnancy with other poor reproductive or obstetric history, third trimester: Secondary | ICD-10-CM | POA: Insufficient documentation

## 2019-06-25 DIAGNOSIS — O99210 Obesity complicating pregnancy, unspecified trimester: Secondary | ICD-10-CM

## 2019-06-25 NOTE — Progress Notes (Signed)
   PRENATAL VISIT NOTE  Subjective:  Traci Castro is a 23 y.o. [id number] at [redacted]w[redacted]d being seen today for ongoing prenatal care.  She is currently monitored for the following issues for this low-risk pregnancy and has BMI 45.0-49.9, adult ([place]); [name], antepartum; [name]; Hypotension, unspecified; Obesity during pregnancy, antepartum; [name], antepartum; [name]; [name], antepartum; and [name], currently pregnant on their problem list.  Patient reports decreased fetal movement and pelvic pressure.  Contractions: Irregular. Vag. Bleeding: None.  Movement: (!) Decreased. Denies leaking of fluid.   The following portions of the patient's history were reviewed and updated as appropriate: allergies, current medications, past family history, past medical history, past social history, past surgical history and problem list.   Objective:   Vitals:   06/25/19 1352  BP: 127/78  Pulse: 92  Weight: 299 lb 11.2 oz (135.9 kg)    Fetal Status: Fetal Heart Rate (bpm): 143 Fundal Height: 41 cm Movement: (!) Decreased  Presentation: Vertex  General:  Alert, oriented and cooperative. Patient is in no acute distress.  Skin: Skin is warm and dry. No rash noted.   Cardiovascular: Normal heart rate noted  Respiratory: Normal respiratory effort, no problems with respiration noted  Abdomen: Soft, gravid, appropriate for gestational age.  Pain/Pressure: Present     Pelvic: Cervical exam performed Dilation: 2 Effacement (%): Thick Station: -3  Extremities: Normal range of motion.  Edema: None  Mental Status: Normal mood and affect. Normal behavior. Normal judgment and thought content.   Assessment and Plan:  Pregnancy: [id number] at [redacted]w[redacted]d 1. [name], antepartum - Patient  reports DFM and pelvic pressure  - She reports that the pelvic pressure is causing her to have increased urination - educated and discussed stage of pregnancy and that this is normal  - Anticipatory guidance on upcoming appointments  - Strep Gp B NAA - Cervicovaginal ancillary only( [place])  2. Obesity during pregnancy, antepartum - TWG 27lbs during pregnancy   3. Decreased fetal movement affecting management of pregnancy in third trimester, single or unspecified fetus - patient reports DFM over the past 2-3 days  - has felt fetal movement less than 4 times over the course of today  - NST yesterday reactive and reassuring - patient has weekly NST d/t hx  - based on hx with PTD d/t bradycardia, BPP offered to patient  - Patient sent to MAU for BPP, Jorje Guild NP aware of patient coming to MAU   Preterm labor symptoms and general obstetric precautions including but not limited to vaginal bleeding, contractions, leaking of fluid and fetal movement were reviewed in detail with the patient. Please refer to After Visit Summary for other counseling recommendations.   Return in about 2 weeks (around 07/09/2019) for ROB-in person with Merlina Marchena.  Future Appointments  Date Time Provider [place]  06/30/2019  2:15 PM [place] NST [facility] MFC-US  06/30/2019  2:30 PM [place] NURSE [place] MFC-US  06/30/2019  3:15 PM [place] 4 WH-MFCUS MFC-US  07/09/2019  1:30 PM Lajean Manes, CNM [place] None    Lajean Manes, CNM

## 2019-06-25 NOTE — Progress Notes (Signed)
ROB/GBS.  C/o excessive voiding and decreased fetal movement, she had NST yesterday.

## 2019-06-25 NOTE — Patient Instructions (Signed)

## 2019-06-25 NOTE — MAU Note (Signed)
Pt states that she has had decreased fetal movement since yesterday.   Pt last felt baby move was the day before yesterday.   Denies vaginal bleeding or LOF.    Pt increase in frequency of needing to use the bathroom to pee.    Pt reports a little bit of pain and pressure in her pelvic area.

## 2019-06-25 NOTE — MAU Provider Note (Signed)
Chief Complaint:  Decreased Fetal Movement   First Provider Initiated Contact with Patient 06/25/19 1657      HPI: Traci Castro is a 23 y.o. [id number] at [redacted]w[redacted]d by LMP who presents to maternity admissions sent from the office for BPP due to decreased fetal movement and history of similar presentation with her preterm delivery at 36 weeks.  She reports no fetal movement today or yesterday and urinary frequency, no dysuria or cramping/contractions.  There are no other symptoms. She has not tried any treatments. She reports feeling fetal movement when monitors applied today in MAU.  HPI  Past Medical History: Past Medical History:  Diagnosis Date  . Anemia   . Anxiety    no meds  . Bipolar disorder (HCC)   . Depression    no meds  . Gastritis    2 bouts  . H/O seasonal allergies   . Migraine    migraines  . Psoriasis   . SVD (spontaneous vaginal delivery)    x 1  . Vision abnormalities    wears glasses    Past obstetric history: OB History  Gravida Para Term Preterm AB Living  4 2 1 1 1 2   SAB TAB Ectopic Multiple Live Births    1   0 2    # Outcome Date GA Lbr Len/2nd Weight Sex Delivery Anes PTL Lv  4 Current           3 Preterm 07/23/18 [redacted]w[redacted]d 01:45 / 00:02 2950 g M Vag-Spont None  LIV     Birth Comments: wnl  2 TAB 2019 [redacted]w[redacted]d         1 Term 05/05/16 [redacted]w[redacted]d 03:05 / 00:50 3351 g M Vag-Spont Local  LIV    Past Surgical History: Past Surgical History:  Procedure Laterality Date  . DILATION AND CURETTAGE OF UTERUS    . DILATION AND EVACUATION N/A 09/20/2017   Procedure: SUCTION DILATATION AND EVACUATION;  Surgeon: [place] Bing, MD;  Location: [facility];  Service: Gynecology;  Laterality: N/A;  . ESOPHAGOGASTRODUODENOSCOPY N/A 10/17/2012   Procedure: ESOPHAGOGASTRODUODENOSCOPY (EGD);  Surgeon: Jon Gills, MD;  Location: [facility] OR;  Service: Gastroenterology;  Laterality: N/A;  . THERAPEUTIC ABORTION  09/16/2017   Clinic  . WISDOM TOOTH EXTRACTION       Family History: Family History  Problem Relation Age of Onset  . Cholelithiasis Mother   . Hyperlipidemia Mother   . Hypertension Mother   . Mental illness Mother   . Cholelithiasis Maternal Uncle   . Hyperlipidemia Maternal Uncle   . Hypertension Maternal Uncle   . Cholelithiasis Maternal Grandmother   . Arthritis Maternal Grandmother   . COPD Maternal Grandmother   . Depression Maternal Grandmother   . Diabetes Maternal Grandmother   . Heart disease Maternal Grandmother   . Hyperlipidemia Maternal Grandmother   . Hypertension Maternal Grandmother   . Stroke Maternal Grandmother   . Learning disabilities Paternal Grandmother   . Mental illness Paternal Grandmother   . Cancer Paternal Grandmother   . Hyperlipidemia Paternal Grandfather   . Diabetes Paternal Grandfather   . Depression Father   . Hypertension Father   . Ulcers Neg Hx   . Alcohol abuse Neg Hx   . Asthma Neg Hx   . Birth defects Neg Hx   . Drug abuse Neg Hx   . Early death Neg Hx   . Hearing loss Neg Hx   . Kidney disease Neg Hx   . Mental retardation Neg Hx   .  Miscarriages / Stillbirths Neg Hx   . Vision loss Neg Hx     Social History: Social History   Tobacco Use  . Smoking status: Never Smoker  . Smokeless tobacco: Never Used  Substance Use Topics  . Alcohol use: No  . Drug use: No    Allergies:  Allergies  Allergen Reactions  . Chlorhexidine Itching and Rash    Severe itching, redness.   . Tramadol Nausea And Vomiting    Meds:  Medications Prior to Admission  Medication Sig Dispense Refill Last Dose  . aspirin EC 81 MG tablet Take 1 tablet (81 mg total) by mouth daily. Take after 12 weeks for prevention of preeclampsia later in pregnancy 300 tablet 0 06/24/2019 at Unknown time  . Blood Pressure KIT Monitor blood pressure at home regularly extra large O09.90 1 kit 0 06/24/2019 at Unknown time  . docusate sodium (COLACE) 100 MG capsule Take 1 capsule (100 mg total) by mouth 2 (two)  times daily as needed for mild constipation or moderate constipation. 30 capsule 2 06/24/2019 at Unknown time  . Elastic Bandages & Supports (COMFORT FIT MATERNITY SUPP LG) MISC 1 application by Does not apply route daily. 1 each 0 06/25/2019 at Unknown time  . famotidine (PEPCID) 20 MG tablet Take 1 tablet (20 mg total) by mouth 2 (two) times daily. 60 tablet 0 06/25/2019 at Unknown time  . Prenatal Vit-Fe Phos-FA-Omega (VITAFOL GUMMIES) 3.33-0.333-34.8 MG CHEW Chew 1 tablet by mouth daily. 90 tablet 5 06/25/2019 at Unknown time  . acetaminophen (TYLENOL) 500 MG tablet Take 2 tablets (1,000 mg total) by mouth every 6 (six) hours as needed. (Patient not taking: Reported on 04/30/2019) 30 tablet 0   . ferrous sulfate 325 (65 FE) MG tablet TAKE 1 TABLET BY MOUTH TWICE A DAY (Patient not taking: Reported on 03/19/2019) 60 tablet 1   . ibuprofen (ADVIL,MOTRIN) 600 MG tablet Take 1 tablet (600 mg total) by mouth every 6 (six) hours. (Patient not taking: Reported on 09/17/2018) 30 tablet 0   . metroNIDAZOLE (FLAGYL) 500 MG tablet Take 1 tablet (500 mg total) by mouth 2 (two) times daily. (Patient not taking: Reported on 06/18/2019) 14 tablet 0     ROS:  Review of Systems  Constitutional: Negative for chills, fatigue and fever.  Eyes: Negative for visual disturbance.  Respiratory: Negative for shortness of breath.   Cardiovascular: Negative for chest pain.  Gastrointestinal: Negative for abdominal pain, nausea and vomiting.  Genitourinary: Positive for frequency. Negative for difficulty urinating, dysuria, flank pain, pelvic pain, vaginal bleeding, vaginal discharge and vaginal pain.  Neurological: Negative for dizziness and headaches.  Psychiatric/Behavioral: Negative.      I have reviewed patient's Past Medical Hx, Surgical Hx, Family Hx, Social Hx, medications and allergies.   Physical Exam   Patient Vitals for the past 24 hrs:  BP Temp Pulse Resp SpO2  06/25/19 1636 120/67 98.5 F (36.9 C) 94  20 100 %   Constitutional: Well-developed, well-nourished female in no acute distress.  Cardiovascular: normal rate Respiratory: normal effort GI: Abd soft, non-tender, gravid appropriate for gestational age.  MS: Extremities nontender, no edema, normal ROM Neurologic: Alert and oriented x 4.  GU: Neg CVAT.  PELVIC EXAM: Cervix pink, visually closed, without lesion, scant white creamy discharge, vaginal walls and external genitalia normal Bimanual exam: Cervix 0/long/high, firm, anterior, neg CMT, uterus nontender, nonenlarged, adnexa without tenderness, enlargement, or mass     Dilation: 2 Effacement (%): Thick Cervical Position: Posterior Exam by::  Arbie Cookey, CNM   FHT:  Baseline 125 , moderate variability, accelerations present, no decelerations Contractions: irregular, mild to palpation   Labs: No results found for this or any previous visit (from the past 24 hour(s)). A/Positive/-- (08/12 1459)  Imaging:  [place] Mfm Fetal Bpp Wo Non Stress  Result Date: 06/26/2019 ----------------------------------------------------------------------  OBSTETRICS REPORT                       (Signed Final 06/26/2019 08:52 am) ---------------------------------------------------------------------- Patient Info  ID #:       [id number]                          D.O.B.:  [birth year] (22 yrs)  Name:       Traci Castro             Visit Date: 06/25/2019 04:59 pm ---------------------------------------------------------------------- Performed By  Performed By:     Tomma Lightning             Ref. Address:     Faculty                    RDMS,RVT  Attending:        Ma Rings MD         [facility]  Referred By:      Sharyon Cable CNM ---------------------------------------------------------------------- Orders   #  Description                          Code         Ordered By   1  [place]  MFM FETAL BPP WO NON              76819.01     Odell Choung LEFTWICH-      STRESS                                            KIRBY  ----------------------------------------------------------------------   #  Order #                    Accession #                 Episode #   1  [id number]                  [id number]                  [id number]  ---------------------------------------------------------------------- Indications   Decreased fetal movements, third trimester,    O36.8130   unspecified   [redacted] weeks gestation of pregnancy                Z3A.36   Obesity complicating pregnancy, third          O99.213  trimester (BMI 48)   Poor obstetrical history (Hypotension prior    O09.299   pregnancy)   Poor obstetric history: Previous preterm       O09.219   delivery, antepartum (Fetal Bradycardia 36   weeks)   Short interval between pregancies, 3rd         O09.893   trimester   Late to prenatal care, third trimester         O09.33  ---------------------------------------------------------------------- Vital Signs                                                 Height:        5'3" ---------------------------------------------------------------------- Fetal Evaluation  Num Of Fetuses:         1  Fetal Heart Rate(bpm):  132  Cardiac Activity:       Observed  Presentation:           Cephalic  Placenta:               Posterior  Amniotic Fluid  AFI FV:      Within normal limits  AFI Sum(cm)     %Tile       Largest Pocket(cm)  17.95           67          7.26  RUQ(cm)       RLQ(cm)       LUQ(cm)        LLQ(cm)  7.26          1.43          4.02           5.24 ---------------------------------------------------------------------- Biophysical Evaluation  Amniotic F.V:   Within normal limits       F. Tone:        Observed  F. Movement:    Observed                   Score:          8/8  F. Breathing:   Observed ---------------------------------------------------------------------- OB History  Gravidity:    4         Term:   1        Prem:    1        SAB:   0  TOP:          1       Ectopic:  0        Living: 2 ---------------------------------------------------------------------- Gestational Age  LMP:           36w 1d        Date:  10/15/18                 EDD:   07/22/19  Best:          36w 1d     Det. By:  LMP  (10/15/18)          EDD:   07/22/19 ---------------------------------------------------------------------- Cervix Uterus Adnexa  Cervix  Not visualized (advanced GA >24wks) ---------------------------------------------------------------------- Comments  A biophysical profile performed today was 8 out of 8.  There was normal amniotic fluid noted on today's ultrasound  exam. ----------------------------------------------------------------------                   Ma Rings, MD Electronically Signed Final Report  06/26/2019 08:52 am ----------------------------------------------------------------------   MAU Course/MDM: Orders Placed This Encounter  Procedures  . [place] MFM Fetal BPP Wo Non Stress    No orders of the defined types were placed in this encounter.    NST reviewed and reactive in MAU, BPP done with results 8/8.   Reassurance provided to pt No cramping/contractions, cervix 2/thick/ballotable, no evidence of labor today D/C home with kick count review, PTL precautions  Pt discharge with strict retrun precautions.    Assessment:  1. Decreased fetal movements in third trimester, single or unspecified fetus   2. NST (non-stress test) reactive    Plan: Discharge home Labor precautions and fetal kick counts   Allergies as of 06/25/2019      Reactions   Chlorhexidine Itching, Rash   Severe itching, redness.    Tramadol Nausea And Vomiting      Medication List    STOP taking these medications   aspirin EC 81 MG tablet   metroNIDAZOLE 500 MG tablet Commonly known as: FLAGYL     TAKE these medications   acetaminophen 500 MG tablet Commonly known as: TYLENOL Take 2 tablets (1,000 mg total) by mouth  every 6 (six) hours as needed.   Comfort Fit Maternity Supp Lg Misc 1 application by Does not apply route daily.   docusate sodium 100 MG capsule Commonly known as: COLACE Take 1 capsule (100 mg total) by mouth 2 (two) times daily as needed for mild constipation or moderate constipation.   ferrous sulfate 325 (65 FE) MG tablet TAKE 1 TABLET BY MOUTH TWICE A DAY   ibuprofen 600 MG tablet Commonly known as: ADVIL Take 1 tablet (600 mg total) by mouth every 6 (six) hours.   Vitafol Gummies 3.33-0.333-34.8 MG Chew Chew 1 tablet by mouth daily.     ASK your doctor about these medications   Blood Pressure Kit Monitor blood pressure at home regularly extra large O09.90   famotidine 20 MG tablet Commonly known as: Pepcid Take 1 tablet (20 mg total) by mouth 2 (two) times daily.       Sharen Counter Certified Nurse-Midwife 06/25/2019 5:01 PM

## 2019-06-26 LAB — CERVICOVAGINAL ANCILLARY ONLY
Chlamydia: NEGATIVE
Comment: NEGATIVE
Comment: NORMAL
Neisseria Gonorrhea: NEGATIVE

## 2019-06-27 LAB — STREP GP B NAA: Strep Gp B NAA: POSITIVE — AB

## 2019-06-28 ENCOUNTER — Inpatient Hospital Stay (HOSPITAL_COMMUNITY)
Admission: AD | Admit: 2019-06-28 | Discharge: 2019-06-29 | Disposition: A | Discharge status: Home / Self Care | Payer: 59 | Attending: Obstetrics & Gynecology | Admitting: Obstetrics & Gynecology

## 2019-06-28 ENCOUNTER — Encounter (HOSPITAL_COMMUNITY): Payer: Self-pay

## 2019-06-28 ENCOUNTER — Other Ambulatory Visit: Payer: Self-pay

## 2019-06-28 DIAGNOSIS — O26893 Other specified pregnancy related conditions, third trimester: Secondary | ICD-10-CM

## 2019-06-28 DIAGNOSIS — Z888 Allergy status to other drugs, medicaments and biological substances status: Secondary | ICD-10-CM | POA: Insufficient documentation

## 2019-06-28 DIAGNOSIS — Z3A36 36 weeks gestation of pregnancy: Secondary | ICD-10-CM | POA: Insufficient documentation

## 2019-06-28 DIAGNOSIS — Z885 Allergy status to narcotic agent status: Secondary | ICD-10-CM | POA: Insufficient documentation

## 2019-06-28 DIAGNOSIS — O4703 False labor before 37 completed weeks of gestation, third trimester: Secondary | ICD-10-CM | POA: Insufficient documentation

## 2019-06-28 DIAGNOSIS — N898 Other specified noninflammatory disorders of vagina: Secondary | ICD-10-CM | POA: Diagnosis not present

## 2019-06-28 DIAGNOSIS — Z79899 Other long term (current) drug therapy: Secondary | ICD-10-CM | POA: Insufficient documentation

## 2019-06-28 DIAGNOSIS — O479 False labor, unspecified: Secondary | ICD-10-CM

## 2019-06-28 LAB — POCT FERN TEST: POCT Fern Test: NEGATIVE

## 2019-06-28 NOTE — MAU Provider Note (Addendum)
Chief Complaint:  Contractions and Rupture of Membranes   First Provider Initiated Contact with Patient 06/28/19 2138      HPI: Traci Castro is a 23 y.o. [id number] at 107w4d who presents to maternity admissions reporting leaking of fluids since 1900. Thick, mucous-like discharge. Feeling ctx ~ every 10 minutes and rating at 7/10 and breathing through them through her report. Preterm delivery at 36 wk secondary to IOL for BP's. She reports good fetal movement, vaginal bleeding, vaginal itching/burning, urinary symptoms, h/a, dizziness, n/v, or fever/chills.    Past Medical History: Past Medical History:  Diagnosis Date  . Anemia   . Anxiety    no meds  . Bipolar disorder (HR[place]ia   . Depression    no meds  . Gastritis    2 bouts  . H/O seasonal allergies   . Migraine    migraines  . Psoriasis   . SVD (spontaneous vaginal delivery)    x 1  . Vision abnormalities    wears glasses    Past obstetric history: OB History  Gravida Para Term Preterm AB Living  '4 2 1 1 1 2  ' SAB TAB Ectopic Multiple Live Births    1   0 2    # Outcome Date GA Lbr Len/2nd Weight Sex Delivery Anes PTL Lv  4 Current           3 Preterm 07/23/18 354w3d1:45 / 00:02 2950 g M Vag-Spont None  LIV     Birth Comments: wnl  2 TAB 2019 101w3d      1 Term 05/05/16 37w91w2d05 / 00:50 3351 g M Vag-Spont Local  LIV    Past Surgical History: Past Surgical History:  Procedure Laterality Date  . DILATION AND CURETTAGE OF UTERUS    . DILATION AND EVACUATION N/A 09/20/2017   Procedure: SUCTION DILATATION AND EVACUATION;  Surgeon: PickAletha Halim;  [facility];  Service: Gynecology;  Laterality: N/A;  . ESOPHAGOGASTRODUODENOSCOPY N/A 10/17/2012   Procedure: ESOPHAGOGASTRODUODENOSCOPY (EGD);  Surgeon: JoseOletha Blend;  [facility];  Laterality: N/A;  . THERAPEUTIC ABORTION  09/16/2017   Clinic  . WISDOM TOOTH EXTRACTION      Family History: Family  History  Problem Relation Age of Onset  . Cholelithiasis Mother   . Hyperlipidemia Mother   . Hypertension Mother   . Mental illness Mother   . Cholelithiasis Maternal Uncle   . Hyperlipidemia Maternal Uncle   . Hypertension Maternal Uncle   . Cholelithiasis Maternal Grandmother   . Arthritis Maternal Grandmother   . COPD Maternal Grandmother   . Depression Maternal Grandmother   . Diabetes Maternal Grandmother   . Heart disease Maternal Grandmother   . Hyperlipidemia Maternal Grandmother   . Hypertension Maternal Grandmother   . Stroke Maternal Grandmother   . Learning disabilities Paternal Grandmother   . Mental illness Paternal Grandmother   . Cancer Paternal Grandmother   . Hyperlipidemia Paternal Grandfather   . Diabetes Paternal Grandfather   . Depression Father   . Hypertension Father   . Ulcers Neg Hx   . Alcohol abuse Neg Hx   . Asthma Neg Hx   . Birth defects Neg Hx   . Drug abuse Neg Hx   . Early death Neg Hx   . Hearing loss Neg Hx   . Kidney disease Neg Hx   . Mental retardation Neg Hx   . Miscarriages / Stillbirths Neg Hx   .  Vision loss Neg Hx     Social History: Social History   Tobacco Use  . Smoking status: Never Smoker  . Smokeless tobacco: Never Used  Substance Use Topics  . Alcohol use: No  . Drug use: No    Allergies:  Allergies  Allergen Reactions  . Chlorhexidine Itching and Rash    Severe itching, redness.   . Tramadol Nausea And Vomiting    Meds:  Medications Prior to Admission  Medication Sig Dispense Refill Last Dose  . acetaminophen (TYLENOL) 500 MG tablet Take 2 tablets (1,000 mg total) by mouth every 6 (six) hours as needed. (Patient not taking: Reported on 04/30/2019) 30 tablet 0   . Blood Pressure KIT Monitor blood pressure at home regularly extra large O09.90 1 kit 0   . docusate sodium (COLACE) 100 MG capsule Take 1 capsule (100 mg total) by mouth 2 (two) times daily as needed for mild constipation or moderate  constipation. 30 capsule 2   . Elastic Bandages & Supports (COMFORT FIT MATERNITY SUPP LG) MISC 1 application by Does not apply route daily. 1 each 0   . famotidine (PEPCID) 20 MG tablet Take 1 tablet (20 mg total) by mouth 2 (two) times daily. 60 tablet 0   . ferrous sulfate 325 (65 FE) MG tablet TAKE 1 TABLET BY MOUTH TWICE A DAY (Patient not taking: Reported on 03/19/2019) 60 tablet 1   . ibuprofen (ADVIL,MOTRIN) 600 MG tablet Take 1 tablet (600 mg total) by mouth every 6 (six) hours. (Patient not taking: Reported on 09/17/2018) 30 tablet 0   . Prenatal Vit-Fe Phos-FA-Omega (VITAFOL GUMMIES) 3.33-0.333-34.8 MG CHEW Chew 1 tablet by mouth daily. 90 tablet 5     ROS:  Review of Systems All other systems negative unless noted above in HPI.   I have reviewed patient's Past Medical Hx, Surgical Hx, Family Hx, Social Hx, medications and allergies.   Physical Exam   Patient Vitals for the past 24 hrs:  BP Temp Temp src Pulse Resp SpO2  06/28/19 2122 (!) 113/58 - - 88 - -  06/28/19 2106 125/72 98.8 F (37.1 C) Oral 92 14 100 %   Constitutional: Well-developed, well-nourished female in no acute distress.  Cardiovascular: normal rate Respiratory: normal effort GI: Abd soft, non-tender, gravid appropriate for gestational age.  MS: Extremities nontender, no edema, normal ROM Neurologic: Alert and oriented x 4.  GU: Neg CVAT. PELVIC EXAM: Cervix pink and minimally dilated, without lesion, thick white/clear discharge, vaginal walls and external genitalia normal  Dilation: 3 Effacement (%): 60, 70 Cervical Position: Posterior Station: Ballotable Presentation: Vertex Exam by:: Gilmer Mor RN   FHT:  Baseline 120, moderate variability, accelerations present, no decelerations Contractions: infrequent; q~69m  Labs: Results for orders placed or performed during the hospital encounter of 06/28/19 (from the past 24 hour(s))  POCT fern test     Status: None   Collection Time: 06/28/19  10:27 PM  Result Value Ref Range   POCT Fern Test Negative = intact amniotic membranes    A/Positive/-- (08/12 1459)  Imaging:  [place] Fetal Bpp Wo Non Stress  Result Date: 06/26/2019 ----------------------------------------------------------------------  OBSTETRICS REPORT                       (Signed Final 06/26/2019 08:52 am) ---------------------------------------------------------------------- Patient Info  ID #:       [id number]  D.O.B.:  24-Feb-1996 (22 yrs)  Name:       Traci Castro             Visit Date: 06/25/2019 04:59 pm ---------------------------------------------------------------------- Performed By  Performed By:     Corky Crafts             Ref. Address:     Faculty                    RDMS,RVT  Attending:        Johnell Comings MD         [facility]  Referred By:      Lajean Manes CNM ---------------------------------------------------------------------- Orders   #  Description                          Code         Ordered By   1  [place] MFM FETAL BPP WO NON              76819.01     LISA [employer]  ----------------------------------------------------------------------   #  Order #                    Accession #                 Episode #   1  [id number]                  [id number]                  [id number]  ---------------------------------------------------------------------- Indications   Decreased fetal movements, third trimester,    O36.8130   unspecified   [redacted] weeks gestation of pregnancy                Y7X.41   Obesity complicating pregnancy, third          O99.213   trimester (BMI 48)   Poor obstetrical history (Hypotension prior    O09.299   pregnancy)   Poor obstetric history: Previous preterm       O09.219   delivery, antepartum (Fetal Bradycardia 36   weeks)   Short interval between  pregancies, 3rd         O09.893   trimester   Late to prenatal care, third trimester         O09.33  ---------------------------------------------------------------------- Vital Signs  Height:        5'3" ---------------------------------------------------------------------- Fetal Evaluation  Num Of Fetuses:         1  Fetal Heart Rate(bpm):  132  Cardiac Activity:       Observed  Presentation:           Cephalic  Placenta:               Posterior  Amniotic Fluid  AFI FV:      Within normal limits  AFI Sum(cm)     %Tile       Largest Pocket(cm)  17.95           67          7.26  RUQ(cm)       RLQ(cm)       LUQ(cm)        LLQ(cm)  7.26          1.43          4.02           5.24 ---------------------------------------------------------------------- Biophysical Evaluation  Amniotic F.V:   Within normal limits       F. Tone:        Observed  F. Movement:    Observed                   Score:          8/8  F. Breathing:   Observed ---------------------------------------------------------------------- OB History  Gravidity:    4         Term:   1        Prem:   1        SAB:   0  TOP:          1       Ectopic:  0        Living: 2 ---------------------------------------------------------------------- Gestational Age  LMP:           36w 1d        Date:  10/15/18                 EDD:   07/22/19  Best:          36w 1d     Det. By:  LMP  (10/15/18)          EDD:   07/22/19 ---------------------------------------------------------------------- Cervix Uterus Adnexa  Cervix  Not visualized (advanced GA >24wks) ---------------------------------------------------------------------- Comments  A biophysical profile performed today was 8 out of 8.  There was normal amniotic fluid noted on today's ultrasound  exam. ----------------------------------------------------------------------                   Johnell Comings, MD Electronically Signed Final Report   06/26/2019 08:52 am  ----------------------------------------------------------------------  [place] Mfm Ob Follow Up  Result Date: 06/02/2019 ----------------------------------------------------------------------  OBSTETRICS REPORT                       (Signed Final 06/02/2019 04:40 pm) ---------------------------------------------------------------------- Patient Info  ID #:       [id number]                          D.O.B.:  [birth year] (22 yrs)  Name:       Traci Castro             Visit Date: 06/02/2019 03:25 pm ---------------------------------------------------------------------- Performed By  Performed By:     Georgie Chard  Ref. Address:     Faculty                    RDMS  Attending:        Johnell Comings MD         [facility]  Referred By:      Lajean Manes CNM ---------------------------------------------------------------------- Orders   #  Description                          Code         Ordered By   1  [place] MFM OB FOLLOW UP                  26378.58     Sander Nephew  ----------------------------------------------------------------------   #  Order #                    Accession #                 Episode #   1  [id number]                  [id number]                  [id number]  ---------------------------------------------------------------------- Indications   Obesity complicating pregnancy, third          O99.213   trimester (BMI 48)   Poor obstetrical history (Hypotension prior    O09.299   pregnancy)   Poor obstetric history: Previous preterm       O09.219   delivery, antepartum (Fetal Bradycardia 36   weeks)   Short interval between pregancies, 3rd         O09.893   trimester   Late to prenatal care, third trimester         O09.33   [redacted] weeks gestation of pregnancy                Z3A.32   ---------------------------------------------------------------------- Vital Signs                                                 Height:        5'3" ---------------------------------------------------------------------- Fetal Evaluation  Num Of Fetuses:         1  Fetal Heart Rate(bpm):  142  Cardiac Activity:       Observed  Presentation:           Cephalic  Placenta:               Posterior  P. Cord Insertion:      Previously Visualized  Amniotic Fluid  AFI FV:      Within normal limits  AFI Sum(cm)     %Tile       Largest Pocket(cm)  18.79           70          6.29  RUQ(cm)       RLQ(cm)       LUQ(cm)        LLQ(cm)  6.29          4.63          3.5            4.37 ---------------------------------------------------------------------- Biometry  BPD:      81.7  mm     G. Age:  32w 6d         42  %    CI:        78.92   %    70 - 86                                                          FL/HC:      21.2   %    19.9 - 21.5  HC:      290.8  mm     G. Age:  32w 0d          5  %    HC/AC:      0.98        0.96 - 1.11  AC:      297.6  mm     G. Age:  33w 5d         76  %    FL/BPD:     75.5   %    71 - 87  FL:       61.7  mm     G. Age:  32w 0d         18  %    FL/AC:      20.7   %    20 - 24  HUM:        59  mm     G. Age:  34w 1d         85  %  Est. FW:    2092  gm    4 lb 10 oz      44  % ---------------------------------------------------------------------- OB History  Gravidity:    4         Term:   1        Prem:   1        SAB:   0  TOP:          1       Ectopic:  0        Living: 2 ---------------------------------------------------------------------- Gestational Age  LMP:           32w 6d        Date:  10/15/18                 EDD:   07/22/19  U/S Today:     32w 5d  EDD:   07/23/19  Best:          Milderd Meager 6d     Det. By:  LMP  (10/15/18)          EDD:   07/22/19 ---------------------------------------------------------------------- Anatomy  Cranium:               Appears  normal         Ventricles:             Previously seen  Cavum:                 Appears normal ---------------------------------------------------------------------- Comments  This patient was seen for a follow up growth scan due to  maternal obesity.  She reports feeling decreased fetal  movement a few days ago.  She is also concerned as she  reports that her baby's heart rate began to drop starting at  this gestational age during her last pregnancy.  She required  delivery at around 36 weeks in her last pregnancy due to fetal  heart rate issues.  She was informed that the fetal growth and amniotic fluid  level appears appropriate for her gestational age.  Fetal movements and fetal breathing movements were noted  throughout today's exam.  The patient did have a reactive  NST in our office today.  Due to the patient's concerns regarding decreased fetal  movement and her past history, we will start following her  with weekly NSTs today and will continue these tests until  delivery.  A final growth scan was scheduled in 4 weeks. ----------------------------------------------------------------------                   Johnell Comings, MD Electronically Signed Final Report   06/02/2019 04:40 pm ----------------------------------------------------------------------   MAU Course/MDM: Orders Placed This Encounter  Procedures  . Contraction - monitoring  . External fetal heart monitoring  . Vaginal exam  . POCT fern test  . Discharge patient Discharge disposition: 01-Home or Self Care; Discharge patient date: 06/29/2019    No orders of the defined types were placed in this encounter.    Assessment: 1. Vaginal discharge during pregnancy in third trimester   2. Uterine contractions during pregnancy     NST reviewed: Reactive with infrequent contractions. Fern slide negative. Minimal cervical change over > 2 hours. Possibly early labor and return precautions given. Contractions became less severe during MAU stay.  Patient felt comfortable discharging home. Upcoming [place] F/u on 11/3/ Declined pain medication.  Pt discharge with strict return precautions.  Plan: Discharge home Labor precautions and fetal kick counts Follow-up [place] for [facility] Follow up.   Specialty: Obstetrics and Gynecology Contact information: [address], [place] [id number] [place] [id number] [phone]         Allergies as of 06/29/2019      Reactions   Chlorhexidine Itching, Rash   Severe itching, redness.    Tramadol Nausea And Vomiting      Medication List    STOP taking these medications   ibuprofen 600 MG tablet Commonly known as: ADVIL     TAKE these medications   acetaminophen 500 MG tablet Commonly known as: TYLENOL Take 2 tablets (1,000 mg total) by mouth every 6 (six) hours as needed.   Blood Pressure Kit Monitor blood pressure at home regularly extra large O09.90   [place] 1 application by Does not apply route daily.   docusate  sodium 100 MG capsule Commonly known as: COLACE Take 1 capsule (100 mg total) by mouth 2 (two) times daily as needed for mild constipation or moderate constipation.   famotidine 20 MG tablet Commonly known as: Pepcid Take 1 tablet (20 mg total) by mouth 2 (two) times daily.   ferrous sulfate 325 (65 FE) MG tablet TAKE 1 TABLET BY MOUTH TWICE A DAY   Vitafol Gummies 3.33-0.333-34.8 MG Chew Chew 1 tablet by mouth daily.       Barrington Ellison, MD [facility] Fellow, [facility] for [facility] 06/29/2019 12:13 AM

## 2019-06-28 NOTE — MAU Note (Signed)
Reports she is feeling pressure and having some leaking-clear fluid.  Reports feeling contractions in her back, no VB.  Endorses + FM.  PTD at 41 w/ her last pregnancy.  No VE yet.  Next appointment scheduled for 11/3.

## 2019-06-29 ENCOUNTER — Telehealth: Payer: Self-pay

## 2019-06-29 DIAGNOSIS — Z885 Allergy status to narcotic agent status: Secondary | ICD-10-CM | POA: Diagnosis not present

## 2019-06-29 DIAGNOSIS — Z79899 Other long term (current) drug therapy: Secondary | ICD-10-CM | POA: Diagnosis not present

## 2019-06-29 DIAGNOSIS — O26893 Other specified pregnancy related conditions, third trimester: Secondary | ICD-10-CM | POA: Diagnosis not present

## 2019-06-29 DIAGNOSIS — N898 Other specified noninflammatory disorders of vagina: Secondary | ICD-10-CM | POA: Diagnosis not present

## 2019-06-29 DIAGNOSIS — Z888 Allergy status to other drugs, medicaments and biological substances status: Secondary | ICD-10-CM | POA: Diagnosis not present

## 2019-06-29 DIAGNOSIS — O4703 False labor before 37 completed weeks of gestation, third trimester: Secondary | ICD-10-CM | POA: Diagnosis not present

## 2019-06-29 DIAGNOSIS — Z3A36 36 weeks gestation of pregnancy: Secondary | ICD-10-CM | POA: Diagnosis not present

## 2019-06-29 NOTE — Telephone Encounter (Signed)
  Patient called to find out what to do because her mucus plug came out this morning.  Patient reassured that it is normal and if she starts having contractions 3-5 minutes apart for 1 hr she should go to the [facility].

## 2019-06-30 ENCOUNTER — Other Ambulatory Visit: Payer: Self-pay

## 2019-06-30 ENCOUNTER — Ambulatory Visit (HOSPITAL_COMMUNITY)
Admission: RE | Admit: 2019-06-30 | Discharge: 2019-06-30 | Disposition: A | Discharge status: Home / Self Care | Payer: 59 | Source: Ambulatory Visit | Attending: Obstetrics | Admitting: Obstetrics

## 2019-06-30 ENCOUNTER — Encounter (HOSPITAL_COMMUNITY): Payer: Self-pay | Admitting: *Deleted

## 2019-06-30 ENCOUNTER — Ambulatory Visit (HOSPITAL_COMMUNITY): Payer: 59 | Admitting: *Deleted

## 2019-06-30 DIAGNOSIS — O09299 Supervision of pregnancy with other poor reproductive or obstetric history, unspecified trimester: Secondary | ICD-10-CM

## 2019-06-30 DIAGNOSIS — O09892 Supervision of other high risk pregnancies, second trimester: Secondary | ICD-10-CM | POA: Diagnosis not present

## 2019-06-30 DIAGNOSIS — O368190 Decreased fetal movements, unspecified trimester, not applicable or unspecified: Secondary | ICD-10-CM | POA: Diagnosis not present

## 2019-06-30 DIAGNOSIS — Z3480 Encounter for supervision of other normal pregnancy, unspecified trimester: Secondary | ICD-10-CM

## 2019-06-30 DIAGNOSIS — Z362 Encounter for other antenatal screening follow-up: Secondary | ICD-10-CM | POA: Insufficient documentation

## 2019-06-30 DIAGNOSIS — O09899 Supervision of other high risk pregnancies, unspecified trimester: Secondary | ICD-10-CM | POA: Diagnosis not present

## 2019-06-30 DIAGNOSIS — Z3A36 36 weeks gestation of pregnancy: Secondary | ICD-10-CM

## 2019-06-30 DIAGNOSIS — O09219 Supervision of pregnancy with history of pre-term labor, unspecified trimester: Secondary | ICD-10-CM | POA: Diagnosis not present

## 2019-06-30 DIAGNOSIS — O99213 Obesity complicating pregnancy, third trimester: Secondary | ICD-10-CM | POA: Diagnosis not present

## 2019-06-30 DIAGNOSIS — O368130 Decreased fetal movements, third trimester, not applicable or unspecified: Secondary | ICD-10-CM | POA: Diagnosis not present

## 2019-06-30 DIAGNOSIS — O09893 Supervision of other high risk pregnancies, third trimester: Secondary | ICD-10-CM

## 2019-06-30 NOTE — Procedures (Signed)
Traci Castro 1996-06-22 [redacted]w[redacted]d  Fetus A Non-Stress Test Interpretation for 06/30/19  Indication: Perceived decreased fetal movement  Fetal Heart Rate A Mode: External Baseline Rate (A): 125 bpm Variability: Moderate Accelerations: 15 x 15 Decelerations: None Multiple birth?: No  Uterine Activity Mode: Palpation, Toco Contraction Frequency (min): 1 UC with UI Contraction Duration (sec): 10-60 Contraction Quality: Mild Resting Tone Palpated: Relaxed Resting Time: Adequate  Interpretation (Fetal Testing) Nonstress Test Interpretation: Reactive Comments: EFM tracing reviewed by Dr. Gertie Exon

## 2019-07-07 ENCOUNTER — Telehealth: Payer: Self-pay

## 2019-07-07 ENCOUNTER — Inpatient Hospital Stay (HOSPITAL_COMMUNITY)
Admission: AD | Admit: 2019-07-07 | Discharge: 2019-07-07 | Disposition: A | Discharge status: Home / Self Care | Payer: 59 | Attending: Obstetrics and Gynecology | Admitting: Obstetrics and Gynecology

## 2019-07-07 ENCOUNTER — Encounter (HOSPITAL_COMMUNITY): Payer: Self-pay

## 2019-07-07 ENCOUNTER — Other Ambulatory Visit: Payer: Self-pay

## 2019-07-07 DIAGNOSIS — O479 False labor, unspecified: Secondary | ICD-10-CM

## 2019-07-07 DIAGNOSIS — Z3A37 37 weeks gestation of pregnancy: Secondary | ICD-10-CM | POA: Insufficient documentation

## 2019-07-07 DIAGNOSIS — O368130 Decreased fetal movements, third trimester, not applicable or unspecified: Secondary | ICD-10-CM | POA: Diagnosis not present

## 2019-07-07 DIAGNOSIS — Z3A36 36 weeks gestation of pregnancy: Secondary | ICD-10-CM

## 2019-07-07 DIAGNOSIS — Z3689 Encounter for other specified antenatal screening: Secondary | ICD-10-CM

## 2019-07-07 DIAGNOSIS — O471 False labor at or after 37 completed weeks of gestation: Secondary | ICD-10-CM | POA: Insufficient documentation

## 2019-07-07 DIAGNOSIS — Z885 Allergy status to narcotic agent status: Secondary | ICD-10-CM | POA: Diagnosis not present

## 2019-07-07 DIAGNOSIS — O4703 False labor before 37 completed weeks of gestation, third trimester: Secondary | ICD-10-CM | POA: Diagnosis not present

## 2019-07-07 DIAGNOSIS — O09899 Supervision of other high risk pregnancies, unspecified trimester: Secondary | ICD-10-CM

## 2019-07-07 DIAGNOSIS — O09892 Supervision of other high risk pregnancies, second trimester: Secondary | ICD-10-CM

## 2019-07-07 DIAGNOSIS — Z888 Allergy status to other drugs, medicaments and biological substances status: Secondary | ICD-10-CM | POA: Diagnosis not present

## 2019-07-07 DIAGNOSIS — Z3480 Encounter for supervision of other normal pregnancy, unspecified trimester: Secondary | ICD-10-CM

## 2019-07-07 NOTE — MAU Provider Note (Signed)
History     CSN: [id number]  Arrival date and time: 07/07/19 1213   First Provider Initiated Contact with Patient 07/07/19 1305      Chief Complaint  Patient presents with  . Decreased Fetal Movement  . Abdominal Pain  . Nausea   HPI  Traci Castro is a 23 y.o. [id number] at 32w6dwho presents to MAU today with complaint of contractions and decreased fetal movement. The patient states that she was seen for this on 06/28/19. After leaving MAU that day movement improved, but then decreased again today. She has felt movement while in MAU today. She states contractions are frequent, but she is unsure of timing. They have been present for weeks and are not changing. She denies vaginal bleeding, LOF or complications with this pregnancy. She has a history of a previous 36 weeks delivery.   OB History    Gravida  4   Para  2   Term  1   Preterm  1   AB  1   Living  2     SAB      TAB  1   Ectopic      Multiple  0   Live Births  2           Past Medical History:  Diagnosis Date  . Anemia   . Anxiety    no meds  . [place]   . Depression    no meds  . Gastritis    2 bouts  . H/O seasonal allergies   . Migraine    migraines  . Psoriasis   . SVD (spontaneous vaginal delivery)    x 1  . Vision abnormalities    wears glasses    Past Surgical History:  Procedure Laterality Date  . DILATION AND CURETTAGE OF UTERUS    . DILATION AND EVACUATION N/A 09/20/2017   Procedure: SUCTION DILATATION AND EVACUATION;  Surgeon: PAletha Halim MD;  [facility];  Service: Gynecology;  Laterality: N/A;  . ESOPHAGOGASTRODUODENOSCOPY N/A 10/17/2012   Procedure: ESOPHAGOGASTRODUODENOSCOPY (EGD);  Surgeon: JOletha Blend MD;  [facility]  Service: Gastroenterology;  Laterality: N/A;  . THERAPEUTIC ABORTION  09/16/2017   Clinic  . WISDOM TOOTH EXTRACTION      Family History  Problem Relation Age of Onset  . Cholelithiasis Mother    . Hyperlipidemia Mother   . Hypertension Mother   . Mental illness Mother   . Cholelithiasis Maternal Uncle   . Hyperlipidemia Maternal Uncle   . Hypertension Maternal Uncle   . Cholelithiasis Maternal Grandmother   . Arthritis Maternal Grandmother   . COPD Maternal Grandmother   . Depression Maternal Grandmother   . Diabetes Maternal Grandmother   . Heart disease Maternal Grandmother   . Hyperlipidemia Maternal Grandmother   . Hypertension Maternal Grandmother   . Stroke Maternal Grandmother   . Learning disabilities Paternal Grandmother   . Mental illness Paternal Grandmother   . Cancer Paternal Grandmother   . Hyperlipidemia Paternal Grandfather   . Diabetes Paternal Grandfather   . Depression Father   . Hypertension Father   . Ulcers Neg Hx   . Alcohol abuse Neg Hx   . Asthma Neg Hx   . Birth defects Neg Hx   . Drug abuse Neg Hx   . Early death Neg Hx   . Hearing loss Neg Hx   . Kidney disease Neg Hx   . Mental retardation Neg Hx   .  Miscarriages / Stillbirths Neg Hx   . Vision loss Neg Hx     Social History   Tobacco Use  . Smoking status: Never Smoker  . Smokeless tobacco: Never Used  Substance Use Topics  . Alcohol use: No  . Drug use: No    Allergies:  Allergies  Allergen Reactions  . Chlorhexidine Itching and Rash    Severe itching, redness.   . Tramadol Nausea And Vomiting    Medications Prior to Admission  Medication Sig Dispense Refill Last Dose  . Blood Pressure KIT Monitor blood pressure at home regularly extra large O09.90 1 kit 0 07/06/2019 at Unknown time  . docusate sodium (COLACE) 100 MG capsule Take 1 capsule (100 mg total) by mouth 2 (two) times daily as needed for mild constipation or moderate constipation. 30 capsule 2 07/06/2019 at 2000  . Elastic Bandages & Supports (COMFORT FIT MATERNITY SUPP LG) MISC 1 application by Does not apply route daily. 1 each 0 07/06/2019 at Unknown time  . famotidine (PEPCID) 20 MG tablet Take 1 tablet (20  mg total) by mouth 2 (two) times daily. 60 tablet 0 07/07/2019 at 0800  . Prenatal Vit-Fe Phos-FA-Omega (VITAFOL GUMMIES) 3.33-0.333-34.8 MG CHEW Chew 1 tablet by mouth daily. 90 tablet 5 07/06/2019 at 2000  . acetaminophen (TYLENOL) 500 MG tablet Take 2 tablets (1,000 mg total) by mouth every 6 (six) hours as needed. (Patient not taking: Reported on 04/30/2019) 30 tablet 0   . ferrous sulfate 325 (65 FE) MG tablet TAKE 1 TABLET BY MOUTH TWICE A DAY (Patient not taking: Reported on 03/19/2019) 60 tablet 1     Review of Systems  Constitutional: Negative for fever.  Gastrointestinal: Positive for abdominal pain and nausea. Negative for constipation, diarrhea and vomiting.  Genitourinary: Negative for dysuria, frequency, urgency, vaginal bleeding and vaginal discharge.   Physical Exam   Blood pressure 131/75, pulse 97, temperature 98.3 F (36.8 C), temperature source Oral, resp. rate 18, height 5' 3" (1.6 m), weight 134.4 kg, last menstrual period 10/15/2018, SpO2 100 %, currently breastfeeding.  Physical Exam  Nursing note and vitals reviewed. Constitutional: She is oriented to person, place, and time. She appears well-developed and well-nourished. No distress.  HENT:  Head: Normocephalic and atraumatic.  Cardiovascular: Normal rate.  Respiratory: Effort normal.  GI: Soft. She exhibits no distension and no mass. There is no abdominal tenderness. There is no rebound and no guarding.  Neurological: She is alert and oriented to person, place, and time.  Skin: Skin is warm and dry. No erythema.  Psychiatric: She has a normal mood and affect.  Dilation: 3 Effacement (%): 20 Cervical Position: Posterior Station: -3 Presentation: Vertex Exam by:: Kerry Hough, PA-C   Fetal Monitoring: Baseline: 120 bpm Variability: moderate Accelerations: 15 x 15 Decelerations: none Contractions: few, irregular    MAU Course  Procedures None  MDM Cervix unchanged from previous visit in MAU.  Unchanged on recheck prior to discharge as well.  Patient reports +FM while in MAU today. Reactive NST.   Assessment and Plan  A: SIUP at 44w6dReactive NST  False labor   P:  Discharge home Labor precautions discussed Patient advised to follow-up with [facility] as scheduled on 07/09/19 for routine prenatal visit  Patient may return to MAU as needed or if her condition were to change or worsen  JKerry Hough PA-C 07/07/2019, 1:46 PM

## 2019-07-07 NOTE — MAU Note (Signed)
Having stomach pains(<69min), nausea and decreased fetal movement. No bleeding or water leaking. No recent exam.

## 2019-07-07 NOTE — Discharge Instructions (Signed)

## 2019-07-07 NOTE — Telephone Encounter (Signed)
TC from pt c/o decreased FM , nausea and contractions ( 5 mins)  Pt denies VB, No LOF, pt stated she has felt FM only 1-2 times this morning and baby is usually more active. Pt advised for assurance she can report to MAU for evaluation.

## 2019-07-07 NOTE — Telephone Encounter (Signed)
Pt requesting Rx for Nausea I advised she go to MAU She is c/o of decreased FM (only moved 1-2 times) ,and contractions (38mins)  as well No LOF, No vaginal bleeding.  I let her know I would still reach out to you and ask but this most likely will be addressed at MAU.

## 2019-07-09 ENCOUNTER — Encounter (HOSPITAL_COMMUNITY): Payer: Self-pay | Admitting: *Deleted

## 2019-07-09 ENCOUNTER — Ambulatory Visit (INDEPENDENT_AMBULATORY_CARE_PROVIDER_SITE_OTHER): Payer: 59 | Admitting: Certified Nurse Midwife

## 2019-07-09 ENCOUNTER — Telehealth (HOSPITAL_COMMUNITY): Payer: Self-pay | Admitting: *Deleted

## 2019-07-09 ENCOUNTER — Other Ambulatory Visit: Payer: Self-pay

## 2019-07-09 ENCOUNTER — Encounter: Payer: Self-pay | Admitting: Certified Nurse Midwife

## 2019-07-09 VITALS — BP 116/74 | HR 92 | Wt 300.0 lb

## 2019-07-09 DIAGNOSIS — O99210 Obesity complicating pregnancy, unspecified trimester: Secondary | ICD-10-CM

## 2019-07-09 DIAGNOSIS — O99893 Other specified diseases and conditions complicating puerperium: Secondary | ICD-10-CM

## 2019-07-09 DIAGNOSIS — O99891 Other specified diseases and conditions complicating pregnancy: Secondary | ICD-10-CM

## 2019-07-09 DIAGNOSIS — Z3480 Encounter for supervision of other normal pregnancy, unspecified trimester: Secondary | ICD-10-CM

## 2019-07-09 DIAGNOSIS — Z283 Underimmunization status: Secondary | ICD-10-CM

## 2019-07-09 DIAGNOSIS — O09899 Supervision of other high risk pregnancies, unspecified trimester: Secondary | ICD-10-CM

## 2019-07-09 DIAGNOSIS — O99213 Obesity complicating pregnancy, third trimester: Secondary | ICD-10-CM

## 2019-07-09 DIAGNOSIS — Z3A38 38 weeks gestation of pregnancy: Secondary | ICD-10-CM

## 2019-07-09 NOTE — Addendum Note (Signed)
Addended by: Lajean Manes on: 07/09/2019 02:21 PM   Modules accepted: Orders, SmartSet

## 2019-07-09 NOTE — Telephone Encounter (Signed)
Preadmission screen  

## 2019-07-09 NOTE — Progress Notes (Signed)
   PRENATAL VISIT NOTE  Subjective:  Traci Castro is a 23 y.o. [id number] at 17w1dbeing seen today for ongoing prenatal care.  She is currently monitored for the following issues for this low-risk pregnancy and has BMI 45.0-49.9, adult ([place]; [name], antepartum; [name]; Hypotension, unspecified; Obesity during pregnancy, antepartum; [name], antepartum; [name]; [name], antepartum; and [name], currently pregnant on their problem list.  Patient reports occasional contractions.  Contractions: Irregular. Vag. Bleeding: None.  Movement: Present. Denies leaking of fluid.   The following portions of the patient's history were reviewed and updated as appropriate: allergies, current medications, past family history, past medical history, past social history, past surgical history and problem list.   Objective:   Vitals:   07/09/19 1321  BP: 116/74  Pulse: 92  Weight: 300 lb (136.1 kg)    Fetal Status: Fetal Heart Rate (bpm): 137 Fundal Height: 43 cm Movement: Present  Presentation: Vertex  General:  Alert, oriented and cooperative. Patient is in no acute distress.  Skin: Skin is warm and dry. No rash noted.   Cardiovascular: Normal heart rate noted  Respiratory: Normal respiratory effort, no problems with respiration noted  Abdomen: Soft, gravid, appropriate for gestational age.  Pain/Pressure: Present     Pelvic: Cervical exam performed Dilation: 3 Effacement (%): Thick Station: -3  Extremities: Normal range of motion.  Edema: Trace  Mental Status: Normal mood and affect. Normal behavior. Normal judgment and thought content.   Assessment and Plan:  Pregnancy: [id number] 349w1d. [name], antepartum - Patient reports being tired and "over it", wants this  pregnancy to be over and asked to be induced  - Patient reports she has been in the MAU more over this past week than the entire pregnancy  - Educated and discussed risk of induction with the increased risk of C/S- patient verbalizes understanding  - Patient cervix favorable and will be 39 weeks on 07/15/19 - IOL scheduled for 07/15/19- orders placed, discussed with patient new process of induction based on priority and hospital will call you when room is ready, patient verbalizes understanding.  - Discussed warning signs and reasons to present to MAU  - Labor precautions   2. Obesity during pregnancy, antepartum - Est. FW:  3370  gm    7 lb 7 oz   84  %  3. [name], antepartum - MMR PP   Term labor symptoms and general obstetric precautions including but not limited to vaginal bleeding, contractions, leaking of fluid and fetal movement were reviewed in detail with the patient. Please refer to After Visit Summary for other counseling recommendations.   Return in about 5 weeks (around 08/13/2019) for POSTPARTUM.  VeLajean ManesCNM

## 2019-07-13 ENCOUNTER — Other Ambulatory Visit (HOSPITAL_COMMUNITY)
Admission: RE | Admit: 2019-07-13 | Discharge: 2019-07-13 | Disposition: A | Discharge status: Home / Self Care | Payer: 59 | Source: Ambulatory Visit | Attending: Obstetrics and Gynecology | Admitting: Obstetrics and Gynecology

## 2019-07-13 LAB — SARS CORONAVIRUS 2 (TAT 6-24 HRS): SARS Coronavirus 2: NEGATIVE

## 2019-07-15 ENCOUNTER — Inpatient Hospital Stay (HOSPITAL_COMMUNITY): Payer: 59

## 2019-07-15 ENCOUNTER — Other Ambulatory Visit: Payer: Self-pay

## 2019-07-15 ENCOUNTER — Inpatient Hospital Stay (HOSPITAL_COMMUNITY)
Admission: AD | Admit: 2019-07-15 | Discharge: 2019-07-17 | DRG: 807 | Disposition: A | Discharge status: Home / Self Care | Payer: 59 | Attending: Obstetrics & Gynecology | Admitting: Obstetrics & Gynecology

## 2019-07-15 ENCOUNTER — Encounter (HOSPITAL_COMMUNITY): Payer: Self-pay | Admitting: *Deleted

## 2019-07-15 DIAGNOSIS — Z3A39 39 weeks gestation of pregnancy: Secondary | ICD-10-CM | POA: Diagnosis not present

## 2019-07-15 DIAGNOSIS — Z30017 Encounter for initial prescription of implantable subdermal contraceptive: Secondary | ICD-10-CM

## 2019-07-15 DIAGNOSIS — Z3490 Encounter for supervision of normal pregnancy, unspecified, unspecified trimester: Secondary | ICD-10-CM | POA: Diagnosis present

## 2019-07-15 DIAGNOSIS — D560 Alpha thalassemia: Secondary | ICD-10-CM | POA: Diagnosis present

## 2019-07-15 DIAGNOSIS — O99891 Other specified diseases and conditions complicating pregnancy: Secondary | ICD-10-CM

## 2019-07-15 DIAGNOSIS — Z20828 Contact with and (suspected) exposure to other viral communicable diseases: Secondary | ICD-10-CM | POA: Diagnosis present

## 2019-07-15 DIAGNOSIS — Z283 Underimmunization status: Secondary | ICD-10-CM

## 2019-07-15 DIAGNOSIS — O09899 Supervision of other high risk pregnancies, unspecified trimester: Secondary | ICD-10-CM

## 2019-07-15 DIAGNOSIS — Z3480 Encounter for supervision of other normal pregnancy, unspecified trimester: Secondary | ICD-10-CM

## 2019-07-15 DIAGNOSIS — O99824 Streptococcus B carrier state complicating childbirth: Principal | ICD-10-CM | POA: Diagnosis present

## 2019-07-15 DIAGNOSIS — O99214 Obesity complicating childbirth: Secondary | ICD-10-CM | POA: Diagnosis present

## 2019-07-15 DIAGNOSIS — O09892 Supervision of other high risk pregnancies, second trimester: Secondary | ICD-10-CM

## 2019-07-15 DIAGNOSIS — O26893 Other specified pregnancy related conditions, third trimester: Secondary | ICD-10-CM | POA: Diagnosis present

## 2019-07-15 DIAGNOSIS — O99210 Obesity complicating pregnancy, unspecified trimester: Secondary | ICD-10-CM | POA: Diagnosis present

## 2019-07-15 HISTORY — DX: Encounter for supervision of normal pregnancy, unspecified, unspecified trimester: Z34.90

## 2019-07-15 LAB — CBC
HCT: 34.4 % — ABNORMAL LOW (ref 36.0–46.0)
Hemoglobin: 10.5 g/dL — ABNORMAL LOW (ref 12.0–15.0)
MCH: 24.5 pg — ABNORMAL LOW (ref 26.0–34.0)
MCHC: 30.5 g/dL (ref 30.0–36.0)
MCV: 80.4 fL (ref 80.0–100.0)
Platelets: 302 10*3/uL (ref 150–400)
RBC: 4.28 MIL/uL (ref 3.87–5.11)
RDW: 14.4 % (ref 11.5–15.5)
WBC: 9.1 10*3/uL (ref 4.0–10.5)
nRBC: 0 % (ref 0.0–0.2)

## 2019-07-15 LAB — ABO/RH: ABO/RH(D): A POS

## 2019-07-15 LAB — TYPE AND SCREEN
ABO/RH(D): A POS
Antibody Screen: NEGATIVE

## 2019-07-15 LAB — RPR: RPR Ser Ql: NONREACTIVE

## 2019-07-15 MED ORDER — OXYCODONE HCL 5 MG PO TABS
5.0000 mg | ORAL_TABLET | ORAL | Status: DC | PRN
  Administered 2019-07-16 – 2019-07-17 (×3): 5 mg via ORAL
  Filled 2019-07-15 (×3): qty 1

## 2019-07-15 MED ORDER — OXYTOCIN 40 UNITS IN NORMAL SALINE INFUSION - SIMPLE MED
1.0000 m[IU]/min | INTRAVENOUS | Status: DC
  Administered 2019-07-15: 09:00:00 2 m[IU]/min via INTRAVENOUS
  Filled 2019-07-15: qty 1000

## 2019-07-15 MED ORDER — MEASLES, MUMPS & RUBELLA VAC IJ SOLR: 0.5000 mL | Freq: Once | INTRAMUSCULAR | Status: DC

## 2019-07-15 MED ORDER — LIDOCAINE HCL (PF) 1 % IJ SOLN: 30.0000 mL | INTRAMUSCULAR | Status: DC | PRN

## 2019-07-15 MED ORDER — FENTANYL CITRATE (PF) 100 MCG/2ML IJ SOLN
INTRAMUSCULAR | Status: AC
  Filled 2019-07-15: qty 2

## 2019-07-15 MED ORDER — PRENATAL MULTIVITAMIN CH
1.0000 | ORAL_TABLET | Freq: Every day | ORAL | Status: DC
  Administered 2019-07-16 – 2019-07-17 (×2): 1 via ORAL
  Filled 2019-07-15 (×2): qty 1

## 2019-07-15 MED ORDER — ONDANSETRON HCL 4 MG/2ML IJ SOLN
4.0000 mg | Freq: Four times a day (QID) | INTRAMUSCULAR | Status: DC | PRN
  Administered 2019-07-15: 4 mg via INTRAVENOUS
  Filled 2019-07-15: qty 2

## 2019-07-15 MED ORDER — LACTATED RINGERS IV SOLN: 500.0000 mL | INTRAVENOUS | Status: DC | PRN

## 2019-07-15 MED ORDER — BENZOCAINE-MENTHOL 20-0.5 % EX AERO: 1.0000 "application " | INHALATION_SPRAY | CUTANEOUS | Status: DC | PRN

## 2019-07-15 MED ORDER — DIBUCAINE (PERIANAL) 1 % EX OINT: 1.0000 "application " | TOPICAL_OINTMENT | CUTANEOUS | Status: DC | PRN

## 2019-07-15 MED ORDER — ACETAMINOPHEN 325 MG PO TABS: 650.0000 mg | ORAL_TABLET | ORAL | Status: DC | PRN

## 2019-07-15 MED ORDER — OXYTOCIN BOLUS FROM INFUSION
500.0000 mL | Freq: Once | INTRAVENOUS | Status: AC
  Administered 2019-07-15: 500 mL via INTRAVENOUS

## 2019-07-15 MED ORDER — LACTATED RINGERS IV SOLN
INTRAVENOUS | Status: DC
  Administered 2019-07-15: 09:00:00 via INTRAVENOUS

## 2019-07-15 MED ORDER — SENNOSIDES-DOCUSATE SODIUM 8.6-50 MG PO TABS
2.0000 | ORAL_TABLET | ORAL | Status: DC
  Administered 2019-07-16 – 2019-07-17 (×2): 2 via ORAL
  Filled 2019-07-15 (×2): qty 2

## 2019-07-15 MED ORDER — IBUPROFEN 600 MG PO TABS
600.0000 mg | ORAL_TABLET | Freq: Four times a day (QID) | ORAL | Status: DC
  Administered 2019-07-16 – 2019-07-17 (×7): 600 mg via ORAL
  Filled 2019-07-15 (×7): qty 1

## 2019-07-15 MED ORDER — WITCH HAZEL-GLYCERIN EX PADS: 1.0000 "application " | MEDICATED_PAD | CUTANEOUS | Status: DC | PRN

## 2019-07-15 MED ORDER — ACETAMINOPHEN 325 MG PO TABS
650.0000 mg | ORAL_TABLET | ORAL | Status: DC | PRN
  Administered 2019-07-16 (×5): 650 mg via ORAL
  Filled 2019-07-15 (×5): qty 2

## 2019-07-15 MED ORDER — OXYTOCIN 40 UNITS IN NORMAL SALINE INFUSION - SIMPLE MED: 2.5000 [IU]/h | INTRAVENOUS | Status: DC

## 2019-07-15 MED ORDER — SODIUM CHLORIDE 0.9 % IV SOLN: 2.0000 g | Freq: Once | INTRAVENOUS | Status: DC

## 2019-07-15 MED ORDER — SOD CITRATE-CITRIC ACID 500-334 MG/5ML PO SOLN: 30.0000 mL | ORAL | Status: DC | PRN

## 2019-07-15 MED ORDER — PENICILLIN G POT IN DEXTROSE 60000 UNIT/ML IV SOLN
3.0000 10*6.[IU] | INTRAVENOUS | Status: DC
  Administered 2019-07-15 (×2): 3 10*6.[IU] via INTRAVENOUS
  Filled 2019-07-15 (×3): qty 50

## 2019-07-15 MED ORDER — FENTANYL CITRATE (PF) 100 MCG/2ML IJ SOLN
100.0000 ug | Freq: Once | INTRAMUSCULAR | Status: AC
  Administered 2019-07-15: 100 ug via INTRAVENOUS

## 2019-07-15 MED ORDER — ONDANSETRON HCL 4 MG PO TABS: 4.0000 mg | ORAL_TABLET | ORAL | Status: DC | PRN

## 2019-07-15 MED ORDER — COCONUT OIL OIL
1.0000 "application " | TOPICAL_OIL | Status: DC | PRN
  Administered 2019-07-16: 1 via TOPICAL

## 2019-07-15 MED ORDER — TERBUTALINE SULFATE 1 MG/ML IJ SOLN: 0.2500 mg | Freq: Once | INTRAMUSCULAR | Status: DC | PRN

## 2019-07-15 MED ORDER — ONDANSETRON HCL 4 MG/2ML IJ SOLN: 4.0000 mg | INTRAMUSCULAR | Status: DC | PRN

## 2019-07-15 MED ORDER — ZOLPIDEM TARTRATE 5 MG PO TABS: 5.0000 mg | ORAL_TABLET | Freq: Every evening | ORAL | Status: DC | PRN

## 2019-07-15 MED ORDER — SIMETHICONE 80 MG PO CHEW: 80.0000 mg | CHEWABLE_TABLET | ORAL | Status: DC | PRN

## 2019-07-15 MED ORDER — SODIUM CHLORIDE 0.9 % IV SOLN
5.0000 10*6.[IU] | Freq: Once | INTRAVENOUS | Status: AC
  Administered 2019-07-15: 5 10*6.[IU] via INTRAVENOUS
  Filled 2019-07-15: qty 5

## 2019-07-15 MED ORDER — DIPHENHYDRAMINE HCL 25 MG PO CAPS: 25.0000 mg | ORAL_CAPSULE | Freq: Four times a day (QID) | ORAL | Status: DC | PRN

## 2019-07-15 MED ORDER — FENTANYL CITRATE (PF) 100 MCG/2ML IJ SOLN
100.0000 ug | Freq: Once | INTRAMUSCULAR | Status: AC
  Administered 2019-07-15: 21:00:00 100 ug via INTRAVENOUS

## 2019-07-15 NOTE — Progress Notes (Signed)
Traci Castro is a 23 y.o. [id number] at [redacted]w[redacted]d admitted for elective induction of labor  Subjective: Patient reporting intermittent abdominal pressure during contractions. Denies questions or concerns.  Objective: BP 118/71   Pulse 79   Temp 99.7 F (37.6 C) (Oral)   Resp 18   Ht 5\' 3"  (1.6 m)   Wt 136.1 kg   LMP 10/15/2018   BMI 53.14 kg/m  No intake/output data recorded. No intake/output data recorded.  FHT:  FHR: 120 bpm, variability: moderate,  accelerations:  Present,  decelerations:  Absent UC:   irregular, every 2-5 minutes SVE:   Dilation: 3 Effacement (%): 50 Station: Ballotable Exam by:: Epifanio Lesches, RNC  Labs: Lab Results  Component Value Date   WBC 9.1 07/15/2019   HGB 10.5 (L) 07/15/2019   HCT 34.4 (L) 07/15/2019   MCV 80.4 07/15/2019   PLT 302 07/15/2019    Assessment / Plan: --Category I tracing --GBS POS. S/p PCN at [phone] and 1330 --Cervix checked, unchanged from previous at [place] --Pitocin infusing at 14 milliunits, continue to titrate PRN --Anticipate NSVD  Darlina Rumpf, CNM 07/15/2019, 2:06 PM

## 2019-07-15 NOTE — H&P (Signed)
Traci Castro is a 23 y.o. [phone] female presenting for elective induction of labor at [redacted]w[redacted]d At time of admission she denies vaginal bleeding, leaking of fluid, decreased fetal movement, fever, falls, or recent illness.   Prenatal History [facility] Dating by LMP Silent carrier for alpha thalassemia No referrals EFW 84% (3370g) at 36.6 Rubella Non-immune  OB History    Gravida  4   Para  2   Term  1   Preterm  1   AB  1   Living  2     SAB      TAB  1   Ectopic      Multiple  0   Live Births  2          Past Medical History:  Diagnosis Date  . Anemia   . Anxiety    no meds  . Bipolar disorder ([place]   . Blood transfusion without reported diagnosis    for pp anemia not PPH  . Depression    no meds  . Gastritis    2 bouts  . H/O seasonal allergies   . Migraine    migraines  . Psoriasis   . SVD (spontaneous vaginal delivery)    x 1  . Vision abnormalities    wears glasses   Past Surgical History:  Procedure Laterality Date  . DILATION AND CURETTAGE OF UTERUS    . DILATION AND EVACUATION N/A 09/20/2017   Procedure: SUCTION DILATATION AND EVACUATION;  Surgeon: PAletha Halim MD;  [facility];  Service: Gynecology;  Laterality: N/A;  . ESOPHAGOGASTRODUODENOSCOPY N/A 10/17/2012   Procedure: ESOPHAGOGASTRODUODENOSCOPY (EGD);  Surgeon: JOletha Blend MD;  [facility]  Service: Gastroenterology;  Laterality: N/A;  . THERAPEUTIC ABORTION  09/16/2017   Clinic  . WISDOM TOOTH EXTRACTION     Family History: family history includes Arthritis in her maternal grandmother; COPD in her maternal grandmother; Cancer in her paternal grandmother; Cholelithiasis in her maternal grandmother, maternal uncle, and mother; Depression in her father; Diabetes in her maternal grandmother and paternal grandfather; Heart disease in her maternal grandmother; Hyperlipidemia in her maternal grandmother, maternal uncle, mother, and paternal grandfather;  Hypertension in her father, maternal grandmother, maternal uncle, and mother; Learning disabilities in her paternal grandmother; Mental illness in her mother and paternal grandmother; Stroke in her maternal grandmother; Thyroid disease in her mother. Social History:  reports that she has never smoked. She has never used smokeless tobacco. She reports that she does not drink alcohol or use drugs.     Maternal Diabetes: No Genetic Screening: Abnormal:  Results: Other: Silent carrier alpha thalassemia Maternal Ultrasounds/Referrals: Normal Fetal Ultrasounds or other Referrals:  None Maternal Substance Abuse:  No Significant Maternal Medications:  None Significant Maternal Lab Results:  Group B Strep positive Other Comments:  None  Review of Systems  Constitutional: Negative for chills and fever.  Respiratory: Negative for shortness of breath.   Gastrointestinal: Negative for abdominal pain.  Musculoskeletal: Negative for back pain.  Neurological: Negative for headaches.  All other systems reviewed and are negative.  Dilation: 3 Effacement (%): 50 Station: Ballotable Exam by:: JEpifanio Lesches RNC Blood pressure (!) 102/50, pulse 72, temperature 99.7 F (37.6 C), temperature source Oral, resp. rate 18, height _0  (1.6 m), weight 136.1 kg, last menstrual period 10/15/2018, currently breastfeeding.   Physical Exam  Nursing note and vitals reviewed. Constitutional: She is oriented to person, place, and time. She appears well-developed and well-nourished.  Cardiovascular: Normal rate  and normal pulses.  Respiratory: Effort normal and breath sounds normal. She has no decreased breath sounds.  GI: Soft. She exhibits no distension. There is no abdominal tenderness. There is no rebound, no guarding and no CVA tenderness.  Gravid  Genitourinary:    Vagina and uterus normal.   Neurological: She is alert and oriented to person, place, and time.  Skin: Skin is warm and dry.  Psychiatric:  She has a normal mood and affect. Her behavior is normal. Judgment and thought content normal.    Prenatal labs: ABO, Rh: --/--/A POS, A POS Performed at [facility], [address], [place], [place] [address]  [phone]) Antibody: NEG (11/18 0840) Rubella: <0.90 (08/12 1459) RPR: Non Reactive (09/03 0947)  HBsAg: Negative (08/12 1459)  HIV: Non Reactive (09/03 0947)  GBS: --Tessie Fass (10/29 0309)   Fetal Surveillance --Reactive Tracing --Baseline 125, mod variability, positive 15 x 15 accels, no decels --Toco: Occasional contractions, palpate mild  Assessment/Plan: Labor  --23 y.o. [id number] at 52w0dadmitted for elective IOL --GBS Positive, PCN ordered --Category I tracing --3cm on admission, initiate Pitocin 2 x 2, titrate PRN --Desires IV Fentanyl in active labor, not planning epidural --boy/breast/Nexplanon  Postpartum Planning --Nexplanon for contraception --outpatient circ --MMR pp --Hx unstable housing per note 04/14/19, advise inpatient SW consult  SDarlina Rumpf CNM 07/15/2019, 11:39 AM

## 2019-07-15 NOTE — Progress Notes (Signed)
LABOR PROGRESS NOTE  Traci Castro is a 23 y.o. [phone] at [redacted]w[redacted]d  admitted for elective IOL   Subjective: Patient doing okay, received fentanyl at 2017   Objective: BP 127/62   Pulse 79   Temp 98.1 F (36.7 C) (Oral)   Resp 16   Ht 5\' 3"  (1.6 m)   Wt 136.1 kg   LMP 10/15/2018   SpO2 100%   BMI 53.14 kg/m  or  Vitals:   07/15/19 1856 07/15/19 1920 07/15/19 2000 07/15/19 2022  BP:  (!) 91/47  127/62  Pulse:  (!) 106  79  Resp:  18 18 16   Temp: 98 F (36.7 C) 98.1 F (36.7 C)    TempSrc: Oral Oral    SpO2:      Weight:      Height:        Cervix unchanged prior to medication administration.  Pitocin currently at 29milli-unit/min  Dilation: 6 Effacement (%): 70 Station: -2 Presentation: Vertex Exam by:: Epifanio Lesches, RNC FHT: baseline rate 125, moderate varibility, +accel, variable decel Toco: 2-4/ moderate by palpation   Labs: Lab Results  Component Value Date   WBC 9.1 07/15/2019   HGB 10.5 (L) 07/15/2019   HCT 34.4 (L) 07/15/2019   MCV 80.4 07/15/2019   PLT 302 07/15/2019    Patient Active Problem List   Diagnosis Date Noted  . Encounter for induction of labor 07/15/2019  . [name], antepartum 04/08/2019  . [name] 04/08/2019  . [name], antepartum 04/08/2019  . [name], currently pregnant 04/08/2019  . Hypotension, unspecified 07/16/2018  . Obesity during pregnancy, antepartum 07/16/2018  . [name], antepartum 01/31/2018  . [name] 01/31/2018  . BMI 45.0-49.9, adult ([place]) 03/15/2016    Assessment / Plan: 23 y.o. I4P3295 at [redacted]w[redacted]d here for elective IOL   Labor: Position changed to right side with peanut, anticipate delivery soon  Fetal Wellbeing:  Cat II  Pain Control:  IV Fentanyl  Anticipated MOD:  SVD  Lajean Manes,  CNM 07/15/2019, 8:56 PM

## 2019-07-15 NOTE — Discharge Summary (Addendum)
  Postpartum Discharge Summary  Date of Service updated 07/17/2019     Patient Name: Traci Castro DOB: 12/03/1995 MRN: [id number]  Date of admission: 07/15/2019 Delivering Provider: ROGERS, VERONICA Castro   Date of discharge: 07/17/2019  Admitting diagnosis: PREG Intrauterine pregnancy: [redacted]w[redacted]d     Secondary diagnosis:  Active Problems:   [name], antepartum   Obesity during pregnancy, antepartum   [name], antepartum   Encounter for induction of labor   SVD (spontaneous vaginal delivery)  Additional problems: none     Discharge diagnosis: Term Pregnancy Delivered                                                                                                Post partum procedures:Nexplanon insertion  Augmentation: AROM and Pitocin  Complications: None  Hospital course:  Induction of Labor With Vaginal Delivery   22 y.o. yo G4P1112 at [redacted]w[redacted]d was admitted to the hospital 07/15/2019 for induction of labor.  Indication for induction: elective IOL.  Patient had an uncomplicated labor course as follows: Membrane Rupture Time/Date: 5:40 PM ,07/15/2019   Intrapartum Procedures: Episiotomy: None [1]                                         Lacerations:  None [1]  Patient had delivery of a Viable infant.  Information for the patient's newborn:  Traci Castro [[id number]]  Delivery Method: Vaginal, Spontaneous(Filed from Delivery Summary)    07/15/2019  Details of delivery can be found in separate delivery note.  Patient had a routine postpartum course. Patient is discharged home 07/17/19. Delivery time: 9:16 PM    Magnesium Sulfate received: No BMZ received: No Rhophylac:N/A MMR:Yes Transfusion:No  Physical exam  Vitals:   07/16/19 0759 07/16/19 1116 07/16/19 1443 07/17/19 0600  BP: (!) 112/55 (!) 92/52 (!) 113/50 114/69  Pulse: 64 74 75 74  Resp: 18 18 18 18  Temp: 98.2 F (36.8 Castro)  (!) 97.4 F (36.3 Castro) 98.3 F (36.8 Castro) 98.2 F (36.8 Castro)  TempSrc: Oral Oral Oral Oral  SpO2: 100%  100% 100%  Weight:      Height:       General: alert, cooperative and no distress Lochia: appropriate Uterine Fundus: firm Incision: N/A DVT Evaluation: No evidence of DVT seen on physical exam. Negative Homan's sign. No cords or calf tenderness. No significant calf/ankle edema. Labs: Lab Results  Component Value Date   WBC 9.1 07/15/2019   HGB 10.5 (L) 07/15/2019   HCT 34.4 (L) 07/15/2019   MCV 80.4 07/15/2019   PLT 302 07/15/2019   CMP Latest Ref Rng & Units 07/17/2018  Glucose 70 - 99 mg/dL 105(H)  BUN 6 - 20 mg/dL <5(L)  Creatinine 0.44 - 1.00 mg/dL 0.47  Sodium 135 - 145 mmol/L 136  Potassium 3.5 - 5.1 mmol/L 3.7  Chloride 98 - 111 mmol/L 107  CO2 22 - 32 mmol/L 20(L)  Calcium 8.9 - 10.3 mg/dL 8.2(L)    Total Protein 6.5 - 8.1 g/dL 6.2(L)  Total Bilirubin 0.3 - 1.2 mg/dL 0.3  Alkaline Phos 38 - 126 U/L 123  AST 15 - 41 U/L 14(L)  ALT 0 - 44 U/L 12    Discharge instruction: per After Visit Summary and "Baby and Me Booklet".  After visit meds:  Allergies as of 07/17/2019      Reactions   Chlorhexidine Itching, Rash   Severe itching, redness.    Tramadol Nausea And Vomiting      Medication List    STOP taking these medications   acetaminophen 500 MG tablet Commonly known as: TYLENOL   Comfort Fit Maternity Supp Lg Misc     TAKE these medications   Blood Pressure Kit Monitor blood pressure at home regularly extra large O09.90   coconut oil Oil Apply 1 application topically as needed.   docusate sodium 100 MG capsule Commonly known as: COLACE Take 1 capsule (100 mg total) by mouth 2 (two) times daily as needed for mild constipation or moderate constipation.   famotidine 20 MG tablet Commonly known as: Pepcid Take 1 tablet (20 mg total) by mouth 2 (two) times daily.   ferrous sulfate 325 (65 FE) MG tablet TAKE 1 TABLET BY MOUTH TWICE A DAY What  changed: when to take this   ibuprofen 800 MG tablet Commonly known as: ADVIL Take 1 tablet (800 mg total) by mouth every 8 (eight) hours as needed.   Vitafol Gummies 3.33-0.333-34.8 MG Chew Chew 1 tablet by mouth daily.       Diet: routine diet  Activity: Advance as tolerated. Pelvic rest for 6 weeks.   Outpatient follow up:4 weeks Follow up Appt: Future Appointments  Date Time Provider Department Center  08/11/2019  9:15 AM Traci Castro, CNM [facility] None   Follow up Visit:  Please schedule this patient for Postpartum visit in: 4 weeks with the following provider: Any provider For Castro/S patients schedule nurse incision check in weeks 2 weeks: no Low risk pregnancy complicated by: obesity  Delivery mode:  SVD Anticipated Birth Control:  Nexplanon PP Procedures needed: Nexplanon placement   Schedule Integrated BH visit: no  Newborn Data: Live born female  Birth Weight:  3379g APGAR: 9, 9  Newborn Delivery   Birth date/time: 07/15/2019 21:16:00 Delivery type: Vaginal, Spontaneous      Baby Feeding: Breast Disposition:home with mother   07/17/2019 Traci Walsh, MD   I confirm that I have verified the information documented in the resident's note and that I have also personally reperformed the history, physical exam and all medical decision making activities of this service and have verified that all service and findings are accurately documented in this student's note.   See Nexplanon Placement Note.   Traci Hogan DNP, CNM  07/17/19  11:35 AM       

## 2019-07-15 NOTE — Progress Notes (Addendum)
Traci Castro is a 23 y.o. [id number] at [redacted]w[redacted]d admitted for elective induction of labor.  Subjective: Pt having contractions, good fetal movement.  No leakage of fluid, or vaginal bleeding.  Objective: BP (!) 80/56   Pulse 68   Temp 99.7 F (37.6 C) (Oral)   Resp 19   Ht 5\' 3"  (1.6 m)   Wt 136.1 kg   LMP 10/15/2018   SpO2 100%   BMI 53.14 kg/m  No intake/output data recorded.  FHT:  FHR: 120-130 bpm, variability: moderate,  accelerations:  Present,  decelerations:  Absent UC:  Regular 1-2 mins on toco  SVE:   Dilation: 5 Effacement (%): 70 Station: -1 Exam by:: Epifanio Lesches, RNC  Pitocin @ 73mu/min  Labs: Lab Results  Component Value Date   WBC 9.1 07/15/2019   HGB 10.5 (L) 07/15/2019   HCT 34.4 (L) 07/15/2019   MCV 80.4 07/15/2019   PLT 302 07/15/2019    Assessment / Plan: Traci Castro is a 23 y.o [id number] at [redacted]w[redacted]d here for elective induction of labor.  Labor: Pitocin started at 0859, AROM with clear fluid at 1740.  Continue Pitocin Fetal Wellbeing:  Category I Pain Control:  IV meds, epidural if patient requests I/D: GBS positive, PCN  Anticipated MOD:  Vaginal Delivery  Carollee Leitz MD PGY1 Family Practice 07/15/2019, 5:45 PM   GME ATTESTATION:  I saw and evaluated the patient. I personally AROMed the patient. I agree with the findings and the plan of care as documented in the resident's note.  Merilyn Baba, DO OB Fellow [place] for [employer]

## 2019-07-15 NOTE — Progress Notes (Signed)
Patient ID: Traci Castro, female   DOB: 1996/06/22, 23 y.o.   MRN: [id number]  Feeling ctx stronger; AROM @ 1740; PCN x 3 doses  BP 91/47, P 106 FHR 140s, +accels, no decels, occ variables Ctx q 2-3 mins with Pit at 30mu/min Cx @ 1900> 6/70/vtx -2  IUP@30 .0wks Early active labor GBS pos  Anticipate vag del  Myrtis Ser CNM 07/15/2019 7:40 PM

## 2019-07-16 ENCOUNTER — Encounter (HOSPITAL_COMMUNITY): Payer: Self-pay

## 2019-07-16 MED ORDER — MEASLES, MUMPS & RUBELLA VAC IJ SOLR
0.5000 mL | Freq: Once | INTRAMUSCULAR | Status: AC
  Administered 2019-07-17: 0.5 mL via SUBCUTANEOUS
  Filled 2019-07-16: qty 0.5

## 2019-07-16 NOTE — Progress Notes (Signed)
Post Partum Day 1  Subjective: up ad lib, voiding and tolerating PO Traci Castro is doing well this morning; no acute events overnight. Patient complains of difficulty moving and walking secondary to abdominal discomfort, but does report adequate pain control. Otherwise patient endorses urination, appropriate appetite; she denies shortness of breath, chest pain, lightheadedness, lower extremity pain, headache or lightheadedness.    Objective: Blood pressure (!) 112/55, pulse 64, temperature 98.2 F (36.8 C), temperature source Oral, resp. rate 18, height 5\' 3"  (1.6 m), weight 136.1 kg, last menstrual period 10/15/2018, SpO2 100 %, unknown if currently breastfeeding.  Physical Exam:  General: alert, cooperative, appears stated age, no distress and morbidly obese Lochia: appropriate Uterine Fundus: firm DVT Evaluation: No evidence of DVT seen on physical exam. No significant calf/ankle edema.  Recent Labs    07/15/19 0830  HGB 10.5*  HCT 34.4*    Assessment/Plan: Plan for discharge tomorrow, Breastfeeding and Contraception Nexplanon  Circumcision -[employer], patient desires outpatient circumcision    LOS: 1 day   Mercy Moore 07/16/2019, 9:40 AM

## 2019-07-16 NOTE — Lactation Note (Signed)
This note was copied from a baby's chart. Lactation Consultation Note  Patient Name: Traci Castro GYJEH'U Date: 07/16/2019 Reason for consult: Initial assessment   P3, Baby is 30 hours old.  Mother is breast and formula feeding. She breastfed her other children for 3 mos & 6 mos. Observed mother hand express good flow and latched baby briefly in football hold. Mother is pumping and supplementing with formula with slow flow nipple by choice. Feed on demand with cues.  Goal 8-12+ times per day after first 24 hrs.  Place baby STS if not cueing.  Mom made aware of O/P services, breastfeeding support groups, community resources, and our phone # for post-discharge questions.     Maternal Data Has patient been taught Hand Expression?: Yes Does the patient have breastfeeding experience prior to this delivery?: Yes  Feeding Feeding Type: Breast Fed  LATCH Score Latch: Repeated attempts needed to sustain latch, nipple held in mouth throughout feeding, stimulation needed to elicit sucking reflex.  Audible Swallowing: A few with stimulation  Type of Nipple: Everted at rest and after stimulation  Comfort (Breast/Nipple): Soft / non-tender  Hold (Positioning): No assistance needed to correctly position infant at breast.  LATCH Score: 8  Interventions Interventions: Breast feeding basics reviewed;Hand express;DEBP  Lactation Tools Discussed/Used     Consult Status Consult Status: Follow-up Date: 07/17/19 Follow-up type: In-patient    [name] [facility] 07/16/2019, 2:40 PM

## 2019-07-16 NOTE — Clinical Social Work Maternal (Signed)
CLINICAL SOCIAL WORK MATERNAL/CHILD NOTE  Patient Details  Name: Traci Castro MRN: [id number] Date of Birth: 1995-11-17  Date:  07/16/2019  Clinical Social Worker Initiating Note:  Elijio Miles Date/Time: Initiated:  07/16/19/0904     Child's Name:  Undecided but leaning towards [place] Parents:  Mother, Father(Rosy Rollo and Myer Peer DOB: 10/06/1985)   Need for Interpreter:  None   Reason for Referral:  Behavioral Health Concerns, Other (Comment)(Housing concerns noted in early pregnancy)   Address:  [address] [place], [place] [address]   Phone number:  [phone] (home)     Additional phone number:   Household Members/Support Persons (HM/SP):   Household Member/Support Person 1, Household Member/Support Person 2, Household Member/Support Person 3, Household Member/Support Person 4, Household Member/Support Person 5   HM/SP Name Relationship DOB or Age  HM/SP -1 Mizrahim Zannie Kehr 05/05/2016  HM/SP -2 Sincere Owens Shark Daughter 07/23/2018  HM/SP -3 Kathi Simpers Mother    HM/SP -4   Sibling    HM/SP -5   Sibling    HM/SP -6        HM/SP -7        HM/SP -8          Natural Supports (not living in the home):  Immediate Family   Professional Supports: None   Employment: Retail buyer, Animator   Type of Work: Paediatric nurse and [employer]   Education:  [place] arranged:    Museum/gallery curator Resources:  Multimedia programmer   Other Resources:  Physicist, medical , [place] Considerations Which May Impact Care:    Strengths:  Ability to meet basic needs , Home prepared for child , Pediatrician chosen   Psychotropic Medications:         Pediatrician:    [place] area  Pediatrician List:   [facility]      Pediatrician Fax Number:    Risk Factors/Current Problems:   Mental Health Concerns    Cognitive State:  Able to Concentrate , Alert , Linear Thinking    Mood/Affect:  Calm , Comfortable , Relaxed    CSW Assessment:  CSW received consult for history of anxiety, depression, bipolar and housing concerns noted in early pregnancy.  CSW met with MOB to offer support and complete assessment.    MOB resting in bed with infant asleep in bassinet and FOB asleep at bedside, when CSW entered the room. CSW introduced self and received verbal permission from MOB to complete assessment with FOB asleep at bedside. CSW explained reason for consult to which MOB expressed understanding. MOB reported she currently lives with her mother, two children and two siblings in [place]. CSW inquired about housing concerns noted in early pregnancy and MOB stated she is able to return to live with her mother at discharge. MOB stated she currently works part-time at both [employer] and [employer] and receives both [employer] and [employer]. MOB aware she needs to notify both of her delivery to get infant added to plans. CSW inquired about MOB's mental health history and MOB acknowledged being diagnosed with "bipolar 1 and 2, depression, anxiety, anger, PTSD and low range schizophrenia". MOB expressed that she felt ups and downs during her pregnancy and feels she still has symptoms of PPD from her previous pregnancy. MOB stated she  is not currently on medications but intends to restart them. MOB reported she was previously receiving her medications through [facility] and that she intends to set an appointment for after discharge. MOB stated she was also previously active with a therapist, Valora Piccolo, and intends to make an appointment with her also. CSW provided education regarding the baby blues period vs. perinatal mood disorders, discussed treatment and gave resources for mental health follow up if concerns arise. CSW recommends self-evaluation during the postpartum time period  using the New Mom Checklist from Postpartum Progress and encouraged MOB to contact a medical professional if symptoms are noted at any time. MOB did not appear to be displaying any acute mental health symptoms and denied any current SI, HI or DV. MOB reported having good support from her mother and grandmother. CSW also to make [facility] and [facility] referrals with permission from MOB.    MOB confirmed having all essential items for infant once discharged and reported infant would be sleeping in a cib once home. CSW provided review of Sudden Infant Death Syndrome (SIDS) precautions and safe sleeping habits.     CSW Plan/Description:  No Further Intervention Required/No Barriers to Discharge, Sudden Infant Death Syndrome (SIDS) Education, Perinatal Mood and Anxiety Disorder (PMADs) Education    [place], [place] 07/16/2019, 10:31 AM

## 2019-07-17 DIAGNOSIS — Z30017 Encounter for initial prescription of implantable subdermal contraceptive: Secondary | ICD-10-CM

## 2019-07-17 MED ORDER — SERTRALINE HCL 50 MG PO TABS: 50.0000 mg | ORAL_TABLET | Freq: Every day | ORAL | 1 refills | Status: DC

## 2019-07-17 MED ORDER — SERTRALINE HCL 50 MG PO TABS
50.0000 mg | ORAL_TABLET | Freq: Every day | ORAL | Status: DC
  Administered 2019-07-17: 50 mg via ORAL
  Filled 2019-07-17: qty 1

## 2019-07-17 MED ORDER — COCONUT OIL OIL: 1.0000 "application " | TOPICAL_OIL | 0 refills | Status: DC | PRN

## 2019-07-17 MED ORDER — IBUPROFEN 800 MG PO TABS: 800.0000 mg | ORAL_TABLET | Freq: Three times a day (TID) | ORAL | 0 refills | Status: DC | PRN

## 2019-07-17 MED ORDER — LIDOCAINE HCL 1 % IJ SOLN
0.0000 mL | Freq: Once | INTRAMUSCULAR | Status: AC | PRN
  Administered 2019-07-17: 3 mL via INTRADERMAL
  Filled 2019-07-17: qty 20

## 2019-07-17 MED ORDER — ETONOGESTREL 68 MG ~~LOC~~ IMPL
68.0000 mg | DRUG_IMPLANT | Freq: Once | SUBCUTANEOUS | Status: AC
  Administered 2019-07-17: 68 mg via SUBCUTANEOUS
  Filled 2019-07-17: qty 1

## 2019-07-17 NOTE — Lactation Note (Signed)
This note was copied from a baby's chart. Lactation Consultation Note  Patient Name: Traci Castro Date: 07/17/2019 Reason for consult: Follow-up assessment;Term;Infant weight loss;Other (Comment);Early term 37-38.6wks(6 % weight loss) As LC entered the room . Baby asleep in moms lap, mom resting in bed and dad on the couch.  Per mom baby recently fed and LC updated the doc flow sheets.  DEBP at bedside and mom has been pumping.  CNM entered the room and LC stepped out and will F/U.   Maternal Data    Feeding Feeding Type: (per mom baby recently fed) Nipple Type: Slow - flow  LATCH Score                   Interventions Interventions: Breast feeding basics reviewed  Lactation Tools Discussed/Used Pump Review: Milk Storage(DEBP already set up and mom has been pumping)   Consult Status Consult Status: Follow-up Date: 07/17/19 Follow-up type: In-patient    [place] 07/17/2019, 10:45 AM

## 2019-07-17 NOTE — Progress Notes (Signed)
Post Partum Day 2  vaginal delivery, elective induction for term gestation  Subjective: up ad lib, voiding and tolerating PO  Traci Castro is doing well this morning; though, she does report tiredness and abdominal cramping with breastfeeding. Otherwise, patient denies shortness of breath, lightheadedness, chest pain, lower extremity pain or swelling, headache or visual changes.    Objective: Blood pressure 114/69, pulse 74, temperature 98.2 F (36.8 C), temperature source Oral, resp. rate 18, height 5\' 3"  (1.6 m), weight 136.1 kg, last menstrual period 10/15/2018, SpO2 100 %, unknown if currently breastfeeding.  Physical Exam:  General: alert, cooperative, appears stated age, no distress and morbidly obese Lochia: appropriate Uterine Fundus: firm DVT Evaluation: No evidence of DVT seen on physical exam. No significant calf/ankle edema.  Recent Labs    07/15/19 0830  HGB 10.5*  HCT 34.4*    Assessment/Plan: Discharge home, Breastfeeding and Contraception Nexplanon  Contraception -inpatient Nexplanon insertion today prior to discharge  Circumcision -[employer], patient desires outpatient circumcision   Psychiatric History -social consult yesterday, patient shared history of several psychiatric diagnoses and continued struggle with symptoms of postpartum depression -patient not on medications but previously received meds through [place] and treatment with therapist -patient expresses intent to resume treatment -outpatient monitoring and follow up will be important   LOS: 2 days   Traci Castro Anon 07/17/2019, 9:09 AM

## 2019-07-17 NOTE — Lactation Note (Signed)
This note was copied from a baby's chart. Lactation Consultation Note  Patient Name: Traci Castro WEXHB'Z Date: 07/17/2019 Reason for consult: Follow-up assessment;Term;Infant weight loss;Other (Comment)(6 % / 2nd [place] visit to finish consult)  2nd [place] visit , and mom updated LC with recent feeding, wet and stool.  Mom denies soreness, had some engorgement during and pumping resolved it.  Per mom has [place] at home and plans to work on latching and pumping. Sore nipple and engorgement prevention and tx reviewed.  Storage of breast milk.  Mom aware of the [facility] O/P and has the pamphlet with phone numbers.    Maternal Data    Feeding Feeding Type: (per mom the baby finished the 8 ml from the feeding at 9:45 am) Nipple Type: Slow - flow  LATCH Score                   Interventions Interventions: Breast feeding basics reviewed  Lactation Tools Discussed/Used Tools: Pump Breast pump type: Double-Electric Breast Pump Pump Review: Milk Storage   Consult Status Consult Status: Complete Date: 07/17/19 Follow-up type: In-patient    [place] 07/17/2019, 12:06 PM

## 2019-07-17 NOTE — Progress Notes (Signed)
Postpartum Nexplanon Placement    Nexplanon Insertion Procedure Patient identified, informed consent performed, consent signed.   Patient does understand that irregular bleeding is a very common side effect of this medication. She was advised to have backup contraception for one week after placement.  Appropriate time out taken.  Patient's left arm was prepped and draped in the usual sterile fashion. The ruler used to measure and mark insertion area.  Patient was prepped with alcohol swab and then injected with 3 ml of 1% lidocaine.  She was prepped with betadine, Nexplanon removed from packaging,  Device confirmed in needle, then inserted full length of needle and withdrawn per handbook instructions. Nexplanon was able to palpated in the patient's arm; patient palpated the insert herself. There was minimal blood loss.  Patient insertion site covered with guaze and a pressure bandage to reduce any bruising.  The patient tolerated the procedure well and was given post procedure instructions.   Marcille Buffy DNP, CNM  07/17/19  11:33 AM

## 2019-07-17 NOTE — Progress Notes (Addendum)
CSW informed by RN that MOB had completed Edinburgh Depression Screen and scored a 25. CSW met with MOB at bedside to address concerns and politely asked that FOB step out of the room so that CSW could meet with MOB in private. FOB left voluntarily. MOB voiced feeling overwhelmed with taking infant home with 2 other children at home as well. CSW assessed for safety and MOB denied any current SI or HI. MOB voiced interest in being started on medications and stated she had tried to reach out to [facility] but hasn't been able to get an appointment. CSW spoke with Faculty Practice who recommended CSW try to get patient in to see Vesta Mixer, [facility] with [facility], on Monday or Tuesday. CSW spoke with Roselyn Reef and was informed that she was booked for both Monday and Tuesday and would likely not be able to meet with patient until the following week. CSW spoke with Faculty Practice again who reported they would start MOB on Zoloft 37m. CSW relayed this information to MOB and informed her JRoselyn Reefwould be in contact. MOB appreciative of medication and denied any further questions or concerns.  MElijio Miles LFort Worth Women's and CMolson Coors [phone]

## 2019-08-11 ENCOUNTER — Encounter: Payer: Self-pay | Admitting: Certified Nurse Midwife

## 2019-08-11 ENCOUNTER — Telehealth (INDEPENDENT_AMBULATORY_CARE_PROVIDER_SITE_OTHER): Payer: 59 | Admitting: Certified Nurse Midwife

## 2019-08-11 DIAGNOSIS — M5440 Lumbago with sciatica, unspecified side: Secondary | ICD-10-CM

## 2019-08-11 DIAGNOSIS — Z1389 Encounter for screening for other disorder: Secondary | ICD-10-CM

## 2019-08-11 DIAGNOSIS — O99345 Other mental disorders complicating the puerperium: Secondary | ICD-10-CM

## 2019-08-11 DIAGNOSIS — F53 Postpartum depression: Secondary | ICD-10-CM

## 2019-08-11 MED ORDER — SERTRALINE HCL 100 MG PO TABS: 100.0000 mg | ORAL_TABLET | Freq: Every day | ORAL | 2 refills | Status: DC

## 2019-08-11 MED ORDER — CYCLOBENZAPRINE HCL 10 MG PO TABS: 10.0000 mg | ORAL_TABLET | Freq: Three times a day (TID) | ORAL | 0 refills | Status: DC | PRN

## 2019-08-11 NOTE — Progress Notes (Signed)
TELEHEALTH POSTPARTUM VIRTUAL VIDEO VISIT ENCOUNTER NOTE  Provider location: Center for [employer] at [place]   I connected with Traci Castro on 08/11/19 at  9:15 AM EST by MyChart Video Encounter at home and verified that I am speaking with the correct person using two identifiers.    I discussed the limitations, risks, security and privacy concerns of performing an evaluation and management service virtually and the availability of in person appointments. I also discussed with the patient that there may be a patient responsible charge related to this service. The patient expressed understanding and agreed to proceed.  Chief Complaint: Postpartum Visit  History of Present Illness: Traci Castro is a 23 y.o. African-American [id number] being evaluated for postpartum followup.    She is s/p normal spontaneous vaginal delivery on 07/15/2019 at 39 weeks; she was discharged to home on 11/20/2020D#2. She is 4 weeks postpartum following a spontaneous vaginal delivery. I have fully reviewed the prenatal and intrapartum course. Postpartum course has been uncomplicated. Baby is doing well. Postpartum depression screening:positive EPDS = 16  Complains of fatigue  Vaginal bleeding or discharge: yes, spotting Intercourse: No  Contraception: Nexplanon placed prior to discharge home  Mode of feeding infant: Breast PP depression s/s: Yes .  Any bowel or bladder issues: No  Pap smear: no abnormalities (date: 01/2018)  Review of Systems: Positive for positive postpartum depression screening.   Patient Active Problem List   Diagnosis Date Noted  . [name] 04/08/2019  . Hypotension, unspecified 07/16/2018  . Obesity during pregnancy, antepartum 07/16/2018  . [name], antepartum 01/31/2018  . [name] 01/31/2018  . BMI 45.0-49.9, adult ([place]) 03/15/2016    Medications Traci Castro had no medications administered  during this visit. Current Outpatient Medications  Medication Sig Dispense Refill  . Blood Pressure KIT Monitor blood pressure at home regularly extra large O09.90 1 kit 0  . ibuprofen (ADVIL) 800 MG tablet Take 1 tablet (800 mg total) by mouth every 8 (eight) hours as needed. 30 tablet 0  . Prenatal Vit-Fe Phos-FA-Omega (VITAFOL GUMMIES) 3.33-0.333-34.8 MG CHEW Chew 1 tablet by mouth daily. 90 tablet 5  . sertraline (ZOLOFT) 50 MG tablet Take 1 tablet (50 mg total) by mouth daily. 30 tablet 1  . coconut oil OIL Apply 1 application topically as needed. (Patient not taking: Reported on 08/11/2019) 100 mL 0  . docusate sodium (COLACE) 100 MG capsule Take 1 capsule (100 mg total) by mouth 2 (two) times daily as needed for mild constipation or moderate constipation. (Patient not taking: Reported on 07/15/2019) 30 capsule 2  . famotidine (PEPCID) 20 MG tablet Take 1 tablet (20 mg total) by mouth 2 (two) times daily. (Patient not taking: Reported on 08/11/2019) 60 tablet 0  . ferrous sulfate 325 (65 FE) MG tablet TAKE 1 TABLET BY MOUTH TWICE A DAY (Patient not taking: No sig reported) 60 tablet 1   No current facility-administered medications for this visit.    Allergies Chlorhexidine and Tramadol  Physical Exam:  BP 113/78   LMP 10/15/2018   General:  Alert, oriented and cooperative. Patient is in no acute distress.  Mental Status: Normal mood and affect. Normal behavior. Normal judgment and thought content.   Respiratory: Normal respiratory effort noted, no problems with respiration noted  Rest of physical exam deferred due to type of encounter  PP Depression Screening:   Edinburgh Postnatal Depression Scale Screening Tool 08/11/2019 07/17/2019 07/17/2019 07/16/2019 07/16/2019  I have  been able to laugh and see the funny side of things. 1 3 (No Data) (No Data) (No Data)  I have looked forward with enjoyment to things. 3 2 - - -  I have blamed myself unnecessarily when things went wrong. 3 3  - - -  I have been anxious or worried for no good reason. 0 2 - - -  I have felt scared or panicky for no good reason. 2 3 - - -  Things have been getting on top of me. 2 3 - - -  I have been so unhappy that I have had difficulty sleeping. 0 3 - - -  I have felt sad or miserable. 3 3 - - -  I have been so unhappy that I have been crying. 2 3 - - -  The thought of harming myself has occurred to me. 0 0 - - -  Edinburgh Postnatal Depression Scale Total 16 58 - - -     Assessment:Patient is a 23 y.o. [id number] who is 4 weeks postpartum from a normal spontaneous vaginal delivery.  Plan: 1. Postpartum care and examination - Abnormal postpartum examination d/t increased EPDS score   2. Acute right-sided low back pain with sciatica, sciatica laterality unspecified - Patient reports continued back pain since delivery  - Has tried Tylenol, hot packs, ice and ibuprofen with no relief  - Educated and discussed Flexeril for short course, Rx sent to pharmacy of choice  - cyclobenzaprine (FLEXERIL) 10 MG tablet; Take 1 tablet (10 mg total) by mouth every 8 (eight) hours as needed for muscle spasms.  Dispense: 30 tablet; Refill: 0  3. Postpartum depression - Patient currently taking Zoloft for PPD  - EPDS down from 25 to 13  - Patient reports continue fatigue and not wanting to get up some days  - Educated and discussed ability to increase zoloft to higher dose with mood check in 2-3 weeks- patient agrees to plan of care  - sertraline (ZOLOFT) 100 MG tablet; Take 1 tablet (100 mg total) by mouth daily.  Dispense: 30 tablet; Refill: 2   I discussed the assessment and treatment plan with the patient. The patient was provided an opportunity to ask questions and all were answered. The patient agreed with the plan and demonstrated an understanding of the instructions.   The patient was advised to call back or seek an in-person evaluation/go to the ED for any concerning postpartum symptoms.  I provided  15 minutes of face-to-face time during this encounter.   Lajean Manes, [place] for [employer], [place]

## 2019-08-24 ENCOUNTER — Telehealth: Payer: Self-pay

## 2019-08-24 NOTE — Telephone Encounter (Signed)
Pt called clinic reporting a sharp pain above her naval.  She stated that she notices the pain after she eats something greasy or with high acidity foods/drinks. She is wanting to know what could it be and how to treat the pain. I was thinking maybe GERD or an hit hernia but I'm not sure. Please advise. -EH/RMA

## 2019-08-25 ENCOUNTER — Encounter: Payer: Self-pay | Admitting: Obstetrics & Gynecology

## 2019-09-01 ENCOUNTER — Ambulatory Visit: Payer: Medicaid Other | Admitting: Advanced Practice Midwife

## 2019-09-01 ENCOUNTER — Other Ambulatory Visit: Payer: Self-pay

## 2019-09-01 DIAGNOSIS — Z20822 Contact with and (suspected) exposure to covid-19: Secondary | ICD-10-CM

## 2019-09-02 ENCOUNTER — Other Ambulatory Visit: Payer: Self-pay | Admitting: Certified Nurse Midwife

## 2019-09-02 DIAGNOSIS — F53 Postpartum depression: Secondary | ICD-10-CM

## 2019-09-03 ENCOUNTER — Telehealth: Payer: Self-pay | Admitting: Critical Care Medicine

## 2019-09-03 LAB — NOVEL CORONAVIRUS, NAA: SARS-CoV-2, NAA: DETECTED — AB

## 2019-09-03 NOTE — Telephone Encounter (Signed)
I connected with this patient who is positive for Covid from a January 5 testing event.  She has minimal symptoms.  She is not a candidate for monoclonal antibody.  She knows isolation time necessary.

## 2019-09-22 ENCOUNTER — Ambulatory Visit: Payer: Medicaid Other | Attending: Internal Medicine

## 2019-09-22 DIAGNOSIS — Z20822 Contact with and (suspected) exposure to covid-19: Secondary | ICD-10-CM

## 2019-09-23 LAB — NOVEL CORONAVIRUS, NAA: SARS-CoV-2, NAA: NOT DETECTED

## 2020-01-04 ENCOUNTER — Other Ambulatory Visit: Payer: Self-pay

## 2020-01-04 ENCOUNTER — Ambulatory Visit (INDEPENDENT_AMBULATORY_CARE_PROVIDER_SITE_OTHER): Payer: BC Managed Care – PPO

## 2020-01-04 ENCOUNTER — Other Ambulatory Visit (HOSPITAL_COMMUNITY)
Admission: RE | Admit: 2020-01-04 | Discharge: 2020-01-04 | Disposition: A | Discharge status: Home / Self Care | Payer: BC Managed Care – PPO | Source: Ambulatory Visit

## 2020-01-04 VITALS — BP 111/74 | HR 84 | Ht 63.0 in | Wt 308.0 lb

## 2020-01-04 DIAGNOSIS — A749 Chlamydial infection, unspecified: Secondary | ICD-10-CM | POA: Insufficient documentation

## 2020-01-04 DIAGNOSIS — Z3046 Encounter for surveillance of implantable subdermal contraceptive: Secondary | ICD-10-CM

## 2020-01-04 DIAGNOSIS — N939 Abnormal uterine and vaginal bleeding, unspecified: Secondary | ICD-10-CM

## 2020-01-04 DIAGNOSIS — B9689 Other specified bacterial agents as the cause of diseases classified elsewhere: Secondary | ICD-10-CM | POA: Insufficient documentation

## 2020-01-04 DIAGNOSIS — N760 Acute vaginitis: Secondary | ICD-10-CM | POA: Insufficient documentation

## 2020-01-04 MED ORDER — TRANEXAMIC ACID 650 MG PO TABS: 1300.0000 mg | ORAL_TABLET | Freq: Three times a day (TID) | ORAL | 0 refills | Status: DC

## 2020-01-04 MED ORDER — IBUPROFEN 800 MG PO TABS: 800.0000 mg | ORAL_TABLET | Freq: Three times a day (TID) | ORAL | 0 refills | Status: DC | PRN

## 2020-01-04 NOTE — Progress Notes (Signed)
GYNECOLOGY PROBLEM OFFICE VISIT NOTE  History:  24 y.o. [id number] here today for abnormal bleeding in setting of Nexplanon that was inserted Jul 17, 2019 after a SVD.  Patient states she has been bleeding for 3-4 weeks.  She reports a "normal" period in April which she reports as as light flow that lasts 4 days.  Patient reports that her current menses is heavy and without clots.  Patient reports some cramping, but denies pelvic pain.  Patient reports that she uses 3-4 overnight pads daily.  She reports that she has also started to have some migraine headaches with her periods which she endorses a history. She states she has taken ibuprofen for her cramping with no relief of her symptoms.    Patient reports safety and overall satisfaction with her Nexplanon, but is dissatisfied with bleeding. She reports having a Nexplanon in the past with good results which she reports as no menses.    She denies a change in partner and vaginal discharge prior to the bleeding. Patient further denies vaginal concerns including itching, burning, and odor.  Patient accepts STD testing.   Past Medical History:  Diagnosis Date  . Anemia   . Anxiety    no meds  . Bipolar disorder ([place])   . Blood transfusion without reported diagnosis    for pp anemia not PPH  . Depression    no meds  . Encounter for induction of labor 07/15/2019  . Gastritis    2 bouts  . H/O seasonal allergies   . Migraine    migraines  . Psoriasis   . SVD (spontaneous vaginal delivery)    x 1  . Vision abnormalities    wears glasses    Past Surgical History:  Procedure Laterality Date  . DILATION AND CURETTAGE OF UTERUS    . DILATION AND EVACUATION N/A 09/20/2017   Procedure: SUCTION DILATATION AND EVACUATION;  Surgeon: Aletha Halim, MD;  Location: [facility];  Service: Gynecology;  Laterality: N/A;  . ESOPHAGOGASTRODUODENOSCOPY N/A 10/17/2012   Procedure: ESOPHAGOGASTRODUODENOSCOPY (EGD);  Surgeon: Oletha Blend, MD;  Location:  [facility] [facility];  Service: Gastroenterology;  Laterality: N/A;  . THERAPEUTIC ABORTION  09/16/2017   Clinic  . WISDOM TOOTH EXTRACTION      The following portions of the patient's history were reviewed and updated as appropriate: allergies, current medications, past family history, past medical history, past social history, past surgical history and problem list.   Health Maintenance:  Normal pap and negative HRHPV on June 2019.    Review of Systems:  Genito-Urinary ROS: no dysuria, trouble voiding, or hematuria  No constipation or diarrhea.   Objective:  Vitals: BP 111/74   Pulse 84   Ht 5\' 3"  (1.6 m)   Wt (!) 308 lb (139.7 kg)   LMP 12/03/2019   Breastfeeding No   BMI 54.56 kg/m   Physical Exam: Physical Exam Constitutional:      Appearance: Normal appearance. She is obese.  HENT:     Head: Normocephalic and atraumatic.  Eyes:     Conjunctiva/sclera: Conjunctivae normal.  Cardiovascular:     Rate and Rhythm: Normal rate and regular rhythm.     Heart sounds: Normal heart sounds.  Pulmonary:     Effort: Pulmonary effort is normal.     Breath sounds: Normal breath sounds.  Abdominal:     Palpations: Abdomen is soft.  Musculoskeletal:        General: Normal range of motion.     Cervical back:  Normal range of motion.  Neurological:     Mental Status: She is alert and oriented to person, place, and time.  Skin:    General: Skin is warm and dry.     Comments: Scarring on legs c/w acne and/or picking of skin.  Scars in various stages of healing.   Psychiatric:        Mood and Affect: Mood normal.        Behavior: Behavior normal.        Thought Content: Thought content normal.      Labs and Imaging: No results found.  Assessment & Plan:  24 year old Abnormal Uterine Bleeding Nexplanon Not Palpated H/O Migraines  -Patient to collect CV swab. -Discussed how COCs are contraindicated in setting of migraines. -Consulted with Dr. Demetrius Charity. Constant who recommends TXA x 5  days. -Patient updated on plan of care and agreeable. -Requests and given ibuprofen for intermittent cramping.  -Discussed need to use PPI or H2 Antagonist with long term use. Instructed to use no more than 5 days in a row.  -Informed of need for [place] to locate Nexplanon device. -Order placed for Nonvascular [place] of Left Arm. -Will review results with patient as appropriate.    Total face-to-face time with patient: 15 minutes   Gerrit Heck, [place] 01/04/2020 3:23 PM

## 2020-01-04 NOTE — Progress Notes (Signed)
Pt is in the office complaining of AUB with nexplanon, inserted 07-15-19 after delivery. Pt states that she has been bleeding heavily for the last 3-4 weeks. Pt states she is changing pad every hour.

## 2020-01-05 ENCOUNTER — Other Ambulatory Visit: Payer: Self-pay

## 2020-01-05 LAB — CERVICOVAGINAL ANCILLARY ONLY
Bacterial Vaginitis (gardnerella): POSITIVE — AB
Candida Glabrata: NEGATIVE
Candida Vaginitis: NEGATIVE
Chlamydia: POSITIVE — AB
Comment: NEGATIVE
Comment: NEGATIVE
Comment: NEGATIVE
Comment: NEGATIVE
Comment: NEGATIVE
Comment: NORMAL
Neisseria Gonorrhea: NEGATIVE
Trichomonas: NEGATIVE

## 2020-01-05 MED ORDER — DOXYCYCLINE HYCLATE 100 MG PO CAPS: 100.0000 mg | ORAL_CAPSULE | Freq: Two times a day (BID) | ORAL | 0 refills | Status: DC

## 2020-01-05 NOTE — Progress Notes (Signed)
Pt called asking about her vaginal swab test results from yesterday because she saw the results on mychart. I advised the patient that I will send the appropriate medication to her pharmacy to treat the infection. Advised pt that her partner will need to be tested and treated as well and to refrain from IC until after both have been treated. Advised pt to call and schedule an appointment for TOC in 3 weeks. Pt voices understanding. Health Dept notification faxed.

## 2020-01-18 ENCOUNTER — Ambulatory Visit (HOSPITAL_COMMUNITY)
Admission: RE | Admit: 2020-01-18 | Discharge: 2020-01-18 | Disposition: A | Discharge status: Home / Self Care | Payer: BC Managed Care – PPO | Source: Ambulatory Visit

## 2020-01-18 ENCOUNTER — Other Ambulatory Visit: Payer: Self-pay

## 2020-01-18 DIAGNOSIS — Z3046 Encounter for surveillance of implantable subdermal contraceptive: Secondary | ICD-10-CM | POA: Insufficient documentation

## 2020-01-18 DIAGNOSIS — Z975 Presence of (intrauterine) contraceptive device: Secondary | ICD-10-CM | POA: Diagnosis not present

## 2020-01-20 ENCOUNTER — Ambulatory Visit: Payer: BC Managed Care – PPO | Admitting: Family

## 2020-01-26 ENCOUNTER — Ambulatory Visit: Payer: BC Managed Care – PPO | Admitting: Family

## 2020-01-27 ENCOUNTER — Telehealth: Payer: Self-pay | Admitting: Family

## 2020-01-27 NOTE — Telephone Encounter (Signed)
Patient has no-showed 2 different new patients appointments at [facility]. She is not to be re-scheduled at this office.

## 2020-02-15 ENCOUNTER — Other Ambulatory Visit: Payer: Self-pay

## 2020-02-15 MED ORDER — METRONIDAZOLE 500 MG PO TABS: 500.0000 mg | ORAL_TABLET | Freq: Two times a day (BID) | ORAL | 0 refills | Status: DC

## 2020-02-15 MED ORDER — FLUCONAZOLE 150 MG PO TABS
150.0000 mg | ORAL_TABLET | Freq: Once | ORAL | 0 refills | Status: AC
Start: 2020-02-15 — End: 2020-02-15

## 2020-02-15 NOTE — Progress Notes (Signed)
Pt called reporting that she has an fishy odor and also itching.  She was just treated for BV and Chlamydia. Another round of metronidazole was sent to the pharmacy along with one dose of diflucan.  Pt advised that if no relief after this treatment then she needs see a provider. -EH/RMA

## 2020-03-16 ENCOUNTER — Ambulatory Visit (HOSPITAL_COMMUNITY): Payer: Self-pay

## 2020-04-21 ENCOUNTER — Encounter: Payer: Self-pay | Admitting: Women's Health

## 2020-04-21 ENCOUNTER — Other Ambulatory Visit: Payer: Self-pay

## 2020-04-21 ENCOUNTER — Ambulatory Visit (INDEPENDENT_AMBULATORY_CARE_PROVIDER_SITE_OTHER): Payer: BC Managed Care – PPO | Admitting: Women's Health

## 2020-04-21 ENCOUNTER — Other Ambulatory Visit (HOSPITAL_COMMUNITY)
Admission: RE | Admit: 2020-04-21 | Discharge: 2020-04-21 | Disposition: A | Discharge status: Home / Self Care | Payer: BC Managed Care – PPO | Source: Ambulatory Visit | Attending: Women's Health | Admitting: Women's Health

## 2020-04-21 VITALS — BP 130/84 | HR 85 | Wt 328.0 lb

## 2020-04-21 DIAGNOSIS — N898 Other specified noninflammatory disorders of vagina: Secondary | ICD-10-CM | POA: Insufficient documentation

## 2020-04-21 NOTE — Progress Notes (Signed)
  History:  Ms. Traci Castro is a 24 y.o. [phone] who presents to clinic today for vaginal itching x 1 week. Pt denies odor. Pt has not looked at vaginal discharge but reports there is brown sometimes in her underwear. Patient has not tried any OTC treatments yet.  The following portions of the patient's history were reviewed and updated as appropriate: allergies, current medications, family history, past medical history, social history, past surgical history and problem list.  Review of Systems:  Review of Systems  Gastrointestinal: Negative for abdominal pain, nausea and vomiting.  Genitourinary: Negative for dysuria, frequency and urgency.       Vaginal discharge     Objective:  Physical Exam BP 130/84   Pulse 85   Wt (!) 328 lb (148.8 kg)   BMI 58.10 kg/m  Physical Exam Vitals and nursing note reviewed. Exam conducted with a chaperone present.  Constitutional:      Appearance: Normal appearance.  HENT:     Head: Normocephalic and atraumatic.  Pulmonary:     Effort: Pulmonary effort is normal.  Abdominal:     Palpations: Abdomen is soft.  Genitourinary:    General: Normal vulva.     Labia:        Right: No rash, tenderness or lesion.        Left: No rash, tenderness or lesion.   Neurological:     Mental Status: Traci Castro is alert and oriented to person, place, and time.  Psychiatric:        Mood and Affect: Mood normal.        Behavior: Behavior normal.        Thought Content: Thought content normal.        Judgment: Judgment normal.    Labs and Imaging No results found for this or any previous visit (from the past 24 hour(s)).  No results found.   Assessment & Plan:  1. Vaginal itching - Cervicovaginal ancillary only( [place])    Approximately 5 minutes of total time was spent with this patient on exam, counseling and chart review.  Behr Cislo, Odie Sera, NP 04/21/2020 10:35 AM

## 2020-04-21 NOTE — Progress Notes (Signed)
RGYN patient presents for problem visit pt complains of vaginal discharge and itching possible yeast infection x 1 week infection. Pt has not tried any OTC treatment medications.   Pt requested STD screening.   Contraception: Nexplanon  LMP No cycles with birth control.

## 2020-04-22 LAB — CERVICOVAGINAL ANCILLARY ONLY
Bacterial Vaginitis (gardnerella): POSITIVE — AB
Candida Glabrata: NEGATIVE
Candida Vaginitis: NEGATIVE
Chlamydia: NEGATIVE
Comment: NEGATIVE
Comment: NEGATIVE
Comment: NEGATIVE
Comment: NEGATIVE
Comment: NEGATIVE
Comment: NORMAL
Neisseria Gonorrhea: NEGATIVE
Trichomonas: NEGATIVE

## 2020-04-24 ENCOUNTER — Other Ambulatory Visit: Payer: Self-pay | Admitting: Women's Health

## 2020-04-24 DIAGNOSIS — N760 Acute vaginitis: Secondary | ICD-10-CM

## 2020-04-24 MED ORDER — METRONIDAZOLE 500 MG PO TABS: 500.0000 mg | ORAL_TABLET | Freq: Two times a day (BID) | ORAL | 0 refills | Status: AC

## 2020-04-24 NOTE — Progress Notes (Signed)
RX metronidazole.  Marylen Ponto, NP  9:18 PM 04/24/2020

## 2020-08-05 ENCOUNTER — Encounter (HOSPITAL_COMMUNITY): Payer: Self-pay | Admitting: Emergency Medicine

## 2020-08-05 ENCOUNTER — Other Ambulatory Visit: Payer: Self-pay

## 2020-08-05 ENCOUNTER — Ambulatory Visit (HOSPITAL_COMMUNITY)
Admission: EM | Admit: 2020-08-05 | Discharge: 2020-08-05 | Disposition: A | Discharge status: Home / Self Care | Payer: Medicaid Other | Attending: Emergency Medicine | Admitting: Emergency Medicine

## 2020-08-05 DIAGNOSIS — S39012A Strain of muscle, fascia and tendon of lower back, initial encounter: Secondary | ICD-10-CM | POA: Diagnosis not present

## 2020-08-05 MED ORDER — METHOCARBAMOL 500 MG PO TABS
500.0000 mg | ORAL_TABLET | Freq: Two times a day (BID) | ORAL | 0 refills | Status: DC
Start: 2020-08-05 — End: 2022-03-05
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
## 2020-08-25 DIAGNOSIS — Z03818 Encounter for observation for suspected exposure to other biological agents ruled out: Secondary | ICD-10-CM | POA: Diagnosis not present

## 2021-03-22 ENCOUNTER — Encounter (HOSPITAL_COMMUNITY): Payer: Self-pay

## 2021-03-22 ENCOUNTER — Ambulatory Visit (HOSPITAL_COMMUNITY)
Admission: EM | Admit: 2021-03-22 | Discharge: 2021-03-22 | Disposition: A | Discharge status: Home / Self Care | Payer: Medicaid Other | Attending: Medical Oncology | Admitting: Medical Oncology

## 2021-03-22 ENCOUNTER — Other Ambulatory Visit: Payer: Self-pay

## 2021-03-22 DIAGNOSIS — N898 Other specified noninflammatory disorders of vagina: Secondary | ICD-10-CM | POA: Diagnosis not present

## 2021-03-22 LAB — POCT URINALYSIS DIPSTICK, ED / UC
Bilirubin Urine: NEGATIVE
Glucose, UA: NEGATIVE mg/dL
Ketones, ur: NEGATIVE mg/dL
Leukocytes,Ua: NEGATIVE
Nitrite: NEGATIVE
Protein, ur: NEGATIVE mg/dL
Specific Gravity, Urine: 1.02 (ref 1.005–1.030)
Urobilinogen, UA: 0.2 mg/dL (ref 0.0–1.0)
pH: 5.5 (ref 5.0–8.0)

## 2021-03-22 LAB — POC URINE PREG, ED: Preg Test, Ur: NEGATIVE

## 2021-03-22 MED ORDER — FLUCONAZOLE 150 MG PO TABS
150.0000 mg | ORAL_TABLET | Freq: Every day | ORAL | 0 refills | Status: DC
Start: 2021-03-22 — End: 2021-10-02

## 2021-03-22 MED ORDER — METRONIDAZOLE 0.75 % VA GEL: 1.0000 | Freq: Every day | VAGINAL | 0 refills | Status: AC

## 2021-03-22 NOTE — ED Triage Notes (Signed)
Pt presents POV with c/o burningin vaginal area and vaginal discharge X 1 week.   States she has noticed cloudy urine.

## 2021-03-22 NOTE — ED Provider Notes (Signed)
[facility]    CSN: [id number] Arrival date & time: 03/22/21  0923      History   Chief Complaint Chief Complaint  Patient presents with   Vaginal Discharge   vaginal irritation    HPI Traci Castro is a 25 y.o. female.   HPI  Vaginal Discharge: Pt reports that over the past 20 days she has had vaginal discharge and itching since having sexual intercourse- no known exposures. Similar to when she has had concurrent BV and vaginal candidiasis infections in the past. She has not tried to treat symptoms with anything yet. No fevers, vomiting, pelvic pain.   Past Medical History:  Diagnosis Date   Anemia    Anxiety    no meds   Bipolar disorder ([place])    Blood transfusion without reported diagnosis    for pp anemia not PPH   Depression    no meds   Encounter for induction of labor 07/15/2019   Gastritis    2 bouts   H/O seasonal allergies    Migraine    migraines   Psoriasis    SVD (spontaneous vaginal delivery)    x 1   Vision abnormalities    wears glasses    Patient Active Problem List   Diagnosis Date Noted   [name] 04/08/2019   Hypotension, unspecified 07/16/2018   Obesity during pregnancy, antepartum 07/16/2018   [name], antepartum 01/31/2018   [name] 01/31/2018   BMI 45.0-49.9, adult ([place]) 03/15/2016    Past Surgical History:  Procedure Laterality Date   DILATION AND CURETTAGE OF UTERUS     DILATION AND EVACUATION N/A 09/20/2017   Procedure: SUCTION DILATATION AND EVACUATION;  Surgeon: Aletha Halim, MD;  Location: [facility];  Service: Gynecology;  Laterality: N/A;   ESOPHAGOGASTRODUODENOSCOPY N/A 10/17/2012   Procedure: ESOPHAGOGASTRODUODENOSCOPY (EGD);  Surgeon: Oletha Blend, MD;  Location: [facility];  Service: Gastroenterology;  Laterality: N/A;   THERAPEUTIC ABORTION  09/16/2017   Clinic   WISDOM TOOTH EXTRACTION      OB History     Gravida  4    Para  3   Term  2   Preterm  1   AB  1   Living  3      SAB      IAB  1   Ectopic      Multiple  0   Live Births  3            Home Medications    Prior to Admission medications   Medication Sig Start Date End Date Taking? Authorizing Provider  Blood Pressure KIT Monitor blood pressure at home regularly extra large O09.90 Patient not taking: No sig reported 03/19/19   Chancy Milroy, MD  coconut oil OIL Apply 1 application topically as needed. Patient not taking: No sig reported 07/17/19   Marcille Buffy D, CNM  cyclobenzaprine (FLEXERIL) 10 MG tablet Take 1 tablet (10 mg total) by mouth every 8 (eight) hours as needed for muscle spasms. Patient not taking: No sig reported 08/11/19   Lajean Manes, CNM  docusate sodium (COLACE) 100 MG capsule Take 1 capsule (100 mg total) by mouth 2 (two) times daily as needed for mild constipation or moderate constipation. Patient not taking: No sig reported 07/25/18   Anyanwu, Sallyanne Havers, MD  doxycycline (VIBRAMYCIN) 100 MG capsule Take 1 capsule (100 mg total) by mouth 2 (two) times daily. 01/05/20  Gavin Pound, CNM  famotidine (PEPCID) 20 MG tablet Take 1 tablet (20 mg total) by mouth 2 (two) times daily. Patient not taking: No sig reported 04/30/19   Lajean Manes, CNM  ferrous sulfate 325 (65 FE) MG tablet TAKE 1 TABLET BY MOUTH TWICE A DAY Patient not taking: No sig reported 10/31/18   Anyanwu, Sallyanne Havers, MD  ibuprofen (ADVIL) 800 MG tablet Take 1 tablet (800 mg total) by mouth every 8 (eight) hours as needed. 01/04/20   Gavin Pound, CNM  methocarbamol (ROBAXIN) 500 MG tablet Take 1 tablet (500 mg total) by mouth 2 (two) times daily. 08/05/20   Marney Setting, NP  Prenatal Vit-Fe Phos-FA-Omega (VITAFOL GUMMIES) 3.33-0.333-34.8 MG CHEW Chew 1 tablet by mouth daily. Patient not taking: No sig reported 03/19/19   Chancy Milroy, MD  sertraline (ZOLOFT) 100 MG tablet Take 1 tablet (100 mg total) by mouth  daily. Patient not taking: No sig reported 08/11/19   Lajean Manes, CNM  tranexamic acid (LYSTEDA) 650 MG TABS tablet Take 2 tablets (1,300 mg total) by mouth 3 (three) times daily. 01/04/20   Gavin Pound, CNM    Family History Family History  Problem Relation Age of Onset   Cholelithiasis Mother    Hyperlipidemia Mother    Hypertension Mother    Mental illness Mother    Thyroid disease Mother    Cholelithiasis Maternal Uncle    Hyperlipidemia Maternal Uncle    Hypertension Maternal Uncle    Cholelithiasis Maternal Grandmother    Arthritis Maternal Grandmother    COPD Maternal Grandmother    Diabetes Maternal Grandmother    Heart disease Maternal Grandmother    Hyperlipidemia Maternal Grandmother    Hypertension Maternal Grandmother    Stroke Maternal Grandmother    Learning disabilities Paternal Grandmother    Mental illness Paternal Grandmother    Cancer Paternal Grandmother    Hyperlipidemia Paternal Grandfather    Diabetes Paternal Grandfather    Depression Father    Hypertension Father    Ulcers Neg Hx    Alcohol abuse Neg Hx    Asthma Neg Hx    Birth defects Neg Hx    Drug abuse Neg Hx    Early death Neg Hx    Hearing loss Neg Hx    Kidney disease Neg Hx    Mental retardation Neg Hx    Miscarriages / Stillbirths Neg Hx    Vision loss Neg Hx     Social History Social History   Tobacco Use   Smoking status: Never   Smokeless tobacco: Never  Vaping Use   Vaping Use: Never used  Substance Use Topics   Alcohol use: No   Drug use: No     Allergies   Chlorhexidine and Tramadol   Review of Systems Review of Systems  As stated above in HPI Physical Exam Triage Vital Signs ED Triage Vitals [03/22/21 0904]  Enc Vitals Group     BP 138/90     Pulse Rate 70     Resp 19     Temp 98 F (36.7 C)     Temp Source Oral     SpO2 98 %     Weight      Height      Head Circumference      Peak Flow      Pain Score 0     Pain Loc      Pain Edu?       Excl. in  [place]?    No data found.  Updated Vital Signs BP 138/90 (BP Location: Right Arm)   Pulse 70   Temp 98 F (36.7 C) (Oral)   Resp 19   LMP 03/05/2021 (Exact Date)   SpO2 98%   Physical Exam Vitals and nursing note reviewed.  Constitutional:      General: She is not in acute distress.    Appearance: Normal appearance. She is not ill-appearing, toxic-appearing or diaphoretic.  Genitourinary:    Comments: Pt obtains self swab collection before visit Neurological:     Mental Status: She is alert.     UC Treatments / Results  Labs (all labs ordered are listed, but only abnormal results are displayed) Labs Reviewed  POCT URINALYSIS DIPSTICK, ED / UC - Abnormal; Notable for the following components:      Result Value   Hgb urine dipstick TRACE (*)    All other components within normal limits  POC URINE PREG, ED    EKG   Radiology No results found.  Procedures Procedures (including critical care time)  Medications Ordered in UC Medications - No data to display  Initial Impression / Assessment and Plan / UC Course  I have reviewed the triage vital signs and the nursing notes.  Pertinent labs & imaging results that were available during my care of the patient were reviewed by me and considered in my medical decision making (see chart for details).     New.  We are going to treat her for both today.  Treating with Flagyl and Diflucan.  Discussed with patient.  We discussed that her urine did not show a sign of a urinary tract infection however we will culture to ensure.  Follow-up as needed.  Final Clinical Impressions(s) / UC Diagnoses   Final diagnoses:  None   Discharge Instructions   None    ED Prescriptions   None    PDMP not reviewed this encounter.   Hughie Closs, [place] 03/22/21 [phone]

## 2021-03-24 LAB — CERVICOVAGINAL ANCILLARY ONLY
Bacterial Vaginitis (gardnerella): POSITIVE — AB
Candida Glabrata: NEGATIVE
Candida Vaginitis: POSITIVE — AB
Chlamydia: NEGATIVE
Comment: NEGATIVE
Comment: NEGATIVE
Comment: NEGATIVE
Comment: NEGATIVE
Comment: NEGATIVE
Comment: NORMAL
Neisseria Gonorrhea: NEGATIVE
Trichomonas: NEGATIVE

## 2021-10-02 ENCOUNTER — Other Ambulatory Visit: Payer: Self-pay

## 2021-10-02 ENCOUNTER — Ambulatory Visit (HOSPITAL_COMMUNITY)
Admission: EM | Admit: 2021-10-02 | Discharge: 2021-10-02 | Disposition: A | Discharge status: Home / Self Care | Payer: Medicaid Other | Attending: Physician Assistant | Admitting: Physician Assistant

## 2021-10-02 ENCOUNTER — Encounter (HOSPITAL_COMMUNITY): Payer: Self-pay

## 2021-10-02 DIAGNOSIS — N390 Urinary tract infection, site not specified: Secondary | ICD-10-CM

## 2021-10-02 DIAGNOSIS — N898 Other specified noninflammatory disorders of vagina: Secondary | ICD-10-CM | POA: Diagnosis not present

## 2021-10-02 DIAGNOSIS — R300 Dysuria: Secondary | ICD-10-CM | POA: Diagnosis present

## 2021-10-02 LAB — POCT URINALYSIS DIPSTICK, ED / UC
Bilirubin Urine: NEGATIVE
Glucose, UA: NEGATIVE mg/dL
Ketones, ur: NEGATIVE mg/dL
Leukocytes,Ua: NEGATIVE
Nitrite: NEGATIVE
Protein, ur: NEGATIVE mg/dL
Specific Gravity, Urine: >=1.03 (ref 1.005–1.030)
Urobilinogen, UA: 0.2 mg/dL (ref 0.0–1.0)
pH: 5.5 (ref 5.0–8.0)

## 2021-10-02 MED ORDER — NITROFURANTOIN MONOHYD MACRO 100 MG PO CAPS: 100.0000 mg | ORAL_CAPSULE | Freq: Two times a day (BID) | ORAL | 0 refills | Status: DC

## 2021-10-02 NOTE — Discharge Instructions (Signed)
Your urine did not look like it is infected but we are going to start an antibiotic since you have had symptoms for over a week.  Start Macrobid twice daily.  I will send this off for culture and if it turns out you do not have a urinary tract infection we will contact you and have you stop the medicine.  If need to change antibiotics we will call you for that as well.  We will contact you with your swab results if we need to arrange any treatment.  Follow-up with OB/GYN for further evaluation management.  If you develop any severe symptoms including high fever, abdominal pain, blood in your urine, pelvic pain, abdominal pain you should be seen immediately.

## 2021-10-02 NOTE — ED Triage Notes (Signed)
Pt reports painful urination x 1 week.

## 2021-10-02 NOTE — ED Provider Notes (Signed)
[facility]    CSN: [id number] Arrival date & time: 10/02/21  1415      History   Chief Complaint Chief Complaint  Patient presents with   Dysuria    HPI Traci Castro is a 26 y.o. female.   Patient presents today with 1 week history of UTI symptoms.  She reports frequency, urgency, dysuria, malodorous urine.  Denies any abdominal pain, pelvic pain, back pain, fever, nausea, vomiting.  She does report some vaginal discharge but states has been intermittent since she had Nexplanon placed previously.  She is interested in seeing an OB/GYN to consider additional birth control methods.  She is competent she is not pregnant at since she has a Nexplanon.  Denies any recent antibiotic use.  She has not tried any over-the-counter medication for symptom management.  Denies any history of nephrolithiasis, recurrent UTI, seeing a urologist, single kidney, recent urogenital procedure, self-catheterization.   Past Medical History:  Diagnosis Date   Anemia    Anxiety    no meds   Bipolar disorder ([place])    Blood transfusion without reported diagnosis    for pp anemia not PPH   Depression    no meds   Encounter for induction of labor 07/15/2019   Gastritis    2 bouts   H/O seasonal allergies    Migraine    migraines   Psoriasis    SVD (spontaneous vaginal delivery)    x 1   Vision abnormalities    wears glasses    Patient Active Problem List   Diagnosis Date Noted   [name] 04/08/2019   Hypotension, unspecified 07/16/2018   Obesity during pregnancy, antepartum 07/16/2018   [name], antepartum 01/31/2018   [name] 01/31/2018   BMI 45.0-49.9, adult ([place]) 03/15/2016    Past Surgical History:  Procedure Laterality Date   DILATION AND CURETTAGE OF UTERUS     DILATION AND EVACUATION N/A 09/20/2017   Procedure: SUCTION DILATATION AND EVACUATION;  Surgeon: Aletha Halim, MD;  Location: [facility];  Service: Gynecology;  Laterality: N/A;   ESOPHAGOGASTRODUODENOSCOPY N/A 10/17/2012   Procedure: ESOPHAGOGASTRODUODENOSCOPY (EGD);  Surgeon: Oletha Blend, MD;  Location: [facility];  Service: Gastroenterology;  Laterality: N/A;   THERAPEUTIC ABORTION  09/16/2017   Clinic   WISDOM TOOTH EXTRACTION      OB History     Gravida  4   Para  3   Term  2   Preterm  1   AB  1   Living  3      SAB      IAB  1   Ectopic      Multiple  0   Live Births  3            Home Medications    Prior to Admission medications   Medication Sig Start Date End Date Taking? Authorizing Provider  nitrofurantoin, macrocrystal-monohydrate, (MACROBID) 100 MG capsule Take 1 capsule (100 mg total) by mouth 2 (two) times daily. 10/02/21  Yes Sirinity Outland, Derry Skill, PA-C  Blood Pressure KIT Monitor blood pressure at home regularly extra large O09.90 Patient not taking: No sig reported 03/19/19   Chancy Milroy, MD  coconut oil OIL Apply 1 application topically as needed. Patient not taking: No sig reported 07/17/19   Marcille Buffy D, CNM  cyclobenzaprine (FLEXERIL) 10 MG tablet Take 1 tablet (10 mg total) by mouth every 8 (eight) hours as needed for  muscle spasms. Patient not taking: No sig reported 08/11/19   Lajean Manes, CNM  docusate sodium (COLACE) 100 MG capsule Take 1 capsule (100 mg total) by mouth 2 (two) times daily as needed for mild constipation or moderate constipation. Patient not taking: No sig reported 07/25/18   Anyanwu, Sallyanne Havers, MD  famotidine (PEPCID) 20 MG tablet Take 1 tablet (20 mg total) by mouth 2 (two) times daily. Patient not taking: No sig reported 04/30/19   Lajean Manes, CNM  ferrous sulfate 325 (65 FE) MG tablet TAKE 1 TABLET BY MOUTH TWICE A DAY Patient not taking: No sig reported 10/31/18   Anyanwu, Sallyanne Havers, MD  ibuprofen (ADVIL) 800 MG tablet Take 1 tablet (800 mg total) by mouth every 8 (eight) hours as needed. 01/04/20   Gavin Pound, CNM  methocarbamol  (ROBAXIN) 500 MG tablet Take 1 tablet (500 mg total) by mouth 2 (two) times daily. 08/05/20   Marney Setting, NP  Prenatal Vit-Fe Phos-FA-Omega (VITAFOL GUMMIES) 3.33-0.333-34.8 MG CHEW Chew 1 tablet by mouth daily. Patient not taking: No sig reported 03/19/19   Chancy Milroy, MD  sertraline (ZOLOFT) 100 MG tablet Take 1 tablet (100 mg total) by mouth daily. Patient not taking: No sig reported 08/11/19   Lajean Manes, CNM  tranexamic acid (LYSTEDA) 650 MG TABS tablet Take 2 tablets (1,300 mg total) by mouth 3 (three) times daily. 01/04/20   Gavin Pound, CNM    Family History Family History  Problem Relation Age of Onset   Cholelithiasis Mother    Hyperlipidemia Mother    Hypertension Mother    Mental illness Mother    Thyroid disease Mother    Cholelithiasis Maternal Uncle    Hyperlipidemia Maternal Uncle    Hypertension Maternal Uncle    Cholelithiasis Maternal Grandmother    Arthritis Maternal Grandmother    COPD Maternal Grandmother    Diabetes Maternal Grandmother    Heart disease Maternal Grandmother    Hyperlipidemia Maternal Grandmother    Hypertension Maternal Grandmother    Stroke Maternal Grandmother    Learning disabilities Paternal Grandmother    Mental illness Paternal Grandmother    Cancer Paternal Grandmother    Hyperlipidemia Paternal Grandfather    Diabetes Paternal Grandfather    Depression Father    Hypertension Father    Ulcers Neg Hx    Alcohol abuse Neg Hx    Asthma Neg Hx    Birth defects Neg Hx    Drug abuse Neg Hx    Early death Neg Hx    Hearing loss Neg Hx    Kidney disease Neg Hx    Mental retardation Neg Hx    Miscarriages / Stillbirths Neg Hx    Vision loss Neg Hx     Social History Social History   Tobacco Use   Smoking status: Never   Smokeless tobacco: Never  Vaping Use   Vaping Use: Never used  Substance Use Topics   Alcohol use: No   Drug use: No     Allergies   Chlorhexidine and Tramadol   Review of  Systems Review of Systems  Constitutional:  Positive for activity change. Negative for appetite change, fatigue and fever.  Respiratory:  Negative for cough and shortness of breath.   Cardiovascular:  Negative for chest pain.  Gastrointestinal:  Negative for abdominal pain, diarrhea, nausea and vomiting.  Genitourinary:  Positive for dysuria, frequency, urgency and vaginal discharge. Negative for flank pain, hematuria, pelvic pain, vaginal bleeding  and vaginal pain.  Musculoskeletal:  Negative for arthralgias, back pain and myalgias.  Neurological:  Negative for dizziness, light-headedness and headaches.    Physical Exam Triage Vital Signs ED Triage Vitals  Enc Vitals Group     BP 10/02/21 1704 133/86     Pulse Rate 10/02/21 1704 78     Resp 10/02/21 1704 18     Temp 10/02/21 1704 98.1 F (36.7 C)     Temp Source 10/02/21 1704 Oral     SpO2 10/02/21 1704 100 %     Weight --      Height --      Head Circumference --      Peak Flow --      Pain Score 10/02/21 1702 2     Pain Loc --      Pain Edu? --      Excl. in [place]? --    No data found.  Updated Vital Signs BP 133/86 (BP Location: Left Arm)    Pulse 78    Temp 98.1 F (36.7 C) (Oral)    Resp 18    LMP 08/07/2021 (Exact Date)    SpO2 100%   Visual Acuity Right Eye Distance:   Left Eye Distance:   Bilateral Distance:    Right Eye Near:   Left Eye Near:    Bilateral Near:     Physical Exam Vitals reviewed.  Constitutional:      General: She is awake. She is not in acute distress.    Appearance: Normal appearance. She is well-developed. She is not ill-appearing.     Comments: Very pleasant female appears stated age in no acute distress sitting comfortably in exam room  HENT:     Head: Normocephalic and atraumatic.  Cardiovascular:     Rate and Rhythm: Normal rate and regular rhythm.     Heart sounds: Normal heart sounds, S1 normal and S2 normal. No murmur heard. Pulmonary:     Effort: Pulmonary effort is normal.      Breath sounds: Normal breath sounds. No wheezing, rhonchi or rales.     Comments: Clear to auscultation bilaterally Abdominal:     General: Bowel sounds are normal.     Palpations: Abdomen is soft.     Tenderness: There is no abdominal tenderness. There is no right CVA tenderness, left CVA tenderness, guarding or rebound.     Comments: Benign abdominal exam  Genitourinary:    Comments: Exam deferred Psychiatric:        Behavior: Behavior is cooperative.     UC Treatments / Results  Labs (all labs ordered are listed, but only abnormal results are displayed) Labs Reviewed  POCT URINALYSIS DIPSTICK, ED / UC - Abnormal; Notable for the following components:      Result Value   Hgb urine dipstick TRACE (*)    All other components within normal limits  URINE CULTURE  CERVICOVAGINAL ANCILLARY ONLY    EKG   Radiology No results found.  Procedures Procedures (including critical care time)  Medications Ordered in UC Medications - No data to display  Initial Impression / Assessment and Plan / UC Course  I have reviewed the triage vital signs and the nursing notes.  Pertinent labs & imaging results that were available during my care of the patient were reviewed by me and considered in my medical decision making (see chart for details).     UA had trace hemoglobin but was otherwise negative.  Given clinical presentation including 1 week  of UTI symptoms will empirically treat with Macrobid.  Urine culture was obtained we discussed that if this does not grow any bacteria we will stop the antibiotic.  Also discussed potential need to change antibiotics based on susceptibilities identified on culture.  Recommended she rest and drink plenty of fluid.  STI swab was collected-results pending.  Will defer additional treatment until results are obtained.  Patient was given the contact information for OB/GYN for ongoing care including removal of Nexplanon.  We will try to establish care  with a PCP via PCP assistance.  Discussed that if she has any worsening symptoms including fever, back pain, body aches, nausea/vomiting, blood in her urine, pelvic pain she needs to be seen immediately.  Strict return precautions given to which she expressed understanding.  Work excuse note provided.  Final Clinical Impressions(s) / UC Diagnoses   Final diagnoses:  Lower urinary tract infectious disease  Dysuria  Vaginal discharge     Discharge Instructions      Your urine did not look like it is infected but we are going to start an antibiotic since you have had symptoms for over a week.  Start Macrobid twice daily.  I will send this off for culture and if it turns out you do not have a urinary tract infection we will contact you and have you stop the medicine.  If need to change antibiotics we will call you for that as well.  We will contact you with your swab results if we need to arrange any treatment.  Follow-up with OB/GYN for further evaluation management.  If you develop any severe symptoms including high fever, abdominal pain, blood in your urine, pelvic pain, abdominal pain you should be seen immediately.     ED Prescriptions     Medication Sig Dispense Auth. Provider   nitrofurantoin, macrocrystal-monohydrate, (MACROBID) 100 MG capsule Take 1 capsule (100 mg total) by mouth 2 (two) times daily. 10 capsule Fishel Wamble, Derry Skill, PA-C      PDMP not reviewed this encounter.   Terrilee Croak, PA-C 10/02/21 1746

## 2021-10-03 LAB — CERVICOVAGINAL ANCILLARY ONLY
Bacterial Vaginitis (gardnerella): POSITIVE — AB
Candida Glabrata: NEGATIVE
Candida Vaginitis: POSITIVE — AB
Chlamydia: POSITIVE — AB
Comment: NEGATIVE
Comment: NEGATIVE
Comment: NEGATIVE
Comment: NEGATIVE
Comment: NEGATIVE
Comment: NORMAL
Neisseria Gonorrhea: NEGATIVE
Trichomonas: NEGATIVE

## 2021-10-04 ENCOUNTER — Telehealth (HOSPITAL_COMMUNITY): Payer: Self-pay | Admitting: Emergency Medicine

## 2021-10-04 MED ORDER — FLUCONAZOLE 150 MG PO TABS: 150.0000 mg | ORAL_TABLET | Freq: Once | ORAL | 0 refills | Status: AC

## 2021-10-04 MED ORDER — DOXYCYCLINE HYCLATE 100 MG PO CAPS: 100.0000 mg | ORAL_CAPSULE | Freq: Two times a day (BID) | ORAL | 0 refills | Status: AC

## 2021-10-04 MED ORDER — METRONIDAZOLE 500 MG PO TABS: 500.0000 mg | ORAL_TABLET | Freq: Two times a day (BID) | ORAL | 0 refills | Status: DC

## 2021-10-05 LAB — URINE CULTURE: Culture: >=100000 — AB

## 2021-10-23 ENCOUNTER — Encounter (HOSPITAL_COMMUNITY): Payer: Self-pay | Admitting: Emergency Medicine

## 2021-10-23 ENCOUNTER — Ambulatory Visit (HOSPITAL_COMMUNITY)
Admission: EM | Admit: 2021-10-23 | Discharge: 2021-10-23 | Disposition: A | Discharge status: Home / Self Care | Payer: Medicaid Other | Attending: Family Medicine | Admitting: Family Medicine

## 2021-10-23 ENCOUNTER — Other Ambulatory Visit: Payer: Self-pay

## 2021-10-23 DIAGNOSIS — A749 Chlamydial infection, unspecified: Secondary | ICD-10-CM

## 2021-10-23 DIAGNOSIS — Z202 Contact with and (suspected) exposure to infections with a predominantly sexual mode of transmission: Secondary | ICD-10-CM

## 2021-10-23 DIAGNOSIS — Z760 Encounter for issue of repeat prescription: Secondary | ICD-10-CM | POA: Diagnosis not present

## 2021-10-23 LAB — HIV ANTIBODY (ROUTINE TESTING W REFLEX): HIV Screen 4th Generation wRfx: NONREACTIVE

## 2021-10-23 LAB — RPR: RPR Ser Ql: NONREACTIVE

## 2021-10-23 MED ORDER — DOXYCYCLINE HYCLATE 100 MG PO CAPS: 100.0000 mg | ORAL_CAPSULE | Freq: Two times a day (BID) | ORAL | 0 refills | Status: DC

## 2021-10-23 MED ORDER — IBUPROFEN 800 MG PO TABS: 800.0000 mg | ORAL_TABLET | Freq: Three times a day (TID) | ORAL | 0 refills | Status: DC | PRN

## 2021-10-23 NOTE — Discharge Instructions (Signed)
You were seen today for medication refill after exposure to STD.  I have refilled the doxycycline for you today, as well at the motrin 800mg  tablets.  The vaginal swab and lab work will be resulted tomorrow via .  If anything is otherwise positive we will call you with results.
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
## 2021-10-24 ENCOUNTER — Telehealth (HOSPITAL_COMMUNITY): Payer: Self-pay | Admitting: Emergency Medicine

## 2021-10-24 LAB — CERVICOVAGINAL ANCILLARY ONLY
Bacterial Vaginitis (gardnerella): POSITIVE — AB
Candida Glabrata: NEGATIVE
Candida Vaginitis: NEGATIVE
Chlamydia: NEGATIVE
Comment: NEGATIVE
Comment: NEGATIVE
Comment: NEGATIVE
Comment: NEGATIVE
Comment: NEGATIVE
Comment: NORMAL
Neisseria Gonorrhea: NEGATIVE
Trichomonas: NEGATIVE

## 2021-10-24 MED ORDER — METRONIDAZOLE 0.75 % VA GEL: 1.0000 | Freq: Every day | VAGINAL | 0 refills | Status: AC

## 2021-12-19 ENCOUNTER — Ambulatory Visit: Payer: Medicaid Other | Admitting: Advanced Practice Midwife

## 2021-12-19 NOTE — Progress Notes (Deleted)
   Subjective:     Traci Castro is a 26 y.o. female here at [facility] *** for a routine exam.  Current complaints: ***.  Personal health questionnaire reviewed: {yes/no:9010}.  Do you have a primary care provider? *** Do you feel safe at home? ***  Flowsheet Row Video Visit from 04/14/2019 in [facility]  PHQ-2 Total Score 2       Health Maintenance Due  Topic Date Due   COVID-19 Vaccine (1) Never done   HPV VACCINES (1 - 2-dose series) Never done   Hepatitis C Screening  Never done   PAP-Cervical Cytology Screening  01/28/2021   PAP SMEAR-Modifier  01/28/2021     Risk factors for chronic health problems: Smoking: Alchohol/how much: Pt BMI: There is no height or weight on file to calculate BMI.   Gynecologic History No LMP recorded. Patient has had an implant. Contraception: {method:5051} Last Pap: 01/28/2018. Results were: {norm/abn:16337} Last mammogram: n/a.   Obstetric History OB History  Gravida Para Term Preterm AB Living  4 3 2 1 1 3   SAB IAB Ectopic Multiple Live Births    1   0 3    # Outcome Date GA Lbr Len/2nd Weight Sex Delivery Anes PTL Lv  4 Term 07/15/19 [redacted]w[redacted]d 04:38 / 00:01 7 lb 7.2 oz (3.379 kg) M Vag-Spont None  LIV     Birth Comments: wnl  3 Preterm 07/23/18 [redacted]w[redacted]d 01:45 / 00:02 6 lb 8.1 oz (2.95 kg) M Vag-Spont None  LIV     Birth Comments: wnl  2 IAB 2019 [redacted]w[redacted]d         1 Term 05/05/16 [redacted]w[redacted]d 03:05 / 00:50 7 lb 6.2 oz (3.351 kg) M Vag-Spont Local  LIV     {Common ambulatory SmartLinks:19316}  Review of Systems {ros; complete:30496}    Objective:   There were no vitals taken for this visit. VS reviewed, nursing note reviewed,  Constitutional: well developed, well nourished, no distress HEENT: normocephalic CV: normal rate Pulm/chest wall: normal effort Breast Exam:  ***Deferred with low risks and shared decision making, discussed recommendation to start mammogram between 40-50 yo/ exam performed: right breast normal  without mass, skin or nipple changes or axillary nodes, left breast normal without mass, skin or nipple changes or axillary nodes Abdomen: soft Neuro: alert and oriented x 3 Skin: warm, dry Psych: affect normal Pelvic exam: ***Deferred/ Performed: Cervix pink, visually closed, without lesion, scant white creamy discharge, vaginal walls and external genitalia normal Bimanual exam: Cervix 0/long/high, firm, anterior, neg CMT, uterus nontender, nonenlarged, adnexa without tenderness, enlargement, or mass       Assessment/Plan:   There are no diagnoses linked to this encounter.     No follow-ups on file.   [redacted]w[redacted]d, CNM 8:33 AM

## 2022-03-05 ENCOUNTER — Encounter (HOSPITAL_COMMUNITY): Payer: Self-pay

## 2022-03-05 ENCOUNTER — Ambulatory Visit (HOSPITAL_COMMUNITY)
Admission: RE | Admit: 2022-03-05 | Discharge: 2022-03-05 | Disposition: A | Discharge status: Home / Self Care | Payer: Medicaid Other | Source: Ambulatory Visit | Attending: Urgent Care | Admitting: Urgent Care

## 2022-03-05 VITALS — BP 116/74 | HR 73 | Temp 98.3°F | Resp 14

## 2022-03-05 DIAGNOSIS — R600 Localized edema: Secondary | ICD-10-CM | POA: Diagnosis not present

## 2022-03-05 DIAGNOSIS — R233 Spontaneous ecchymoses: Secondary | ICD-10-CM | POA: Diagnosis present

## 2022-03-05 LAB — CBC WITH DIFFERENTIAL/PLATELET
Abs Immature Granulocytes: 0.02 10*3/uL (ref 0.00–0.07)
Basophils Absolute: 0.1 10*3/uL (ref 0.0–0.1)
Basophils Relative: 1 %
Eosinophils Absolute: 0.3 10*3/uL (ref 0.0–0.5)
Eosinophils Relative: 3 %
HCT: 40.3 % (ref 36.0–46.0)
Hemoglobin: 12.4 g/dL (ref 12.0–15.0)
Immature Granulocytes: 0 %
Lymphocytes Relative: 39 %
Lymphs Abs: 3.9 10*3/uL (ref 0.7–4.0)
MCH: 24.4 pg — ABNORMAL LOW (ref 26.0–34.0)
MCHC: 30.8 g/dL (ref 30.0–36.0)
MCV: 79.2 fL — ABNORMAL LOW (ref 80.0–100.0)
Monocytes Absolute: 0.6 10*3/uL (ref 0.1–1.0)
Monocytes Relative: 6 %
Neutro Abs: 5.2 10*3/uL (ref 1.7–7.7)
Neutrophils Relative %: 51 %
Platelets: 337 10*3/uL (ref 150–400)
RBC: 5.09 MIL/uL (ref 3.87–5.11)
RDW: 15.5 % (ref 11.5–15.5)
WBC: 10 10*3/uL (ref 4.0–10.5)
nRBC: 0 % (ref 0.0–0.2)

## 2022-03-05 LAB — COMPREHENSIVE METABOLIC PANEL
ALT: 14 U/L (ref 0–44)
AST: 18 U/L (ref 15–41)
Albumin: 3.3 g/dL — ABNORMAL LOW (ref 3.5–5.0)
Alkaline Phosphatase: 61 U/L (ref 38–126)
Anion gap: 6 (ref 5–15)
BUN: 11 mg/dL (ref 6–20)
CO2: 26 mmol/L (ref 22–32)
Calcium: 8.7 mg/dL — ABNORMAL LOW (ref 8.9–10.3)
Chloride: 105 mmol/L (ref 98–111)
Creatinine, Ser: 0.89 mg/dL (ref 0.44–1.00)
GFR, Estimated: >60 mL/min (ref >60)
Glucose, Bld: 98 mg/dL (ref 70–99)
Potassium: 5.1 mmol/L (ref 3.5–5.1)
Sodium: 137 mmol/L (ref 135–145)
Total Bilirubin: 0.2 mg/dL — ABNORMAL LOW (ref 0.3–1.2)
Total Protein: 6.5 g/dL (ref 6.5–8.1)

## 2022-03-05 NOTE — ED Triage Notes (Signed)
Pt presents with c/o leg bruising and swelling that was discovered when trying to donate plasma on 02/26/2022

## 2022-03-05 NOTE — Discharge Instructions (Addendum)
Your edema is likely dependent edema, which is due to gravity redistributing your normal body fluid. Excess weight can also cause this. Moderate weight loss, compression stockings, increased physical activity, and possibly even coconut water can help decrease this. You had lab work drawn today to assess for causes of your easy bruising, please follow-up with the PCP for a more comprehensive evaluation should your test be normal. Results should be available in MyChart within 24 to 48 hours.

## 2022-03-05 NOTE — ED Provider Notes (Signed)
[facility]    CSN: [id number] Arrival date & time: 03/05/22  1510      History   Chief Complaint Chief Complaint  Patient presents with  . Leg Swelling    Entered by patient    HPI Carrie Usery is a 26 y.o. female.   Pleasant 26 year old female presents today primarily per the request of a plasma donation center for evaluation.  She went to go give her first donation of plasma and was denied, as the examiner told her she had too much fluid in her legs, and too much bruising in her legs.  Patient denies any trauma or injury.  She personally did not notice the bruising until it was brought to her attention.  She denies bruising elsewhere.  She does endorse heavy menstrual bleeding.  She has a non-contributory past medical history.  Last evaluation was after the birth of her 46-year-old child, patient has 3 children at home.  She does not take any prescription medication.  She denies chest pain, shortness of breath.  She denies any complaints, and wants clearance to donate plasma.    Past Medical History:  Diagnosis Date  . Anemia   . Anxiety    no meds  . Bipolar disorder ([facility])   . Blood transfusion without reported diagnosis    for pp anemia not PPH  . Depression    no meds  . Encounter for induction of labor 07/15/2019  . Gastritis    2 bouts  . H/O seasonal allergies   . Migraine    migraines  . Psoriasis   . SVD (spontaneous vaginal delivery)    x 1  . Vision abnormalities    wears glasses    Patient Active Problem List   Diagnosis Date Noted  . [name] 04/08/2019  . Hypotension, unspecified 07/16/2018  . Obesity during pregnancy, antepartum 07/16/2018  . [name], antepartum 01/31/2018  . [name] 01/31/2018  . BMI 45.0-49.9, adult ([facility]) 03/15/2016    Past Surgical History:  Procedure Laterality Date  . DILATION AND CURETTAGE OF UTERUS    . DILATION AND EVACUATION  N/A 09/20/2017   Procedure: SUCTION DILATATION AND EVACUATION;  Surgeon: [place] Bing, MD;  Location: [facility];  Service: Gynecology;  Laterality: N/A;  . ESOPHAGOGASTRODUODENOSCOPY N/A 10/17/2012   Procedure: ESOPHAGOGASTRODUODENOSCOPY (EGD);  Surgeon: Jon Gills, MD;  Location: [facility];  Service: Gastroenterology;  Laterality: N/A;  . THERAPEUTIC ABORTION  09/16/2017   Clinic  . WISDOM TOOTH EXTRACTION      OB History     Gravida  4   Para  3   Term  2   Preterm  1   AB  1   Living  3      SAB      IAB  1   Ectopic      Multiple  0   Live Births  3            Home Medications    Prior to Admission medications   Medication Sig Start Date End Date Taking? Authorizing Provider  doxycycline (VIBRAMYCIN) 100 MG capsule Take 1 capsule (100 mg total) by mouth 2 (two) times daily. Patient not taking: Reported on 03/05/2022 10/23/21   Jannifer Franklin, MD  ibuprofen (ADVIL) 800 MG tablet Take 1 tablet (800 mg total) by mouth every 8 (eight) hours as needed. Patient not taking: Reported on 03/05/2022 10/23/21   Jannifer Franklin, MD  methocarbamol (ROBAXIN) 500 MG tablet Take 1 tablet (500 mg total) by mouth 2 (two) times daily. Patient not taking: Reported on 03/05/2022 08/05/20   Coralyn Mark, NP  nitrofurantoin, macrocrystal-monohydrate, (MACROBID) 100 MG capsule Take 1 capsule (100 mg total) by mouth 2 (two) times daily. Patient not taking: Reported on 03/05/2022 10/02/21   Raspet, Denny Peon K, PA-C  tranexamic acid (LYSTEDA) 650 MG TABS tablet Take 2 tablets (1,300 mg total) by mouth 3 (three) times daily. Patient not taking: Reported on 03/05/2022 01/04/20   Gerrit Heck, CNM    Family History Family History  Problem Relation Age of Onset  . Cholelithiasis Mother   . Hyperlipidemia Mother   . Hypertension Mother   . Mental illness Mother   . Thyroid disease Mother   . Cholelithiasis Maternal Uncle   . Hyperlipidemia Maternal Uncle   . Hypertension Maternal Uncle    . Cholelithiasis Maternal Grandmother   . Arthritis Maternal Grandmother   . COPD Maternal Grandmother   . Diabetes Maternal Grandmother   . Heart disease Maternal Grandmother   . Hyperlipidemia Maternal Grandmother   . Hypertension Maternal Grandmother   . Stroke Maternal Grandmother   . Learning disabilities Paternal Grandmother   . Mental illness Paternal Grandmother   . Cancer Paternal Grandmother   . Hyperlipidemia Paternal Grandfather   . Diabetes Paternal Grandfather   . Depression Father   . Hypertension Father   . Ulcers Neg Hx   . Alcohol abuse Neg Hx   . Asthma Neg Hx   . Birth defects Neg Hx   . Drug abuse Neg Hx   . Early death Neg Hx   . Hearing loss Neg Hx   . Kidney disease Neg Hx   . Mental retardation Neg Hx   . Miscarriages / Stillbirths Neg Hx   . Vision loss Neg Hx     Social History Social History   Tobacco Use  . Smoking status: Never  . Smokeless tobacco: Never  Vaping Use  . Vaping Use: Never used  Substance Use Topics  . Alcohol use: No  . Drug use: No     Allergies   Chlorhexidine and Tramadol   Review of Systems Review of Systems   Physical Exam Triage Vital Signs ED Triage Vitals  Enc Vitals Group     BP 03/05/22 1626 116/74     Pulse Rate 03/05/22 1626 73     Resp 03/05/22 1626 14     Temp 03/05/22 1626 98.3 F (36.8 C)     Temp Source 03/05/22 1626 Oral     SpO2 03/05/22 1626 100 %     Weight --      Height --      Head Circumference --      Peak Flow --      Pain Score 03/05/22 1625 0     Pain Loc --      Pain Edu? --      Excl. in [place]? --    No data found.  Updated Vital Signs BP 116/74 (BP Location: Left Arm)   Pulse 73   Temp 98.3 F (36.8 C) (Oral)   Resp 14   SpO2 100%   Visual Acuity Right Eye Distance:   Left Eye Distance:   Bilateral Distance:    Right Eye Near:   Left Eye Near:    Bilateral Near:     Physical Exam   UC Treatments / Results  Labs (all labs ordered are listed,  but  only abnormal results are displayed) Labs Reviewed - No data to display  EKG   Radiology No results found.  Procedures Procedures (including critical care time)  Medications Ordered in UC Medications - No data to display  Initial Impression / Assessment and Plan / UC Course  I have reviewed the triage vital signs and the nursing notes.  Pertinent labs & imaging results that were available during my care of the patient were reviewed by me and considered in my medical decision making (see chart for details).     *** Final Clinical Impressions(s) / UC Diagnoses   Final diagnoses:  None   Discharge Instructions   None    ED Prescriptions   None    PDMP not reviewed this encounter.

## 2022-03-18 ENCOUNTER — Ambulatory Visit (HOSPITAL_COMMUNITY): Payer: Self-pay

## 2022-03-19 ENCOUNTER — Encounter (HOSPITAL_COMMUNITY): Payer: Self-pay

## 2022-03-19 ENCOUNTER — Ambulatory Visit (HOSPITAL_COMMUNITY)
Admission: EM | Admit: 2022-03-19 | Discharge: 2022-03-19 | Disposition: A | Discharge status: Home / Self Care | Payer: Medicaid Other | Attending: Family Medicine | Admitting: Family Medicine

## 2022-03-19 DIAGNOSIS — R21 Rash and other nonspecific skin eruption: Secondary | ICD-10-CM | POA: Diagnosis not present

## 2022-03-19 DIAGNOSIS — Z202 Contact with and (suspected) exposure to infections with a predominantly sexual mode of transmission: Secondary | ICD-10-CM | POA: Diagnosis present

## 2022-03-19 DIAGNOSIS — N898 Other specified noninflammatory disorders of vagina: Secondary | ICD-10-CM | POA: Diagnosis not present

## 2022-03-19 LAB — HIV ANTIBODY (ROUTINE TESTING W REFLEX): HIV Screen 4th Generation wRfx: NONREACTIVE

## 2022-03-19 MED ORDER — DOXYCYCLINE HYCLATE 100 MG PO CAPS: 100.0000 mg | ORAL_CAPSULE | Freq: Two times a day (BID) | ORAL | 0 refills | Status: AC

## 2022-03-19 MED ORDER — CEFTRIAXONE SODIUM 500 MG IJ SOLR
INTRAMUSCULAR | Status: AC
  Filled 2022-03-19: qty 500

## 2022-03-19 MED ORDER — CEFTRIAXONE SODIUM 500 MG IJ SOLR
500.0000 mg | Freq: Once | INTRAMUSCULAR | Status: AC
  Administered 2022-03-19: 500 mg via INTRAMUSCULAR

## 2022-03-19 NOTE — ED Provider Notes (Signed)
[facility]    CSN: [id number] Arrival date & time: 03/19/22  1120      History   Chief Complaint Chief Complaint  Patient presents with   Exposure to STD    HPI Danah Reinecke is a 26 y.o. female.    Exposure to STD   Here for vaginal discharge and exposure to STD.  Her boyfriend let her know that he had tested positive for an STD, but he did not let her know which 1.  She has had some vaginal discharge lately.  Also she feels some rash and itching on her perineum.  No fever or abdominal pain.  She has started her menstrual cycle yesterday  Does have psoriasis, and is currently not on any medication for it  Past Medical History:  Diagnosis Date   Anemia    Anxiety    no meds   Bipolar disorder (HCC)    Blood transfusion without reported diagnosis    for pp anemia not PPH   Depression    no meds   Encounter for induction of labor 07/15/2019   Gastritis    2 bouts   H/O seasonal allergies    Migraine    migraines   Psoriasis    SVD (spontaneous vaginal delivery)    x 1   Vision abnormalities    wears glasses    Patient Active Problem List   Diagnosis Date Noted   [name] 04/08/2019   Hypotension, unspecified 07/16/2018   Obesity during pregnancy, antepartum 07/16/2018   [name], antepartum 01/31/2018   [name] 01/31/2018   BMI 45.0-49.9, adult (HCC) 03/15/2016    Past Surgical History:  Procedure Laterality Date   DILATION AND CURETTAGE OF UTERUS     DILATION AND EVACUATION N/A 09/20/2017   Procedure: SUCTION DILATATION AND EVACUATION;  Surgeon: Mesick Bing, MD;  Location: [facility];  Service: Gynecology;  Laterality: N/A;   ESOPHAGOGASTRODUODENOSCOPY N/A 10/17/2012   Procedure: ESOPHAGOGASTRODUODENOSCOPY (EGD);  Surgeon: Jon Gills, MD;  Location: [facility] OR;  Service: Gastroenterology;  Laterality: N/A;   THERAPEUTIC ABORTION  09/16/2017   Clinic   WISDOM  TOOTH EXTRACTION      OB History     Gravida  4   Para  3   Term  2   Preterm  1   AB  1   Living  3      SAB      IAB  1   Ectopic      Multiple  0   Live Births  3            Home Medications    Prior to Admission medications   Medication Sig Start Date End Date Taking? Authorizing Provider  doxycycline (VIBRAMYCIN) 100 MG capsule Take 1 capsule (100 mg total) by mouth 2 (two) times daily for 7 days. 03/19/22 03/26/22 Yes Minha Fulco, Janace Aris, MD    Family History Family History  Problem Relation Age of Onset   Cholelithiasis Mother    Hyperlipidemia Mother    Hypertension Mother    Mental illness Mother    Thyroid disease Mother    Cholelithiasis Maternal Uncle    Hyperlipidemia Maternal Uncle    Hypertension Maternal Uncle    Cholelithiasis Maternal Grandmother    Arthritis Maternal Grandmother    COPD Maternal Grandmother    Diabetes Maternal Grandmother    Heart disease Maternal Grandmother    Hyperlipidemia Maternal Grandmother  Hypertension Maternal Grandmother    Stroke Maternal Grandmother    Learning disabilities Paternal Grandmother    Mental illness Paternal Grandmother    Cancer Paternal Grandmother    Hyperlipidemia Paternal Grandfather    Diabetes Paternal Grandfather    Depression Father    Hypertension Father    Ulcers Neg Hx    Alcohol abuse Neg Hx    Asthma Neg Hx    Birth defects Neg Hx    Drug abuse Neg Hx    Early death Neg Hx    Hearing loss Neg Hx    Kidney disease Neg Hx    Mental retardation Neg Hx    Miscarriages / Stillbirths Neg Hx    Vision loss Neg Hx     Social History Social History   Tobacco Use   Smoking status: Never   Smokeless tobacco: Never  Vaping Use   Vaping Use: Never used  Substance Use Topics   Alcohol use: No   Drug use: No     Allergies   Chlorhexidine and Tramadol   Review of Systems Review of Systems   Physical Exam Triage Vital Signs ED Triage Vitals [03/19/22  1222]  Enc Vitals Group     BP (!) 151/87     Pulse Rate 78     Resp 18     Temp 99 F (37.2 C)     Temp Source Oral     SpO2      Weight      Height      Head Circumference      Peak Flow      Pain Score      Pain Loc      Pain Edu?      Excl. in GC?    No data found.  Updated Vital Signs BP (!) 151/87 (BP Location: Left Arm)   Pulse 78   Temp 99 F (37.2 C) (Oral)   Resp 18   Visual Acuity Right Eye Distance:   Left Eye Distance:   Bilateral Distance:    Right Eye Near:   Left Eye Near:    Bilateral Near:     Physical Exam Vitals reviewed.  Constitutional:      General: She is not in acute distress.    Appearance: She is not ill-appearing, toxic-appearing or diaphoretic.  Cardiovascular:     Rate and Rhythm: Normal rate and regular rhythm.  Genitourinary:    Comments: On external female exam there is some small plaque-like rash on her bilateral labia majora and on her mons pubis.  No ulcerations.  Herpes swab is still done of this rash.  Also vaginal swab was done for STDs at the time of the exam Skin:    Coloration: Skin is not pale.     Findings: Rash (There is plaque-like rash on her lower legs.) present.  Neurological:     Mental Status: She is alert and oriented to person, place, and time.  Psychiatric:        Behavior: Behavior normal.      UC Treatments / Results  Labs (all labs ordered are listed, but only abnormal results are displayed) Labs Reviewed  HSV CULTURE AND TYPING  HIV ANTIBODY (ROUTINE TESTING W REFLEX)  RPR  CERVICOVAGINAL ANCILLARY ONLY    EKG   Radiology No results found.  Procedures Procedures (including critical care time)  Medications Ordered in UC Medications  cefTRIAXone (ROCEPHIN) injection 500 mg (has no administration in time range)  Initial Impression / Assessment and Plan / UC Course  I have reviewed the triage vital signs and the nursing notes.  Pertinent labs & imaging results that were available  during my care of the patient were reviewed by me and considered in my medical decision making (see chart for details).     She requests empiric treatment so she is given Rocephin IM today and doxycycline is sent.  I do think her labial rash is most likely due to her psoriasis Final Clinical Impressions(s) / UC Diagnoses   Final diagnoses:  STD exposure  Vaginal discharge  Rash and nonspecific skin eruption     Discharge Instructions      You have been given a shot of ceftriaxone 500 mg--this is for possible gonorrhea  Take doxycycline 100 mg --1 capsule 2 times daily for 7 days.  This is for possible chlamydia  Staff will let you know about any positives on any of your test.  The rash on your bottom may be due to your psoriasis.     ED Prescriptions     Medication Sig Dispense Auth. Provider   doxycycline (VIBRAMYCIN) 100 MG capsule Take 1 capsule (100 mg total) by mouth 2 (two) times daily for 7 days. 14 capsule Marlinda Mike, Janace Aris, MD      PDMP not reviewed this encounter.   Zenia Resides, MD 03/19/22 1258

## 2022-03-19 NOTE — ED Triage Notes (Signed)
Pt was exposure to STD. She would like all STD blood work today.

## 2022-03-19 NOTE — Discharge Instructions (Addendum)
You have been given a shot of ceftriaxone 500 mg--this is for possible gonorrhea  Take doxycycline 100 mg --1 capsule 2 times daily for 7 days.  This is for possible chlamydia  Staff will let you know about any positives on any of your test.  The rash on your bottom may be due to your psoriasis.

## 2022-03-20 LAB — RPR: RPR Ser Ql: NONREACTIVE

## 2022-03-21 ENCOUNTER — Telehealth (HOSPITAL_COMMUNITY): Payer: Self-pay | Admitting: Emergency Medicine

## 2022-03-21 LAB — CERVICOVAGINAL ANCILLARY ONLY
Bacterial Vaginitis (gardnerella): POSITIVE — AB
Candida Glabrata: NEGATIVE
Candida Vaginitis: NEGATIVE
Chlamydia: POSITIVE — AB
Comment: NEGATIVE
Comment: NEGATIVE
Comment: NEGATIVE
Comment: NEGATIVE
Comment: NEGATIVE
Comment: NORMAL
Neisseria Gonorrhea: NEGATIVE
Trichomonas: NEGATIVE

## 2022-03-21 LAB — HSV CULTURE AND TYPING

## 2022-03-21 MED ORDER — METRONIDAZOLE 0.75 % VA GEL: 1.0000 | Freq: Every day | VAGINAL | 0 refills | Status: AC

## 2022-04-25 ENCOUNTER — Ambulatory Visit (INDEPENDENT_AMBULATORY_CARE_PROVIDER_SITE_OTHER): Payer: Medicaid Other | Admitting: Student

## 2022-04-25 ENCOUNTER — Other Ambulatory Visit: Payer: Self-pay | Admitting: Student

## 2022-04-25 ENCOUNTER — Other Ambulatory Visit (HOSPITAL_COMMUNITY)
Admission: RE | Admit: 2022-04-25 | Discharge: 2022-04-25 | Disposition: A | Discharge status: Home / Self Care | Payer: Medicaid Other | Source: Ambulatory Visit | Attending: Advanced Practice Midwife | Admitting: Advanced Practice Midwife

## 2022-04-25 ENCOUNTER — Encounter: Payer: Self-pay | Admitting: Student

## 2022-04-25 VITALS — BP 132/86 | HR 81 | Ht 64.0 in | Wt 326.8 lb

## 2022-04-25 DIAGNOSIS — Z01419 Encounter for gynecological examination (general) (routine) without abnormal findings: Secondary | ICD-10-CM | POA: Insufficient documentation

## 2022-04-25 DIAGNOSIS — Z113 Encounter for screening for infections with a predominantly sexual mode of transmission: Secondary | ICD-10-CM

## 2022-04-25 DIAGNOSIS — Z3046 Encounter for surveillance of implantable subdermal contraceptive: Secondary | ICD-10-CM | POA: Diagnosis not present

## 2022-04-25 DIAGNOSIS — R519 Headache, unspecified: Secondary | ICD-10-CM | POA: Diagnosis not present

## 2022-04-25 DIAGNOSIS — G43909 Migraine, unspecified, not intractable, without status migrainosus: Secondary | ICD-10-CM | POA: Insufficient documentation

## 2022-04-25 DIAGNOSIS — N898 Other specified noninflammatory disorders of vagina: Secondary | ICD-10-CM

## 2022-04-25 DIAGNOSIS — R5383 Other fatigue: Secondary | ICD-10-CM

## 2022-04-25 DIAGNOSIS — G8929 Other chronic pain: Secondary | ICD-10-CM

## 2022-04-25 HISTORY — DX: Migraine, unspecified, not intractable, without status migrainosus: G43.909

## 2022-04-25 NOTE — Progress Notes (Signed)
Pt presents for AEX. Pt requesting Nexplanon removal and STD testing. Pt states Nexplanon causing headaches and she is interested in a low estrogen birth control pill. Pt tested positive for Chlamydia 03/19/22.

## 2022-04-25 NOTE — Progress Notes (Signed)
ANNUAL EXAM Patient name: Traci Castro MRN [id number]  Date of birth: 1996-07-18 Chief Complaint:   No chief complaint on file.  History of Present Illness:   Traci Castro is a 26 y.o. [phone] African-American female being seen today for a routine annual exam.  Current complaints: Headaches, fatigue , breakthrough bleeding, abnormal vaginal discharge/itching/odor/irritation. Patient would like implant removed because of headaches and bleeding pattern. Patient would like a contraceptive method that provides more control of her cycles and requesting to start a CHC.   Patient's last menstrual period was 03/12/2022.   The pregnancy intention screening data noted above was reviewed. Potential methods of contraception were discussed. The patient elected to proceed with No data recorded.   Last pap 2019. Results were: NILM w/ HRHPV not done. H/O abnormal pap: no Last mammogram: n/a. Results were: N/A. Family h/o breast cancer: no Last colonoscopy: n/a. Results were: N/A. Family h/o colorectal cancer: no     04/14/2019    8:31 AM 08/01/2016    9:10 AM 03/15/2016    3:37 PM  Depression screen PHQ 2/9  Decreased Interest 1 0 0  Down, Depressed, Hopeless 1 0 0  PHQ - 2 Score 2 0 0  Altered sleeping 3 0 1  Tired, decreased energy 3 0 1  Change in appetite 0 0 1  Feeling bad or failure about yourself  3 0 0  Trouble concentrating 0 0 1  Moving slowly or fidgety/restless  0 0  Suicidal thoughts 0 0 0  PHQ-9 Score 11 0 4        04/14/2019    8:33 AM 04/08/2019    2:15 PM 08/01/2016    9:10 AM 03/15/2016    3:37 PM  GAD 7 : Generalized Anxiety Score  Nervous, Anxious, on Edge 1 0 0 1  Control/stop worrying 1 0 0 0  Worry too much - different things 1 0 0 3  Trouble relaxing 1 0 0 2  Restless 0 0 0 1  Easily annoyed or irritable 3 1 0 2  Afraid - awful might happen 0 0 0 0  Total GAD 7 Score 7 1 0 9  Anxiety Difficulty  Not difficult at all       Review of Systems:   Pertinent  items are noted in HPI Denies any blurred vision, shortness of breath, chest pain, abdominal pain, , problems with periods, bowel movements, urination, or intercourse unless otherwise stated above. Pertinent History Reviewed:  Reviewed past medical,surgical, social and family history.  Reviewed problem list, medications and allergies. Physical Assessment:   Vitals:   04/25/22 1535  BP: 132/86  Pulse: 81  Weight: (!) 326 lb 12.8 oz (148.2 kg)  Height: 5\' 4"  (1.626 m)  Body mass index is 56.1 kg/m.        Physical Examination:   General appearance - well appearing, and in no distress  Mental status - alert, oriented to person, place, and time  Psych:  She has a normal mood and affect  Skin - warm and dry, normal color, no suspicious lesions noted  Chest - effort normal, all lung fields clear to auscultation bilaterally  Heart - normal rate and regular rhythm  Neck:  midline trachea, no thyromegaly or nodules  Breasts - breasts appear normal, no suspicious masses, no skin or nipple changes or  axillary nodes  Abdomen - morbid, soft abdomen, nontender, nondistended, no masses or organomegaly  Pelvic - VULVA: normal appearing vulva with no masses, tenderness or  lesions  VAGINA: normal appearing vagina with normal color and discharge, no lesions  CERVIX: normal appearing cervix without discharge or lesions, no CMT  Thin prep pap is done w/ HR HPV reflux testing  UTERUS: uterus is felt to be normal size, shape, consistency and nontender   ADNEXA: No adnexal masses or tenderness noted.  Rectal - normal rectal, good sphincter tone, no masses felt.    Extremities:  No swelling or varicosities noted  Chaperone present for exam  No results found for this or any previous visit (from the past 24 hour(s)).  Assessment & Plan:  1. Women's annual routine gynecological examination  - Cytology - PAP( [place]) - CBC - Hemoglobin A1c - Comprehensive metabolic panel - VITAMIN D 25 Hydroxy  (Vit-D Deficiency, Fractures)  2. Routine screening for STI (sexually transmitted infection)  - Cervicovaginal ancillary only - Hepatitis C Antibody - Hepatitis B surface antigen - HIV antibody (with reflex) - RPR  3. Encounter for surveillance of Nexplanon subdermal contraceptive - Counseled patient on the risks associated with CHCs. Patient has negative history, except h/o Migraines. Counseled patient that migraines with Aura are a contraindication to estrogen therapy. Migraine history unclear from EHR and patient provided health history. Patient will evaluate options and schedule appointment for nexplanon removal when ready.   4. Chronic intractable headache, unspecified headache type  5. Fatigue, unspecified type  6. Vaginal discharge   Labs/procedures today: see below  Mammogram: @ 26yo, or sooner if problems Colonoscopy: @ 26yo, or sooner if problems  Orders Placed This Encounter  Procedures   Hepatitis C Antibody   Hepatitis B surface antigen   HIV antibody (with reflex)   RPR   CBC   Hemoglobin A1c   Comprehensive metabolic panel   VITAMIN D 25 Hydroxy (Vit-D Deficiency, Fractures)    Meds: No orders of the defined types were placed in this encounter.   Follow-up: Return in about 1 week (around 05/02/2022) for IN-PERSON; nexplanon removal .  Corlis Hove, NP 04/25/2022 4:16 PM

## 2022-04-26 LAB — COMPREHENSIVE METABOLIC PANEL
ALT: 15 IU/L (ref 0–32)
AST: 10 IU/L (ref 0–40)
Albumin/Globulin Ratio: 1.5 (ref 1.2–2.2)
Albumin: 4.1 g/dL (ref 4.0–5.0)
Alkaline Phosphatase: 82 IU/L (ref 44–121)
BUN/Creatinine Ratio: 17 (ref 9–23)
BUN: 15 mg/dL (ref 6–20)
Bilirubin Total: <0.2 mg/dL (ref 0.0–1.2)
CO2: 22 mmol/L (ref 20–29)
Calcium: 9.3 mg/dL (ref 8.7–10.2)
Chloride: 103 mmol/L (ref 96–106)
Creatinine, Ser: 0.89 mg/dL (ref 0.57–1.00)
Globulin, Total: 2.8 g/dL (ref 1.5–4.5)
Glucose: 93 mg/dL (ref 70–99)
Potassium: 4.8 mmol/L (ref 3.5–5.2)
Sodium: 139 mmol/L (ref 134–144)
Total Protein: 6.9 g/dL (ref 6.0–8.5)
eGFR: 92 mL/min/{1.73_m2} (ref >59)

## 2022-04-26 LAB — CBC
Hematocrit: 37.2 % (ref 34.0–46.6)
Hemoglobin: 12.2 g/dL (ref 11.1–15.9)
MCH: 24.7 pg — ABNORMAL LOW (ref 26.6–33.0)
MCHC: 32.8 g/dL (ref 31.5–35.7)
MCV: 75 fL — ABNORMAL LOW (ref 79–97)
Platelets: 350 10*3/uL (ref 150–450)
RBC: 4.94 x10E6/uL (ref 3.77–5.28)
RDW: 14.4 % (ref 11.7–15.4)
WBC: 9.6 10*3/uL (ref 3.4–10.8)

## 2022-04-26 LAB — CERVICOVAGINAL ANCILLARY ONLY
Bacterial Vaginitis (gardnerella): NEGATIVE
Candida Glabrata: NEGATIVE
Candida Vaginitis: POSITIVE — AB
Chlamydia: NEGATIVE
Comment: NEGATIVE
Comment: NEGATIVE
Comment: NEGATIVE
Comment: NEGATIVE
Comment: NEGATIVE
Comment: NORMAL
Neisseria Gonorrhea: NEGATIVE
Trichomonas: NEGATIVE

## 2022-04-26 LAB — RPR: RPR Ser Ql: NONREACTIVE

## 2022-04-26 LAB — HIV ANTIBODY (ROUTINE TESTING W REFLEX): HIV Screen 4th Generation wRfx: NONREACTIVE

## 2022-04-26 LAB — HEPATITIS B SURFACE ANTIGEN: Hepatitis B Surface Ag: NEGATIVE

## 2022-04-26 LAB — HEMOGLOBIN A1C
Est. average glucose Bld gHb Est-mCnc: 126 mg/dL
Hgb A1c MFr Bld: 6 % — ABNORMAL HIGH (ref 4.8–5.6)

## 2022-04-26 LAB — HEPATITIS C ANTIBODY: Hep C Virus Ab: NONREACTIVE

## 2022-04-26 LAB — VITAMIN D 25 HYDROXY (VIT D DEFICIENCY, FRACTURES): Vit D, 25-Hydroxy: 19.2 ng/mL — ABNORMAL LOW (ref 30.0–100.0)

## 2022-05-02 LAB — CYTOLOGY - PAP: Diagnosis: NEGATIVE

## 2022-05-04 ENCOUNTER — Other Ambulatory Visit: Payer: Self-pay | Admitting: Student

## 2022-05-04 DIAGNOSIS — B3731 Acute candidiasis of vulva and vagina: Secondary | ICD-10-CM

## 2022-05-04 MED ORDER — FLUCONAZOLE 150 MG PO TABS: 150.0000 mg | ORAL_TABLET | Freq: Every day | ORAL | 0 refills | Status: DC

## 2022-05-09 ENCOUNTER — Ambulatory Visit (INDEPENDENT_AMBULATORY_CARE_PROVIDER_SITE_OTHER): Payer: Medicaid Other | Admitting: Obstetrics & Gynecology

## 2022-05-09 VITALS — BP 122/83 | HR 79 | Ht 64.0 in | Wt 323.3 lb

## 2022-05-09 DIAGNOSIS — Z30011 Encounter for initial prescription of contraceptive pills: Secondary | ICD-10-CM | POA: Diagnosis not present

## 2022-05-09 DIAGNOSIS — Z3046 Encounter for surveillance of implantable subdermal contraceptive: Secondary | ICD-10-CM

## 2022-05-09 MED ORDER — NORGESTIMATE-ETH ESTRADIOL 0.25-35 MG-MCG PO TABS: 1.0000 | ORAL_TABLET | Freq: Every day | ORAL | 11 refills | Status: DC

## 2022-05-09 NOTE — Progress Notes (Signed)
     GYNECOLOGY OFFICE PROCEDURE NOTE  Traci Castro is a 26 y.o. [phone] here for Nexplanon removal. Desires Sprintec, has used this in the past without any issues.  Of note, patient has headaches/migraines and was warned about use of estrogen containing pills. She reports the Sprintec did not cause this in the past, but her headaches worsened on Nexplanon.   Last pap smear was on 04/25/2022 and was normal.  No other gynecologic concerns.   Nexplanon Removal Patient identified, informed consent performed, consent signed.   Appropriate time out taken. Nexplanon site identified with some difficulty due to habitus.  Area prepped in usual sterile fashon. One ml of 1% lidocaine was used to anesthetize the area at the distal end of the implant. A small stab incision was made right beside the implant on the distal portion.  The Nexplanon rod was grasped using hemostats and removed without difficulty.  There was minimal blood loss. There were no complications.  3 ml of 1% lidocaine was injected around the incision for post-procedure analgesia.  Steri-strips were applied over the small incision.  A pressure bandage was applied to reduce any bruising.    The patient tolerated the procedure well and was given post procedure instructions.  Patient is planning to use Sprintec for contraception/, this was prescribed. She was told to follow up if any continued/worsening headaches or as scheduled in 2 months for BP/OCP check.      Jaynie Collins, MD, FACOG Obstetrician & Gynecologist, [facility] for [employer], [facility]

## 2022-05-09 NOTE — Patient Instructions (Signed)
Nexplanon Instructions After Removal  Keep bandage clean and dry for 24 hours  May use ice/Tylenol/Ibuprofen for soreness or pain  If you develop fever, drainage or increased warmth from incision site-contact office immediately   

## 2022-05-09 NOTE — Progress Notes (Signed)
Pt is in the office for nexplanon removal.

## 2022-05-10 ENCOUNTER — Other Ambulatory Visit: Payer: Self-pay | Admitting: Student

## 2022-05-10 DIAGNOSIS — R7303 Prediabetes: Secondary | ICD-10-CM

## 2022-05-10 DIAGNOSIS — E559 Vitamin D deficiency, unspecified: Secondary | ICD-10-CM

## 2022-06-28 ENCOUNTER — Ambulatory Visit (INDEPENDENT_AMBULATORY_CARE_PROVIDER_SITE_OTHER): Payer: Medicaid Other | Admitting: General Practice

## 2022-06-28 VITALS — BP 125/85 | HR 84 | Ht 63.0 in | Wt 337.0 lb

## 2022-06-28 DIAGNOSIS — Z3201 Encounter for pregnancy test, result positive: Secondary | ICD-10-CM | POA: Diagnosis not present

## 2022-06-28 DIAGNOSIS — Z3200 Encounter for pregnancy test, result unknown: Secondary | ICD-10-CM

## 2022-06-28 DIAGNOSIS — Z3480 Encounter for supervision of other normal pregnancy, unspecified trimester: Secondary | ICD-10-CM | POA: Insufficient documentation

## 2022-06-28 LAB — POCT URINE PREGNANCY: Preg Test, Ur: POSITIVE — AB

## 2022-06-28 NOTE — Progress Notes (Signed)
Traci Castro presents today for UPT. She has no unusual complaints. LMP: 05-13-22    OBJECTIVE: Appears well, in no apparent distress.  OB History     Gravida  4   Para  3   Term  2   Preterm  1   AB  1   Living  3      SAB      IAB  1   Ectopic      Multiple  0   Live Births  3          Home UPT Result: Positive In-Office UPT result: Positive  I have reviewed the patient's medical, obstetrical, social, and family histories, and medications.   ASSESSMENT: Positive pregnancy test  PLAN Prenatal care to be completed at: [facility]

## 2022-07-05 ENCOUNTER — Inpatient Hospital Stay (HOSPITAL_COMMUNITY): Payer: Medicaid Other

## 2022-07-05 ENCOUNTER — Inpatient Hospital Stay (HOSPITAL_COMMUNITY)
Admission: AD | Admit: 2022-07-05 | Discharge: 2022-07-05 | Disposition: A | Discharge status: Home / Self Care | Payer: Medicaid Other | Attending: Obstetrics and Gynecology | Admitting: Obstetrics and Gynecology

## 2022-07-05 ENCOUNTER — Encounter (HOSPITAL_COMMUNITY): Payer: Self-pay | Admitting: Obstetrics and Gynecology

## 2022-07-05 DIAGNOSIS — R5383 Other fatigue: Secondary | ICD-10-CM | POA: Insufficient documentation

## 2022-07-05 DIAGNOSIS — O26899 Other specified pregnancy related conditions, unspecified trimester: Secondary | ICD-10-CM

## 2022-07-05 DIAGNOSIS — O219 Vomiting of pregnancy, unspecified: Secondary | ICD-10-CM | POA: Insufficient documentation

## 2022-07-05 DIAGNOSIS — O26891 Other specified pregnancy related conditions, first trimester: Secondary | ICD-10-CM | POA: Diagnosis present

## 2022-07-05 DIAGNOSIS — O99341 Other mental disorders complicating pregnancy, first trimester: Secondary | ICD-10-CM | POA: Insufficient documentation

## 2022-07-05 DIAGNOSIS — O418X10 Other specified disorders of amniotic fluid and membranes, first trimester, not applicable or unspecified: Secondary | ICD-10-CM

## 2022-07-05 DIAGNOSIS — O208 Other hemorrhage in early pregnancy: Secondary | ICD-10-CM | POA: Insufficient documentation

## 2022-07-05 DIAGNOSIS — O218 Other vomiting complicating pregnancy: Secondary | ICD-10-CM | POA: Diagnosis not present

## 2022-07-05 DIAGNOSIS — O99891 Other specified diseases and conditions complicating pregnancy: Secondary | ICD-10-CM | POA: Diagnosis not present

## 2022-07-05 DIAGNOSIS — A084 Viral intestinal infection, unspecified: Secondary | ICD-10-CM | POA: Insufficient documentation

## 2022-07-05 DIAGNOSIS — Z3A01 Less than 8 weeks gestation of pregnancy: Secondary | ICD-10-CM | POA: Diagnosis not present

## 2022-07-05 DIAGNOSIS — M549 Dorsalgia, unspecified: Secondary | ICD-10-CM | POA: Diagnosis not present

## 2022-07-05 DIAGNOSIS — M62830 Muscle spasm of back: Secondary | ICD-10-CM | POA: Diagnosis not present

## 2022-07-05 DIAGNOSIS — R0602 Shortness of breath: Secondary | ICD-10-CM | POA: Insufficient documentation

## 2022-07-05 DIAGNOSIS — O468X1 Other antepartum hemorrhage, first trimester: Secondary | ICD-10-CM

## 2022-07-05 DIAGNOSIS — R109 Unspecified abdominal pain: Secondary | ICD-10-CM

## 2022-07-05 LAB — WET PREP, GENITAL
Sperm: NONE SEEN
Trich, Wet Prep: NONE SEEN
WBC, Wet Prep HPF POC: <10 (ref <10)
Yeast Wet Prep HPF POC: NONE SEEN

## 2022-07-05 LAB — URINALYSIS, ROUTINE W REFLEX MICROSCOPIC
Bilirubin Urine: NEGATIVE
Glucose, UA: NEGATIVE mg/dL
Hgb urine dipstick: NEGATIVE
Ketones, ur: NEGATIVE mg/dL
Leukocytes,Ua: NEGATIVE
Nitrite: NEGATIVE
Protein, ur: NEGATIVE mg/dL
Specific Gravity, Urine: 1.026 (ref 1.005–1.030)
pH: 6 (ref 5.0–8.0)

## 2022-07-05 LAB — HCG, QUANTITATIVE, PREGNANCY: hCG, Beta Chain, Quant, S: 64354 m[IU]/mL — ABNORMAL HIGH (ref <5)

## 2022-07-05 LAB — CBC
HCT: 37.3 % (ref 36.0–46.0)
Hemoglobin: 11.7 g/dL — ABNORMAL LOW (ref 12.0–15.0)
MCH: 24.4 pg — ABNORMAL LOW (ref 26.0–34.0)
MCHC: 31.4 g/dL (ref 30.0–36.0)
MCV: 77.9 fL — ABNORMAL LOW (ref 80.0–100.0)
Platelets: 291 10*3/uL (ref 150–400)
RBC: 4.79 MIL/uL (ref 3.87–5.11)
RDW: 15.4 % (ref 11.5–15.5)
WBC: 10.2 10*3/uL (ref 4.0–10.5)
nRBC: 0 % (ref 0.0–0.2)

## 2022-07-05 MED ORDER — ACETAMINOPHEN 500 MG PO TABS
500.0000 mg | ORAL_TABLET | Freq: Once | ORAL | Status: AC
  Administered 2022-07-05: 500 mg via ORAL
  Filled 2022-07-05: qty 1

## 2022-07-05 MED ORDER — METOCLOPRAMIDE HCL 10 MG PO TABS: 10.0000 mg | ORAL_TABLET | Freq: Three times a day (TID) | ORAL | 0 refills | Status: DC | PRN

## 2022-07-05 MED ORDER — PROMETHAZINE HCL 25 MG/ML IJ SOLN
25.0000 mg | Freq: Once | INTRAMUSCULAR | Status: AC
  Administered 2022-07-05: 25 mg via INTRAMUSCULAR
  Filled 2022-07-05: qty 1

## 2022-07-05 NOTE — Discharge Instructions (Signed)

## 2022-07-05 NOTE — MAU Note (Signed)
Traci Castro is a 26 y.o. at [redacted]w[redacted]d here in MAU reporting: been having stomach pains, starts at belly button down.been having back spasms, lower back.  Had diarrhea (watery) 4 times today, had been having loose once a day the last few days.  Unsure if SOB is related to abd pain, or something else..  No bleeding   Onset of complaint: 2days ago. Pain score: abd: 8; back :8 Vitals:   07/05/22 1621 07/05/22 1624  BP:  137/81  Pulse:  73  Resp:  18  Temp:  98 F (36.7 C)  SpO2: 100% 100%      Lab orders placed from triage:  urine

## 2022-07-05 NOTE — MAU Provider Note (Addendum)
History     CSN: [id number]  Arrival date and time: 07/05/22 1552   None     Chief Complaint  Patient presents with   Abdominal Pain   Back Pain   Diarrhea   Shortness of Breath   CC of lower belly pain that goes from the belly button down, back spasms, and diarrhea. This started 2 days ago. Back pain comes and goes, but belly pain has been constant. Had 4 episodes of diarrhea today, 2 days ago had one episode. 2 stools looked liquidy, but the others looked loose. No blood in stools. They appeared light brown. No recent travel, no new restaurants. Had a sub today. Has been eating crackers, ginger ale, and vegetable soup. Sub had warm deli meat on it. Had diarrhea before she ate this. No known sick exposures. Had one episode of emesis the day before the diarrhea started. Has been nauseous this pregnancy. No blood in vomit, just looked like what she ate. No vaginal bleeding. Does report some new vaginal discharge that is white-colored but not odorous. Consistency is smooth. Cramping feels separate from the diarrhea. Just came off antibiotics that she was given for the discharge. The discharge went away after this. When she found out she was pregnant, the discharge came back the same as it was before. Some SOB with stomach pain. Did have a headache yesterday that went away with Tylenol. The only medication she's taken is Tylenol, which only helped the HA. Nothing has made it better or worse. Does feel tired and like she has to lie down after diarrhea episodes. Does have a dog at home. Lives with mom, sister, and three boys. Nothing similar to this happened in past pregnancies. Youngest child is 2 and is in daycare, but no emails from school saying that anyone is sick. Does not drink stream water or hike/do outdoorsy things in general.   No fever, chills, sweats, cough, CP. No dizziness or lightheadedness.   Not currently taking a prenatal vitamin. Next appointment is on the 13th of November at  [facility].    OB History     Gravida  5   Para  3   Term  2   Preterm  1   AB  1   Living  3      SAB      IAB  1   Ectopic      Multiple  0   Live Births  3           Past Medical History:  Diagnosis Date   Anemia    Anxiety    no meds   Bipolar disorder (HCC)    Blood transfusion without reported diagnosis    for pp anemia not PPH   Depression    no meds   Encounter for induction of labor 07/15/2019   Gastritis    2 bouts   H/O seasonal allergies    Migraine    migraines   Psoriasis    SVD (spontaneous vaginal delivery)    x 1   Vision abnormalities    wears glasses    Past Surgical History:  Procedure Laterality Date   DILATION AND CURETTAGE OF UTERUS     DILATION AND EVACUATION N/A 09/20/2017   Procedure: SUCTION DILATATION AND EVACUATION;  Surgeon: [place] Bing, MD;  Location: [facility];  Service: Gynecology;  Laterality: N/A;   ESOPHAGOGASTRODUODENOSCOPY N/A 10/17/2012   Procedure: ESOPHAGOGASTRODUODENOSCOPY (EGD);  Surgeon: Jon Gills, MD;  Location: [facility] OR;  Service: Gastroenterology;  Laterality: N/A;   THERAPEUTIC ABORTION  09/16/2017   Clinic   WISDOM TOOTH EXTRACTION      Family History  Problem Relation Age of Onset   Cholelithiasis Mother    Hyperlipidemia Mother    Hypertension Mother    Mental illness Mother    Thyroid disease Mother    Cholelithiasis Maternal Uncle    Hyperlipidemia Maternal Uncle    Hypertension Maternal Uncle    Cholelithiasis Maternal Grandmother    Arthritis Maternal Grandmother    COPD Maternal Grandmother    Diabetes Maternal Grandmother    Heart disease Maternal Grandmother    Hyperlipidemia Maternal Grandmother    Hypertension Maternal Grandmother    Stroke Maternal Grandmother    Learning disabilities Paternal Grandmother    Mental illness Paternal Grandmother    Cancer Paternal Grandmother    Hyperlipidemia Paternal Grandfather    Diabetes Paternal Grandfather    Depression Father     Hypertension Father    Ulcers Neg Hx    Alcohol abuse Neg Hx    Asthma Neg Hx    Birth defects Neg Hx    Drug abuse Neg Hx    Early death Neg Hx    Hearing loss Neg Hx    Kidney disease Neg Hx    Mental retardation Neg Hx    Miscarriages / Stillbirths Neg Hx    Vision loss Neg Hx     Social History   Tobacco Use   Smoking status: Never   Smokeless tobacco: Never  Vaping Use   Vaping Use: Never used  Substance Use Topics   Alcohol use: No   Drug use: No    Allergies:  Allergies  Allergen Reactions   Chlorhexidine Itching and Rash    Severe itching, redness.    Tramadol Nausea And Vomiting    Medications Prior to Admission  Medication Sig Dispense Refill Last Dose   fluconazole (DIFLUCAN) 150 MG tablet Take 1 tablet (150 mg total) by mouth daily. (Patient not taking: Reported on 05/09/2022) 1 tablet 0    norgestimate-ethinyl estradiol (ORTHO-CYCLEN) 0.25-35 MG-MCG tablet Take 1 tablet by mouth daily. (Patient not taking: Reported on 06/28/2022) 28 tablet 11     Review of Systems  Constitutional:  Positive for fatigue. Negative for chills and fever.  HENT:  Negative for sore throat.   Respiratory:  Positive for shortness of breath. Negative for cough.   Cardiovascular:  Negative for chest pain.  Gastrointestinal:  Positive for abdominal pain, diarrhea, nausea and vomiting. Negative for blood in stool and constipation.  Genitourinary:  Positive for vaginal discharge. Negative for vaginal bleeding.  Musculoskeletal:  Positive for back pain.  Skin:        Has psoriasis  Neurological:  Positive for headaches. Negative for dizziness and light-headedness.   Physical Exam   Blood pressure 137/81, pulse 73, temperature 98 F (36.7 C), temperature source Oral, resp. rate 18, height 5\' 3"  (1.6 m), weight (!) 151.8 kg, last menstrual period 05/13/2022, SpO2 100 %.  Physical Exam Vitals and nursing note reviewed.  Constitutional:      General: She is not in acute  distress.    Appearance: She is obese. She is not ill-appearing, toxic-appearing or diaphoretic.  HENT:     Head: Normocephalic and atraumatic.  Eyes:     General: No scleral icterus.    Extraocular Movements: Extraocular movements intact.  Cardiovascular:     Rate and Rhythm: Normal rate and regular rhythm.  Heart sounds: Normal heart sounds.  Pulmonary:     Effort: Pulmonary effort is normal.     Breath sounds: Normal breath sounds.  Abdominal:     General: Bowel sounds are normal. There is no distension. There are no signs of injury.     Palpations: Abdomen is soft.     Tenderness: There is abdominal tenderness in the right lower quadrant and left lower quadrant. There is no guarding or rebound. Negative signs include Murphy's sign and McBurney's sign.     Comments: Obese  Skin:    General: Skin is warm and dry.     Comments: Guttate raised lesions with overlying silver scale from neck down.   Neurological:     General: No focal deficit present.     Mental Status: She is alert.  Psychiatric:        Mood and Affect: Mood normal.        Behavior: Behavior normal.     TVUS impression: 1. Single live intrauterine pregnancy estimated gestational age based on crown-rump length 6 weeks 5 days for ultrasound [facility] 02/23/2023. 2. Small subchorionic hemorrhage.  MAU Course  Procedures Results for orders placed or performed during the hospital encounter of 07/05/22 (from the past 24 hour(s))  Urinalysis, Routine w reflex microscopic Urine, Clean Catch     Status: Abnormal   Collection Time: 07/05/22  4:47 PM  Result Value Ref Range   Color, Urine YELLOW YELLOW   APPearance HAZY (A) CLEAR   Specific Gravity, Urine 1.026 1.005 - 1.030   pH 6.0 5.0 - 8.0   Glucose, UA NEGATIVE NEGATIVE mg/dL   Hgb urine dipstick NEGATIVE NEGATIVE   Bilirubin Urine NEGATIVE NEGATIVE   Ketones, ur NEGATIVE NEGATIVE mg/dL   Protein, ur NEGATIVE NEGATIVE mg/dL   Nitrite NEGATIVE NEGATIVE    Leukocytes,Ua NEGATIVE NEGATIVE  Wet prep, genital     Status: Abnormal   Collection Time: 07/05/22  5:47 PM   Specimen: Vaginal  Result Value Ref Range   Yeast Wet Prep HPF POC NONE SEEN NONE SEEN   Trich, Wet Prep NONE SEEN NONE SEEN   Clue Cells Wet Prep HPF POC PRESENT (A) NONE SEEN   WBC, Wet Prep HPF POC <10 <10   Sperm NONE SEEN   CBC     Status: Abnormal   Collection Time: 07/05/22  5:48 PM  Result Value Ref Range   WBC 10.2 4.0 - 10.5 K/uL   RBC 4.79 3.87 - 5.11 MIL/uL   Hemoglobin 11.7 (L) 12.0 - 15.0 g/dL   HCT 37.3 36.0 - 46.0 %   MCV 77.9 (L) 80.0 - 100.0 fL   MCH 24.4 (L) 26.0 - 34.0 pg   MCHC 31.4 30.0 - 36.0 g/dL   RDW 15.4 11.5 - 15.5 %   Platelets 291 150 - 400 K/uL   nRBC 0.0 0.0 - 0.2 %  hCG, quantitative, pregnancy     Status: Abnormal   Collection Time: 07/05/22  5:48 PM  Result Value Ref Range   hCG, Beta Chain, Quant, S 64,354 (H) <5 mIU/mL   US OB Comp Less 14 Wks  Result Date: 07/05/2022 CLINICAL DATA:  Pregnant patient in first-trimester pregnancy with abdominal pain. EXAM: OBSTETRIC <14 WK ULTRASOUND TECHNIQUE: Transabdominal ultrasound was performed for evaluation of the gestation as well as the maternal uterus and adnexal regions. Patient declined transvaginal exam. COMPARISON:  None this pregnancy. FINDINGS: Intrauterine gestational sac: Single Yolk sac:  Visualized. Embryo:  Visualized. Cardiac Activity: Visualized. Heart  Rate: 129 bpm CRL:   8.2 mm   6 w 5 d                  [place] EDC: 02/23/2023 Subchorionic hemorrhage:  Small measuring 1.4 x 0.5 cm. Maternal uterus/adnexae: The right ovary contains a simple 4.3 x 4 x 3.5 cm cyst. Ovarian blood flow is demonstrated. The left ovary is normal. There is no pelvic free fluid. No adnexal mass. IMPRESSION: 1. Single live intrauterine pregnancy estimated gestational age based on crown-rump length 6 weeks 5 days for ultrasound [facility] 02/23/2023. 2. Small subchorionic hemorrhage. Electronically Signed   By: Keith Rake M.D.   On: 07/05/2022 19:48    MDM As pt has not had confirmed IUP, confirmed IUP given cramping. Ordered CBC, TVUS, B-HCG quant. Have her ABO type on file (A+). Obtained swab for STI and other infections (GC/CT, wet prep). Gave IM phenergan for nausea. Ddx included, but were not limited to, viral gastroenteritis, bacterial enterocolitis, ectopic, ovarian torsion, cholecystitis, appendicitis, infectious. Given her psoriasis, could also consider other autoimmune causes of diarrhea such as Chron's or UC, but these are unlikely given acuity of her Sx. Most likely viral gastroenteritis.   [place] confirmed IUP. FHT 129. Nml BHCG quant. CBC with mildly decreased Hb of 11.7, consistent w/pregnancy. Nml white count. Wet prep with some clue cells. GC/CT still pending. UA nml. Nausea improved with IM phenergan, but developed HA. Gave Tylenol.  Assessment and Plan  Viral gastroenteritis - Pt should drink plenty of fluids - Will send home with immodium  Vaginal discharge - Clue cells on wet prep, but does not meet AMSEL criteria for Tx - Pending GC/CT - F/u outpatient  Subchorionic hemorrhage - Small, and pt not bleeding - Continue to monitor outpatient - Bleeding precautions  Pregnancy, 6-7 wks - IUP confirmed by [place] - Will prescribe prenatal vitamin - Continue prenatal care as scheduled  Wilhemina Cash 07/05/2022, 8:05 PM     Attestation of Supervision of Student:  I confirm that I have verified the information documented in the  resident's note and that I have also personally   I have verified that all services and findings are accurately documented in this student's note; and I agree with management and plan as outlined in the documentation. I have also made any necessary editorial changes.  1. Abdominal pain affecting pregnancy  -GI related vs pregnancy changes. Ultrasound shows IUP measuring [redacted]w[redacted]d (EDD updated). She has benign abdominal exam & is stable.   2. Nausea and vomiting  during pregnancy  -Rx reglan  3. Subchorionic hematoma in first trimester, single or unspecified fetus  -Reviewed bleeding precautions & reasons to return to MAU -RH positive  4. [redacted] weeks gestation of pregnancy  -Keep scheduled OB follow up     Jorje Guild, NP Center for [employer], [place] 07/05/2022 8:21 PM

## 2022-07-06 LAB — GC/CHLAMYDIA PROBE AMP (~~LOC~~) NOT AT ARMC
Chlamydia: NEGATIVE
Comment: NEGATIVE
Comment: NORMAL
Neisseria Gonorrhea: NEGATIVE

## 2022-07-09 ENCOUNTER — Ambulatory Visit: Payer: Medicaid Other

## 2022-07-16 ENCOUNTER — Telehealth: Payer: Self-pay | Admitting: *Deleted

## 2022-07-16 NOTE — Telephone Encounter (Signed)
TC to offer virtual visit 07/17/22 as viability and dating [place] have already been completed. No answer. LVM.

## 2022-07-17 ENCOUNTER — Ambulatory Visit: Payer: Medicaid Other | Admitting: *Deleted

## 2022-07-17 VITALS — BP 138/67 | HR 70 | Ht 63.0 in | Wt 333.6 lb

## 2022-07-17 DIAGNOSIS — Z3480 Encounter for supervision of other normal pregnancy, unspecified trimester: Secondary | ICD-10-CM

## 2022-07-17 MED ORDER — PROMETHAZINE HCL 25 MG PO TABS: 25.0000 mg | ORAL_TABLET | Freq: Four times a day (QID) | ORAL | 1 refills | Status: DC | PRN

## 2022-07-17 MED ORDER — BLOOD PRESSURE KIT DEVI: 1.0000 | 0 refills | Status: DC

## 2022-07-17 NOTE — Progress Notes (Signed)
New OB Intake  I connected withNAME@ on 07/17/22 at  3:10 PM EST by In Person Visit and verified that I am speaking with the correct person using two identifiers. Nurse is located at [facility] and pt is located at [place].  I discussed the limitations, risks, security and privacy concerns of performing an evaluation and management service by telephone and the availability of in person appointments. I also discussed with the patient that there may be a patient responsible charge related to this service. The patient expressed understanding and agreed to proceed.  I explained I am completing New OB Intake today. We discussed EDD of 02/23/23 that is based [place] on 02/23/23 at [redacted]w[redacted]d. Pt is G5/P3. I reviewed her allergies, medications, Medical/Surgical/OB history, and appropriate screenings. I informed her of [facility] services. [facility] information placed in AVS. Based on history, this is a low risk pregnancy.  Patient Active Problem List   Diagnosis Date Noted   [name], antepartum 06/28/2022   Migraine headache 04/25/2022   Hypotension, unspecified 07/16/2018   [name] 01/31/2018   BMI 45.0-49.9, adult (HCC) 03/15/2016    Concerns addressed today  Delivery Plans Plans to deliver at [facility]. Patient given information for [facility] Healthy [facility] website for more information about [facility]. Patient is interested in water birth. Offered upcoming OB visit with CNM to discuss further.  MyChart/Babyscripts MyChart access verified. I explained pt will have some visits in office and some virtually. Babyscripts instructions given and order placed. Patient verifies receipt of registration text/e-mail. Account successfully created and app downloaded.  Blood Pressure Cuff/Weight Scale Blood pressure cuff ordered for patient to pick-up from [employer]. Explained after first prenatal appt pt will check weekly and document in Babyscripts. Patient does not have  weight scale; patient may purchase if they desire to track weight weekly in Babyscripts.  Anatomy US Explained first scheduled [place] will be around 19 weeks. Anatomy US scheduled for 19 wks at [facility]. Pt notified to arrive at [facility].  Labs Discussed Avelina Laine genetic screening with patient. Would like both Panorama and Horizon drawn at new OB visit. Routine prenatal labs needed.  COVID Vaccine Patient has had COVID vaccine.  Social Determinants of Health Food Insecurity: Patient denies food insecurity. WIC Referral: Patient is interested in referral to [facility]  Transportation: Patient denies transportation needs. Childcare: Discussed no children allowed at ultrasound appointments. Offered childcare services; patient declines childcare services at this time.  First visit review I reviewed new OB appt with patient. I explained they will have a provider visit that includes vaginal swab and labs. Explained pt will be seen by Dr. Clearance Coots at first visit; encounter routed to appropriate provider. Explained that patient will be seen by pregnancy navigator following visit with provider.   Harrel Lemon, RN 07/17/2022  3:23 PM

## 2022-08-27 NOTE — L&D Delivery Note (Addendum)
OB/GYN Faculty Practice Delivery Note  Traci Castro is a 27 y.o. [phone] s/p SVD at [redacted]w[redacted]d. She was admitted for IOL for gestational hypertension.   ROM: 0h 67m with clear fluid GBS Status: Positive- on PCN Maximum Maternal Temperature: 98.4 Fahreinheit  Labor Progress: Went to bedside with RN as patient noted to have recurrent late decels, and did position changes. With contractions patient noted to be bearing down, SVE revealed complete +2 with bulging bag of fluid.   Delivery Date/Time: 02/04/23 at  Delivery: AROM of clear fluid performed and head delivered LOA. No nuchal cord present. Shoulder and body delivered in usual fashion. Infant with spontaneous cry, placed on mother's abdomen, dried and stimulated. Cord clamped x 2 after 1-minute delay, and cut by myself. Infant taken to warmer for suctioning and blow by oxygen and cord arterial blood gas drawn as well as cord blood drawn. Infant improved required no further oxygen or resuscitative measures. Placenta delivered spontaneously with gentle cord traction. Fundus firm with massage and Pitocin. Labia, perineum, vagina, and cervix were inspected, no lacerations appreciated.   Placenta: complete, three vessel cord appreciated Complications: none Lacerations: None EBL: 200 mL Analgesia: IV fentanyl x1  Postpartum Planning [x]  message to sent to schedule follow-up   Infant: viable female infant  APGARs 7 and 9  weight pending  Burley Saver, MD Center for [facility], [facility]    Attestation of Attending Supervision of Obstetric Fellow: Evaluation and management procedures were performed by the Obstetric Fellow under my supervision and collaboration.  I have reviewed the Obstetric Fellow's note and chart, and I agree with the management and plan as documented.  Venora Maples, MD Family Medicine Attending, [facility] for [facility], [facility] 02/04/2023 11:12  AM

## 2022-09-01 ENCOUNTER — Other Ambulatory Visit: Payer: Self-pay

## 2022-09-01 ENCOUNTER — Inpatient Hospital Stay (HOSPITAL_COMMUNITY)
Admission: AD | Admit: 2022-09-01 | Discharge: 2022-09-01 | Disposition: A | Discharge status: Home / Self Care | Payer: Medicaid Other | Attending: Obstetrics and Gynecology | Admitting: Obstetrics and Gynecology

## 2022-09-01 DIAGNOSIS — R0989 Other specified symptoms and signs involving the circulatory and respiratory systems: Secondary | ICD-10-CM | POA: Insufficient documentation

## 2022-09-01 DIAGNOSIS — O368120 Decreased fetal movements, second trimester, not applicable or unspecified: Secondary | ICD-10-CM | POA: Diagnosis not present

## 2022-09-01 DIAGNOSIS — J111 Influenza due to unidentified influenza virus with other respiratory manifestations: Secondary | ICD-10-CM

## 2022-09-01 DIAGNOSIS — O219 Vomiting of pregnancy, unspecified: Secondary | ICD-10-CM | POA: Insufficient documentation

## 2022-09-01 DIAGNOSIS — Z1152 Encounter for screening for COVID-19: Secondary | ICD-10-CM | POA: Insufficient documentation

## 2022-09-01 DIAGNOSIS — R051 Acute cough: Secondary | ICD-10-CM

## 2022-09-01 DIAGNOSIS — Z3A15 15 weeks gestation of pregnancy: Secondary | ICD-10-CM | POA: Diagnosis not present

## 2022-09-01 DIAGNOSIS — O26892 Other specified pregnancy related conditions, second trimester: Secondary | ICD-10-CM | POA: Diagnosis present

## 2022-09-01 DIAGNOSIS — R058 Other specified cough: Secondary | ICD-10-CM | POA: Diagnosis not present

## 2022-09-01 DIAGNOSIS — R509 Fever, unspecified: Secondary | ICD-10-CM | POA: Diagnosis not present

## 2022-09-01 LAB — URINALYSIS, ROUTINE W REFLEX MICROSCOPIC
Bilirubin Urine: NEGATIVE
Glucose, UA: NEGATIVE mg/dL
Hgb urine dipstick: NEGATIVE
Ketones, ur: 5 mg/dL — AB
Leukocytes,Ua: NEGATIVE
Nitrite: NEGATIVE
Protein, ur: NEGATIVE mg/dL
Specific Gravity, Urine: 1.014 (ref 1.005–1.030)
pH: 7 (ref 5.0–8.0)

## 2022-09-01 LAB — RESP PANEL BY RT-PCR (RSV, FLU A&B, COVID)  RVPGX2
Influenza A by PCR: POSITIVE — AB
Influenza B by PCR: NEGATIVE
Resp Syncytial Virus by PCR: NEGATIVE
SARS Coronavirus 2 by RT PCR: NEGATIVE

## 2022-09-01 MED ORDER — OSELTAMIVIR PHOSPHATE 75 MG PO CAPS: 75.0000 mg | ORAL_CAPSULE | Freq: Two times a day (BID) | ORAL | 0 refills | Status: DC

## 2022-09-01 MED ORDER — ONDANSETRON HCL 4 MG PO TABS: 4.0000 mg | ORAL_TABLET | Freq: Three times a day (TID) | ORAL | 0 refills | Status: DC | PRN

## 2022-09-01 NOTE — MAU Provider Note (Signed)
History     CSN: [id number]  Arrival date and time: 09/01/22 1752   Event Date/Time   First Provider Initiated Contact with Patient 09/01/22 1914      Chief Complaint  Patient presents with   Decreased Fetal Movement   Cough   Traci Castro , a  27 y.o. [phone] at [redacted]w[redacted]d presents to MAU with complaints of cough and fever for the last 2 days. Patient reports her mother and son are also sick at home and her mother was just diagnosed with the flu. Patient reports feeling feverish with chill at home, but hasn't taken temp and denies attempting to relieve symptoms. She reports a non-productive cough. Denies Shortness of breath or difficulty breathing. She also noted vomiting 2 times today, but not since this afternoon. She denies vaginal bleeding, abdominal pain. FHT obtained in triage.   Due to patient acuity in MAU, COVID panel ordered from triage.           OB History     Gravida  5   Para  3   Term  2   Preterm  1   AB  1   Living  3      SAB      IAB  1   Ectopic      Multiple  0   Live Births  3           Past Medical History:  Diagnosis Date   Anemia    Anxiety    no meds   Bipolar disorder ([place])    Blood transfusion without reported diagnosis    for pp anemia not PPH   Depression    no meds   Encounter for induction of labor 07/15/2019   Gastritis    2 bouts   H/O seasonal allergies    Migraine    migraines   Psoriasis    SVD (spontaneous vaginal delivery)    x 1   Vision abnormalities    wears glasses    Past Surgical History:  Procedure Laterality Date   DILATION AND CURETTAGE OF UTERUS     DILATION AND EVACUATION N/A 09/20/2017   Procedure: SUCTION DILATATION AND EVACUATION;  Surgeon: Aletha Halim, MD;  Location: [facility];  Service: Gynecology;  Laterality: N/A;   ESOPHAGOGASTRODUODENOSCOPY N/A 10/17/2012   Procedure: ESOPHAGOGASTRODUODENOSCOPY (EGD);  Surgeon: Oletha Blend, MD;  Location: [facility];  Service: Gastroenterology;   Laterality: N/A;   THERAPEUTIC ABORTION  09/16/2017   Clinic   WISDOM TOOTH EXTRACTION      Family History  Problem Relation Age of Onset   Cholelithiasis Mother    Hyperlipidemia Mother    Hypertension Mother    Mental illness Mother    Thyroid disease Mother    Cholelithiasis Maternal Uncle    Hyperlipidemia Maternal Uncle    Hypertension Maternal Uncle    Cholelithiasis Maternal Grandmother    Arthritis Maternal Grandmother    COPD Maternal Grandmother    Diabetes Maternal Grandmother    Heart disease Maternal Grandmother    Hyperlipidemia Maternal Grandmother    Hypertension Maternal Grandmother    Stroke Maternal Grandmother    Learning disabilities Paternal Grandmother    Mental illness Paternal Grandmother    Cancer Paternal Grandmother    Hyperlipidemia Paternal Grandfather    Diabetes Paternal Grandfather    Depression Father    Hypertension Father    Ulcers Neg Hx    Alcohol abuse Neg Hx    Asthma Neg Hx  Birth defects Neg Hx    Drug abuse Neg Hx    Early death Neg Hx    Hearing loss Neg Hx    Kidney disease Neg Hx    Mental retardation Neg Hx    Miscarriages / Stillbirths Neg Hx    Vision loss Neg Hx     Social History   Tobacco Use   Smoking status: Never   Smokeless tobacco: Never  Vaping Use   Vaping Use: Never used  Substance Use Topics   Alcohol use: No   Drug use: No    Allergies:  Allergies  Allergen Reactions   Chlorhexidine Itching and Rash    Severe itching, redness.    Tramadol Nausea And Vomiting    Medications Prior to Admission  Medication Sig Dispense Refill Last Dose   Blood Pressure Monitoring (BLOOD PRESSURE KIT) DEVI 1 Device by Does not apply route once a week. 1 each 0    metoCLOPramide (REGLAN) 10 MG tablet Take 1 tablet (10 mg total) by mouth every 8 (eight) hours as needed. 30 tablet 0    Prenatal Vit-Fe Fumarate-FA (PRENATAL VITAMIN PO) Take 1 tablet by mouth daily.      promethazine (PHENERGAN) 25 MG tablet  Take 1 tablet (25 mg total) by mouth every 6 (six) hours as needed for nausea or vomiting. 30 tablet 1     Review of Systems  Constitutional:  Positive for chills, fatigue and fever.  HENT:  Positive for congestion.   Respiratory:  Positive for cough. Negative for shortness of breath.   Gastrointestinal:  Positive for nausea and vomiting.   Physical Exam   Blood pressure 125/74, pulse (!) 107, temperature 98.9 F (37.2 C), temperature source Oral, resp. rate 16, height 5\' 3"  (1.6 m), weight (!) 148.5 kg, last menstrual period 05/13/2022, SpO2 100 %.  Physical Exam Vitals and nursing note reviewed.  Constitutional:      Appearance: Normal appearance. She is ill-appearing.  HENT:     Head: Normocephalic.  Pulmonary:     Effort: Pulmonary effort is normal.     Breath sounds: Normal breath sounds.  Musculoskeletal:        General: Normal range of motion.     Cervical back: Normal range of motion.  Skin:    General: Skin is warm and dry.     Capillary Refill: Capillary refill takes less than 2 seconds.  Neurological:     Mental Status: She is alert and oriented to person, place, and time.  Psychiatric:        Mood and Affect: Mood normal.    FHT obtained in triage.   MAU Course  Procedures Orders Placed This Encounter  Procedures   Resp panel by RT-PCR (RSV, Flu A&B, Covid) Anterior Nasal Swab   Urinalysis, Routine w reflex microscopic Urine, Clean Catch   Discharge patient   Results for orders placed or performed during the hospital encounter of 09/01/22 (from the past 24 hour(s))  Resp panel by RT-PCR (RSV, Flu A&B, Covid) Anterior Nasal Swab     Status: Abnormal   Collection Time: 09/01/22  6:11 PM   Specimen: Anterior Nasal Swab  Result Value Ref Range   SARS Coronavirus 2 by RT PCR NEGATIVE NEGATIVE   Influenza A by PCR POSITIVE (A) NEGATIVE   Influenza B by PCR NEGATIVE NEGATIVE   Resp Syncytial Virus by PCR NEGATIVE NEGATIVE  Urinalysis, Routine w reflex  microscopic Urine, Clean Catch     Status: Abnormal  Collection Time: 09/01/22  6:22 PM  Result Value Ref Range   Color, Urine YELLOW YELLOW   APPearance CLOUDY (A) CLEAR   Specific Gravity, Urine 1.014 1.005 - 1.030   pH 7.0 5.0 - 8.0   Glucose, UA NEGATIVE NEGATIVE mg/dL   Hgb urine dipstick NEGATIVE NEGATIVE   Bilirubin Urine NEGATIVE NEGATIVE   Ketones, ur 5 (A) NEGATIVE mg/dL   Protein, ur NEGATIVE NEGATIVE mg/dL   Nitrite NEGATIVE NEGATIVE   Leukocytes,Ua NEGATIVE NEGATIVE    MDM - Afebrile in MAU - Positive for Flu A  - UA cloudy and only 5 of ketones. Low suspicion for dehydration.  - Plan to discharge home with Tamiflu.   Assessment and Plan   1. Flu   2. [redacted] weeks gestation of pregnancy   3. Acute cough   4. Chest congestion    - Reviewed flu results with patient. Recommended increased rest and fluid intake. Patient request for nausea medication.  - Rx for Zofran sent to outpatient pharmacy.  - Recommended Mucinex for decongestion.  - Worsening signs and return precautions reviewed with patient.  - Patient discharged home in stable condition and may return to MAU as needed.   Jacquiline Doe, MSN CNM  09/01/2022, 7:14 PM

## 2022-09-01 NOTE — MAU Note (Signed)
Traci Castro is a 27 y.o. at [redacted]w[redacted]d here in MAU reporting: cough, headaches, 2 episodes of emesis, and DFM for the past 2 days. States mother was recently dx with the flu.  Onset of complaint: ongoing  Pain score: 0/10  Vitals:   09/01/22 1805  BP: 125/74  Pulse: (!) 107  Resp: 16  Temp: 98.9 F (37.2 C)  SpO2: 100%     FHT:156  Lab orders placed from triage: UA, covid swab

## 2022-09-04 ENCOUNTER — Encounter: Payer: Medicaid Other | Admitting: Obstetrics

## 2022-09-11 ENCOUNTER — Encounter: Payer: Medicaid Other | Admitting: Obstetrics

## 2022-09-15 ENCOUNTER — Inpatient Hospital Stay (HOSPITAL_COMMUNITY)
Admission: AD | Admit: 2022-09-15 | Discharge: 2022-09-15 | Disposition: A | Discharge status: Home / Self Care | Payer: Medicaid Other | Attending: Family Medicine | Admitting: Family Medicine

## 2022-09-15 ENCOUNTER — Encounter (HOSPITAL_COMMUNITY): Payer: Self-pay | Admitting: Family Medicine

## 2022-09-15 DIAGNOSIS — O26892 Other specified pregnancy related conditions, second trimester: Secondary | ICD-10-CM | POA: Insufficient documentation

## 2022-09-15 DIAGNOSIS — R519 Headache, unspecified: Secondary | ICD-10-CM | POA: Insufficient documentation

## 2022-09-15 DIAGNOSIS — Z3A17 17 weeks gestation of pregnancy: Secondary | ICD-10-CM | POA: Insufficient documentation

## 2022-09-15 DIAGNOSIS — O99612 Diseases of the digestive system complicating pregnancy, second trimester: Secondary | ICD-10-CM | POA: Insufficient documentation

## 2022-09-15 DIAGNOSIS — K0889 Other specified disorders of teeth and supporting structures: Secondary | ICD-10-CM | POA: Diagnosis not present

## 2022-09-15 LAB — URINALYSIS, ROUTINE W REFLEX MICROSCOPIC
Bilirubin Urine: NEGATIVE
Glucose, UA: NEGATIVE mg/dL
Hgb urine dipstick: NEGATIVE
Ketones, ur: NEGATIVE mg/dL
Leukocytes,Ua: NEGATIVE
Nitrite: NEGATIVE
Protein, ur: NEGATIVE mg/dL
Specific Gravity, Urine: 1.021 (ref 1.005–1.030)
pH: 6 (ref 5.0–8.0)

## 2022-09-15 MED ORDER — OXYCODONE-ACETAMINOPHEN 5-325 MG PO TABS
2.0000 | ORAL_TABLET | Freq: Once | ORAL | Status: AC
  Administered 2022-09-15: 2 via ORAL
  Filled 2022-09-15: qty 2

## 2022-09-15 MED ORDER — OXYCODONE-ACETAMINOPHEN 5-325 MG PO TABS: 1.0000 | ORAL_TABLET | Freq: Four times a day (QID) | ORAL | 0 refills | Status: AC | PRN

## 2022-09-15 NOTE — MAU Provider Note (Signed)
History     [id number]  Arrival date and time: 09/15/22 1507    Chief Complaint  Patient presents with   Dental Pain   Headache     HPI Traci Castro is a 27 y.o. at [redacted]w[redacted]d who presents for headache & dental pain. States she broke her tooth a few months ago. Pain worsened this week and now noticed a "hole" in her filling in bottom molar. Has been taking tylenol without relief. Has had constant headache for the last week that radiates from her tooth pain. Called emergency dentist & was directed to come to MAU. No OB complaints.   OB History     Gravida  5   Para  3   Term  2   Preterm  1   AB  1   Living  3      SAB      IAB  1   Ectopic      Multiple  0   Live Births  3           Past Medical History:  Diagnosis Date   Anemia    Anxiety    no meds   Bipolar disorder ([place])    Blood transfusion without reported diagnosis    for pp anemia not PPH   Depression    no meds   Gastritis    2 bouts   H/O seasonal allergies    Migraine    migraines   Psoriasis    Vision abnormalities    wears glasses    Past Surgical History:  Procedure Laterality Date   DILATION AND CURETTAGE OF UTERUS     DILATION AND EVACUATION N/A 09/20/2017   Procedure: SUCTION DILATATION AND EVACUATION;  Surgeon: Aletha Halim, MD;  Location: [facility];  Service: Gynecology;  Laterality: N/A;   ESOPHAGOGASTRODUODENOSCOPY N/A 10/17/2012   Procedure: ESOPHAGOGASTRODUODENOSCOPY (EGD);  Surgeon: Oletha Blend, MD;  Location: [facility];  Service: Gastroenterology;  Laterality: N/A;   THERAPEUTIC ABORTION  09/16/2017   Clinic   WISDOM TOOTH EXTRACTION      Family History  Problem Relation Age of Onset   Cholelithiasis Mother    Hyperlipidemia Mother    Hypertension Mother    Mental illness Mother    Thyroid disease Mother    Cholelithiasis Maternal Uncle    Hyperlipidemia Maternal Uncle    Hypertension Maternal Uncle    Cholelithiasis Maternal Grandmother    Arthritis  Maternal Grandmother    COPD Maternal Grandmother    Diabetes Maternal Grandmother    Heart disease Maternal Grandmother    Hyperlipidemia Maternal Grandmother    Hypertension Maternal Grandmother    Stroke Maternal Grandmother    Learning disabilities Paternal Grandmother    Mental illness Paternal Grandmother    Cancer Paternal Grandmother    Hyperlipidemia Paternal Grandfather    Diabetes Paternal Grandfather    Depression Father    Hypertension Father    Ulcers Neg Hx    Alcohol abuse Neg Hx    Asthma Neg Hx    Birth defects Neg Hx    Drug abuse Neg Hx    Early death Neg Hx    Hearing loss Neg Hx    Kidney disease Neg Hx    Mental retardation Neg Hx    Miscarriages / Stillbirths Neg Hx    Vision loss Neg Hx     Allergies  Allergen Reactions   Chlorhexidine Itching and Rash    Severe itching, redness.  Tramadol Nausea And Vomiting    No current facility-administered medications on file prior to encounter.   Current Outpatient Medications on File Prior to Encounter  Medication Sig Dispense Refill   Blood Pressure Monitoring (BLOOD PRESSURE KIT) DEVI 1 Device by Does not apply route once a week. 1 each 0   metoCLOPramide (REGLAN) 10 MG tablet Take 1 tablet (10 mg total) by mouth every 8 (eight) hours as needed. 30 tablet 0   ondansetron (ZOFRAN) 4 MG tablet Take 1 tablet (4 mg total) by mouth every 8 (eight) hours as needed for nausea or vomiting. 20 tablet 0   oseltamivir (TAMIFLU) 75 MG capsule Take 1 capsule (75 mg total) by mouth every 12 (twelve) hours. 10 capsule 0   Prenatal Vit-Fe Fumarate-FA (PRENATAL VITAMIN PO) Take 1 tablet by mouth daily.     promethazine (PHENERGAN) 25 MG tablet Take 1 tablet (25 mg total) by mouth every 6 (six) hours as needed for nausea or vomiting. 30 tablet 1     ROS Pertinent positives and negative per HPI, all others reviewed and negative  Physical Exam   BP 137/79 (BP Location: Right Arm)   Pulse (!) 105   Temp 99 F (37.2  C) (Oral)   Resp 17   Ht 5\' 3"  (1.6 m)   Wt (!) 149.4 kg   LMP 05/13/2022   SpO2 99%   BMI 58.33 kg/m   Patient Vitals for the past 24 hrs:  BP Temp Temp src Pulse Resp SpO2 Height Weight  09/15/22 1544 137/79 99 F (37.2 C) Oral (!) 105 17 99 % 5\' 3"  (1.6 m) (!) 149.4 kg    Physical Exam Vitals and nursing note reviewed.  Constitutional:      General: She is not in acute distress.    Appearance: She is well-developed. She is not ill-appearing.  HENT:     Head: Normocephalic and atraumatic.     Mouth/Throat:     Dentition: Dental tenderness present. No gingival swelling or dental abscesses.     Comments: Tooth 2 broken. Tooth 31 with broken filling.  Pulmonary:     Effort: Pulmonary effort is normal. No respiratory distress.  Skin:    General: Skin is warm and dry.  Neurological:     Mental Status: She is alert.  Psychiatric:        Mood and Affect: Mood normal.        Behavior: Behavior normal.       Labs Results for orders placed or performed during the hospital encounter of 09/15/22 (from the past 24 hour(s))  Urinalysis, Routine w reflex microscopic Urine, Clean Catch     Status: Abnormal   Collection Time: 09/15/22  4:21 PM  Result Value Ref Range   Color, Urine AMBER (A) YELLOW   APPearance CLOUDY (A) CLEAR   Specific Gravity, Urine 1.021 1.005 - 1.030   pH 6.0 5.0 - 8.0   Glucose, UA NEGATIVE NEGATIVE mg/dL   Hgb urine dipstick NEGATIVE NEGATIVE   Bilirubin Urine NEGATIVE NEGATIVE   Ketones, ur NEGATIVE NEGATIVE mg/dL   Protein, ur NEGATIVE NEGATIVE mg/dL   Nitrite NEGATIVE NEGATIVE   Leukocytes,Ua NEGATIVE NEGATIVE    Imaging No results found.  MAU Course  Procedures Lab Orders         Urinalysis, Routine w reflex microscopic Urine, Clean Catch    Meds ordered this encounter  Medications   oxyCODONE-acetaminophen (PERCOCET/ROXICET) 5-325 MG per tablet 2 tablet   oxyCODONE-acetaminophen (PERCOCET/ROXICET) 5-325 MG tablet  Sig: Take 1-2  tablets by mouth every 6 (six) hours as needed for up to 5 days for severe pain.    Dispense:  30 tablet    Refill:  0    Order Specific Question:   Supervising Provider    Answer:   Reva Bores [[id number]]   Imaging Orders  No imaging studies ordered today    MDM FHT present via doppler  Given perocet in MAU with improvement in dental pain.   Assessment and Plan   1. Pain, dental   2. [redacted] weeks gestation of pregnancy    -Rx percocet -F/u with emergency dentist this weekend  Judeth Horn, NP 09/15/22 7:01 PM

## 2022-09-15 NOTE — MAU Note (Signed)
Called Main Lab to request UA to be ran.

## 2022-09-15 NOTE — MAU Note (Signed)
...  Traci Castro is a 27 y.o. at [redacted]w[redacted]d here in MAU reporting: Tooth pain, decreased flutters, headaches, and dark urine. She reports her back molar on the top right of her mouth broke two months ago and one month ago began hurting. Patient reports she has been taking Tylenol, using peroxide, and applying orajel. She reports she has had a constant headache for one month now and has gone through one bottle of Tylenol. She reports her tooth pain has affected her ability to eat, drink, and talk. Reports dark colored urine.   She reports she called the emergency dentist and they told her they did not have any appointments available until March to come to MAU since she was pregnant.  Onset of complaint: x1 month ago Pain score:  10/10 2nd right upper molar 9/10 HA   FHT: 155 doppler Lab orders placed from triage:  UA

## 2022-09-15 NOTE — Discharge Instructions (Signed)
Emergency Dentists    [facility] [address], [address] [place], [place] [address] [phone]   [facility] [place] [facility] Available [address] [place], [place] [address] Call EDS 24/7 Office Number: [phone]   [facility] [address], [place], [place] [address], [place] [phone]  

## 2022-09-16 ENCOUNTER — Inpatient Hospital Stay (HOSPITAL_COMMUNITY)
Admission: AD | Admit: 2022-09-16 | Discharge: 2022-09-16 | Disposition: A | Discharge status: Home / Self Care | Payer: Medicaid Other | Attending: Family Medicine | Admitting: Family Medicine

## 2022-09-16 ENCOUNTER — Encounter (HOSPITAL_COMMUNITY): Payer: Self-pay | Admitting: Family Medicine

## 2022-09-16 ENCOUNTER — Other Ambulatory Visit: Payer: Self-pay

## 2022-09-16 DIAGNOSIS — E860 Dehydration: Secondary | ICD-10-CM | POA: Diagnosis not present

## 2022-09-16 DIAGNOSIS — G44229 Chronic tension-type headache, not intractable: Secondary | ICD-10-CM | POA: Diagnosis not present

## 2022-09-16 DIAGNOSIS — O99352 Diseases of the nervous system complicating pregnancy, second trimester: Secondary | ICD-10-CM | POA: Diagnosis not present

## 2022-09-16 DIAGNOSIS — Z3A17 17 weeks gestation of pregnancy: Secondary | ICD-10-CM | POA: Diagnosis not present

## 2022-09-16 DIAGNOSIS — K0889 Other specified disorders of teeth and supporting structures: Secondary | ICD-10-CM | POA: Insufficient documentation

## 2022-09-16 DIAGNOSIS — R519 Headache, unspecified: Secondary | ICD-10-CM | POA: Diagnosis present

## 2022-09-16 DIAGNOSIS — O99612 Diseases of the digestive system complicating pregnancy, second trimester: Secondary | ICD-10-CM | POA: Insufficient documentation

## 2022-09-16 DIAGNOSIS — O99282 Endocrine, nutritional and metabolic diseases complicating pregnancy, second trimester: Secondary | ICD-10-CM | POA: Insufficient documentation

## 2022-09-16 LAB — BASIC METABOLIC PANEL
Anion gap: 11 (ref 5–15)
BUN: 5 mg/dL — ABNORMAL LOW (ref 6–20)
CO2: 22 mmol/L (ref 22–32)
Calcium: 8.9 mg/dL (ref 8.9–10.3)
Chloride: 102 mmol/L (ref 98–111)
Creatinine, Ser: 0.65 mg/dL (ref 0.44–1.00)
GFR, Estimated: >60 mL/min (ref >60)
Glucose, Bld: 90 mg/dL (ref 70–99)
Potassium: 3.8 mmol/L (ref 3.5–5.1)
Sodium: 135 mmol/L (ref 135–145)

## 2022-09-16 LAB — URINALYSIS, ROUTINE W REFLEX MICROSCOPIC
Bilirubin Urine: NEGATIVE
Glucose, UA: NEGATIVE mg/dL
Hgb urine dipstick: NEGATIVE
Ketones, ur: NEGATIVE mg/dL
Nitrite: NEGATIVE
Protein, ur: 30 mg/dL — AB
Specific Gravity, Urine: 1.024 (ref 1.005–1.030)
Squamous Epithelial / HPF: >50 /HPF — ABNORMAL HIGH (ref 0–5)
pH: 6 (ref 5.0–8.0)

## 2022-09-16 LAB — CBC
HCT: 37.7 % (ref 36.0–46.0)
Hemoglobin: 11.8 g/dL — ABNORMAL LOW (ref 12.0–15.0)
MCH: 25.4 pg — ABNORMAL LOW (ref 26.0–34.0)
MCHC: 31.3 g/dL (ref 30.0–36.0)
MCV: 81.3 fL (ref 80.0–100.0)
Platelets: 355 10*3/uL (ref 150–400)
RBC: 4.64 MIL/uL (ref 3.87–5.11)
RDW: 15.9 % — ABNORMAL HIGH (ref 11.5–15.5)
WBC: 10.6 10*3/uL — ABNORMAL HIGH (ref 4.0–10.5)
nRBC: 0 % (ref 0.0–0.2)

## 2022-09-16 MED ORDER — LACTATED RINGERS IV BOLUS
1000.0000 mL | Freq: Once | INTRAVENOUS | Status: AC
  Administered 2022-09-16: 1000 mL via INTRAVENOUS

## 2022-09-16 MED ORDER — DIPHENHYDRAMINE HCL 50 MG/ML IJ SOLN
25.0000 mg | Freq: Once | INTRAMUSCULAR | Status: AC
  Administered 2022-09-16: 25 mg via INTRAVENOUS
  Filled 2022-09-16: qty 1

## 2022-09-16 MED ORDER — DIPHENHYDRAMINE HCL 25 MG PO TABS: 25.0000 mg | ORAL_TABLET | Freq: Four times a day (QID) | ORAL | 0 refills | Status: AC | PRN

## 2022-09-16 MED ORDER — DEXAMETHASONE SODIUM PHOSPHATE 10 MG/ML IJ SOLN
10.0000 mg | Freq: Once | INTRAMUSCULAR | Status: AC
  Administered 2022-09-16: 10 mg via INTRAVENOUS
  Filled 2022-09-16: qty 1

## 2022-09-16 MED ORDER — METOCLOPRAMIDE HCL 10 MG PO TABS: 10.0000 mg | ORAL_TABLET | Freq: Three times a day (TID) | ORAL | 0 refills | Status: DC | PRN

## 2022-09-16 MED ORDER — METOCLOPRAMIDE HCL 5 MG/ML IJ SOLN
10.0000 mg | Freq: Once | INTRAMUSCULAR | Status: AC
  Administered 2022-09-16: 10 mg via INTRAVENOUS
  Filled 2022-09-16: qty 2

## 2022-09-16 NOTE — MAU Provider Note (Signed)
History     CSN: [id number]  Arrival date and time: 09/16/22 1446   Event Date/Time   First Provider Initiated Contact with Patient 09/16/22 1653      Chief Complaint  Patient presents with   Headache   27 y.o. [id number] @17 .1 wks with hx of migraines presenting with HA. HA started about 1 month ago. Reports tooth pain that started 2 weeks ago and HA became worse. HA is temporal and frontal. She was seen yesterday in MAU for tooth pain and given Percocet which helped her tooth but not her HA. Rates pain 10/10. Endorses blurry vision and nausea. No pregnancy concerns. She was scheduled to see a dentist today but didn't go due to the pain.     OB History     Gravida  5   Para  3   Term  2   Preterm  1   AB  1   Living  3      SAB      IAB  1   Ectopic      Multiple  0   Live Births  3           Past Medical History:  Diagnosis Date   Anemia    Anxiety    no meds   Bipolar disorder ([facility])    Blood transfusion without reported diagnosis    for pp anemia not PPH   Depression    no meds   Gastritis    2 bouts   H/O seasonal allergies    Migraine    migraines   Psoriasis    Vision abnormalities    wears glasses    Past Surgical History:  Procedure Laterality Date   DILATION AND CURETTAGE OF UTERUS     DILATION AND EVACUATION N/A 09/20/2017   Procedure: SUCTION DILATATION AND EVACUATION;  Surgeon: 09/22/2017, MD;  Location: [facility];  Service: Gynecology;  Laterality: N/A;   ESOPHAGOGASTRODUODENOSCOPY N/A 10/17/2012   Procedure: ESOPHAGOGASTRODUODENOSCOPY (EGD);  Surgeon: 10/19/2012, MD;  Location: [facility];  Service: Gastroenterology;  Laterality: N/A;   THERAPEUTIC ABORTION  09/16/2017   Clinic   WISDOM TOOTH EXTRACTION      Family History  Problem Relation Age of Onset   Cholelithiasis Mother    Hyperlipidemia Mother    Hypertension Mother    Mental illness Mother    Thyroid disease Mother    Cholelithiasis Maternal Uncle     Hyperlipidemia Maternal Uncle    Hypertension Maternal Uncle    Cholelithiasis Maternal Grandmother    Arthritis Maternal Grandmother    COPD Maternal Grandmother    Diabetes Maternal Grandmother    Heart disease Maternal Grandmother    Hyperlipidemia Maternal Grandmother    Hypertension Maternal Grandmother    Stroke Maternal Grandmother    Learning disabilities Paternal Grandmother    Mental illness Paternal Grandmother    Cancer Paternal Grandmother    Hyperlipidemia Paternal Grandfather    Diabetes Paternal Grandfather    Depression Father    Hypertension Father    Ulcers Neg Hx    Alcohol abuse Neg Hx    Asthma Neg Hx    Birth defects Neg Hx    Drug abuse Neg Hx    Early death Neg Hx    Hearing loss Neg Hx    Kidney disease Neg Hx    Mental retardation Neg Hx    Miscarriages / Stillbirths Neg Hx    Vision loss Neg Hx  Social History   Tobacco Use   Smoking status: Never   Smokeless tobacco: Never  Vaping Use   Vaping Use: Never used  Substance Use Topics   Alcohol use: No   Drug use: No    Allergies:  Allergies  Allergen Reactions   Chlorhexidine Itching and Rash    Severe itching, redness.    Tramadol Nausea And Vomiting    Medications Prior to Admission  Medication Sig Dispense Refill Last Dose   ondansetron (ZOFRAN) 4 MG tablet Take 1 tablet (4 mg total) by mouth every 8 (eight) hours as needed for nausea or vomiting. 20 tablet 0 09/16/2022   oxyCODONE-acetaminophen (PERCOCET/ROXICET) 5-325 MG tablet Take 1-2 tablets by mouth every 6 (six) hours as needed for up to 5 days for severe pain. 30 tablet 0 09/16/2022   Prenatal Vit-Fe Fumarate-FA (PRENATAL VITAMIN PO) Take 1 tablet by mouth daily.   09/16/2022   Blood Pressure Monitoring (BLOOD PRESSURE KIT) DEVI 1 Device by Does not apply route once a week. 1 each 0 Unknown   metoCLOPramide (REGLAN) 10 MG tablet Take 1 tablet (10 mg total) by mouth every 8 (eight) hours as needed. 30 tablet 0 Unknown    oseltamivir (TAMIFLU) 75 MG capsule Take 1 capsule (75 mg total) by mouth every 12 (twelve) hours. 10 capsule 0 Unknown   promethazine (PHENERGAN) 25 MG tablet Take 1 tablet (25 mg total) by mouth every 6 (six) hours as needed for nausea or vomiting. 30 tablet 1 Unknown    Review of Systems  Constitutional:  Negative for chills and fever.  Eyes:  Positive for photophobia and visual disturbance.  Gastrointestinal:  Positive for nausea. Negative for abdominal pain and vomiting.  Genitourinary:  Negative for vaginal bleeding.  Neurological:  Positive for headaches. Negative for dizziness.   Physical Exam   Blood pressure 114/63, pulse 97, temperature 98.5 F (36.9 C), temperature source Oral, resp. rate 20, height 5\' 3"  (1.6 m), weight (!) 148.6 kg, last menstrual period 05/13/2022, SpO2 100 %.  Physical Exam Vitals and nursing note reviewed.  Constitutional:      General: She is not in acute distress.    Appearance: Normal appearance.  HENT:     Head: Normocephalic and atraumatic.  Eyes:     Extraocular Movements: Extraocular movements intact.     Pupils: Pupils are equal, round, and reactive to light.  Cardiovascular:     Rate and Rhythm: Normal rate.  Pulmonary:     Effort: Pulmonary effort is normal. No respiratory distress.  Musculoskeletal:        General: Normal range of motion.     Cervical back: Normal range of motion.  Skin:    General: Skin is warm and dry.  Neurological:     General: No focal deficit present.     Mental Status: She is alert and oriented to person, place, and time.     Cranial Nerves: No cranial nerve deficit.     Sensory: No sensory deficit.  Psychiatric:        Mood and Affect: Mood normal.        Behavior: Behavior normal.   FHT 150  Results for orders placed or performed during the hospital encounter of 09/16/22 (from the past 24 hour(s))  Urinalysis, Routine w reflex microscopic Urine, Clean Catch     Status: Abnormal   Collection Time:  09/16/22  3:33 PM  Result Value Ref Range   Color, Urine AMBER (A) YELLOW   APPearance CLOUDY (  A) CLEAR   Specific Gravity, Urine 1.024 1.005 - 1.030   pH 6.0 5.0 - 8.0   Glucose, UA NEGATIVE NEGATIVE mg/dL   Hgb urine dipstick NEGATIVE NEGATIVE   Bilirubin Urine NEGATIVE NEGATIVE   Ketones, ur NEGATIVE NEGATIVE mg/dL   Protein, ur 30 (A) NEGATIVE mg/dL   Nitrite NEGATIVE NEGATIVE   Leukocytes,Ua SMALL (A) NEGATIVE   RBC / HPF 0-5 0 - 5 RBC/hpf   WBC, UA 6-10 0 - 5 WBC/hpf   Bacteria, UA RARE (A) NONE SEEN   Squamous Epithelial / HPF >50 (H) 0 - 5 /HPF   Mucus PRESENT   CBC     Status: Abnormal   Collection Time: 09/16/22  5:22 PM  Result Value Ref Range   WBC 10.6 (H) 4.0 - 10.5 K/uL   RBC 4.64 3.87 - 5.11 MIL/uL   Hemoglobin 11.8 (L) 12.0 - 15.0 g/dL   HCT 62.9 52.8 - 41.3 %   MCV 81.3 80.0 - 100.0 fL   MCH 25.4 (L) 26.0 - 34.0 pg   MCHC 31.3 30.0 - 36.0 g/dL   RDW 24.4 (H) 01.0 - 27.2 %   Platelets 355 150 - 400 K/uL   nRBC 0.0 0.0 - 0.2 %  Basic metabolic panel     Status: Abnormal   Collection Time: 09/16/22  5:22 PM  Result Value Ref Range   Sodium 135 135 - 145 mmol/L   Potassium 3.8 3.5 - 5.1 mmol/L   Chloride 102 98 - 111 mmol/L   CO2 22 22 - 32 mmol/L   Glucose, Bld 90 70 - 99 mg/dL   BUN 5 (L) 6 - 20 mg/dL   Creatinine, Ser 5.36 0.44 - 1.00 mg/dL   Calcium 8.9 8.9 - 64.4 mg/dL   GFR, Estimated >03 >47 mL/min   Anion gap 11 5 - 15    MAU Course  Procedures LR Reglan Benadryl Decadron  MDM Labs ordered and reviewed. Normal neuro exam. HA resolved after meds. Recommend f/u with dentist. Recommend increased water intake. Stable for discharge.  Assessment and Plan   1. [redacted] weeks gestation of pregnancy   2. Chronic tension-type headache, not intractable   3. Dehydration    Discharge home Follow up at [facility] as scheduled Rx Reglan & Benadryl Return precautions  Allergies as of 09/16/2022       Reactions   Chlorhexidine Itching, Rash   Severe  itching, redness.    Tramadol Nausea And Vomiting        Medication List     STOP taking these medications    oseltamivir 75 MG capsule Commonly known as: TAMIFLU       TAKE these medications    Blood Pressure Kit Devi 1 Device by Does not apply route once a week.   diphenhydrAMINE 25 MG tablet Commonly known as: BENADRYL Take 1 tablet (25 mg total) by mouth every 6 (six) hours as needed.   metoCLOPramide 10 MG tablet Commonly known as: REGLAN Take 1 tablet (10 mg total) by mouth every 8 (eight) hours as needed.   ondansetron 4 MG tablet Commonly known as: Zofran Take 1 tablet (4 mg total) by mouth every 8 (eight) hours as needed for nausea or vomiting.   oxyCODONE-acetaminophen 5-325 MG tablet Commonly known as: PERCOCET/ROXICET Take 1-2 tablets by mouth every 6 (six) hours as needed for up to 5 days for severe pain.   PRENATAL VITAMIN PO Take 1 tablet by mouth daily.   promethazine 25 MG tablet  Commonly known as: PHENERGAN Take 1 tablet (25 mg total) by mouth every 6 (six) hours as needed for nausea or vomiting.        Julianne Handler, CNM 09/16/2022, 5:06 PM

## 2022-09-16 NOTE — MAU Note (Signed)
.  Traci Castro is a 27 y.o. at [redacted]w[redacted]d here in MAU reporting:  ongoing headache for the past month.  Pt reports hx of migraines.  States she was seen in [facility] yesterday for tooth pain and headache, but that pain med for tooth pain is not helping headache.  Last took her pain meds at 0530 this morning.  Denies abd pain or bleeding   Onset of complaint: 1/20 Pain score: 10 Vitals:   09/16/22 1535 09/16/22 1537  BP:  128/82  Pulse:  (!) 102  Resp: 16   Temp: 98.5 F (36.9 C)   SpO2: 100%      FHT:15 Lab orders placed from triage:   ua

## 2022-09-18 ENCOUNTER — Encounter: Payer: Medicaid Other | Admitting: Obstetrics

## 2022-09-19 ENCOUNTER — Ambulatory Visit (INDEPENDENT_AMBULATORY_CARE_PROVIDER_SITE_OTHER): Payer: Medicaid Other | Admitting: Obstetrics and Gynecology

## 2022-09-19 ENCOUNTER — Encounter: Payer: Self-pay | Admitting: Obstetrics and Gynecology

## 2022-09-19 ENCOUNTER — Other Ambulatory Visit (HOSPITAL_COMMUNITY)
Admission: RE | Admit: 2022-09-19 | Discharge: 2022-09-19 | Disposition: A | Discharge status: Home / Self Care | Payer: Medicaid Other | Source: Ambulatory Visit | Attending: Obstetrics | Admitting: Obstetrics

## 2022-09-19 VITALS — BP 126/83 | HR 102 | Wt 325.0 lb

## 2022-09-19 DIAGNOSIS — Z3482 Encounter for supervision of other normal pregnancy, second trimester: Secondary | ICD-10-CM | POA: Diagnosis not present

## 2022-09-19 DIAGNOSIS — Z3480 Encounter for supervision of other normal pregnancy, unspecified trimester: Secondary | ICD-10-CM

## 2022-09-19 DIAGNOSIS — K0889 Other specified disorders of teeth and supporting structures: Secondary | ICD-10-CM

## 2022-09-19 DIAGNOSIS — Z3A17 17 weeks gestation of pregnancy: Secondary | ICD-10-CM

## 2022-09-19 HISTORY — DX: Other specified disorders of teeth and supporting structures: K08.89

## 2022-09-19 NOTE — Progress Notes (Signed)
Subjective:  Traci Castro is a 27 y.o. [phone] at [redacted]w[redacted]d being seen today for her first OB appt. EDD by first trimester U/S. Denies any chronic medical problems or medications.oShe is currently monitored for the following issues for this low-risk pregnancy and has BMI 45.0-49.9, adult ([place]); [name]; Migraine headache; [name], antepartum; and Pain, dental on their problem list.  Patient reports no complaints.  Contractions: Not present. Vag. Bleeding: None.  Movement: Present. Denies leaking of fluid.   The following portions of the patient's history were reviewed and updated as appropriate: allergies, current medications, past family history, past medical history, past social history, past surgical history and problem list. Problem list updated.  Objective:   Vitals:   09/19/22 1023  BP: 126/83  Pulse: (!) 102  Weight: (!) 325 lb (147.4 kg)    Fetal Status: Fetal Heart Rate (bpm): 150   Movement: Present     General:  Alert, oriented and cooperative. Patient is in no acute distress.  Skin: Skin is warm and dry. No rash noted.   Cardiovascular: Normal heart rate noted  Respiratory: Normal respiratory effort, no problems with respiration noted  Abdomen: Soft, gravid, appropriate for gestational age. Pain/Pressure: Absent     Pelvic:  Cervical exam performed        Extremities: Normal range of motion.     Mental Status: Normal mood and affect. Normal behavior. Normal judgment and thought content.   Urinalysis:      Assessment and Plan:  Pregnancy: [id number] at [redacted]w[redacted]d  1. [name], antepartum Prenatal labs and care reviewed with pt Genetic testing discussed - CBC/D/Plt+RPR+Rh+ABO+RubIgG... - Cervicovaginal ancillary only( [place]) - Culture, OB Urine - Hemoglobin A1c - AFP, Serum, Open Spina Bifida  2. Pain, dental Referral to Dental and dental letter provided  Preterm labor symptoms and general  obstetric precautions including but not limited to vaginal bleeding, contractions, leaking of fluid and fetal movement were reviewed in detail with the patient. Please refer to After Visit Summary for other counseling recommendations.  Return in about 4 weeks (around 10/17/2022) for OB visit, CNM to discuss waterbirth.   Chancy Milroy, MD

## 2022-09-19 NOTE — Patient Instructions (Signed)

## 2022-09-20 ENCOUNTER — Other Ambulatory Visit: Payer: Medicaid Other

## 2022-09-20 LAB — CBC/D/PLT+RPR+RH+ABO+RUBIGG...
Antibody Screen: NEGATIVE
Basophils Absolute: 0 10*3/uL (ref 0.0–0.2)
Basos: 0 %
EOS (ABSOLUTE): 0.1 10*3/uL (ref 0.0–0.4)
Eos: 1 %
HCV Ab: NONREACTIVE
HIV Screen 4th Generation wRfx: NONREACTIVE
Hematocrit: 38.5 % (ref 34.0–46.6)
Hemoglobin: 12.3 g/dL (ref 11.1–15.9)
Hepatitis B Surface Ag: NEGATIVE
Immature Grans (Abs): 0.1 10*3/uL (ref 0.0–0.1)
Immature Granulocytes: 1 %
Lymphocytes Absolute: 2.4 10*3/uL (ref 0.7–3.1)
Lymphs: 20 %
MCH: 25.4 pg — ABNORMAL LOW (ref 26.6–33.0)
MCHC: 31.9 g/dL (ref 31.5–35.7)
MCV: 80 fL (ref 79–97)
Monocytes Absolute: 0.7 10*3/uL (ref 0.1–0.9)
Monocytes: 6 %
Neutrophils Absolute: 8.9 10*3/uL — ABNORMAL HIGH (ref 1.4–7.0)
Neutrophils: 72 %
Platelets: 361 10*3/uL (ref 150–450)
RBC: 4.84 x10E6/uL (ref 3.77–5.28)
RDW: 15.9 % — ABNORMAL HIGH (ref 11.7–15.4)
RPR Ser Ql: NONREACTIVE
Rh Factor: POSITIVE
Rubella Antibodies, IGG: 1.34 index (ref >0.99)
WBC: 12.2 10*3/uL — ABNORMAL HIGH (ref 3.4–10.8)

## 2022-09-20 LAB — HEMOGLOBIN A1C
Est. average glucose Bld gHb Est-mCnc: 120 mg/dL
Hgb A1c MFr Bld: 5.8 % — ABNORMAL HIGH (ref 4.8–5.6)

## 2022-09-20 LAB — CERVICOVAGINAL ANCILLARY ONLY
Chlamydia: NEGATIVE
Comment: NEGATIVE
Comment: NEGATIVE
Comment: NORMAL
Neisseria Gonorrhea: NEGATIVE
Trichomonas: NEGATIVE

## 2022-09-20 LAB — HCV INTERPRETATION

## 2022-09-21 LAB — URINE CULTURE, OB REFLEX

## 2022-09-21 LAB — CULTURE, OB URINE

## 2022-09-22 LAB — AFP, SERUM, OPEN SPINA BIFIDA
AFP MoM: 1.92
AFP Value: 56.3 ng/mL
Gest. Age on Collection Date: 17.5 weeks
Maternal Age At EDD: 26.6 yr
OSBR Risk 1 IN: 1930
Test Results:: NEGATIVE
Weight: 325 [lb_av]

## 2022-09-26 LAB — PANORAMA PRENATAL TEST FULL PANEL:PANORAMA TEST PLUS 5 ADDITIONAL MICRODELETIONS: FETAL FRACTION: 11.9

## 2022-10-01 ENCOUNTER — Ambulatory Visit: Payer: Medicaid Other | Admitting: *Deleted

## 2022-10-01 ENCOUNTER — Ambulatory Visit: Payer: Medicaid Other | Attending: Obstetrics and Gynecology

## 2022-10-01 ENCOUNTER — Other Ambulatory Visit: Payer: Self-pay | Admitting: *Deleted

## 2022-10-01 ENCOUNTER — Encounter: Payer: Self-pay | Admitting: *Deleted

## 2022-10-01 VITALS — BP 132/71 | HR 126

## 2022-10-01 DIAGNOSIS — O99212 Obesity complicating pregnancy, second trimester: Secondary | ICD-10-CM | POA: Diagnosis present

## 2022-10-01 DIAGNOSIS — Z3480 Encounter for supervision of other normal pregnancy, unspecified trimester: Secondary | ICD-10-CM

## 2022-10-01 DIAGNOSIS — Z362 Encounter for other antenatal screening follow-up: Secondary | ICD-10-CM

## 2022-10-01 DIAGNOSIS — Z148 Genetic carrier of other disease: Secondary | ICD-10-CM | POA: Insufficient documentation

## 2022-10-01 DIAGNOSIS — Z3A19 19 weeks gestation of pregnancy: Secondary | ICD-10-CM | POA: Diagnosis not present

## 2022-10-08 ENCOUNTER — Telehealth: Payer: Self-pay | Admitting: *Deleted

## 2022-10-08 ENCOUNTER — Other Ambulatory Visit: Payer: Self-pay | Admitting: *Deleted

## 2022-10-08 NOTE — Telephone Encounter (Signed)
TC from pt reporting "pressure in my uterus". Pt describes pubic pain that worsens with walking or movement. Reports good FM. Pt asking for appt. Advised office unable to provide urgent appts. Advised to seek care in MAU if she is concerned for preterm labor or feels her condition is urgent. Pt verbalized understanding.

## 2022-10-17 ENCOUNTER — Ambulatory Visit (INDEPENDENT_AMBULATORY_CARE_PROVIDER_SITE_OTHER): Payer: Medicaid Other | Admitting: Obstetrics and Gynecology

## 2022-10-17 ENCOUNTER — Encounter: Payer: Self-pay | Admitting: Obstetrics and Gynecology

## 2022-10-17 VITALS — BP 132/86 | HR 112 | Wt 322.2 lb

## 2022-10-17 DIAGNOSIS — Z3A21 21 weeks gestation of pregnancy: Secondary | ICD-10-CM

## 2022-10-17 DIAGNOSIS — Z3480 Encounter for supervision of other normal pregnancy, unspecified trimester: Secondary | ICD-10-CM

## 2022-10-17 DIAGNOSIS — Z3482 Encounter for supervision of other normal pregnancy, second trimester: Secondary | ICD-10-CM | POA: Diagnosis not present

## 2022-10-17 DIAGNOSIS — R300 Dysuria: Secondary | ICD-10-CM

## 2022-10-17 DIAGNOSIS — R350 Frequency of micturition: Secondary | ICD-10-CM

## 2022-10-17 MED ORDER — PHENAZOPYRIDINE HCL 200 MG PO TABS: 200.0000 mg | ORAL_TABLET | Freq: Three times a day (TID) | ORAL | 0 refills | Status: AC

## 2022-10-17 MED ORDER — CEFADROXIL 500 MG PO CAPS: 500.0000 mg | ORAL_CAPSULE | Freq: Two times a day (BID) | ORAL | 0 refills | Status: DC

## 2022-10-17 NOTE — Progress Notes (Signed)
Pt presents for ROB visit. Pt reports dysuria, odor and frequency. No other concerns at this time.

## 2022-10-17 NOTE — Progress Notes (Signed)
LOW-RISK PREGNANCY OFFICE VISIT Patient name: Traci Castro MRN [id number]  Date of birth: 10/31/1995 Chief Complaint:   Routine Prenatal Visit  History of Present Illness:   Traci Castro is a 27 y.o. [id number] female at 16w4dwith an Estimated Date of Delivery: 02/23/23 being seen today for ongoing management of a low-risk pregnancy.  Today she reports  painful urination, odor and increased frequency . Contractions: Not present. Vag. Bleeding: None.  Movement: Present. denies leaking of fluid. Review of Systems:   Pertinent items are noted in HPI Denies abnormal vaginal discharge w/ itching/odor/irritation, headaches, visual changes, shortness of breath, chest pain, abdominal pain, severe nausea/vomiting, or problems with urination or bowel movements unless otherwise stated above. Pertinent History Reviewed:  Reviewed past medical,surgical, social, obstetrical and family history.  Reviewed problem list, medications and allergies. Physical Assessment:   Vitals:   10/17/22 1556  BP: 132/86  Pulse: (!) 112  Weight: (!) 322 lb 3.2 oz (146.1 kg)  Body mass index is 57.08 kg/m.        Physical Examination:   General appearance: Well appearing, and in no distress  Mental status: Alert, oriented to person, place, and time  Skin: Warm & dry  Cardiovascular: Normal heart rate noted  Respiratory: Normal respiratory effort, no distress  Abdomen: Soft, gravid, nontender  Pelvic: Cervical exam deferred         Extremities: Edema: None  Fetal Status: Fetal Heart Rate (bpm): 143   Movement: Present    No results found for this or any previous visit (from the past 24 hour(s)).  Assessment & Plan:  1) Low-risk pregnancy [id number] 233w4dith an Estimated Date of Delivery: 02/23/23   2) [name], antepartum  3) Dysuria - POCT urinalysis dipstick - Urine Culture - Rx: Cefadroxil 500 mg BID x 10 days - Rx: phenazopyridine (PYRIDIUM) 200 MG tablet; Take 1 tablet  (200 mg total) by mouth 3 (three) times daily with meals for 2 days.  Dispense: 6 tablet; Refill: 0  4) Urination frequency  5) [redacted] weeks gestation of pregnancy     Meds:  Meds ordered this encounter  Medications   DISCONTD: cefadroxil (DURICEF) 500 MG capsule    Sig: Take 1 capsule (500 mg total) by mouth 2 (two) times daily for 10 days.    Dispense:  20 capsule    Refill:  0    Order Specific Question:   Supervising Provider    Answer:   PRDonnamae [id number]]   phenazopyridine (PYRIDIUM) 200 MG tablet    Sig: Take 1 tablet (200 mg total) by mouth 3 (three) times daily with meals for 2 days.    Dispense:  6 tablet    Refill:  0    Order Specific Question:   Supervising Provider    Answer:   PRDonnamae [id number]]   cefadroxil (DURICEF) 500 MG capsule    Sig: Take 1 capsule (500 mg total) by mouth 2 (two) times daily for 10 days.    Dispense:  20 capsule    Refill:  0    Order Specific Question:   Supervising Provider    Answer:   PRDonnamae [id number] Labs/procedures today: [place] Continue routine obstetrical care, start UTI tx  Reviewed: Preterm labor symptoms and general obstetric precautions including but not limited to vaginal bleeding, contractions, leaking of fluid and fetal movement were reviewed in detail with the patient.  All questions  were answered. Has home bp cuff. Check bp weekly, let us know if >140/90.   Follow-up: Return in about 4 weeks (around 11/14/2022) for Return OB visit.  Orders Placed This Encounter  Procedures   Urine Culture   POCT urinalysis dipstick   Laury Deep MSN, CNM 10/17/2022 4:27 PM

## 2022-10-18 ENCOUNTER — Encounter: Payer: Self-pay | Admitting: Obstetrics and Gynecology

## 2022-10-19 ENCOUNTER — Encounter: Payer: Self-pay | Admitting: Obstetrics and Gynecology

## 2022-10-19 ENCOUNTER — Other Ambulatory Visit: Payer: Self-pay

## 2022-10-19 DIAGNOSIS — R300 Dysuria: Secondary | ICD-10-CM

## 2022-10-19 MED ORDER — CEFADROXIL 500 MG PO CAPS: 500.0000 mg | ORAL_CAPSULE | Freq: Two times a day (BID) | ORAL | 0 refills | Status: AC

## 2022-10-19 MED ORDER — NITROFURANTOIN MONOHYD MACRO 100 MG PO CAPS: 100.0000 mg | ORAL_CAPSULE | Freq: Two times a day (BID) | ORAL | 0 refills | Status: AC

## 2022-10-21 ENCOUNTER — Encounter (HOSPITAL_COMMUNITY): Payer: Self-pay | Admitting: Family Medicine

## 2022-10-21 ENCOUNTER — Other Ambulatory Visit: Payer: Self-pay

## 2022-10-21 ENCOUNTER — Inpatient Hospital Stay (HOSPITAL_COMMUNITY)
Admission: AD | Admit: 2022-10-21 | Discharge: 2022-10-21 | Disposition: A | Discharge status: Home / Self Care | Payer: Medicaid Other | Attending: Family Medicine | Admitting: Family Medicine

## 2022-10-21 DIAGNOSIS — Z3A22 22 weeks gestation of pregnancy: Secondary | ICD-10-CM | POA: Diagnosis not present

## 2022-10-21 DIAGNOSIS — M549 Dorsalgia, unspecified: Secondary | ICD-10-CM

## 2022-10-21 DIAGNOSIS — M545 Low back pain, unspecified: Secondary | ICD-10-CM | POA: Insufficient documentation

## 2022-10-21 DIAGNOSIS — R102 Pelvic and perineal pain: Secondary | ICD-10-CM | POA: Insufficient documentation

## 2022-10-21 DIAGNOSIS — O99891 Other specified diseases and conditions complicating pregnancy: Secondary | ICD-10-CM

## 2022-10-21 DIAGNOSIS — O212 Late vomiting of pregnancy: Secondary | ICD-10-CM | POA: Insufficient documentation

## 2022-10-21 DIAGNOSIS — O26892 Other specified pregnancy related conditions, second trimester: Secondary | ICD-10-CM | POA: Diagnosis present

## 2022-10-21 LAB — URINALYSIS, ROUTINE W REFLEX MICROSCOPIC
Bilirubin Urine: NEGATIVE
Glucose, UA: NEGATIVE mg/dL
Hgb urine dipstick: NEGATIVE
Ketones, ur: NEGATIVE mg/dL
Nitrite: NEGATIVE
Protein, ur: NEGATIVE mg/dL
Specific Gravity, Urine: 1.008 (ref 1.005–1.030)
pH: 5 (ref 5.0–8.0)

## 2022-10-21 MED ORDER — CYCLOBENZAPRINE HCL 5 MG PO TABS
10.0000 mg | ORAL_TABLET | Freq: Once | ORAL | Status: AC
  Administered 2022-10-21: 10 mg via ORAL
  Filled 2022-10-21: qty 2

## 2022-10-21 MED ORDER — ONDANSETRON 4 MG PO TBDP
8.0000 mg | ORAL_TABLET | Freq: Once | ORAL | Status: AC
  Administered 2022-10-21: 8 mg via ORAL
  Filled 2022-10-21: qty 2

## 2022-10-21 MED ORDER — HYDROMORPHONE HCL 1 MG/ML IJ SOLN
1.0000 mg | Freq: Once | INTRAMUSCULAR | Status: AC
  Administered 2022-10-21: 1 mg via INTRAMUSCULAR
  Filled 2022-10-21: qty 1

## 2022-10-21 MED ORDER — CYCLOBENZAPRINE HCL 10 MG PO TABS: 10.0000 mg | ORAL_TABLET | Freq: Three times a day (TID) | ORAL | 0 refills | Status: DC | PRN

## 2022-10-21 NOTE — MAU Provider Note (Cosign Needed Addendum)
Chief Complaint: Back Pain, Emesis, and pelvic pressure   Event Date/Time   First Provider Initiated Contact with Patient 10/21/22 0359      SUBJECTIVE HPI: Traci Castro is a 27 y.o. [id number] at 46w1dby early ultrasound who presents to maternity admissions reporting cramping low back pain x 3 days, that is worsening this morning. There is associated nausea, vomiting, and pelvic pressure. She denies recent intercourse or changes in activity. She tried Tylenol yesterday morning which did not help. The pain is in her low back, lasts around 15 seconds and occurs every 2-3 minutes. There is normal fetal movement She denies vaginal bleeding, vaginal itching/burning, urinary symptoms, h/a, dizziness, or fever/chills.     HPI  Past Medical History:  Diagnosis Date   Anemia    Anxiety    no meds   Bipolar disorder ([place]    Blood transfusion without reported diagnosis    for pp anemia not PPH   Depression    no meds   Gastritis    2 bouts   H/O seasonal allergies    Migraine    migraines   Psoriasis    Vision abnormalities    wears glasses   Past Surgical History:  Procedure Laterality Date   DILATION AND CURETTAGE OF UTERUS     DILATION AND EVACUATION N/A 09/20/2017   Procedure: SUCTION DILATATION AND EVACUATION;  Surgeon: PAletha Halim MD;  [facility];  Service: Gynecology;  Laterality: N/A;   ESOPHAGOGASTRODUODENOSCOPY N/A 10/17/2012   Procedure: ESOPHAGOGASTRODUODENOSCOPY (EGD);  Surgeon: JOletha Blend MD;  [facility]  Service: Gastroenterology;  Laterality: N/A;   THERAPEUTIC ABORTION  09/16/2017   Clinic   WISDOM TOOTH EXTRACTION     Social History   Socioeconomic History   Marital status: Single    Spouse name: Not on file   Number of children: Not on file   Years of education: Not on file   Highest education level: Not on file  Occupational History   Not on file  Tobacco Use   Smoking status: Never   Smokeless tobacco: Never  Vaping Use   Vaping  Use: Never used  Substance and Sexual Activity   Alcohol use: No   Drug use: No   Sexual activity: Yes    Birth control/protection: None  Other Topics Concern   Not on file  Social History Narrative   10th grade   Social Determinants of Health   Financial Resource Strain: Not on file  Food Insecurity: Not on file  Transportation Needs: Not on file  Physical Activity: Not on file  Stress: Not on file  Social Connections: Not on file  Intimate Partner Violence: Not on file   No current facility-administered medications on file prior to encounter.   Current Outpatient Medications on File Prior to Encounter  Medication Sig Dispense Refill   acetaminophen (TYLENOL) 500 MG tablet Take 1,000 mg by mouth every 6 (six) hours as needed for moderate pain.     Blood Pressure Monitoring (BLOOD PRESSURE KIT) DEVI 1 Device by Does not apply route once a week. 1 each 0   cefadroxil (DURICEF) 500 MG capsule Take 1 capsule (500 mg total) by mouth 2 (two) times daily for 10 days. 20 capsule 0   diphenhydrAMINE (BENADRYL) 25 MG tablet Take 1 tablet (25 mg total) by mouth every 6 (six) hours as needed. 30 tablet 0   metoCLOPramide (REGLAN) 10 MG tablet Take 1 tablet (10 mg total) by mouth every 8 (eight)  hours as needed. 30 tablet 0   nitrofurantoin, macrocrystal-monohydrate, (MACROBID) 100 MG capsule Take 1 capsule (100 mg total) by mouth 2 (two) times daily for 5 days. 10 capsule 0   ondansetron (ZOFRAN) 4 MG tablet Take 1 tablet (4 mg total) by mouth every 8 (eight) hours as needed for nausea or vomiting. 20 tablet 0   Prenatal Vit-Fe Fumarate-FA (PRENATAL VITAMIN PO) Take 1 tablet by mouth daily.     promethazine (PHENERGAN) 25 MG tablet Take 1 tablet (25 mg total) by mouth every 6 (six) hours as needed for nausea or vomiting. 30 tablet 1   Allergies  Allergen Reactions   Chlorhexidine Itching and Rash    Severe itching, redness.    Tramadol Nausea And Vomiting    ROS:  Review of Systems   Constitutional:  Negative for chills, fatigue and fever.  Eyes:  Negative for visual disturbance.  Respiratory:  Negative for shortness of breath.   Cardiovascular:  Negative for chest pain.  Gastrointestinal:  Positive for nausea and vomiting. Negative for abdominal pain.  Genitourinary:  Positive for pelvic pain. Negative for difficulty urinating, dysuria, flank pain, vaginal bleeding, vaginal discharge and vaginal pain.  Musculoskeletal:  Positive for back pain.  Neurological:  Negative for dizziness and headaches.  Psychiatric/Behavioral: Negative.       I have reviewed patient's Past Medical Hx, Surgical Hx, Family Hx, Social Hx, medications and allergies.   Physical Exam  Patient Vitals for the past 24 hrs:  BP Temp Temp src Pulse Resp SpO2 Height Weight  10/21/22 0358 133/84 -- -- (!) 117 19 -- -- --  10/21/22 0335 (!) 135/99 99 F (37.2 C) Oral (!) 122 19 100 % '5\' 4"'$  (1.626 m) (!) 145.7 kg   Constitutional: Well-developed, well-nourished female in no acute distress.  Cardiovascular: normal rate Respiratory: normal effort GI: Abd soft, non-tender. Pos BS x 4 MS: Extremities nontender, no edema, normal ROM Neurologic: Alert and oriented x 4.  GU: Neg CVAT.  Dilation: Closed Effacement (%): Thick Exam by:: Fatima Blank CNM   FHT 140 by bedside US  LAB RESULTS Results for orders placed or performed during the hospital encounter of 10/21/22 (from the past 24 hour(s))  Urinalysis, Routine w reflex microscopic -Urine, Clean Catch     Status: Abnormal   Collection Time: 10/21/22  5:45 AM  Result Value Ref Range   Color, Urine YELLOW YELLOW   APPearance CLOUDY (A) CLEAR   Specific Gravity, Urine 1.008 1.005 - 1.030   pH 5.0 5.0 - 8.0   Glucose, UA NEGATIVE NEGATIVE mg/dL   Hgb urine dipstick NEGATIVE NEGATIVE   Bilirubin Urine NEGATIVE NEGATIVE   Ketones, ur NEGATIVE NEGATIVE mg/dL   Protein, ur NEGATIVE NEGATIVE mg/dL   Nitrite NEGATIVE NEGATIVE    Leukocytes,Ua TRACE (A) NEGATIVE   RBC / HPF 0-5 0 - 5 RBC/hpf   WBC, UA 11-20 0 - 5 WBC/hpf   Bacteria, UA FEW (A) NONE SEEN   Squamous Epithelial / HPF 21-50 0 - 5 /HPF   Mucus PRESENT     A/Positive/-- (01/24 1312)  IMAGING  MAU Management/MDM: Orders Placed This Encounter  Procedures   Urinalysis, Routine w reflex microscopic -Urine, Clean Catch   Discharge patient    Meds ordered this encounter  Medications   HYDROmorphone (DILAUDID) injection 1 mg   ondansetron (ZOFRAN-ODT) disintegrating tablet 8 mg   cyclobenzaprine (FLEXERIL) tablet 10 mg   cyclobenzaprine (FLEXERIL) 10 MG tablet    Sig: Take 1 tablet (  10 mg total) by mouth 3 (three) times daily as needed for muscle spasms.    Dispense:  20 tablet    Refill:  0    Order Specific Question:   Supervising Provider    Answer:   Donnamae Jude [id number]    No acute abdomen or other emergent findings. Cervix closed/thick/high.  Back pain improved with IM Dilaudid.  UA without hemoglobin, no s/sx of UTI.  Pt given Flexeril in MAU and Rx sent to pharmacy. Keep schedule app in the office, return to MAU as needed for emergencies.     ASSESSMENT 1. Back pain affecting pregnancy in second trimester   2. [redacted] weeks gestation of pregnancy     PLAN Discharge home Allergies as of 10/21/2022       Reactions   Chlorhexidine Itching, Rash   Severe itching, redness.    Tramadol Nausea And Vomiting        Medication List     TAKE these medications    acetaminophen 500 MG tablet Commonly known as: TYLENOL Take 1,000 mg by mouth every 6 (six) hours as needed for moderate pain.   Blood Pressure Kit Devi 1 Device by Does not apply route once a week.   cefadroxil 500 MG capsule Commonly known as: DURICEF Take 1 capsule (500 mg total) by mouth 2 (two) times daily for 10 days.   cyclobenzaprine 10 MG tablet Commonly known as: FLEXERIL Take 1 tablet (10 mg total) by mouth 3 (three) times daily as needed for muscle spasms.    diphenhydrAMINE 25 MG tablet Commonly known as: BENADRYL Take 1 tablet (25 mg total) by mouth every 6 (six) hours as needed.   metoCLOPramide 10 MG tablet Commonly known as: REGLAN Take 1 tablet (10 mg total) by mouth every 8 (eight) hours as needed.   nitrofurantoin (macrocrystal-monohydrate) 100 MG capsule Commonly known as: MACROBID Take 1 capsule (100 mg total) by mouth 2 (two) times daily for 5 days.   ondansetron 4 MG tablet Commonly known as: Zofran Take 1 tablet (4 mg total) by mouth every 8 (eight) hours as needed for nausea or vomiting.   PRENATAL VITAMIN PO Take 1 tablet by mouth daily.   promethazine 25 MG tablet Commonly known as: PHENERGAN Take 1 tablet (25 mg total) by mouth every 6 (six) hours as needed for nausea or vomiting.        Follow-up Information     [facility] Follow up.   Why: As scheduled Contact information: [place] [phone] [address] [facility] Follow up.   Specialty: Obstetrics and Gynecology Contact information: [address] [id number] [place] Certified Nurse-Midwife 10/21/2022  7:00 AM

## 2022-10-21 NOTE — MAU Note (Signed)
Pt tolerated clear liquids and crackers throughout stay without vomiting. Hot packs provided for patients back discomfort.

## 2022-10-21 NOTE — MAU Note (Signed)
.  Traci Castro is a 27 y.o. at 96w1dhere in MAU reporting: lower back pain, vomiting, and pelvic pressure for the past 3 days. States she has not been able to eat much. Denies HA, visual changes, epigastric pain, VB or LOF. +FM  Onset of complaint: 3 days  Pain score: 10 - constant aching back pain; 4 - pelvic pressure  Vitals:   10/21/22 0335  BP: (!) 135/99  Pulse: (!) 122  Resp: 19  Temp: 99 F (37.2 C)  SpO2: 100%     FHT: pt too uncomfortable for RN to doppler FHR Lab orders placed from triage:  UA

## 2022-10-22 LAB — URINE CULTURE

## 2022-10-23 ENCOUNTER — Other Ambulatory Visit: Payer: Self-pay

## 2022-10-23 DIAGNOSIS — K5900 Constipation, unspecified: Secondary | ICD-10-CM

## 2022-10-23 MED ORDER — DOCUSATE SODIUM 100 MG PO CAPS: 100.0000 mg | ORAL_CAPSULE | Freq: Two times a day (BID) | ORAL | 0 refills | Status: AC

## 2022-10-23 NOTE — Progress Notes (Signed)
Patient reports constipation.  Colace sent to the pharmacy per protocol. Patient aware to drink 64-96 ounces of water, eat fiber rich foods, and stay active to alleviate the symptoms.

## 2022-10-30 ENCOUNTER — Ambulatory Visit: Payer: Medicaid Other

## 2022-11-06 ENCOUNTER — Other Ambulatory Visit: Payer: Self-pay | Admitting: *Deleted

## 2022-11-06 ENCOUNTER — Ambulatory Visit (HOSPITAL_BASED_OUTPATIENT_CLINIC_OR_DEPARTMENT_OTHER): Payer: Medicaid Other

## 2022-11-06 ENCOUNTER — Ambulatory Visit: Payer: Medicaid Other | Attending: Obstetrics | Admitting: *Deleted

## 2022-11-06 VITALS — BP 126/63 | HR 100

## 2022-11-06 DIAGNOSIS — Z3A24 24 weeks gestation of pregnancy: Secondary | ICD-10-CM | POA: Insufficient documentation

## 2022-11-06 DIAGNOSIS — Z148 Genetic carrier of other disease: Secondary | ICD-10-CM | POA: Insufficient documentation

## 2022-11-06 DIAGNOSIS — Z362 Encounter for other antenatal screening follow-up: Secondary | ICD-10-CM

## 2022-11-06 DIAGNOSIS — O99212 Obesity complicating pregnancy, second trimester: Secondary | ICD-10-CM | POA: Diagnosis not present

## 2022-11-06 DIAGNOSIS — E669 Obesity, unspecified: Secondary | ICD-10-CM

## 2022-11-06 DIAGNOSIS — O285 Abnormal chromosomal and genetic finding on antenatal screening of mother: Secondary | ICD-10-CM | POA: Diagnosis not present

## 2022-11-06 DIAGNOSIS — D563 Thalassemia minor: Secondary | ICD-10-CM | POA: Diagnosis not present

## 2022-11-06 DIAGNOSIS — Z3480 Encounter for supervision of other normal pregnancy, unspecified trimester: Secondary | ICD-10-CM

## 2022-11-06 DIAGNOSIS — O99213 Obesity complicating pregnancy, third trimester: Secondary | ICD-10-CM

## 2022-11-14 ENCOUNTER — Ambulatory Visit (INDEPENDENT_AMBULATORY_CARE_PROVIDER_SITE_OTHER): Payer: Medicaid Other | Admitting: Obstetrics and Gynecology

## 2022-11-14 ENCOUNTER — Encounter: Payer: Self-pay | Admitting: Obstetrics and Gynecology

## 2022-11-14 VITALS — BP 134/81 | HR 109 | Wt 326.0 lb

## 2022-11-14 DIAGNOSIS — Z3480 Encounter for supervision of other normal pregnancy, unspecified trimester: Secondary | ICD-10-CM

## 2022-11-14 DIAGNOSIS — O99212 Obesity complicating pregnancy, second trimester: Secondary | ICD-10-CM

## 2022-11-14 DIAGNOSIS — O99210 Obesity complicating pregnancy, unspecified trimester: Secondary | ICD-10-CM | POA: Insufficient documentation

## 2022-11-14 DIAGNOSIS — Z3A25 25 weeks gestation of pregnancy: Secondary | ICD-10-CM

## 2022-11-14 MED ORDER — COMFORT FIT MATERNITY SUPP MED MISC: 0 refills | Status: DC

## 2022-11-14 NOTE — Progress Notes (Signed)
   PRENATAL VISIT NOTE  Subjective:  Traci Castro is a 27 y.o. [phone] at [redacted]w[redacted]d being seen today for ongoing prenatal care.  She is currently monitored for the following issues for this low-risk pregnancy and has BMI 45.0-49.9, adult ([place]); [name]; Migraine headache; [name], antepartum; Pain, dental; and Maternal obesity affecting pregnancy, antepartum on their problem list.  Patient reports backache.  Contractions: Not present. Vag. Bleeding: None.  Movement: Present. Denies leaking of fluid.   The following portions of the patient's history were reviewed and updated as appropriate: allergies, current medications, past family history, past medical history, past social history, past surgical history and problem list.   Objective:   Vitals:   11/14/22 1042  BP: 134/81  Pulse: (!) 109  Weight: (!) 326 lb (147.9 kg)    Fetal Status: Fetal Heart Rate (bpm): 150 Fundal Height: 27 cm Movement: Present     General:  Alert, oriented and cooperative. Patient is in no acute distress.  Skin: Skin is warm and dry. No rash noted.   Cardiovascular: Normal heart rate noted  Respiratory: Normal respiratory effort, no problems with respiration noted  Abdomen: Soft, gravid, appropriate for gestational age.  Pain/Pressure: Present     Pelvic: Cervical exam deferred        Extremities: Normal range of motion.  Edema: None  Mental Status: Normal mood and affect. Normal behavior. Normal judgment and thought content.   Assessment and Plan:  Pregnancy: [id number] at [redacted]w[redacted]d 1. [name], antepartum Patient is doing well Rx maternity support belt provided Third trimester labs next visit with glucola Follow up growth ultrasound and incomplete anatomy on 4/11  2. Obesity affecting pregnancy, antepartum, unspecified obesity type   Preterm labor symptoms and general obstetric precautions including but not limited to vaginal bleeding,  contractions, leaking of fluid and fetal movement were reviewed in detail with the patient. Please refer to After Visit Summary for other counseling recommendations.   Return in about 2 weeks (around 11/28/2022) for Virtual, ROB, Low risk, 2 hr glucola next visit.  Future Appointments  Date Time Provider [place]  12/06/2022 10:15 AM [facility] NURSE [facility]  12/06/2022 10:30 AM [facility] US2 [facility] [place]    Mora Bellman, MD

## 2022-11-29 ENCOUNTER — Other Ambulatory Visit: Payer: Medicaid Other

## 2022-11-29 ENCOUNTER — Ambulatory Visit (INDEPENDENT_AMBULATORY_CARE_PROVIDER_SITE_OTHER): Payer: Medicaid Other | Admitting: Obstetrics and Gynecology

## 2022-11-29 ENCOUNTER — Telehealth: Payer: Self-pay

## 2022-11-29 ENCOUNTER — Ambulatory Visit: Payer: Medicaid Other | Attending: Obstetrics and Gynecology | Admitting: Physical Therapy

## 2022-11-29 ENCOUNTER — Other Ambulatory Visit: Payer: Self-pay

## 2022-11-29 ENCOUNTER — Encounter: Payer: Self-pay | Admitting: Physical Therapy

## 2022-11-29 ENCOUNTER — Encounter: Payer: Self-pay | Admitting: Obstetrics and Gynecology

## 2022-11-29 VITALS — BP 132/83 | Wt 329.0 lb

## 2022-11-29 DIAGNOSIS — Z3480 Encounter for supervision of other normal pregnancy, unspecified trimester: Secondary | ICD-10-CM | POA: Insufficient documentation

## 2022-11-29 DIAGNOSIS — O26892 Other specified pregnancy related conditions, second trimester: Secondary | ICD-10-CM | POA: Diagnosis not present

## 2022-11-29 DIAGNOSIS — Z3A27 27 weeks gestation of pregnancy: Secondary | ICD-10-CM

## 2022-11-29 DIAGNOSIS — M6281 Muscle weakness (generalized): Secondary | ICD-10-CM | POA: Diagnosis not present

## 2022-11-29 DIAGNOSIS — Z3482 Encounter for supervision of other normal pregnancy, second trimester: Secondary | ICD-10-CM | POA: Diagnosis not present

## 2022-11-29 DIAGNOSIS — Z1339 Encounter for screening examination for other mental health and behavioral disorders: Secondary | ICD-10-CM

## 2022-11-29 DIAGNOSIS — M5459 Other low back pain: Secondary | ICD-10-CM | POA: Insufficient documentation

## 2022-11-29 DIAGNOSIS — O99210 Obesity complicating pregnancy, unspecified trimester: Secondary | ICD-10-CM

## 2022-11-29 NOTE — Progress Notes (Signed)
ROB/GTT.  C/o pain 8/10 on left side of her body.

## 2022-11-29 NOTE — Therapy (Signed)
OUTPATIENT PHYSICAL THERAPY THORACOLUMBAR EVALUATION   Patient Name: Traci Castro MRN: [id number] DOB:[birth year], 27 y.o., female Today's Date: 11/29/2022  END OF SESSION:  PT End of Session - 11/29/22 1229     Visit Number 1    Date for PT Re-Evaluation 01/24/23    Authorization Type [employer] community Medicaid    PT Start Time 1230    PT Stop Time 1310    PT Time Calculation (min) 40 min    Activity Tolerance Patient tolerated treatment well             Past Medical History:  Diagnosis Date   Anemia    Anxiety    no meds   Bipolar disorder    Blood transfusion without reported diagnosis    for pp anemia not PPH   Depression    no meds   Gastritis    2 bouts   H/O seasonal allergies    Migraine    migraines   Psoriasis    Vision abnormalities    wears glasses   Past Surgical History:  Procedure Laterality Date   DILATION AND CURETTAGE OF UTERUS     DILATION AND EVACUATION N/A 09/20/2017   Procedure: SUCTION DILATATION AND EVACUATION;  Surgeon: Aletha Halim, MD;  Location: [facility];  Service: Gynecology;  Laterality: N/A;   ESOPHAGOGASTRODUODENOSCOPY N/A 10/17/2012   Procedure: ESOPHAGOGASTRODUODENOSCOPY (EGD);  Surgeon: Oletha Blend, MD;  Location: [facility];  Service: Gastroenterology;  Laterality: N/A;   THERAPEUTIC ABORTION  09/16/2017   Clinic   WISDOM TOOTH EXTRACTION     Patient Active Problem List   Diagnosis Date Noted   Maternal obesity affecting pregnancy, antepartum 11/14/2022   Pain, dental 09/19/2022   [name], antepartum 06/28/2022   Migraine headache 04/25/2022   [name] 01/31/2018   BMI 45.0-49.9, adult 03/15/2016    PCP: none  REFERRING PROVIDER: Arlina Robes MD  REFERRING DIAG:  back and LE pain during pregnancy  Rationale for Evaluation and Treatment: Rehabilitation  THERAPY DIAG:  Other low back pain  Muscle weakness (generalized)  ONSET DATE: 08/30/2022  SUBJECTIVE:                                                                                                                                                                                            SUBJECTIVE STATEMENT: Left back, left thigh to knee level to groin as well for 3 months;  pregnancy belt doesn't help; I sometimes have accidents b/c I can't move fast enough to get to the bathroom  PERTINENT HISTORY:  Pronounced "casual" Due 2023/03/07 [redacted] weeks pregnant no restrictions 3  children, ages 3-6 Bipolar, migraine PAIN:  PAIN:  Are you having pain? Yes NPRS scale: 8/10 Pain location: Left back, left thigh to knee level to groin Aggravating factors: sidelying on either side; rolling over; walking; sitting; getting out of bed Relieving factors: stand up briefly and move hips side to side; muscle relaxers don't help  PRECAUTIONS: Other: pregnancy  WEIGHT BEARING RESTRICTIONS: No  FALLS:  Has patient fallen in last 6 months? No  LIVING ENVIRONMENT: Lives with: lives with their family Lives in: House/apartment   PLOF: Independent  PATIENT GOALS: alleviate pain for remainder of pregnancy  OBJECTIVE:    PATIENT SURVEYS:  Modified Oswestry 78% severe/bedbound status    COGNITION: Overall cognitive status: Within functional limits for tasks assessed      LUMBAR ROM:  pain limited in all planes 50% movement loss with flexion, extension and right/left sidebending  LOWER EXTREMITY ROM:   Full LE ROM but painful with all movements  LOWER EXTREMITY MMT:  SLS < 5 sec with pelvic drop on left;  left hip abduction and extension 4-/5;  knee extension 4-/5   FUNCTIONAL TESTS:  Needs UE assist for sit to stand  GAIT: Comments: slow, antalgic  TODAY'S TREATMENT:                                                                                                                              DATE: 4/4     PATIENT EDUCATION:  Education details: Educated patient on anatomy and physiology of current symptoms,  prognosis, plan of care as well as initial self care strategies to promote recovery  Person educated: Patient Education method: Explanation Education comprehension: verbalized understanding  HOME EXERCISE PROGRAM: Access Code: [facility] URL: [email or web address] Date: 11/29/2022 Prepared by: Ruben Im  Exercises - Seated Hip Adductor Stretch (Mirrored)  - 1 x daily - 7 x weekly - 1 sets - 10 reps - 10 hold - Right Standing Lateral Shift Correction at [place]  - 1 x daily - 7 x weekly - 1 sets - 10 reps - Standing Lumbar Extension at [place]  - 1 x daily - 7 x weekly - 1 sets - 10 reps - Standing Hip Flexor Stretch (Mirrored)  - 1 x daily - 7 x weekly - 1 sets - 10 reps - Cat Cow  - 1 x daily - 7 x weekly - 1 sets - 10 reps - Clamshell  - 1 x daily - 7 x weekly - 1 sets - 10 reps  ASSESSMENT:  CLINICAL IMPRESSION: Patient is a 27 y.o. female who was seen today for physical therapy evaluation and treatment for left back pain, groin pain and thigh pain during pregnancy. Symptoms began 3 months ago and aggravated with sidelying, turning in bed, sitting and walking.  She has no previous history of back pain even in her previous 3 pregnancies.   Slow, antalgic gait and grimacing/bracing with transitional movements.  Very high  Oswestry functional outcome score.  She would benefit from aquatic PT for buoyancy and unloading properties of water.    OBJECTIVE IMPAIRMENTS: decreased activity tolerance, difficulty walking, decreased ROM, decreased strength, impaired perceived functional ability, and pain.   ACTIVITY LIMITATIONS: carrying, lifting, bending, sitting, standing, squatting, sleeping, bed mobility, toileting, dressing, locomotion level, and caring for others  PARTICIPATION LIMITATIONS: meal prep, cleaning, laundry, shopping, and community activity  REHAB POTENTIAL: Good  CLINICAL DECISION MAKING: Stable/uncomplicated  EVALUATION COMPLEXITY:  Low   GOALS: Goals reviewed with patient? Yes  SHORT TERM GOALS: Target date: 12/27/2022    The patient will demonstrate knowledge of basic self care strategies and exercises to improve mobility for taking care of her children   Baseline: Goal status: INITIAL  2.  The patient will be able to sit for 15 min Baseline:  Goal status: INITIAL  3.  The patient will be able to perform light housework Baseline:  Goal status: INITIAL   LONG TERM GOALS: Target date: 01/24/2023    The patient will be independent in a safe self progression of a home exercise program to promote further recovery of function   Baseline:  Goal status: INITIAL  2.  The patient will be able to walk 15-20 needed for health during and following pregnancy Baseline:  Goal status: INITIAL  3.  The patient will have improved left hip strength to at least 4/5 needed for standing, walking longer distances and descending stairs at home and in the community  Baseline:  Goal status: INITIAL  4.  Modified Oswestry score improved to 65% indicating improved function with less pain Baseline:  Goal status: INITIAL  PLAN:  PT FREQUENCY: 1x/week  PT DURATION: 8 weeks  PLANNED INTERVENTIONS: Therapeutic exercises, Therapeutic activity, Neuromuscular re-education, Patient/Family education, Self Care, Joint mobilization, Aquatic Therapy, Dry Needling, Spinal mobilization, Cryotherapy, Moist heat, Taping, Manual therapy, and Re-evaluation.  PLAN FOR NEXT SESSION: assess response to low level mobility ex's;  try ball ex; aquatic PT  Ruben Im, PT 11/29/22 7:38 PM Phone: [phone] Fax: [phone]

## 2022-11-29 NOTE — Patient Instructions (Signed)
     [facility] Welcome to [facility]! Here you will find all the information you will need regarding your pool therapy. If you have further questions at any time, please call our office at [phone]. After completing your initial evaluation in the [facility], you may be eligible to complete a portion of your therapy in the pool. A typical week of therapy will consist of 1-2 typical physical therapy visits at our [facility] location and an additional session of therapy in the pool located at the [facility] at [address]. [address], [place] [address]. The phone number at the pool site is [phone]. Please call this number if you are running late or need to cancel your appointment.  Aquatic therapy will be offered on Wednesday mornings and Friday afternoons. Each session will last approximately 45 minutes. All scheduling and payments for aquatic therapy sessions, including cancelations, will be done through our [facility] location.  To be eligible for aquatic therapy, these criteria must be met: You must be able to independently change in the locker room and get to the pool deck. A caregiver can come with you to help if needed. There are benches for a caregiver to sit on next to the pool. No one with an open wound is permitted in the pool.  Handicap parking is available in the front and there is a drop off option for even closer accessibility. Please arrive 15 minutes prior to your appointment to prepare for your pool session. You must sign in at the front desk upon your arrival. Please be sure to attend to any toileting needs prior to entering the pool. Locker rooms for changing are available.  There is direct access to the pool deck from the locker room. You can lock your belongings in a locker or bring them with you poolside. Your therapist will greet you on the pool deck. There may be other swimmers in the pool at the  same time but your session is one-on-one with the therapist.   

## 2022-11-29 NOTE — Progress Notes (Signed)
Subjective:  Traci Castro is a 27 y.o. [phone] at [redacted]w[redacted]d being seen today for ongoing prenatal care.  She is currently monitored for the following issues for this low-risk pregnancy and has BMI 45.0-49.9, adult; [name]; Migraine headache; [name], antepartum; Pain, dental; and Maternal obesity affecting pregnancy, antepartum on their problem list.  Patient reports  general discomforts of pregnancy  .  Contractions: Not present. Vag. Bleeding: None.  Movement: Present. Denies leaking of fluid.   The following portions of the patient's history were reviewed and updated as appropriate: allergies, current medications, past family history, past medical history, past social history, past surgical history and problem list. Problem list updated.  Objective:   Vitals:   11/29/22 0851  BP: 132/83  Weight: (!) 329 lb (149.2 kg)    Fetal Status: Fetal Heart Rate (bpm): 143   Movement: Present     General:  Alert, oriented and cooperative. Patient is in no acute distress.  Skin: Skin is warm and dry. No rash noted.   Cardiovascular: Normal heart rate noted  Respiratory: Normal respiratory effort, no problems with respiration noted  Abdomen: Soft, gravid, appropriate for gestational age. Pain/Pressure: Absent     Pelvic:  Cervical exam deferred        Extremities: Normal range of motion.  Edema: None  Mental Status: Normal mood and affect. Normal behavior. Normal judgment and thought content.   Urinalysis:      Assessment and Plan:  Pregnancy: [id number] at [redacted]w[redacted]d  1. [name], antepartum Stable Glucola today Referral for PT  2. Obesity affecting pregnancy, antepartum, unspecified obesity type Growth scan next week  Preterm labor symptoms and general obstetric precautions including but not limited to vaginal bleeding, contractions, leaking of fluid and fetal movement were reviewed in detail with the patient. Please refer  to After Visit Summary for other counseling recommendations.  Return in about 2 weeks (around 12/13/2022) for OB visit, face to face, any provider.   Chancy Milroy, MD

## 2022-11-29 NOTE — Telephone Encounter (Signed)
Breast pump order signed and faxed back

## 2022-11-30 ENCOUNTER — Other Ambulatory Visit: Payer: Self-pay

## 2022-11-30 DIAGNOSIS — O99013 Anemia complicating pregnancy, third trimester: Secondary | ICD-10-CM

## 2022-11-30 LAB — CBC
Hematocrit: 30.3 % — ABNORMAL LOW (ref 34.0–46.6)
Hemoglobin: 9.7 g/dL — ABNORMAL LOW (ref 11.1–15.9)
MCH: 25.3 pg — ABNORMAL LOW (ref 26.6–33.0)
MCHC: 32 g/dL (ref 31.5–35.7)
MCV: 79 fL (ref 79–97)
Platelets: 342 10*3/uL (ref 150–450)
RBC: 3.84 x10E6/uL (ref 3.77–5.28)
RDW: 14 % (ref 11.7–15.4)
WBC: 9.4 10*3/uL (ref 3.4–10.8)

## 2022-11-30 LAB — RPR: RPR Ser Ql: NONREACTIVE

## 2022-11-30 LAB — GLUCOSE TOLERANCE, 2 HOURS W/ 1HR
Glucose, 1 hour: 140 mg/dL (ref 70–179)
Glucose, 2 hour: 99 mg/dL (ref 70–152)
Glucose, Fasting: 87 mg/dL (ref 70–91)

## 2022-11-30 LAB — HIV ANTIBODY (ROUTINE TESTING W REFLEX): HIV Screen 4th Generation wRfx: NONREACTIVE

## 2022-11-30 MED ORDER — FERROUS SULFATE 325 (65 FE) MG PO TBEC
325.0000 mg | DELAYED_RELEASE_TABLET | ORAL | 3 refills | Status: DC
Start: 2022-11-30 — End: 2023-03-14

## 2022-12-05 ENCOUNTER — Encounter: Payer: Self-pay | Admitting: Physical Therapy

## 2022-12-05 ENCOUNTER — Ambulatory Visit: Payer: Medicaid Other | Admitting: Physical Therapy

## 2022-12-05 DIAGNOSIS — M5459 Other low back pain: Secondary | ICD-10-CM

## 2022-12-05 DIAGNOSIS — O26892 Other specified pregnancy related conditions, second trimester: Secondary | ICD-10-CM | POA: Diagnosis not present

## 2022-12-05 DIAGNOSIS — M6281 Muscle weakness (generalized): Secondary | ICD-10-CM

## 2022-12-05 NOTE — Therapy (Signed)
OUTPATIENT PHYSICAL THERAPY TREATMENT NOTE      Patient Name: Traci Castro MRN: [id number] DOB:14-Sep-1995, 27 y.o., female Today's Date: 12/05/2022  PCP: none  REFERRING PROVIDER: Nettie Elm MD   END OF SESSION:  PT End of Session - 12/05/22 1020     Visit Number 2    Date for PT Re-Evaluation 01/24/23    Authorization Type [employer] community Medicaid    PT Start Time 1016    PT Stop Time 1055    PT Time Calculation (min) 39 min    Activity Tolerance Patient tolerated treatment well;Patient limited by pain              Past Medical History:  Diagnosis Date   Anemia    Anxiety    no meds   Bipolar disorder    Blood transfusion without reported diagnosis    for pp anemia not PPH   Depression    no meds   Gastritis    2 bouts   H/O seasonal allergies    Migraine    migraines   Psoriasis    Vision abnormalities    wears glasses   Past Surgical History:  Procedure Laterality Date   DILATION AND CURETTAGE OF UTERUS     DILATION AND EVACUATION N/A 09/20/2017   Procedure: SUCTION DILATATION AND EVACUATION;  Surgeon: Kingman Bing, MD;  Location: [facility];  Service: Gynecology;  Laterality: N/A;   ESOPHAGOGASTRODUODENOSCOPY N/A 10/17/2012   Procedure: ESOPHAGOGASTRODUODENOSCOPY (EGD);  Surgeon: Jon Gills, MD;  Location: [facility];  Service: Gastroenterology;  Laterality: N/A;   THERAPEUTIC ABORTION  09/16/2017   Clinic   WISDOM TOOTH EXTRACTION     Patient Active Problem List   Diagnosis Date Noted   Maternal obesity affecting pregnancy, antepartum 11/14/2022   Pain, dental 09/19/2022   [name], antepartum 06/28/2022   Migraine headache 04/25/2022   [name] 01/31/2018   BMI 45.0-49.9, adult 03/15/2016    PCP: none  REFERRING PROVIDER: Nettie Elm MD  REFERRING DIAG:  back and LE pain during pregnancy  Rationale for Evaluation and Treatment: Rehabilitation  THERAPY DIAG:  Other low back pain  Muscle  weakness (generalized)  ONSET DATE: 08/30/2022  SUBJECTIVE:                                                                                                                                                                                           SUBJECTIVE STATEMENT: Pt states that her pain is still present. No better from her eval. Trying to do HEP but the only one that is tolerable is the clamshell.  PERTINENT HISTORY:  Pronounced "  casual" Due 6/29 [redacted] weeks pregnant no restrictions 3 children, ages 3-6 Bipolar, migraine PAIN:  PAIN:  Are you having pain? Yes NPRS scale: 9/10 Pain location: Left back, left thigh to knee level to groin Aggravating factors: sidelying on either side; rolling over; walking; sitting; getting out of bed Relieving factors: stand up briefly and move hips side to side; muscle relaxers don't help  PRECAUTIONS: Other: pregnancy  WEIGHT BEARING RESTRICTIONS: No  FALLS:  Has patient fallen in last 6 months? No  LIVING ENVIRONMENT: Lives with: lives with their family Lives in: House/apartment   PLOF: Independent  PATIENT GOALS: alleviate pain for remainder of pregnancy  OBJECTIVE:    PATIENT SURVEYS:  Modified Oswestry 78% severe/bedbound status    COGNITION: Overall cognitive status: Within functional limits for tasks assessed      LUMBAR ROM:  pain limited in all planes 50% movement loss with flexion, extension and right/left sidebending  LOWER EXTREMITY ROM:   Full LE ROM but painful with all movements  LOWER EXTREMITY MMT:  SLS < 5 sec with pelvic drop on left;  left hip abduction and extension 4-/5;  knee extension 4-/5   FUNCTIONAL TESTS:  Needs UE assist for sit to stand  GAIT: Comments: slow, antalgic  TODAY'S TREATMENT:                                                                                                                              DATE:  4/10 Nustep x3 min, Pt having difficulty with this due to Lt LE  pain Seated posterior pelvic tilts with focus on breathing, stopped with pain onset 8 reps Attempted long sitting hip 90/90 rotation, stopped with pain at 4 reps Seated thoracic extension over foam roll 2x10  Seated deep breathing for relaxation Manual: STM Lt quads,glutes, pt in Rt sidelying  4/4     PATIENT EDUCATION:  Education details: technique with therex Person educated: Patient Education method: Explanation Education comprehension: verbalized understanding  HOME EXERCISE PROGRAM: Access Code: [email or web address] URL: [email or web address] Date: 11/29/2022 Prepared by: Lavinia SharpsStacy Simpson  Exercises - Seated Hip Adductor Stretch (Mirrored)  - 1 x daily - 7 x weekly - 1 sets - 10 reps - 10 hold - Right Standing Lateral Shift Correction at [place] - Hold  - 1 x daily - 7 x weekly - 1 sets - 10 reps - Standing Lumbar Extension at Wall - Forearms  - 1 x daily - 7 x weekly - 1 sets - 10 reps - Standing Hip Flexor Stretch (Mirrored)  - 1 x daily - 7 x weekly - 1 sets - 10 reps - Cat Cow  - 1 x daily - 7 x weekly - 1 sets - 10 reps - Clamshell  - 1 x daily - 7 x weekly - 1 sets - 10 reps  ASSESSMENT:  CLINICAL IMPRESSION: Patient continues to have high levels of Lt hip and back pain. Found it difficult to find positions for  comfort. Despite several modifications, pt had difficulty completing more than 3-4 reps of each exercise. Completed soft tissue mobilization end of session to attempt at providing some relief.  OBJECTIVE IMPAIRMENTS: decreased activity tolerance, difficulty walking, decreased ROM, decreased strength, impaired perceived functional ability, and pain.   ACTIVITY LIMITATIONS: carrying, lifting, bending, sitting, standing, squatting, sleeping, bed mobility, toileting, dressing, locomotion level, and caring for others  PARTICIPATION LIMITATIONS: meal prep, cleaning, laundry, shopping, and community activity  REHAB POTENTIAL: Good  CLINICAL DECISION MAKING:  Stable/uncomplicated  EVALUATION COMPLEXITY: Low   GOALS: Goals reviewed with patient? Yes  SHORT TERM GOALS: Target date: 12/27/2022    The patient will demonstrate knowledge of basic self care strategies and exercises to improve mobility for taking care of her children   Baseline: Goal status: INITIAL  2.  The patient will be able to sit for 15 min Baseline:  Goal status: INITIAL  3.  The patient will be able to perform light housework Baseline:  Goal status: INITIAL   LONG TERM GOALS: Target date: 01/24/2023    The patient will be independent in a safe self progression of a home exercise program to promote further recovery of function   Baseline:  Goal status: INITIAL  2.  The patient will be able to walk 15-20 needed for health during and following pregnancy Baseline:  Goal status: INITIAL  3.  The patient will have improved left hip strength to at least 4/5 needed for standing, walking longer distances and descending stairs at home and in the community  Baseline:  Goal status: INITIAL  4.  Modified Oswestry score improved to 65% indicating improved function with less pain Baseline:  Goal status: INITIAL  PLAN:  PT FREQUENCY: 1x/week  PT DURATION: 8 weeks  PLANNED INTERVENTIONS: Therapeutic exercises, Therapeutic activity, Neuromuscular re-education, Patient/Family education, Self Care, Joint mobilization, Aquatic Therapy, Dry Needling, Spinal mobilization, Cryotherapy, Moist heat, Taping, Manual therapy, and Re-evaluation.  PLAN FOR NEXT SESSION: low level movement; if still unable to perform exercises on land, consider only aquatic visits  11:50 AM,12/05/22 Donita Brooks PT, DPT [facility] [place]  [phone]

## 2022-12-06 ENCOUNTER — Ambulatory Visit: Payer: Medicaid Other | Attending: Obstetrics

## 2022-12-06 ENCOUNTER — Ambulatory Visit: Payer: Medicaid Other | Admitting: *Deleted

## 2022-12-06 ENCOUNTER — Other Ambulatory Visit: Payer: Self-pay | Admitting: *Deleted

## 2022-12-06 VITALS — BP 123/76 | HR 109

## 2022-12-06 DIAGNOSIS — O3663X0 Maternal care for excessive fetal growth, third trimester, not applicable or unspecified: Secondary | ICD-10-CM | POA: Diagnosis not present

## 2022-12-06 DIAGNOSIS — O99213 Obesity complicating pregnancy, third trimester: Secondary | ICD-10-CM

## 2022-12-06 DIAGNOSIS — Z362 Encounter for other antenatal screening follow-up: Secondary | ICD-10-CM | POA: Diagnosis present

## 2022-12-06 DIAGNOSIS — D563 Thalassemia minor: Secondary | ICD-10-CM

## 2022-12-06 DIAGNOSIS — E669 Obesity, unspecified: Secondary | ICD-10-CM

## 2022-12-06 DIAGNOSIS — O285 Abnormal chromosomal and genetic finding on antenatal screening of mother: Secondary | ICD-10-CM | POA: Diagnosis not present

## 2022-12-06 DIAGNOSIS — Z3A28 28 weeks gestation of pregnancy: Secondary | ICD-10-CM

## 2022-12-06 DIAGNOSIS — Z3689 Encounter for other specified antenatal screening: Secondary | ICD-10-CM

## 2022-12-12 ENCOUNTER — Encounter: Payer: Self-pay | Admitting: Physical Therapy

## 2022-12-12 ENCOUNTER — Ambulatory Visit: Payer: Medicaid Other | Admitting: Physical Therapy

## 2022-12-12 DIAGNOSIS — O26892 Other specified pregnancy related conditions, second trimester: Secondary | ICD-10-CM | POA: Diagnosis not present

## 2022-12-12 DIAGNOSIS — M6281 Muscle weakness (generalized): Secondary | ICD-10-CM

## 2022-12-12 DIAGNOSIS — M5459 Other low back pain: Secondary | ICD-10-CM

## 2022-12-12 NOTE — Therapy (Signed)
OUTPATIENT PHYSICAL THERAPY TREATMENT NOTE      Patient Name: Traci Castro MRN: [id number] DOB:02/21/1996, 27 y.o., female Today's Date: 12/12/2022  PCP: none  REFERRING PROVIDER: Nettie Elm MD   END OF SESSION:  PT End of Session - 12/12/22 1019     Visit Number 3    Date for PT Re-Evaluation 01/24/23    Authorization Type [employer] community Medicaid    PT Start Time 1016    PT Stop Time 1100    PT Time Calculation (min) 44 min    Activity Tolerance Patient limited by pain    Behavior During Therapy WFL for tasks assessed/performed               Past Medical History:  Diagnosis Date   Anemia    Anxiety    no meds   Bipolar disorder    Blood transfusion without reported diagnosis    for pp anemia not PPH   Depression    no meds   Gastritis    2 bouts   H/O seasonal allergies    Migraine    migraines   Psoriasis    Vision abnormalities    wears glasses   Past Surgical History:  Procedure Laterality Date   DILATION AND CURETTAGE OF UTERUS     DILATION AND EVACUATION N/A 09/20/2017   Procedure: SUCTION DILATATION AND EVACUATION;  Surgeon: Reklaw Bing, MD;  Location: [facility];  Service: Gynecology;  Laterality: N/A;   ESOPHAGOGASTRODUODENOSCOPY N/A 10/17/2012   Procedure: ESOPHAGOGASTRODUODENOSCOPY (EGD);  Surgeon: Jon Gills, MD;  Location: [facility] OR;  Service: Gastroenterology;  Laterality: N/A;   THERAPEUTIC ABORTION  09/16/2017   Clinic   WISDOM TOOTH EXTRACTION     Patient Active Problem List   Diagnosis Date Noted   Maternal obesity affecting pregnancy, antepartum 11/14/2022   Pain, dental 09/19/2022   [name], antepartum 06/28/2022   Migraine headache 04/25/2022   [name] 01/31/2018   BMI 45.0-49.9, adult 03/15/2016    PCP: none  REFERRING PROVIDER: Ervin,Michael MD  REFERRING DIAG:  back and LE pain during pregnancy  Rationale for Evaluation and Treatment: Rehabilitation  THERAPY DIAG:   Other low back pain  Muscle weakness (generalized)  ONSET DATE: 08/30/2022  SUBJECTIVE:                                                                                                                                                                                           SUBJECTIVE STATEMENT: Pt states that her pain is about the same. She says she tried a couple more of her HEP but it still hurt.  PERTINENT HISTORY:  Pronounced "casual" Due 23-Mar-2023 [redacted] weeks pregnant no restrictions 3 children, ages 3-6 Bipolar, migraine PAIN:  PAIN:  Are you having pain? Yes NPRS scale: 9/10 Pain location: Left back, left thigh to knee level to groin Aggravating factors: sidelying on either side; rolling over; walking; sitting; getting out of bed Relieving factors: stand up briefly and move hips side to side; muscle relaxers don't help  PRECAUTIONS: Other: pregnancy  WEIGHT BEARING RESTRICTIONS: No  FALLS:  Has patient fallen in last 6 months? No  LIVING ENVIRONMENT: Lives with: lives with their family Lives in: House/apartment   PLOF: Independent  PATIENT GOALS: alleviate pain for remainder of pregnancy  OBJECTIVE:    PATIENT SURVEYS:  Modified Oswestry 78% severe/bedbound status    COGNITION: Overall cognitive status: Within functional limits for tasks assessed      LUMBAR ROM:  pain limited in all planes 50% movement loss with flexion, extension and right/left sidebending  LOWER EXTREMITY ROM:   Full LE ROM but painful with all movements  LOWER EXTREMITY MMT:  SLS < 5 sec with pelvic drop on left;  left hip abduction and extension 4-/5;  knee extension 4-/5   FUNCTIONAL TESTS:  Needs UE assist for sit to stand  GAIT: Comments: slow, antalgic  TODAY'S TREATMENT:                                                                                                                              DATE:  12/12/22 Nustep L1 x5 min, PT present to discuss HEP issues Seated  thoracic extension over foam roll x10 reps at different levels of the upper back Standing hip abduction slide x5 reps each side Seated hip adductor squeeze with foam roll x10 reps Breath work seated: inhale and foam roll press down during exhale x10 reps Manual: Lt and Rt hip flexion stretch in sidelying, Lt hip flexion/external rotation stretch STM and massage gun to Lt hip, glutes, rotators   4/10 Nustep x3 min, Pt having difficulty with this due to Lt LE pain Seated posterior pelvic tilts with focus on breathing, stopped with pain onset 8 reps Attempted long sitting hip 90/90 rotation, stopped with pain at 4 reps Seated thoracic extension over foam roll 2x10  Seated deep breathing for relaxation Manual: STM Lt quads,glutes, pt in Rt sidelying  4/4     PATIENT EDUCATION:  Education details: technique with therex Person educated: Patient Education method: Explanation Education comprehension: verbalized understanding  HOME EXERCISE PROGRAM: Access Code: [facility] URL: [email or web address] Date: 11/29/2022 Prepared by: Lavinia Sharps  Exercises - Seated Hip Adductor Stretch (Mirrored)  - 1 x daily - 7 x weekly - 1 sets - 10 reps - 10 hold - Right Standing Lateral Shift Correction at Wall - Hold  - 1 x daily - 7 x weekly - 1 sets - 10 reps - Standing Lumbar Extension at Wall - Forearms  - 1 x daily - 7 x weekly -  1 sets - 10 reps - Standing Hip Flexor Stretch (Mirrored)  - 1 x daily - 7 x weekly - 1 sets - 10 reps - Cat Cow  - 1 x daily - 7 x weekly - 1 sets - 10 reps - Clamshell  - 1 x daily - 7 x weekly - 1 sets - 10 reps  ASSESSMENT:  CLINICAL IMPRESSION: Patient continues to have high levels of Lt hip and back pain, but she appeared to be moving with less difficulty during today's session. Therapist was able to alternate today's exercise positions to avoid high levels of pain. Pt was agreeable to soft tissue mobilization to the hip/buttock region at the end of  today's session. She felt the focus on her lower glutes/deeper musculature and pelvic floor stretches were helpful.  OBJECTIVE IMPAIRMENTS: decreased activity tolerance, difficulty walking, decreased ROM, decreased strength, impaired perceived functional ability, and pain.   ACTIVITY LIMITATIONS: carrying, lifting, bending, sitting, standing, squatting, sleeping, bed mobility, toileting, dressing, locomotion level, and caring for others  PARTICIPATION LIMITATIONS: meal prep, cleaning, laundry, shopping, and community activity  REHAB POTENTIAL: Good  CLINICAL DECISION MAKING: Stable/uncomplicated  EVALUATION COMPLEXITY: Low   GOALS: Goals reviewed with patient? Yes  SHORT TERM GOALS: Target date: 12/27/2022    The patient will demonstrate knowledge of basic self care strategies and exercises to improve mobility for taking care of her children   Baseline: Goal status: INITIAL  2.  The patient will be able to sit for 15 min Baseline:  Goal status: INITIAL  3.  The patient will be able to perform light housework Baseline:  Goal status: INITIAL   LONG TERM GOALS: Target date: 01/24/2023    The patient will be independent in a safe self progression of a home exercise program to promote further recovery of function   Baseline:  Goal status: INITIAL  2.  The patient will be able to walk 15-20 needed for health during and following pregnancy Baseline:  Goal status: INITIAL  3.  The patient will have improved left hip strength to at least 4/5 needed for standing, walking longer distances and descending stairs at home and in the community  Baseline:  Goal status: INITIAL  4.  Modified Oswestry score improved to 65% indicating improved function with less pain Baseline:  Goal status: INITIAL  PLAN:  PT FREQUENCY: 1x/week  PT DURATION: 8 weeks  PLANNED INTERVENTIONS: Therapeutic exercises, Therapeutic activity, Neuromuscular re-education, Patient/Family education, Self  Care, Joint mobilization, Aquatic Therapy, Dry Needling, Spinal mobilization, Cryotherapy, Moist heat, Taping, Manual therapy, and Re-evaluation.  PLAN FOR NEXT SESSION: low level movement; pelvic floor stretches, manual to glutes to decrease tension, gentle transverse/core strengthening   11:08 AM,12/12/22 Donita Brooks PT, DPT [facility] [place]  [phone]

## 2022-12-13 ENCOUNTER — Other Ambulatory Visit (HOSPITAL_COMMUNITY)
Admission: RE | Admit: 2022-12-13 | Discharge: 2022-12-13 | Disposition: A | Discharge status: Home / Self Care | Payer: Medicaid Other | Source: Ambulatory Visit | Attending: Obstetrics & Gynecology | Admitting: Obstetrics & Gynecology

## 2022-12-13 ENCOUNTER — Ambulatory Visit (INDEPENDENT_AMBULATORY_CARE_PROVIDER_SITE_OTHER): Payer: Medicaid Other | Admitting: Obstetrics & Gynecology

## 2022-12-13 VITALS — BP 125/81 | HR 102 | Wt 325.0 lb

## 2022-12-13 DIAGNOSIS — Z3480 Encounter for supervision of other normal pregnancy, unspecified trimester: Secondary | ICD-10-CM | POA: Insufficient documentation

## 2022-12-13 DIAGNOSIS — N898 Other specified noninflammatory disorders of vagina: Secondary | ICD-10-CM | POA: Insufficient documentation

## 2022-12-13 DIAGNOSIS — O26893 Other specified pregnancy related conditions, third trimester: Secondary | ICD-10-CM | POA: Diagnosis present

## 2022-12-13 DIAGNOSIS — Z3A29 29 weeks gestation of pregnancy: Secondary | ICD-10-CM

## 2022-12-13 DIAGNOSIS — O99213 Obesity complicating pregnancy, third trimester: Secondary | ICD-10-CM

## 2022-12-13 DIAGNOSIS — Z6841 Body Mass Index (BMI) 40.0 and over, adult: Secondary | ICD-10-CM

## 2022-12-13 DIAGNOSIS — O99210 Obesity complicating pregnancy, unspecified trimester: Secondary | ICD-10-CM

## 2022-12-13 MED ORDER — CYCLOBENZAPRINE HCL 10 MG PO TABS: 10.0000 mg | ORAL_TABLET | Freq: Three times a day (TID) | ORAL | 2 refills | Status: AC | PRN

## 2022-12-13 NOTE — Progress Notes (Signed)
Pt would like exam today, with swab. Pt states increase d/c and she is "too wet down there".   Pt needs refill on Flexeril.  Pt states she is in PT and Flexeril helps.

## 2022-12-13 NOTE — Progress Notes (Signed)
   PRENATAL VISIT NOTE  Subjective:  Traci Castro is a 27 y.o. [phone] at [redacted]w[redacted]d being seen today for ongoing prenatal care.  She is currently monitored for the following issues for this high-risk pregnancy and has BMI 45.0-49.9, adult; [name]; Migraine headache; [name], antepartum; Pain, dental; and Maternal obesity affecting pregnancy, antepartum on their problem list.  Patient reports vaginal irritation and discharge 2 weeks .  Contractions: Not present. Vag. Bleeding: None.  Movement: Present. Denies leaking of fluid.   The following portions of the patient's history were reviewed and updated as appropriate: allergies, current medications, past family history, past medical history, past social history, past surgical history and problem list.   Objective:   Vitals:   12/13/22 0957  BP: 125/81  Pulse: (!) 102  Weight: (!) 325 lb (147.4 kg)    Fetal Status: Fetal Heart Rate (bpm): 135   Movement: Present     General:  Alert, oriented and cooperative. Patient is in no acute distress.  Skin: Skin is warm and dry. No rash noted.   Cardiovascular: Normal heart rate noted  Respiratory: Normal respiratory effort, no problems with respiration noted  Abdomen: Soft, gravid, appropriate for gestational age.  Pain/Pressure: Present     Pelvic: Cervical exam performed in the presence of a chaperone Dilation: Closed Effacement (%): 10 Station: Ballotable  Extremities: Normal range of motion.     Mental Status: Normal mood and affect. Normal behavior. Normal judgment and thought content.   Assessment and Plan:  Pregnancy: [id number] at [redacted]w[redacted]d 1. BMI 45.0-49.9, adult Body mass index is 55.79 kg/m.   2. Obesity affecting pregnancy, antepartum, unspecified obesity type In testing, f/u growth  3. [name], antepartum No evidence of LOF  Preterm labor symptoms and general obstetric precautions including but not limited to  vaginal bleeding, contractions, leaking of fluid and fetal movement were reviewed in detail with the patient. Please refer to After Visit Summary for other counseling recommendations.   No follow-ups on file.  Future Appointments  Date Time Provider Department Center  12/19/2022  9:30 AM Lavinia Sharps C, PT [facility] None  12/26/2022  9:00 AM Jeanmarie Hubert, PT [facility]  12/27/2022  9:55 AM Hermina Staggers, MD [facility] None  01/01/2023  9:00 AM Jeanmarie Hubert, PT [facility]  01/03/2023  9:45 AM [facility] NURSE [facility]  01/03/2023 10:00 AM [facility] US1 [facility]  01/07/2023  9:00 AM Jeanmarie Hubert, PT [facility]  01/10/2023  9:55 AM Hermina Staggers, MD [facility] None  01/10/2023 11:15 AM [facility] NURSE [facility]  01/10/2023 11:30 AM [facility] US2 [facility]  01/15/2023  9:00 AM Jeanmarie Hubert, PT [facility]  01/17/2023 10:30 AM [facility] NURSE [facility]  01/17/2023 10:45 AM [facility] US4 [facility]  01/22/2023  9:00 AM Jeanmarie Hubert, PT [facility]  01/23/2023 10:35 AM Courtney Paris, Wilmer Floor, CNM [facility] None  01/24/2023 10:30 AM [facility] NURSE [facility]  01/24/2023 10:45 AM [facility] US4 [facility]  01/31/2023 10:30 AM [facility] NURSE [facility]  01/31/2023 10:45 AM [facility] US4 [facility]    Scheryl Darter, MD

## 2022-12-14 LAB — CERVICOVAGINAL ANCILLARY ONLY
Bacterial Vaginitis (gardnerella): NEGATIVE
Candida Glabrata: NEGATIVE
Candida Vaginitis: POSITIVE — AB
Chlamydia: NEGATIVE
Comment: NEGATIVE
Comment: NEGATIVE
Comment: NEGATIVE
Comment: NEGATIVE
Comment: NEGATIVE
Comment: NORMAL
Neisseria Gonorrhea: NEGATIVE
Trichomonas: NEGATIVE

## 2022-12-19 ENCOUNTER — Ambulatory Visit: Payer: Medicaid Other | Admitting: Physical Therapy

## 2022-12-19 DIAGNOSIS — O26892 Other specified pregnancy related conditions, second trimester: Secondary | ICD-10-CM | POA: Diagnosis not present

## 2022-12-19 DIAGNOSIS — M5459 Other low back pain: Secondary | ICD-10-CM

## 2022-12-19 DIAGNOSIS — M6281 Muscle weakness (generalized): Secondary | ICD-10-CM

## 2022-12-19 NOTE — Therapy (Signed)
OUTPATIENT PHYSICAL THERAPY TREATMENT NOTE      Patient Name: Traci Castro MRN: [id number] DOB:1996/01/23, 27 y.o., female Today's Date: 12/19/2022  PCP: none  REFERRING PROVIDER: Nettie Elm MD   END OF SESSION:  PT End of Session - 12/19/22 0933     Visit Number 4    Date for PT Re-Evaluation 01/24/23    Authorization Type [employer] community Medicaid    PT Start Time [phone]    PT Stop Time 1014    PT Time Calculation (min) 41 min    Activity Tolerance Patient limited by pain               Past Medical History:  Diagnosis Date   Anemia    Anxiety    no meds   Bipolar disorder    Blood transfusion without reported diagnosis    for pp anemia not PPH   Depression    no meds   Gastritis    2 bouts   H/O seasonal allergies    Migraine    migraines   Psoriasis    Vision abnormalities    wears glasses   Past Surgical History:  Procedure Laterality Date   DILATION AND CURETTAGE OF UTERUS     DILATION AND EVACUATION N/A 09/20/2017   Procedure: SUCTION DILATATION AND EVACUATION;  Surgeon: [place] Bing, MD;  Location: [facility];  Service: Gynecology;  Laterality: N/A;   ESOPHAGOGASTRODUODENOSCOPY N/A 10/17/2012   Procedure: ESOPHAGOGASTRODUODENOSCOPY (EGD);  Surgeon: Jon Gills, MD;  Location: [facility];  Service: Gastroenterology;  Laterality: N/A;   THERAPEUTIC ABORTION  09/16/2017   Clinic   WISDOM TOOTH EXTRACTION     Patient Active Problem List   Diagnosis Date Noted   Maternal obesity affecting pregnancy, antepartum 11/14/2022   Pain, dental 09/19/2022   [name], antepartum 06/28/2022   Migraine headache 04/25/2022   [name] 01/31/2018   BMI 45.0-49.9, adult 03/15/2016    PCP: none  REFERRING PROVIDER: Nettie Elm MD  REFERRING DIAG:  back and LE pain during pregnancy  Rationale for Evaluation and Treatment: Rehabilitation  THERAPY DIAG:  Other low back pain  Muscle weakness  (generalized)  ONSET DATE: 08/30/2022  SUBJECTIVE:                                                                                                                                                                                           SUBJECTIVE STATEMENT: Will continue with U/S to monitor baby's size.  May be induced early.  Still in a lot of pain.  The doctor told me to take a muscle relaxer before I come to  PT so I took one today.  PERTINENT HISTORY:  Pronounced "casual" Due 2023-02-25 [redacted] weeks pregnant no restrictions 3 children, ages 3-6 Bipolar, migraine PAIN:  PAIN:  Are you having pain? Yes NPRS scale: 7/10 Pain location: Left back, left thigh to knee level to groin Aggravating factors: sidelying on either side; rolling over; walking; sitting; getting out of bed Relieving factors: stand up briefly and move hips side to side; muscle relaxers don't help  PRECAUTIONS: Other: pregnancy  WEIGHT BEARING RESTRICTIONS: No  FALLS:  Has patient fallen in last 6 months? No  LIVING ENVIRONMENT: Lives with: lives with their family Lives in: House/apartment   PLOF: Independent  PATIENT GOALS: alleviate pain for remainder of pregnancy  OBJECTIVE:    PATIENT SURVEYS:  Modified Oswestry 78% severe/bedbound status    COGNITION: Overall cognitive status: Within functional limits for tasks assessed      LUMBAR ROM:  pain limited in all planes 50% movement loss with flexion, extension and right/left sidebending  LOWER EXTREMITY ROM:   Full LE ROM but painful with all movements  LOWER EXTREMITY MMT:  SLS < 5 sec with pelvic drop on left;  left hip abduction and extension 4-/5;  knee extension 4-/5   FUNCTIONAL TESTS:  Needs UE assist for sit to stand  GAIT: Comments: slow, antalgic  TODAY'S TREATMENT:                                                                                                                              DATE:  12/19/22 Nustep L1 x7 min, PT present to  discuss status Standing with left foot in chair with right trunk rotation 10x Standing left hip extension/back lunge 10x Standing lumbar extension at the counter 10x 1/2 kneel with posterior pelvic tilt for hip flexor stretch Sitting on ball with pelvic mobility in all directions Manual: STM and massage gun to Lt hip, glutes, rotators in right sidelying   12/12/22 Nustep L1 x5 min, PT present to discuss HEP issues Seated thoracic extension over foam roll x10 reps at different levels of the upper back Standing hip abduction slide x5 reps each side Seated hip adductor squeeze with foam roll x10 reps Breath work seated: inhale and foam roll press down during exhale x10 reps Manual: Lt and Rt hip flexion stretch in sidelying, Lt hip flexion/external rotation stretch STM and massage gun to Lt hip, glutes, rotators   4/10 Nustep x3 min, Pt having difficulty with this due to Lt LE pain Seated posterior pelvic tilts with focus on breathing, stopped with pain onset 8 reps Attempted long sitting hip 90/90 rotation, stopped with pain at 4 reps Seated thoracic extension over foam roll 2x10  Seated deep breathing for relaxation Manual: STM Lt quads,glutes, pt in Rt sidelying  PATIENT EDUCATION:  Education details: technique with therex Person educated: Patient Education method: Explanation Education comprehension: verbalized understanding  HOME EXERCISE PROGRAM: Access Code: [facility] URL: [email or web address] Date: 11/29/2022 Prepared by: Kennyth Arnold  Vandell Kun  Exercises - Seated Hip Chief Financial Officer (Mirrored)  - 1 x daily - 7 x weekly - 1 sets - 10 reps - 10 hold - Right Standing Lateral Shift Correction at Wall - Hold  - 1 x daily - 7 x weekly - 1 sets - 10 reps - Standing Lumbar Extension at Wall - Forearms  - 1 x daily - 7 x weekly - 1 sets - 10 reps - Standing Hip Flexor Stretch (Mirrored)  - 1 x daily - 7 x weekly - 1 sets - 10 reps - Cat Cow  - 1 x daily - 7 x weekly - 1  sets - 10 reps - Clamshell  - 1 x daily - 7 x weekly - 1 sets - 10 reps  ASSESSMENT:  CLINICAL IMPRESSION: Patient continues to report moderate to severe left buttock pain, groin pain and left thigh pain associated with pregnancy.  She is more tolerant to exercise today than previous visits although she does report taking medication prior to the appointment.  Treatment focus on left hip flexor lengthening and "opening" movements of inguinal region. Some relief reported.  Therapist monitoring response to all interventions and modifying treatment accordingly.  She should benefit significantly from the buoyancy properties of the water through aquatic PT.  OBJECTIVE IMPAIRMENTS: decreased activity tolerance, difficulty walking, decreased ROM, decreased strength, impaired perceived functional ability, and pain.   ACTIVITY LIMITATIONS: carrying, lifting, bending, sitting, standing, squatting, sleeping, bed mobility, toileting, dressing, locomotion level, and caring for others  PARTICIPATION LIMITATIONS: meal prep, cleaning, laundry, shopping, and community activity  REHAB POTENTIAL: Good  CLINICAL DECISION MAKING: Stable/uncomplicated  EVALUATION COMPLEXITY: Low   GOALS: Goals reviewed with patient? Yes  SHORT TERM GOALS: Target date: 12/27/2022    The patient will demonstrate knowledge of basic self care strategies and exercises to improve mobility for taking care of her children   Baseline: Goal status: met 4/24  2.  The patient will be able to sit for 15 min Baseline:  Goal status:ongoing  3.  The patient will be able to perform light housework Baseline:  Goal status:met 4/24  LONG TERM GOALS: Target date: 01/24/2023    The patient will be independent in a safe self progression of a home exercise program to promote further recovery of function   Baseline:  Goal status: INITIAL  2.  The patient will be able to walk 15-20 needed for health during and following  pregnancy Baseline:  Goal status: INITIAL  3.  The patient will have improved left hip strength to at least 4/5 needed for standing, walking longer distances and descending stairs at home and in the community  Baseline:  Goal status: INITIAL  4.  Modified Oswestry score improved to 65% indicating improved function with less pain Baseline:  Goal status: INITIAL  PLAN:  PT FREQUENCY: 1x/week  PT DURATION: 8 weeks  PLANNED INTERVENTIONS: Therapeutic exercises, Therapeutic activity, Neuromuscular re-education, Patient/Family education, Self Care, Joint mobilization, Aquatic Therapy, Dry Needling, Spinal mobilization, Cryotherapy, Moist heat, Taping, Manual therapy, and Re-evaluation.  PLAN FOR NEXT SESSION: aquatic PT to address back, hip and thigh pain associated with pregnancy;  due date in late June but size of baby being monitored for possible early induction;  low level movement for ROM, strength and pain reduction  Lavinia Sharps, PT 12/19/22 7:39 PM Phone: [phone] Fax: [phone]

## 2022-12-21 ENCOUNTER — Telehealth: Payer: Self-pay

## 2022-12-21 NOTE — Telephone Encounter (Signed)
Returned call, pt reporting pelvic pressure. Denies contractions, bleeding, leaking fluid Reports fetal movement. Advised pt to avoid prolonged standing, can take Tylenol, position changes, and to wear maternity belt Advised of abnormal symptoms and to report to mau if changes occur.

## 2022-12-25 NOTE — Therapy (Addendum)
OUTPATIENT PHYSICAL THERAPY TREATMENT NOTE/DISCHARGE SUMMARY      Patient Name: Traci Castro MRN: [id number] DOB:[birth year], 27 y.o., female Today's Date: 12/26/2022  PCP: none  REFERRING PROVIDER: Nettie Elm MD   END OF SESSION:  PT End of Session - 12/26/22 0922     Visit Number 5    Date for PT Re-Evaluation 01/24/23    Authorization Type [employer] community Medicaid    PT Start Time [phone]   Pt arrives late for appointment   PT Stop Time 0945    PT Time Calculation (min) 24 min    Activity Tolerance Patient limited by pain    Behavior During Therapy WFL for tasks assessed/performed                Past Medical History:  Diagnosis Date   Anemia    Anxiety    no meds   Bipolar disorder (HCC)    Blood transfusion without reported diagnosis    for pp anemia not PPH   Depression    no meds   Gastritis    2 bouts   H/O seasonal allergies    Migraine    migraines   Psoriasis    Vision abnormalities    wears glasses   Past Surgical History:  Procedure Laterality Date   DILATION AND CURETTAGE OF UTERUS     DILATION AND EVACUATION N/A 09/20/2017   Procedure: SUCTION DILATATION AND EVACUATION;  Surgeon: Hart Bing, MD;  Location: [facility];  Service: Gynecology;  Laterality: N/A;   ESOPHAGOGASTRODUODENOSCOPY N/A 10/17/2012   Procedure: ESOPHAGOGASTRODUODENOSCOPY (EGD);  Surgeon: Jon Gills, MD;  Location: [facility];  Service: Gastroenterology;  Laterality: N/A;   THERAPEUTIC ABORTION  09/16/2017   Clinic   WISDOM TOOTH EXTRACTION     Patient Active Problem List   Diagnosis Date Noted   Maternal obesity affecting pregnancy, antepartum 11/14/2022   Pain, dental 09/19/2022   [name], antepartum 06/28/2022   Migraine headache 04/25/2022   [name] 01/31/2018   BMI 45.0-49.9, adult (HCC) 03/15/2016    PCP: none  REFERRING PROVIDER: Nettie Elm MD  REFERRING DIAG:  back and LE pain during pregnancy  Rationale  for Evaluation and Treatment: Rehabilitation  THERAPY DIAG:  Other low back pain  Muscle weakness (generalized)  ONSET DATE: 08/30/2022  SUBJECTIVE:                                                                                                                                                                                           SUBJECTIVE STATEMENT: Good response to last land session.   PERTINENT HISTORY:  Pronounced "casual" Due  6/29 [redacted] weeks pregnant no restrictions 3 children, ages 3-6 Bipolar, migraine PAIN:  PAIN:  Are you having pain? Yes NPRS scale: 6/10 Pain location: Left back, left thigh to knee level to groin Aggravating factors: sidelying on either side; rolling over; walking; sitting; getting out of bed Relieving factors: stand up briefly and move hips side to side; muscle relaxers don't help  PRECAUTIONS: Other: pregnancy  WEIGHT BEARING RESTRICTIONS: No  FALLS:  Has patient fallen in last 6 months? No  LIVING ENVIRONMENT: Lives with: lives with their family Lives in: House/apartment   PLOF: Independent  PATIENT GOALS: alleviate pain for remainder of pregnancy  OBJECTIVE:    PATIENT SURVEYS:  Modified Oswestry 78% severe/bedbound status    COGNITION: Overall cognitive status: Within functional limits for tasks assessed      LUMBAR ROM:  pain limited in all planes 50% movement loss with flexion, extension and right/left sidebending  LOWER EXTREMITY ROM:   Full LE ROM but painful with all movements  LOWER EXTREMITY MMT:  SLS < 5 sec with pelvic drop on left;  left hip abduction and extension 4-/5;  knee extension 4-/5   FUNCTIONAL TESTS:  Needs UE assist for sit to stand  GAIT: Comments: slow, antalgic  TODAY'S TREATMENT:                                                                                                                              DATE:  12/26/22  Pt seen for aquatic therapy today.  Treatment took place in water  3.5-4.75 ft in depth at the [facility] pool. Temp of water was 91.  Pt entered/exited the pool via Stairs with hand rail.  *intro to setting *walking forward, back and side stepping 4 widths ea *UE support noodle 4.0 ft: df; pf; Marching; lumbar rotation *L stretch x 3 reps holding x 20s; 4th x with tail wagging * "Old man stretch" *left hamstring, IT band and adductor stretch uisng noodle leaning against wall. *Cycling in 4.8 ft yellow noodle wrapped posteriorly against chest. *return to walking/marching noodle in above position in 4.41ft.  Pt requires the buoyancy and hydrostatic pressure of water for support, and to offload joints by unweighting joint load by at least 50 % in navel deep water and by at least 75-80% in chest to neck deep water.  Viscosity of the water is needed for resistance of strengthening. Water current perturbations provides challenge to standing balance requiring increased core activation.    12/19/22 Nustep L1 x7 min, PT present to discuss status Standing with left foot in chair with right trunk rotation 10x Standing left hip extension/back lunge 10x Standing lumbar extension at the counter 10x 1/2 kneel with posterior pelvic tilt for hip flexor stretch Sitting on ball with pelvic mobility in all directions Manual: STM and massage gun to Lt hip, glutes, rotators in right sidelying   12/12/22 Nustep L1 x5 min, PT present to discuss HEP issues Seated thoracic  extension over foam roll x10 reps at different levels of the upper back Standing hip abduction slide x5 reps each side Seated hip adductor squeeze with foam roll x10 reps Breath work seated: inhale and foam roll press down during exhale x10 reps Manual: Lt and Rt hip flexion stretch in sidelying, Lt hip flexion/external rotation stretch STM and massage gun to Lt hip, glutes, rotators   4/10 Nustep x3 min, Pt having difficulty with this due to Lt LE pain Seated posterior pelvic tilts with focus  on breathing, stopped with pain onset 8 reps Attempted long sitting hip 90/90 rotation, stopped with pain at 4 reps Seated thoracic extension over foam roll 2x10  Seated deep breathing for relaxation Manual: STM Lt quads,glutes, pt in Rt sidelying  PATIENT EDUCATION:  Education details: technique with therex Person educated: Patient Education method: Explanation Education comprehension: verbalized understanding  HOME EXERCISE PROGRAM: Access Code: [facility] URL: [email or web address] Date: 11/29/2022 Prepared by: Traci Castro  Exercises - Seated Hip Adductor Stretch (Mirrored)  - 1 x daily - 7 x weekly - 1 sets - 10 reps - 10 hold - Right Standing Lateral Shift Correction at Wall - Hold  - 1 x daily - 7 x weekly - 1 sets - 10 reps - Standing Lumbar Extension at Wall - Forearms  - 1 x daily - 7 x weekly - 1 sets - 10 reps - Standing Hip Flexor Stretch (Mirrored)  - 1 x daily - 7 x weekly - 1 sets - 10 reps - Cat Cow  - 1 x daily - 7 x weekly - 1 sets - 10 reps - Clamshell  - 1 x daily - 7 x weekly - 1 sets - 10 reps  ASSESSMENT:  CLINICAL IMPRESSION: Pt demonstrats safety and indep in setting with therapist instructing from deck.  She arrives late to appointment limiting our time. She is directed through stretching and load free general movement for decompression and pain relief. LB stretching easy and effective with the assistance of buoyancy gaining desired positions. She does gain relief from LBP with immersion although reports feeling like her back has been used be end. She is a good candidate for aquatic therapy intervention and will benefit from the properties of water to progress towards meeting all goals.    OBJECTIVE IMPAIRMENTS: decreased activity tolerance, difficulty walking, decreased ROM, decreased strength, impaired perceived functional ability, and pain.   ACTIVITY LIMITATIONS: carrying, lifting, bending, sitting, standing, squatting, sleeping, bed  mobility, toileting, dressing, locomotion level, and caring for others  PARTICIPATION LIMITATIONS: meal prep, cleaning, laundry, shopping, and community activity  REHAB POTENTIAL: Good  CLINICAL DECISION MAKING: Stable/uncomplicated  EVALUATION COMPLEXITY: Low   GOALS: Goals reviewed with patient? Yes  SHORT TERM GOALS: Target date: 12/27/2022    The patient will demonstrate knowledge of basic self care strategies and exercises to improve mobility for taking care of her children   Baseline: Goal status: met 4/24  2.  The patient will be able to sit for 15 min Baseline:  Goal status:ongoing  3.  The patient will be able to perform light housework Baseline:  Goal status:met 4/24  LONG TERM GOALS: Target date: 01/24/2023    The patient will be independent in a safe self progression of a home exercise program to promote further recovery of function   Baseline:  Goal status: INITIAL  2.  The patient will be able to walk 15-20 needed for health during and following pregnancy Baseline:  Goal status: INITIAL  3.  The patient will have improved left hip strength to at least 4/5 needed for standing, walking longer distances and descending stairs at home and in the community  Baseline:  Goal status: INITIAL  4.  Modified Oswestry score improved to 65% indicating improved function with less pain Baseline:  Goal status: INITIAL  PLAN:  PT FREQUENCY: 1x/week  PT DURATION: 8 weeks  PLANNED INTERVENTIONS: Therapeutic exercises, Therapeutic activity, Neuromuscular re-education, Patient/Family education, Self Care, Joint mobilization, Aquatic Therapy, Dry Needling, Spinal mobilization, Cryotherapy, Moist heat, Taping, Manual therapy, and Re-evaluation.  PLAN FOR NEXT SESSION: aquatic PT to address back, hip and thigh pain associated with pregnancy;  due date in late June but size of baby being monitored for possible early induction;  low level movement for ROM, strength and pain  reduction  Traci Castro) Traci Castro MPT 12/26/22 9:25 AM Phone: [phone] Fax: [phone]  PHYSICAL THERAPY DISCHARGE SUMMARY  Visits from Start of Care: 5  Current functional level related to goals / functional outcomes: The patient has not returned to PT, possibly secondary to pregnancy issues . Will discharge from PT at this time.   Remaining deficits: As above   Education / Equipment: HEP   Patient goals were not met. Patient is being discharged due to not returning since the last visit.  Traci Castro, PT 03/14/23 8:38 AM Phone: [phone] Fax: [phone]

## 2022-12-26 ENCOUNTER — Encounter (HOSPITAL_BASED_OUTPATIENT_CLINIC_OR_DEPARTMENT_OTHER): Payer: Self-pay | Admitting: Physical Therapy

## 2022-12-26 ENCOUNTER — Ambulatory Visit (HOSPITAL_BASED_OUTPATIENT_CLINIC_OR_DEPARTMENT_OTHER): Payer: Medicaid Other | Attending: Obstetrics and Gynecology | Admitting: Physical Therapy

## 2022-12-26 DIAGNOSIS — M6281 Muscle weakness (generalized): Secondary | ICD-10-CM | POA: Diagnosis present

## 2022-12-26 DIAGNOSIS — M5459 Other low back pain: Secondary | ICD-10-CM | POA: Insufficient documentation

## 2022-12-27 ENCOUNTER — Encounter: Payer: Self-pay | Admitting: Obstetrics and Gynecology

## 2022-12-27 ENCOUNTER — Ambulatory Visit (INDEPENDENT_AMBULATORY_CARE_PROVIDER_SITE_OTHER): Payer: Medicaid Other | Admitting: Obstetrics and Gynecology

## 2022-12-27 VITALS — BP 129/78 | HR 89 | Wt 329.3 lb

## 2022-12-27 DIAGNOSIS — B379 Candidiasis, unspecified: Secondary | ICD-10-CM

## 2022-12-27 DIAGNOSIS — O99210 Obesity complicating pregnancy, unspecified trimester: Secondary | ICD-10-CM

## 2022-12-27 DIAGNOSIS — Z3480 Encounter for supervision of other normal pregnancy, unspecified trimester: Secondary | ICD-10-CM

## 2022-12-27 DIAGNOSIS — O3660X0 Maternal care for excessive fetal growth, unspecified trimester, not applicable or unspecified: Secondary | ICD-10-CM | POA: Insufficient documentation

## 2022-12-27 DIAGNOSIS — O3663X0 Maternal care for excessive fetal growth, third trimester, not applicable or unspecified: Secondary | ICD-10-CM

## 2022-12-27 MED ORDER — TERCONAZOLE 0.4 % VA CREA
1.0000 | TOPICAL_CREAM | Freq: Every day | VAGINAL | 0 refills | Status: DC
Start: 2022-12-27 — End: 2023-01-31

## 2022-12-27 NOTE — Progress Notes (Signed)
Patient presents for ROB. Patient has no concerns today. Needs treatment for yeast infection

## 2022-12-27 NOTE — Progress Notes (Signed)
Subjective:  Traci Castro is a 27 y.o. [phone] at [redacted]w[redacted]d being seen today for ongoing prenatal care.  She is currently monitored for the following issues for this high-risk pregnancy and has BMI 45.0-49.9, adult (HCC); [name]; Migraine headache; [name], antepartum; Pain, dental; Maternal obesity affecting pregnancy, antepartum; and LGA (large for gestational age) fetus affecting management of mother on their problem list.  Patient reports  general discomforts of pregnancy .  Contractions: Not present. Vag. Bleeding: None.  Movement: Present. Denies leaking of fluid.   The following portions of the patient's history were reviewed and updated as appropriate: allergies, current medications, past family history, past medical history, past social history, past surgical history and problem list. Problem list updated.  Objective:   Vitals:   12/27/22 1014  BP: 129/78  Pulse: 89  Weight: (!) 329 lb 4.8 oz (149.4 kg)    Fetal Status: Fetal Heart Rate (bpm): 140   Movement: Present     General:  Alert, oriented and cooperative. Patient is in no acute distress.  Skin: Skin is warm and dry. No rash noted.   Cardiovascular: Normal heart rate noted  Respiratory: Normal respiratory effort, no problems with respiration noted  Abdomen: Soft, gravid, appropriate for gestational age. Pain/Pressure: Present     Pelvic:  Cervical exam deferred        Extremities: Normal range of motion.  Edema: None  Mental Status: Normal mood and affect. Normal behavior. Normal judgment and thought content.   Urinalysis:      Assessment and Plan:  Pregnancy: [id number] at [redacted]w[redacted]d  1. [name], antepartum Stable Interested in water birth, will have her see midwife next visit 2. Obesity affecting pregnancy, antepartum, unspecified obesity type Serial growth scans and antenatal testing as per MFM  3. Excessive fetal growth affecting management of  pregnancy in third trimester, single or unspecified fetus See above  4. Yeast infection Rx sent to pharmacy  Preterm labor symptoms and general obstetric precautions including but not limited to vaginal bleeding, contractions, leaking of fluid and fetal movement were reviewed in detail with the patient. Please refer to After Visit Summary for other counseling recommendations.  Return in about 2 weeks (around 01/10/2023) for OB visit, face to face, any provider.   Hermina Staggers, MD

## 2023-01-01 ENCOUNTER — Ambulatory Visit (HOSPITAL_BASED_OUTPATIENT_CLINIC_OR_DEPARTMENT_OTHER): Payer: Medicaid Other | Admitting: Physical Therapy

## 2023-01-03 ENCOUNTER — Ambulatory Visit: Payer: Medicaid Other

## 2023-01-07 ENCOUNTER — Ambulatory Visit (HOSPITAL_BASED_OUTPATIENT_CLINIC_OR_DEPARTMENT_OTHER): Payer: Medicaid Other | Admitting: Physical Therapy

## 2023-01-09 ENCOUNTER — Encounter: Payer: Self-pay | Admitting: *Deleted

## 2023-01-10 ENCOUNTER — Ambulatory Visit: Payer: Medicaid Other | Attending: Obstetrics and Gynecology

## 2023-01-10 ENCOUNTER — Ambulatory Visit (INDEPENDENT_AMBULATORY_CARE_PROVIDER_SITE_OTHER): Payer: Medicaid Other

## 2023-01-10 ENCOUNTER — Other Ambulatory Visit: Payer: Self-pay

## 2023-01-10 ENCOUNTER — Ambulatory Visit: Payer: Medicaid Other

## 2023-01-10 ENCOUNTER — Encounter: Payer: Medicaid Other | Admitting: Obstetrics and Gynecology

## 2023-01-10 VITALS — BP 128/74 | HR 103

## 2023-01-10 VITALS — BP 138/80 | HR 101 | Wt 330.6 lb

## 2023-01-10 DIAGNOSIS — O99213 Obesity complicating pregnancy, third trimester: Secondary | ICD-10-CM | POA: Insufficient documentation

## 2023-01-10 DIAGNOSIS — Z3A33 33 weeks gestation of pregnancy: Secondary | ICD-10-CM | POA: Insufficient documentation

## 2023-01-10 DIAGNOSIS — O99210 Obesity complicating pregnancy, unspecified trimester: Secondary | ICD-10-CM

## 2023-01-10 DIAGNOSIS — Z148 Genetic carrier of other disease: Secondary | ICD-10-CM | POA: Diagnosis not present

## 2023-01-10 DIAGNOSIS — O3663X0 Maternal care for excessive fetal growth, third trimester, not applicable or unspecified: Secondary | ICD-10-CM | POA: Insufficient documentation

## 2023-01-10 DIAGNOSIS — Z3480 Encounter for supervision of other normal pregnancy, unspecified trimester: Secondary | ICD-10-CM

## 2023-01-10 DIAGNOSIS — O99013 Anemia complicating pregnancy, third trimester: Secondary | ICD-10-CM

## 2023-01-10 DIAGNOSIS — O285 Abnormal chromosomal and genetic finding on antenatal screening of mother: Secondary | ICD-10-CM | POA: Diagnosis not present

## 2023-01-10 DIAGNOSIS — D563 Thalassemia minor: Secondary | ICD-10-CM | POA: Diagnosis not present

## 2023-01-10 DIAGNOSIS — E669 Obesity, unspecified: Secondary | ICD-10-CM

## 2023-01-10 NOTE — Patient Instructions (Signed)
  Contraindications to Waterbirth at [facility]:   History of c-section  Preterm birth less than 37 weeks  Thick, particulate meconium-stained fluid  Maternal fever over 101 degrees Fahrenheit  Heavy bleeding or signs of placental abruption  Pre-eclampsia, Chronic hypertension or Gestational Hypertension  Any abnormal fetal heart rate pattern  If epidural analgesia is utilized during labor  Malpresentation  Multiple gestation pregnancy  Active communicable disease   Significant limitation to mobility  Preexisting Diabetes, A2 Gestational diabetes or Uncontrolled A1 Gestational diabetes  Any other indication based on medical provider discretion  Special Considerations: Some maternal conditions that may become contraindications by provider discretion are history of seizure or syncope, especially without documentation of management or resolution.  The patient will be required to leave the birthing tub if there is a situation warranting a more complete  fetal assessment, continuous fetal monitoring and/or the necessity for the infant to be delivered outside  the tub.   

## 2023-01-10 NOTE — Progress Notes (Signed)
Pt presents for ROB visit. No concerns.  

## 2023-01-10 NOTE — Progress Notes (Signed)
   LOW-RISK PREGNANCY OFFICE VISIT  Patient name: Traci Castro MRN [id number]  Date of birth: 1995-10-25 Chief Complaint:   Routine Prenatal Visit  Subjective:   Traci Castro is a 27 y.o. [phone] female at [redacted]w[redacted]d with an Estimated Date of Delivery: 02/23/23 being seen today for ongoing management of a H-risk pregnancy aeb has BMI 45.0-49.9, adult (HCC); [name], antepartum; Maternal obesity affecting pregnancy, antepartum; and LGA (large for gestational age) fetus affecting management of mother on their problem list.  Patient presents today, with son, with no complaints.  Patient endorses fetal movement. Patient denies abdominal cramping or contractions.  Patient denies vaginal concerns including abnormal discharge, leaking of fluid, and bleeding. No issues with urination, constipation, or diarrhea. Patient reports she has completed her WB course.    Contractions: Not present. Vag. Bleeding: None.  Movement: Present.  Reviewed past medical,surgical, social, obstetrical and family history as well as problem list, medications and allergies.  Objective   Vitals:   01/10/23 0956  BP: 138/80  Pulse: (!) 101  Weight: (!) 330 lb 9.6 oz (150 kg)  Body mass index is 56.75 kg/m.  Total Weight Gain:9.6 oz (0.272 kg)         Physical Examination:   General appearance: Well appearing, and in no distress  Mental status: Alert, oriented to person, place, and time  Skin: Warm & dry  Cardiovascular: Normal heart rate noted  Respiratory: Normal respiratory effort, no distress  Abdomen: Soft, gravid, nontender, LGA with    Pelvic: Cervical exam deferred           Extremities: Edema: Trace  Fetal Status: Fetal Heart Rate (bpm): 144  Movement: Present   No results found for this or any previous visit (from the past 24 hour(s)).  Assessment & Plan:  Low-risk pregnancy of a 27 y.o., [id number] at [redacted]w[redacted]d with an Estimated Date of Delivery: 02/23/23   1. Supervision of  other normal pregnancy, antepartum -Anticipatory guidance for upcoming appts. -Patient to schedule next appt in 3 weeks for an in-person visit. -Reviewed patient plan to give infant to friend, Darien Ramus, for care.  Patient states she is still working things out.   2. Obesity affecting pregnancy, antepartum, unspecified obesity type -BMI 56.75 -TWG 9.6oz  3. [redacted] weeks gestation of pregnancy -Discussed desire for WB. -Informed that currently candidate, but that suspected LGA infant may risk her out with some providers.  -Reviewed risks and contraindications to WB. -Informed that consents to be signed in hospital by CNM.  4. Anemia during pregnancy in third trimester -Taking iron supplement daily. -Instructed to take QOD.     Meds: No orders of the defined types were placed in this encounter.  Labs/procedures today:  Lab Orders  No laboratory test(s) ordered today     Reviewed: Preterm labor symptoms and general obstetric precautions including but not limited to vaginal bleeding, contractions, leaking of fluid and fetal movement were reviewed in detail with the patient.  All questions were answered.  Follow-up: No follow-ups on file.  No orders of the defined types were placed in this encounter.  Cherre Robins MSN, CNM 01/10/2023

## 2023-01-15 ENCOUNTER — Ambulatory Visit (HOSPITAL_BASED_OUTPATIENT_CLINIC_OR_DEPARTMENT_OTHER): Payer: Medicaid Other | Admitting: Physical Therapy

## 2023-01-17 ENCOUNTER — Ambulatory Visit: Payer: Medicaid Other

## 2023-01-17 ENCOUNTER — Telehealth: Payer: Self-pay

## 2023-01-17 ENCOUNTER — Ambulatory Visit: Payer: Medicaid Other | Attending: Maternal & Fetal Medicine | Admitting: *Deleted

## 2023-01-17 ENCOUNTER — Ambulatory Visit: Payer: Medicaid Other | Admitting: *Deleted

## 2023-01-17 VITALS — BP 112/62 | HR 111

## 2023-01-17 DIAGNOSIS — O3660X0 Maternal care for excessive fetal growth, unspecified trimester, not applicable or unspecified: Secondary | ICD-10-CM | POA: Diagnosis present

## 2023-01-17 DIAGNOSIS — O99213 Obesity complicating pregnancy, third trimester: Secondary | ICD-10-CM | POA: Insufficient documentation

## 2023-01-17 DIAGNOSIS — O3663X0 Maternal care for excessive fetal growth, third trimester, not applicable or unspecified: Secondary | ICD-10-CM

## 2023-01-17 DIAGNOSIS — Z3A34 34 weeks gestation of pregnancy: Secondary | ICD-10-CM | POA: Diagnosis not present

## 2023-01-17 NOTE — Telephone Encounter (Signed)
Called pt after being informed that after hours triage line reported an BP of 140/84 and pt feeling dizzy.  Today pt stated that she is currently in route to her u/s and NST appt. Advised pt to inform them of her previous symptoms, pt agreed.

## 2023-01-17 NOTE — Procedures (Signed)
Traci Castro October 15, 1995 [redacted]w[redacted]d  Fetus A Non-Stress Test Interpretation for 01/17/23  Indication:  LGA, M.O.  Fetal Heart Rate A Mode: External Baseline Rate (A): 145 bpm Variability: Moderate Accelerations: 15 x 15 Decelerations: None Multiple birth?: No  Uterine Activity Mode: Toco Contraction Frequency (min): none Resting Tone Palpated: Relaxed  Interpretation (Fetal Testing) Nonstress Test Interpretation: Reactive Overall Impression: Reassuring for gestational age Comments: Tracing reviewed by Dr. Judeth Cornfield

## 2023-01-22 ENCOUNTER — Ambulatory Visit (HOSPITAL_BASED_OUTPATIENT_CLINIC_OR_DEPARTMENT_OTHER): Payer: Medicaid Other | Admitting: Physical Therapy

## 2023-01-23 ENCOUNTER — Other Ambulatory Visit: Payer: Self-pay | Admitting: Advanced Practice Midwife

## 2023-01-23 ENCOUNTER — Ambulatory Visit (INDEPENDENT_AMBULATORY_CARE_PROVIDER_SITE_OTHER): Payer: Medicaid Other | Admitting: Advanced Practice Midwife

## 2023-01-23 ENCOUNTER — Encounter: Payer: Self-pay | Admitting: Advanced Practice Midwife

## 2023-01-23 VITALS — BP 127/92 | HR 98 | Wt 337.6 lb

## 2023-01-23 DIAGNOSIS — O99210 Obesity complicating pregnancy, unspecified trimester: Secondary | ICD-10-CM

## 2023-01-23 DIAGNOSIS — O99810 Abnormal glucose complicating pregnancy: Secondary | ICD-10-CM

## 2023-01-23 DIAGNOSIS — O99013 Anemia complicating pregnancy, third trimester: Secondary | ICD-10-CM

## 2023-01-23 DIAGNOSIS — R030 Elevated blood-pressure reading, without diagnosis of hypertension: Secondary | ICD-10-CM

## 2023-01-23 DIAGNOSIS — R7309 Other abnormal glucose: Secondary | ICD-10-CM

## 2023-01-23 DIAGNOSIS — Z3A35 35 weeks gestation of pregnancy: Secondary | ICD-10-CM

## 2023-01-23 DIAGNOSIS — Z3480 Encounter for supervision of other normal pregnancy, unspecified trimester: Secondary | ICD-10-CM

## 2023-01-23 DIAGNOSIS — O99213 Obesity complicating pregnancy, third trimester: Secondary | ICD-10-CM

## 2023-01-23 DIAGNOSIS — O3663X0 Maternal care for excessive fetal growth, third trimester, not applicable or unspecified: Secondary | ICD-10-CM

## 2023-01-23 LAB — POCT URINALYSIS DIPSTICK
Bilirubin, UA: NEGATIVE
Blood, UA: NEGATIVE
Glucose, UA: NEGATIVE
Ketones, UA: NEGATIVE
Nitrite, UA: NEGATIVE
Protein, UA: POSITIVE — AB
Spec Grav, UA: 1.02 (ref 1.010–1.025)
Urobilinogen, UA: 1 E.U./dL
pH, UA: 8 (ref 5.0–8.0)

## 2023-01-23 NOTE — Progress Notes (Addendum)
PRENATAL VISIT NOTE  Subjective:  Traci Castro is a 27 y.o. [phone] at [redacted]w[redacted]d being seen today for ongoing prenatal care.  She is currently monitored for the following issues for this low-risk pregnancy and has BMI 45.0-49.9, adult (HCC); [name], antepartum; Maternal obesity affecting pregnancy, antepartum; and LGA (large for gestational age) fetus affecting management of mother on their problem list.  Patient reports occasional contractions and difficulty sleeping .  Contractions: Irritability. Vag. Bleeding: Scant.  Movement: Present. Denies leaking of fluid.   The following portions of the patient's history were reviewed and updated as appropriate: allergies, current medications, past family history, past medical history, past social history, past surgical history and problem list.   Objective:   Vitals:   01/23/23 1054 01/23/23 1148  BP: (!) 135/90 (!) 127/92  Pulse: 98 98  Weight: (!) 337 lb 9.6 oz (153.1 kg)     Fetal Status: Fetal Heart Rate (bpm): 177 Fundal Height: 39 cm Movement: Present     General:  Alert, oriented and cooperative. Patient is in no acute distress.  Skin: Skin is warm and dry. No rash noted.   Cardiovascular: Normal heart rate noted  Respiratory: Normal respiratory effort, no problems with respiration noted  Abdomen: Soft, gravid, appropriate for gestational age.  Pain/Pressure: Absent     Pelvic: Cervical exam deferred        Extremities: Normal range of motion.  Edema: None  Mental Status: Normal mood and affect. Normal behavior. Normal judgment and thought content.   Assessment and Plan:  Pregnancy: [id number] at [redacted]w[redacted]d 1. [name], antepartum --Anticipatory guidance about next visits/weeks of pregnancy given.  --Discussed waterbirth with patient.  Pt took class, needs to send certificate.  Pt to upload via Mychart so we can scan into chart.   --Pt BP elevated today, none prior, so does not  meet criteria for [facility] but discussed that if pt has GHTN, waterbirth is contraindicated.   --Also, pt aware that LGA may be a concern, depending on provider discretion and labor progress. Pt states understanding and desires water if possible. Offered hydrotherapy in shower if immersion becomes unavailable.    2. Obesity affecting pregnancy, antepartum, unspecified obesity type --BMI 56  3. Anemia during pregnancy in third trimester --Hgb 9.7 on 4/4  4. Excessive fetal growth affecting management of pregnancy in third trimester, single or unspecified fetus --Previous babies 6-7.5 lbs.   --MFM suggested monitoring glucose, pt does not have meter, meter is costly.  --A1C today. --EFW 99%   5. [redacted] weeks gestation of pregnancy   6. Elevated blood pressure reading without diagnosis of hypertension --No s/sx of PEC --Pt has [place] with MFM tomorrow, 5/30, and appt on Friday 5/31 with Dr Lanae Crumbly for BP check --Reviewed s/sx of PEC, reasons to seek care  - CBC - Comp Met (CMET) - Protein / creatinine ratio, urine   Preterm labor symptoms and general obstetric precautions including but not limited to vaginal bleeding, contractions, leaking of fluid and fetal movement were reviewed in detail with the patient. Please refer to After Visit Summary for other counseling recommendations.   Return for BP check, virtual, on Friday 5/31, then appt next week with midwife preferred.  Future Appointments  Date Time Provider Department Center  01/24/2023 10:30 AM [facility] NURSE [facility]  01/24/2023 10:45 AM [facility] NST [facility]  01/25/2023 10:55 AM Mercado-Ortiz, Lahoma Crocker, DO [facility] None  01/31/2023  9:30 AM [facility] NURSE [facility]  01/31/2023  9:45 AM [facility] NST [facility]  02/01/2023 10:55 AM Brock Bad, MD [facility] None  02/04/2023 10:30 AM [facility] US2 [facility]    Sharen Counter, CNM

## 2023-01-23 NOTE — Progress Notes (Signed)
Pt present for ROB visit. Requesting refill colace.

## 2023-01-24 ENCOUNTER — Ambulatory Visit: Payer: Medicaid Other

## 2023-01-24 ENCOUNTER — Ambulatory Visit: Payer: Medicaid Other | Admitting: *Deleted

## 2023-01-24 ENCOUNTER — Ambulatory Visit: Payer: Medicaid Other | Attending: Maternal & Fetal Medicine | Admitting: *Deleted

## 2023-01-24 VITALS — BP 158/99 | HR 94

## 2023-01-24 DIAGNOSIS — O3660X0 Maternal care for excessive fetal growth, unspecified trimester, not applicable or unspecified: Secondary | ICD-10-CM

## 2023-01-24 DIAGNOSIS — Z3A35 35 weeks gestation of pregnancy: Secondary | ICD-10-CM | POA: Insufficient documentation

## 2023-01-24 DIAGNOSIS — O99213 Obesity complicating pregnancy, third trimester: Secondary | ICD-10-CM | POA: Diagnosis not present

## 2023-01-24 DIAGNOSIS — O99810 Abnormal glucose complicating pregnancy: Secondary | ICD-10-CM | POA: Diagnosis not present

## 2023-01-24 DIAGNOSIS — O3663X0 Maternal care for excessive fetal growth, third trimester, not applicable or unspecified: Secondary | ICD-10-CM

## 2023-01-24 LAB — COMPREHENSIVE METABOLIC PANEL
ALT: 12 IU/L (ref 0–32)
AST: 13 IU/L (ref 0–40)
Albumin/Globulin Ratio: 1.2 (ref 1.2–2.2)
Albumin: 3.3 g/dL — ABNORMAL LOW (ref 4.0–5.0)
Alkaline Phosphatase: 210 IU/L — ABNORMAL HIGH (ref 44–121)
BUN/Creatinine Ratio: 8 — ABNORMAL LOW (ref 9–23)
BUN: 5 mg/dL — ABNORMAL LOW (ref 6–20)
Bilirubin Total: 0.2 mg/dL (ref 0.0–1.2)
CO2: 19 mmol/L — ABNORMAL LOW (ref 20–29)
Calcium: 9 mg/dL (ref 8.7–10.2)
Chloride: 105 mmol/L (ref 96–106)
Creatinine, Ser: 0.59 mg/dL (ref 0.57–1.00)
Globulin, Total: 2.8 g/dL (ref 1.5–4.5)
Glucose: 87 mg/dL (ref 70–99)
Potassium: 4.4 mmol/L (ref 3.5–5.2)
Sodium: 135 mmol/L (ref 134–144)
Total Protein: 6.1 g/dL (ref 6.0–8.5)
eGFR: 127 mL/min/{1.73_m2} (ref >59)

## 2023-01-24 LAB — HEMOGLOBIN A1C
Est. average glucose Bld gHb Est-mCnc: 137 mg/dL
Hgb A1c MFr Bld: 6.4 % — ABNORMAL HIGH (ref 4.8–5.6)

## 2023-01-24 LAB — CBC
Hematocrit: 30.1 % — ABNORMAL LOW (ref 34.0–46.6)
Hemoglobin: 9.2 g/dL — ABNORMAL LOW (ref 11.1–15.9)
MCH: 22.3 pg — ABNORMAL LOW (ref 26.6–33.0)
MCHC: 30.6 g/dL — ABNORMAL LOW (ref 31.5–35.7)
MCV: 73 fL — ABNORMAL LOW (ref 79–97)
Platelets: 342 10*3/uL (ref 150–450)
RBC: 4.12 x10E6/uL (ref 3.77–5.28)
RDW: 15.2 % (ref 11.7–15.4)
WBC: 8.8 10*3/uL (ref 3.4–10.8)

## 2023-01-24 LAB — PROTEIN / CREATININE RATIO, URINE
Creatinine, Urine: 157.6 mg/dL
Protein, Ur: 38.8 mg/dL
Protein/Creat Ratio: 246 mg/g creat — ABNORMAL HIGH (ref 0–200)

## 2023-01-24 NOTE — Procedures (Signed)
Traci Castro 04-01-1996 [redacted]w[redacted]d  Fetus A Non-Stress Test Interpretation for 01/24/23  Indication:  LGA, morbidly obese  Fetal Heart Rate A Mode: External Baseline Rate (A): 135 bpm Variability: Moderate Accelerations: 15 x 15 Decelerations: None Multiple birth?: No  Uterine Activity Mode: Toco Contraction Frequency (min): 1 u/c during NST Contraction Duration (sec): 90 Contraction Quality: Mild Resting Tone Palpated: Relaxed  Interpretation (Fetal Testing) Nonstress Test Interpretation: Reactive Overall Impression: Reassuring for gestational age Comments: Tracing reviewed byDr. Judeth Cornfield

## 2023-01-25 ENCOUNTER — Telehealth (INDEPENDENT_AMBULATORY_CARE_PROVIDER_SITE_OTHER): Payer: Medicaid Other | Admitting: Family Medicine

## 2023-01-25 VITALS — BP 136/64 | HR 90

## 2023-01-25 DIAGNOSIS — O3663X0 Maternal care for excessive fetal growth, third trimester, not applicable or unspecified: Secondary | ICD-10-CM

## 2023-01-25 DIAGNOSIS — Z3A35 35 weeks gestation of pregnancy: Secondary | ICD-10-CM

## 2023-01-25 DIAGNOSIS — Z3480 Encounter for supervision of other normal pregnancy, unspecified trimester: Secondary | ICD-10-CM

## 2023-01-25 DIAGNOSIS — O99613 Diseases of the digestive system complicating pregnancy, third trimester: Secondary | ICD-10-CM

## 2023-01-25 DIAGNOSIS — K5900 Constipation, unspecified: Secondary | ICD-10-CM

## 2023-01-25 MED ORDER — POLYETHYLENE GLYCOL 3350 17 GM/SCOOP PO POWD
17.0000 g | Freq: Every day | ORAL | 0 refills | Status: DC
Start: 2023-01-25 — End: 2023-03-14

## 2023-01-25 NOTE — Progress Notes (Signed)
S.w pt for virtua BP check. BP WNL with home device. Requesting refill of colace.

## 2023-01-25 NOTE — Patient Instructions (Signed)

## 2023-01-25 NOTE — Progress Notes (Signed)
   PRENATAL VISIT NOTE  Subjective:  Traci Castro is a 27 y.o. [phone] at [redacted]w[redacted]d being seen today for ongoing prenatal care.  She is currently monitored for the following issues for this low-risk pregnancy and has BMI 45.0-49.9, adult (HCC); [name], antepartum; Maternal obesity affecting pregnancy, antepartum; and LGA (large for gestational age) fetus affecting management of mother on their problem list.  Patient reports  constipation . Would like medication for this. Dulcolax did not work very well.  Contractions: Not present. Vag. Bleeding: None.  Movement: Present. Denies leaking of fluid.   The following portions of the patient's history were reviewed and updated as appropriate: allergies, current medications, past family history, past medical history, past social history, past surgical history and problem list.   Objective:   Vitals:   01/25/23 1106  BP: 136/64  Pulse: 90    Fetal Status:     Movement: Present     General:  Alert, oriented and cooperative. Patient is in no acute distress.  Skin: Skin is warm and dry. No rash noted.   Cardiovascular: Normal heart rate noted  Respiratory: Normal respiratory effort, no problems with respiration noted  Abdomen: Soft, gravid, appropriate for gestational age.  Pain/Pressure: Absent     Pelvic: Cervical exam deferred        Extremities: Normal range of motion.  Edema: Trace  Mental Status: Normal mood and affect. Normal behavior. Normal judgment and thought content.   Assessment and Plan:  Pregnancy: [id number] at [redacted]w[redacted]d  1. [name], antepartum  2. Constipation during pregnancy in second trimester - polyethylene glycol powder (GLYCOLAX/MIRALAX) 17 GM/SCOOP powder; Take 17 g by mouth daily.  Dispense: 850 g; Refill: 0  3. Excessive fetal growth affecting management of pregnancy in third trimester, single or unspecified fetus   Pt desires WB and LLK, CNM to manage. Discussed  monitoring BP and r/o of WB if BP is elevated. Pt understands.   Preterm labor symptoms and general obstetric precautions including but not limited to vaginal bleeding, contractions, leaking of fluid and fetal movement were reviewed in detail with the patient. Please refer to After Visit Summary for other counseling recommendations.    Future Appointments  Date Time Provider Department Center  01/31/2023  9:30 AM [facility] NURSE [facility]  01/31/2023  9:45 AM [facility] NST [facility]  02/01/2023 10:55 AM Brock Bad, MD [facility] None  02/04/2023 10:30 AM [facility] US2 [facility]    Myrtie Hawk, DO FMOB Fellow, Faculty practice [facility], [facility] 01/25/23  11:14 AM

## 2023-01-29 ENCOUNTER — Telehealth: Payer: Self-pay | Admitting: Advanced Practice Midwife

## 2023-01-29 NOTE — Telephone Encounter (Signed)
Called patient to review A1C results on 01/23/23. Pt with EFW 99%, previous babies smaller, and MFM recommended additional glucose testing.  A1C drawn due to late gestational age and was elevated at 6.4. Pt with hx prediabetes and elevated A1C but passed 2 hour GTT in this pregnancy without difficulty.  Plan to prescribe meter and supplies and for pt to take glucose fasting and 2 hours PP for 1 week and review at next prenatal visit and/or send via Mychart to CNM for review.    Discussed with patient that LGA plus concerns about glucose will likely risk her out of immersion in the tub/waterbirth. Blood sugar values for 1 week may determine treatment plan for DM or plan for IOL as well.  Pt states understanding.

## 2023-01-30 ENCOUNTER — Encounter: Payer: Self-pay | Admitting: Advanced Practice Midwife

## 2023-01-30 ENCOUNTER — Other Ambulatory Visit: Payer: Self-pay | Admitting: *Deleted

## 2023-01-30 DIAGNOSIS — O99810 Abnormal glucose complicating pregnancy: Secondary | ICD-10-CM

## 2023-01-30 MED ORDER — ACCU-CHEK GUIDE VI STRP
ORAL_STRIP | 12 refills | Status: DC
Start: 2023-01-30 — End: 2023-02-06

## 2023-01-30 MED ORDER — ACCU-CHEK SOFTCLIX LANCETS MISC
1.0000 | Freq: Four times a day (QID) | 12 refills | Status: DC
Start: 2023-01-30 — End: 2023-02-06

## 2023-01-30 MED ORDER — ACCU-CHEK GUIDE W/DEVICE KIT
1.0000 | PACK | Freq: Four times a day (QID) | 0 refills | Status: DC
Start: 2023-01-30 — End: 2023-02-06

## 2023-01-30 NOTE — Progress Notes (Signed)
Blood sugar meter and supplies sent per [facility] msg Sharen Counter, CNM. Pt notified.

## 2023-01-30 NOTE — Progress Notes (Signed)
Blood sugar meter and supplies sent. Pt notified via [facility] msg.

## 2023-01-31 ENCOUNTER — Ambulatory Visit (HOSPITAL_BASED_OUTPATIENT_CLINIC_OR_DEPARTMENT_OTHER): Payer: Medicaid Other | Admitting: *Deleted

## 2023-01-31 ENCOUNTER — Ambulatory Visit: Payer: Medicaid Other

## 2023-01-31 ENCOUNTER — Ambulatory Visit: Payer: Medicaid Other | Attending: Obstetrics and Gynecology | Admitting: *Deleted

## 2023-01-31 VITALS — BP 127/78 | HR 107

## 2023-01-31 DIAGNOSIS — O3663X0 Maternal care for excessive fetal growth, third trimester, not applicable or unspecified: Secondary | ICD-10-CM | POA: Diagnosis not present

## 2023-01-31 DIAGNOSIS — O99213 Obesity complicating pregnancy, third trimester: Secondary | ICD-10-CM | POA: Diagnosis not present

## 2023-01-31 DIAGNOSIS — O3660X0 Maternal care for excessive fetal growth, unspecified trimester, not applicable or unspecified: Secondary | ICD-10-CM | POA: Diagnosis not present

## 2023-01-31 DIAGNOSIS — Z3A36 36 weeks gestation of pregnancy: Secondary | ICD-10-CM

## 2023-01-31 NOTE — Procedures (Signed)
Traci Castro Feb 05, 1996 [redacted]w[redacted]d  Fetus A Non-Stress Test Interpretation for 01/31/23  Indication:  LGA, morbidly obese  Fetal Heart Rate A Mode: External Baseline Rate (A): 135 bpm Variability: Moderate Accelerations: 15 x 15 Decelerations: None Multiple birth?: No  Uterine Activity Mode: Toco Contraction Frequency (min): none Resting Tone Palpated: Relaxed  Interpretation (Fetal Testing) Nonstress Test Interpretation: Reactive Overall Impression: Reassuring for gestational age Comments: tracing reviewed byDr. Parke Poisson

## 2023-02-01 ENCOUNTER — Ambulatory Visit (INDEPENDENT_AMBULATORY_CARE_PROVIDER_SITE_OTHER): Payer: Medicaid Other | Admitting: Obstetrics

## 2023-02-01 ENCOUNTER — Other Ambulatory Visit (HOSPITAL_COMMUNITY)
Admission: RE | Admit: 2023-02-01 | Discharge: 2023-02-01 | Disposition: A | Discharge status: Home / Self Care | Payer: Medicaid Other | Source: Ambulatory Visit | Attending: Obstetrics | Admitting: Obstetrics

## 2023-02-01 ENCOUNTER — Encounter: Payer: Self-pay | Admitting: Obstetrics

## 2023-02-01 VITALS — BP 129/80 | HR 101 | Wt 340.8 lb

## 2023-02-01 DIAGNOSIS — O99210 Obesity complicating pregnancy, unspecified trimester: Secondary | ICD-10-CM

## 2023-02-01 DIAGNOSIS — O3663X0 Maternal care for excessive fetal growth, third trimester, not applicable or unspecified: Secondary | ICD-10-CM

## 2023-02-01 DIAGNOSIS — Z3480 Encounter for supervision of other normal pregnancy, unspecified trimester: Secondary | ICD-10-CM | POA: Diagnosis present

## 2023-02-01 DIAGNOSIS — R030 Elevated blood-pressure reading, without diagnosis of hypertension: Secondary | ICD-10-CM

## 2023-02-01 DIAGNOSIS — O99810 Abnormal glucose complicating pregnancy: Secondary | ICD-10-CM

## 2023-02-01 DIAGNOSIS — O99013 Anemia complicating pregnancy, third trimester: Secondary | ICD-10-CM

## 2023-02-01 NOTE — Progress Notes (Addendum)
Subjective:  Traci Castro is a 27 y.o. [phone] at [redacted]w[redacted]d being seen today for ongoing prenatal care.  She is currently monitored for the following issues for this high-risk pregnancy and has BMI 45.0-49.9, adult (HCC); [name], antepartum; Maternal obesity affecting pregnancy, antepartum; and LGA (large for gestational age) fetus affecting management of mother on their problem list.  Patient reports no complaints.  Contractions: Irritability. Vag. Bleeding: None.  Movement: Present. Denies leaking of fluid.   The following portions of the patient's history were reviewed and updated as appropriate: allergies, current medications, past family history, past medical history, past social history, past surgical history and problem list. Problem list updated.  Objective:   Vitals:   02/01/23 1100  BP: 129/80  Pulse: (!) 101  Weight: (!) 340 lb 12.8 oz (154.6 kg)    Fetal Status: Fetal Heart Rate (bpm): 140   Movement: Present     General:  Alert, oriented and cooperative. Patient is in no acute distress.  Skin: Skin is warm and dry. No rash noted.   Cardiovascular: Normal heart rate noted  Respiratory: Normal respiratory effort, no problems with respiration noted  Abdomen: Soft, gravid, appropriate for gestational age. Pain/Pressure: Present     Pelvic:  Cervical exam deferred        Extremities: Normal range of motion.  Edema: Trace  Mental Status: Normal mood and affect. Normal behavior. Normal judgment and thought content.   Urinalysis:      Ultrasound:   [place] MFM OB FOLLOW UP (Accession [id number]) (Order [id number]) Imaging Date: 01/10/2023 Department: [facility] Released By: Ozella Almond T Authorizing: Noralee Space, MD   Exam Status  Status  Final [99]   PACS Intelerad Image Link   Show images for [place] MFM OB FOLLOW UP Study Result  Narrative & Impression   ----------------------------------------------------------------------  OBSTETRICS REPORT                       (Signed Final 01/10/2023 12:36 pm) ---------------------------------------------------------------------- Patient Info  ID #:       [id number]                          D.O.B.:  1996/06/09 (26 yrs)  Name:       Traci Castro             Visit Date: 01/10/2023 11:41 am ---------------------------------------------------------------------- Performed By  Attending:        Braxton Feathers DO       Ref. Address:     [facility]                                                             [facility]                                                             Healthcare  Performed By:     Kris Hartmann,      Location:         [facility]                                     [facility]                                                             [facility]  Referred By:      Warden Fillers MD ---------------------------------------------------------------------- Orders  #  Description                           Code        Ordered By  1  [place] MFM FETAL BPP WO NON               76819.01    RAVI SHANKAR     STRESS  2  [place] MFM OB FOLLOW UP                   76816.01    RAVI [facility] ----------------------------------------------------------------------  #  Order #                     Accession #                Episode #  1  [id number]                   [id number]                 [id number]  2  [id number]                   [id number]                 [id number] ---------------------------------------------------------------------- Indications  [redacted] weeks gestation of pregnancy                Z3A.33  Large for gestational age fetus affecting      O36.60X0  management of mother  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 Cord Insertion:      Previously visualized  Amniotic Fluid  AFI FV:      Within normal limits  AFI Sum(cm)     %  Tile       Largest Pocket(cm)  17.61           65          5.23  RUQ(cm)       RLQ(cm)       LUQ(cm)        LLQ(cm)  2.75          5.12          5.23           4.51 ---------------------------------------------------------------------- Biophysical Evaluation  Amniotic F.V:   Within normal limits       F. Tone:        Observed  F. Movement:    Observed                   Score:          8/8  F. Breathing:   Observed ---------------------------------------------------------------------- Biometry  BPD:      93.3  mm     G. Age:  38w 0d       > 99  %    CI:         79.9   %    70 - 86                                                          FL/HC:      20.3   %    19.4 - 21.8  HC:      329.8  mm     G. Age:  37w 4d         95  %    HC/AC:      0.99        0.96 - 1.11  AC:      334.3  mm     G. Age:  37w 2d       > 99  %    FL/BPD:     71.7   %    71 - 87  FL:       66.9  mm     G. Age:  34w 3d         59  %    FL/AC:      20.0   %    20 - 24  HUM:      61.5  mm     G. Age:  35w 4d       > 95  %  Est. FW:    3014  gm    6 lb 10 oz    > 99  % ---------------------------------------------------------------------- OB History  Blood Type:   A+  Maternal Racial/Ethnic Group:   Black (non-Hispanic)  Gravidity:    5         Term:   2        Prem:   1        SAB:   0  TOP:          1       Ectopic:  0        Living: 3 ---------------------------------------------------------------------- Gestational Age  LMP:           34w 4d        Date:  05/13/22  EDD:   02/17/23  U/S  Today:     36w 6d                                        EDD:   02/01/23  Best:          33w 5d     Det. ByMarcella Dubs         EDD:   02/23/23                                      (07/05/22) ---------------------------------------------------------------------- Anatomy  Cranium:               Appears normal         Aortic Arch:            Previously seen  Cavum:                 Appears normal         Ductal Arch:            Previously seen  Ventricles:            Previously seen        Diaphragm:              Appears normal  Choroid Plexus:        Previously seen        Stomach:                Appears normal, left                                                                        sided  Cerebellum:            Previously seen        Abdomen:                Appears normal  Posterior Fossa:       Previously seen        Abdominal Wall:         Previously seen  Nuchal Fold:           Previously seen        Cord Vessels:           Previously seen  Face:                  Orbits and profile     Kidneys:                Appear normal                         previously seen  Lips:                  Previously seen        Bladder:                Appears normal  Thoracic:  Appears normal         Spine:                  Previously seen  Heart:                 Previously seen        Upper Extremities:      Previously seen  RVOT:                  Previously seen        Lower Extremities:      Previously seen  LVOT:                  Previously seen  Other:  SVC/IVC, 3VV, 3VTV previously visualized. Hands and feet          previously visualized. Heels and 5th digit previously seen. Nasal          bone, lenses, maxilla, mandible and falx previously visualized. ---------------------------------------------------------------------- Comments  The patient is here for a follow-up BPP and growth ultrasound  at 33w 5d for elevated BMI and LGA. EDD: 02/23/2023 dated  by Early Ultrasound   (07/05/22). She has no concerns today.  Sonographic findings  Single intrauterine pregnancy.  Fetal cardiac activity: Observed.  Presentation: Cephalic.  Interval fetal anatomy appears normal..  Fetal biometry shows the estimated fetal weight at the > 99  percentile.  Amniotic fluid volume: Within normal limits. AFI: 17.61 cm.  MVP: 5.23 cm.  Placenta: Posterior Fundal.  BPP: 8/8.  Recommendations  1. Antenatal testing weekly until delivery  2. Growth ultrasounds every 4 weeks until delivery  3. Delivery around [redacted] weeks gestation  4. Consider one week of glucose monitoring  (fasting/postprandial) due to a very LGA fetus despite a  normal GTT ----------------------------------------------------------------------                  Braxton Feathers, DO Electronically Signed Final Report   01/10/2023 12:36 pm ----------------------------------------------------------------------       Assessment and Plan:  Pregnancy: [id number] at [redacted]w[redacted]d  1. [name], antepartum Rx: - Culture, beta strep (group b only) - Cervicovaginal ancillary only( [place])  2. Excessive fetal growth affecting management of pregnancy in third trimester, single or unspecified fetus - interval growth ultrasounds monthly by MFM  3. Prediabetes in mother during pregnancy - self glucose monitoring:  FBS < 94  and   2 hjr PP < 130  4. Anemia during pregnancy in third trimester  5. Elevated blood pressure reading without diagnosis of hypertension - good BP control  6. Obesity affecting pregnancy, antepartum, unspecified obesity type   Term labor symptoms and general obstetric precautions including but not limited to vaginal bleeding, contractions, leaking of fluid and fetal movement were reviewed in detail with the patient. Please refer to After Visit Summary for other counseling recommendations.   Return in about 1 week (around 02/08/2023) for [facility].   Brock Bad,  MD 02/01/23

## 2023-02-01 NOTE — Progress Notes (Signed)
Pt present for ROB visit. Pt c/o "lump" on the bottom of stomach. No other concerns.

## 2023-02-03 ENCOUNTER — Other Ambulatory Visit: Payer: Self-pay

## 2023-02-03 ENCOUNTER — Inpatient Hospital Stay (HOSPITAL_COMMUNITY)
Admission: AD | Admit: 2023-02-03 | Discharge: 2023-02-06 | DRG: 807 | Disposition: A | Discharge status: Home / Self Care | Payer: Medicaid Other | Attending: Obstetrics and Gynecology | Admitting: Obstetrics and Gynecology

## 2023-02-03 ENCOUNTER — Encounter (HOSPITAL_COMMUNITY): Payer: Self-pay | Admitting: Obstetrics and Gynecology

## 2023-02-03 DIAGNOSIS — O3663X0 Maternal care for excessive fetal growth, third trimester, not applicable or unspecified: Secondary | ICD-10-CM | POA: Diagnosis present

## 2023-02-03 DIAGNOSIS — Z30017 Encounter for initial prescription of implantable subdermal contraceptive: Secondary | ICD-10-CM

## 2023-02-03 DIAGNOSIS — O3660X0 Maternal care for excessive fetal growth, unspecified trimester, not applicable or unspecified: Secondary | ICD-10-CM | POA: Diagnosis present

## 2023-02-03 DIAGNOSIS — Z3A37 37 weeks gestation of pregnancy: Secondary | ICD-10-CM

## 2023-02-03 DIAGNOSIS — Z23 Encounter for immunization: Secondary | ICD-10-CM | POA: Diagnosis not present

## 2023-02-03 DIAGNOSIS — O99214 Obesity complicating childbirth: Secondary | ICD-10-CM | POA: Diagnosis present

## 2023-02-03 DIAGNOSIS — O133 Gestational [pregnancy-induced] hypertension without significant proteinuria, third trimester: Secondary | ICD-10-CM

## 2023-02-03 DIAGNOSIS — O99824 Streptococcus B carrier state complicating childbirth: Secondary | ICD-10-CM | POA: Diagnosis present

## 2023-02-03 DIAGNOSIS — O139 Gestational [pregnancy-induced] hypertension without significant proteinuria, unspecified trimester: Secondary | ICD-10-CM | POA: Diagnosis present

## 2023-02-03 DIAGNOSIS — O134 Gestational [pregnancy-induced] hypertension without significant proteinuria, complicating childbirth: Secondary | ICD-10-CM | POA: Diagnosis present

## 2023-02-03 DIAGNOSIS — O99810 Abnormal glucose complicating pregnancy: Secondary | ICD-10-CM

## 2023-02-03 DIAGNOSIS — O99820 Streptococcus B carrier state complicating pregnancy: Secondary | ICD-10-CM | POA: Diagnosis not present

## 2023-02-03 LAB — URINALYSIS, ROUTINE W REFLEX MICROSCOPIC
Bilirubin Urine: NEGATIVE
Glucose, UA: NEGATIVE mg/dL
Hgb urine dipstick: NEGATIVE
Ketones, ur: NEGATIVE mg/dL
Nitrite: NEGATIVE
Protein, ur: NEGATIVE mg/dL
Specific Gravity, Urine: 1.012 (ref 1.005–1.030)
pH: 6 (ref 5.0–8.0)

## 2023-02-03 LAB — TYPE AND SCREEN
ABO/RH(D): A POS
Antibody Screen: NEGATIVE

## 2023-02-03 LAB — CBC
HCT: 32.6 % — ABNORMAL LOW (ref 36.0–46.0)
Hemoglobin: 9.5 g/dL — ABNORMAL LOW (ref 12.0–15.0)
MCH: 22.2 pg — ABNORMAL LOW (ref 26.0–34.0)
MCHC: 29.1 g/dL — ABNORMAL LOW (ref 30.0–36.0)
MCV: 76.3 fL — ABNORMAL LOW (ref 80.0–100.0)
Platelets: 328 10*3/uL (ref 150–400)
RBC: 4.27 MIL/uL (ref 3.87–5.11)
RDW: 16.5 % — ABNORMAL HIGH (ref 11.5–15.5)
WBC: 8.2 10*3/uL (ref 4.0–10.5)
nRBC: 0 % (ref 0.0–0.2)

## 2023-02-03 LAB — COMPREHENSIVE METABOLIC PANEL
ALT: 11 U/L (ref 0–44)
AST: 18 U/L (ref 15–41)
Albumin: 2.5 g/dL — ABNORMAL LOW (ref 3.5–5.0)
Alkaline Phosphatase: 190 U/L — ABNORMAL HIGH (ref 38–126)
Anion gap: 8 (ref 5–15)
BUN: 7 mg/dL (ref 6–20)
CO2: 17 mmol/L — ABNORMAL LOW (ref 22–32)
Calcium: 8.7 mg/dL — ABNORMAL LOW (ref 8.9–10.3)
Chloride: 109 mmol/L (ref 98–111)
Creatinine, Ser: 0.61 mg/dL (ref 0.44–1.00)
GFR, Estimated: >60 mL/min (ref >60)
Glucose, Bld: 93 mg/dL (ref 70–99)
Potassium: 4.2 mmol/L (ref 3.5–5.1)
Sodium: 134 mmol/L — ABNORMAL LOW (ref 135–145)
Total Bilirubin: <0.1 mg/dL — ABNORMAL LOW (ref 0.3–1.2)
Total Protein: 6 g/dL — ABNORMAL LOW (ref 6.5–8.1)

## 2023-02-03 LAB — GLUCOSE, CAPILLARY
Glucose-Capillary: 83 mg/dL (ref 70–99)
Glucose-Capillary: 103 mg/dL — ABNORMAL HIGH (ref 70–99)

## 2023-02-03 LAB — PROTEIN / CREATININE RATIO, URINE
Creatinine, Urine: 34 mg/dL
Protein Creatinine Ratio: 0.29 mg/mg{Cre} — ABNORMAL HIGH (ref 0.00–0.15)
Total Protein, Urine: 10 mg/dL

## 2023-02-03 MED ORDER — OXYTOCIN BOLUS FROM INFUSION
333.0000 mL | Freq: Once | INTRAVENOUS | Status: AC
  Administered 2023-02-04: 333 mL via INTRAVENOUS

## 2023-02-03 MED ORDER — MISOPROSTOL 25 MCG QUARTER TABLET
25.0000 ug | ORAL_TABLET | Freq: Once | ORAL | Status: AC
  Administered 2023-02-03: 25 ug via VAGINAL
  Filled 2023-02-03: qty 1

## 2023-02-03 MED ORDER — SOD CITRATE-CITRIC ACID 500-334 MG/5ML PO SOLN: 30.0000 mL | ORAL | Status: DC | PRN

## 2023-02-03 MED ORDER — LACTATED RINGERS IV SOLN: INTRAVENOUS | Status: DC

## 2023-02-03 MED ORDER — LACTATED RINGERS IV SOLN: 500.0000 mL | INTRAVENOUS | Status: DC | PRN

## 2023-02-03 MED ORDER — SODIUM CHLORIDE 0.9 % IV SOLN
5.0000 10*6.[IU] | Freq: Once | INTRAVENOUS | Status: AC
  Administered 2023-02-03: 5 10*6.[IU] via INTRAVENOUS
  Filled 2023-02-03: qty 5

## 2023-02-03 MED ORDER — OXYTOCIN-SODIUM CHLORIDE 30-0.9 UT/500ML-% IV SOLN
2.5000 [IU]/h | INTRAVENOUS | Status: DC
  Administered 2023-02-04: 2.5 [IU]/h via INTRAVENOUS
  Filled 2023-02-03: qty 500

## 2023-02-03 MED ORDER — PENICILLIN G POT IN DEXTROSE 60000 UNIT/ML IV SOLN
3.0000 10*6.[IU] | INTRAVENOUS | Status: DC
  Administered 2023-02-03 – 2023-02-04 (×4): 3 10*6.[IU] via INTRAVENOUS
  Filled 2023-02-03 (×4): qty 50

## 2023-02-03 MED ORDER — ACETAMINOPHEN 325 MG PO TABS: 650.0000 mg | ORAL_TABLET | ORAL | Status: DC | PRN

## 2023-02-03 MED ORDER — LIDOCAINE HCL (PF) 1 % IJ SOLN: 30.0000 mL | INTRAMUSCULAR | Status: DC | PRN

## 2023-02-03 MED ORDER — ONDANSETRON HCL 4 MG/2ML IJ SOLN
4.0000 mg | Freq: Four times a day (QID) | INTRAMUSCULAR | Status: DC | PRN
  Administered 2023-02-04: 4 mg via INTRAVENOUS
  Filled 2023-02-03 (×2): qty 2

## 2023-02-03 MED ORDER — MISOPROSTOL 50MCG HALF TABLET
50.0000 ug | ORAL_TABLET | Freq: Once | ORAL | Status: AC
  Administered 2023-02-03: 50 ug via ORAL
  Filled 2023-02-03: qty 1

## 2023-02-03 NOTE — MAU Provider Note (Signed)
Chief Complaint:  Diarrhea, Hypertension, and Edema   Event Date/Time   First Provider Initiated Contact with Patient 02/03/23 1418      HPI: Traci Castro is a 27 y.o. [id number] at [redacted]w[redacted]d by early ultrasound who presents to maternity admissions reporting cramping/contractions, loose stools, high blood pressure and swelling.  She started having cramping yesterday, and this morning had 2 watery stools and then 2 loose but not watery stools later in the day.  Her BP at home has been elevated, 140s/90s and her feet/ankles are more swollen than before.  She denies h/a, epigastric pain, or visual disturbances. She denies sick contacts. She reports some wetness in her underwear x 2 weeks, but no gush of fluid or need to wear a pad. She is feeling normal fetal movement.   HPI  Past Medical History: Past Medical History:  Diagnosis Date   Anemia    Anxiety    no meds   Bipolar disorder (HCC)    Blood transfusion without reported diagnosis    for pp anemia not PPH   Depression    no meds   Gastritis    2 bouts   H/O seasonal allergies    Migraine    migraines   Migraine headache 04/25/2022   Pain, dental 09/19/2022   Psoriasis    Vision abnormalities    wears glasses   [name] 01/31/2018    Past obstetric history: OB History  Gravida Para Term Preterm AB Living  5 3 2 1 1 3   SAB IAB Ectopic Multiple Live Births    1   0 3    # Outcome Date GA Lbr Len/2nd Weight Sex Delivery Anes PTL Lv  5 Current           4 Term 07/15/19 [redacted]w[redacted]d 04:38 / 00:01 3379 g M Vag-Spont None  LIV     Birth Comments: wnl  3 Preterm 07/23/18 [redacted]w[redacted]d 01:45 / 00:02 2950 g M Vag-Spont None  LIV     Birth Comments: wnl  2 IAB 2019 [redacted]w[redacted]d         1 Term 05/05/16 [redacted]w[redacted]d 03:05 / 00:50 3351 g M Vag-Spont Local  LIV    Past Surgical History: Past Surgical History:  Procedure Laterality Date   DILATION AND CURETTAGE OF UTERUS     DILATION AND EVACUATION N/A 09/20/2017   Procedure: SUCTION DILATATION AND  EVACUATION;  Surgeon: [place] Bing, MD;  Location: [facility];  Service: Gynecology;  Laterality: N/A;   ESOPHAGOGASTRODUODENOSCOPY N/A 10/17/2012   Procedure: ESOPHAGOGASTRODUODENOSCOPY (EGD);  Surgeon: Jon Gills, MD;  [facility];  Service: Gastroenterology;  Laterality: N/A;   THERAPEUTIC ABORTION  09/16/2017   Clinic   WISDOM TOOTH EXTRACTION      Family History: Family History  Problem Relation Age of Onset   Cholelithiasis Mother    Hyperlipidemia Mother    Hypertension Mother    Mental illness Mother    Thyroid disease Mother    Cholelithiasis Maternal Uncle    Hyperlipidemia Maternal Uncle    Hypertension Maternal Uncle    Cholelithiasis Maternal Grandmother    Arthritis Maternal Grandmother    COPD Maternal Grandmother    Diabetes Maternal Grandmother    Heart disease Maternal Grandmother    Hyperlipidemia Maternal Grandmother    Hypertension Maternal Grandmother    Stroke Maternal Grandmother    Learning disabilities Paternal Grandmother    Mental illness Paternal Grandmother    Cancer Paternal Grandmother    Hyperlipidemia Paternal Actor  Diabetes Paternal Grandfather    Depression Father    Hypertension Father    Ulcers Neg Hx    Alcohol abuse Neg Hx    Asthma Neg Hx    Birth defects Neg Hx    Drug abuse Neg Hx    Early death Neg Hx    Hearing loss Neg Hx    Kidney disease Neg Hx    Mental retardation Neg Hx    Miscarriages / Stillbirths Neg Hx    Vision loss Neg Hx     Social History: Social History   Tobacco Use   Smoking status: Never   Smokeless tobacco: Never  Vaping Use   Vaping Use: Never used  Substance Use Topics   Alcohol use: No   Drug use: No    Allergies:  Allergies  Allergen Reactions   Chlorhexidine Itching and Rash    Severe itching, redness.    Tramadol Nausea And Vomiting    Meds:  Medications Prior to Admission  Medication Sig Dispense Refill Last Dose   cyclobenzaprine (FLEXERIL) 10 MG tablet Take 1  tablet (10 mg total) by mouth 3 (three) times daily as needed for muscle spasms. 30 tablet 2 Past Week   docusate sodium (COLACE) 100 MG capsule Take 1 capsule (100 mg total) by mouth 2 (two) times daily. 10 capsule 0 Past Month   Accu-Chek Softclix Lancets lancets 1 each by Other route 4 (four) times daily. 100 each 12    acetaminophen (TYLENOL) 500 MG tablet Take 1,000 mg by mouth every 6 (six) hours as needed for moderate pain.      Blood Glucose Monitoring Suppl (ACCU-CHEK GUIDE) w/Device KIT 1 Device by Does not apply route 4 (four) times daily. 1 kit 0    Blood Pressure Monitoring (BLOOD PRESSURE KIT) DEVI 1 Device by Does not apply route once a week. 1 each 0    diphenhydrAMINE (BENADRYL) 25 MG tablet Take 1 tablet (25 mg total) by mouth every 6 (six) hours as needed. 30 tablet 0    Elastic Bandages & Supports (COMFORT FIT MATERNITY SUPP MED) MISC Wear daily when ambulating 1 each 0    ferrous sulfate 325 (65 FE) MG EC tablet Take 1 tablet (325 mg total) by mouth every other day. 30 tablet 3    glucose blood (ACCU-CHEK GUIDE) test strip Use to check blood sugars four times a day was instructed 50 each 12    polyethylene glycol powder (GLYCOLAX/MIRALAX) 17 GM/SCOOP powder Take 17 g by mouth daily. 850 g 0    Prenatal Vit-Fe Fumarate-FA (PRENATAL VITAMIN PO) Take 1 tablet by mouth daily.       ROS:  Review of Systems  Constitutional:  Negative for chills, fatigue and fever.  HENT:  Negative for sinus pressure.   Eyes:  Negative for photophobia and visual disturbance.  Respiratory:  Negative for shortness of breath.   Cardiovascular:  Negative for chest pain.  Gastrointestinal:  Positive for abdominal pain and diarrhea. Negative for constipation, nausea and vomiting.  Genitourinary:  Negative for difficulty urinating, dysuria, flank pain, frequency, pelvic pain, vaginal bleeding, vaginal discharge and vaginal pain.  Musculoskeletal:  Positive for back pain. Negative for neck pain.   Neurological:  Negative for dizziness, weakness and headaches.  Psychiatric/Behavioral: Negative.       I have reviewed patient's Past Medical Hx, Surgical Hx, Family Hx, Social Hx, medications and allergies.   Physical Exam  Patient Vitals for the past 24 hrs:  BP Temp Temp  src Pulse Resp SpO2 Height Weight  02/03/23 1331 (!) 144/80 -- -- (!) 102 -- -- -- --  02/03/23 1316 (!) 141/83 -- -- (!) 102 -- -- -- --  02/03/23 1300 (!) 157/85 -- -- (!) 104 -- 100 % -- --  02/03/23 1253 (!) 157/81 98.4 F (36.9 C) Oral (!) 102 19 99 % 5\' 4"  (1.626 m) (!) 156.2 kg   Constitutional: Well-developed, well-nourished female in no acute distress.  Cardiovascular: normal rate Respiratory: normal effort GI: Abd soft, non-tender, gravid appropriate for gestational age.  MS: Extremities nontender, no edema, normal ROM Neurologic: Alert and oriented x 4.  GU: Neg CVAT.  PELVIC EXAM: Cervix pink, visually closed, without lesion, scant white creamy discharge, vaginal walls and external genitalia normal Bimanual exam: Cervix 0/long/high, firm, anterior, neg CMT, uterus nontender, nonenlarged, adnexa without tenderness, enlargement, or mass  Dilation: 2 Effacement (%): Thick Exam by:: Arbie Cookey, CNM  FHT:  Baseline 135, moderate variability, accelerations present, no decelerations Contractions: none on toco or to palpation   Labs: Results for orders placed or performed during the hospital encounter of 02/03/23 (from the past 24 hour(s))  Urinalysis, Routine w reflex microscopic -Urine, Clean Catch     Status: Abnormal   Collection Time: 02/03/23  1:21 PM  Result Value Ref Range   Color, Urine YELLOW YELLOW   APPearance CLOUDY (A) CLEAR   Specific Gravity, Urine 1.012 1.005 - 1.030   pH 6.0 5.0 - 8.0   Glucose, UA NEGATIVE NEGATIVE mg/dL   Hgb urine dipstick NEGATIVE NEGATIVE   Bilirubin Urine NEGATIVE NEGATIVE   Ketones, ur NEGATIVE NEGATIVE mg/dL   Protein, ur NEGATIVE NEGATIVE  mg/dL   Nitrite NEGATIVE NEGATIVE   Leukocytes,Ua MODERATE (A) NEGATIVE   RBC / HPF 0-5 0 - 5 RBC/hpf   WBC, UA 6-10 0 - 5 WBC/hpf   Bacteria, UA RARE (A) NONE SEEN   Squamous Epithelial / HPF 11-20 0 - 5 /HPF   Mucus PRESENT    Amorphous Crystal PRESENT    A/Positive/-- (01/24 1312)  Imaging:  [place] MFM FETAL BPP WO NON STRESS  Result Date: 01/10/2023 ----------------------------------------------------------------------  OBSTETRICS REPORT                       (Signed Final 01/10/2023 12:36 pm) ---------------------------------------------------------------------- Patient Info  ID #:       [id number]                          D.O.B.:  Jun 19, 1996 (26 yrs)  Name:       [name]             Visit Date: 01/10/2023 11:41 am ---------------------------------------------------------------------- Performed By  Attending:        Braxton Feathers DO       Ref. Address:     [facility]                                                             [facility]  [facility]  Performed By:     Kris Hartmann,      Location:         [facility]                                     [facility]  Referred By:      Warden Fillers MD ---------------------------------------------------------------------- Orders  #  Description                           Code        Ordered By  1  [place] MFM FETAL BPP WO NON               76819.01    RAVI SHANKAR     STRESS  2  [place] MFM OB FOLLOW UP                   76816.01    RAVI [facility] ----------------------------------------------------------------------  #  Order #                     Accession #                Episode #  1  [id number]                   [id number]                 [id number]  2  [id number]                   [id number]                  [id number] ---------------------------------------------------------------------- Indications  [redacted] weeks gestation of pregnancy                Z3A.33  Large for gestational age fetus affecting      O36.60X0  management of mother  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 Cord Insertion:      Previously visualized  Amniotic Fluid  AFI FV:      Within normal limits  AFI Sum(cm)     %Tile       Largest Pocket(cm)  17.61           65          5.23  RUQ(cm)       RLQ(cm)       LUQ(cm)        LLQ(cm)  2.75          5.12          5.23           4.51 ---------------------------------------------------------------------- Biophysical Evaluation  Amniotic F.V:   Within normal limits       F. Tone:        Observed  F. Movement:    Observed                   Score:          8/8  F. Breathing:   Observed ---------------------------------------------------------------------- Biometry  BPD:      93.3  mm     G. Age:  38w 0d       > 99  %    CI:         79.9   %    70 - 86                                                          FL/HC:      20.3   %    19.4 - 21.8  HC:      329.8  mm     G. Age:  37w 4d         95  %    HC/AC:      0.99        0.96 - 1.11  AC:      334.3  mm     G. Age:  37w 2d       > 99  %    FL/BPD:     71.7   %    71 - 87  FL:       66.9  mm     G. Age:  34w 3d         59  %    FL/AC:      20.0   %    20 - 24  HUM:      61.5  mm     G. Age:  35w 4d       > 95  %  Est. FW:    3014  gm    6 lb 10 oz    > 99  % ---------------------------------------------------------------------- OB History  Blood Type:   A+  Maternal Racial/Ethnic Group:   Black  (non-Hispanic)  Gravidity:    5         Term:   2        Prem:   1        SAB:   0  TOP:          1       Ectopic:  0  Living: 3 ---------------------------------------------------------------------- Gestational Age  LMP:           34w 4d        Date:  05/13/22                  EDD:   02/17/23  U/S Today:     36w 6d                                        EDD:   02/01/23  Best:          33w 5d     Det. ByMarcella Dubs         EDD:   02/23/23                                      (07/05/22) ---------------------------------------------------------------------- Anatomy  Cranium:               Appears normal         Aortic Arch:            Previously seen  Cavum:                 Appears normal         Ductal Arch:            Previously seen  Ventricles:            Previously seen        Diaphragm:              Appears normal  Choroid Plexus:        Previously seen        Stomach:                Appears normal, left                                                                        sided  Cerebellum:            Previously seen        Abdomen:                Appears normal  Posterior Fossa:       Previously seen        Abdominal Wall:         Previously seen  Nuchal Fold:           Previously seen        Cord Vessels:           Previously seen  Face:                  Orbits and profile     Kidneys:                Appear normal                         previously seen  Lips:  Previously seen        Bladder:                Appears normal  Thoracic:              Appears normal         Spine:                  Previously seen  Heart:                 Previously seen        Upper Extremities:      Previously seen  RVOT:                  Previously seen        Lower Extremities:      Previously seen  LVOT:                  Previously seen  Other:  SVC/IVC, 3VV, 3VTV previously visualized. Hands and feet          previously visualized. Heels and 5th digit previously seen. Nasal          bone, lenses,  maxilla, mandible and falx previously visualized. ---------------------------------------------------------------------- Comments  The patient is here for a follow-up BPP and growth ultrasound  at 33w 5d for elevated BMI and LGA. EDD: 02/23/2023 dated  by Early Ultrasound  (07/05/22). She has no concerns today.  Sonographic findings  Single intrauterine pregnancy.  Fetal cardiac activity: Observed.  Presentation: Cephalic.  Interval fetal anatomy appears normal..  Fetal biometry shows the estimated fetal weight at the > 99  percentile.  Amniotic fluid volume: Within normal limits. AFI: 17.61 cm.  MVP: 5.23 cm.  Placenta: Posterior Fundal.  BPP: 8/8.  Recommendations  1. Antenatal testing weekly until delivery  2. Growth ultrasounds every 4 weeks until delivery  3. Delivery around [redacted] weeks gestation  4. Consider one week of glucose monitoring  (fasting/postprandial) due to a very LGA fetus despite a  normal GTT ----------------------------------------------------------------------                  Braxton Feathers, DO Electronically Signed Final Report   01/10/2023 12:36 pm ----------------------------------------------------------------------  [place] MFM OB FOLLOW UP  Result Date: 01/10/2023 ----------------------------------------------------------------------  OBSTETRICS REPORT                       (Signed Final 01/10/2023 12:36 pm) ---------------------------------------------------------------------- Patient Info  ID #:       [id number]                          D.O.B.:  August 10, 1996 (26 yrs)  Name:       [name]             Visit Date: 01/10/2023 11:41 am ---------------------------------------------------------------------- Performed By  Attending:        Braxton Feathers DO       Ref. Address:     [facility]                                                             [facility]  [facility]  Performed By:     Kris Hartmann,      Location[facility]         [facility]                                     [facility]  Referred By:      Warden Fillers MD ---------------------------------------------------------------------- Orders  #  Description                           Code        Ordered By  1  [place] MFM FETAL BPP WO NON               76819.01    RAVI SHANKAR     STRESS  2  [place] MFM OB FOLLOW UP                   76816.01    RAVI [facility] ----------------------------------------------------------------------  #  Order #                     Accession #                Episode #  1  [id number]                   [id number]                 [id number]  2  [id number]                   [id number]                 [id number] ---------------------------------------------------------------------- Indications  [redacted] weeks gestation of pregnancy                Z3A.33  Large for gestational age fetus affecting      O36.60X0  management of mother  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 Cord Insertion:      Previously visualized  Amniotic Fluid  AFI FV:      Within normal limits  AFI Sum(cm)     %Tile       Largest Pocket(cm)  17.61           65          5.23  RUQ(cm)       RLQ(cm)       LUQ(cm)        LLQ(cm)  2.75          5.12          5.23           4.51 ---------------------------------------------------------------------- Biophysical Evaluation  Amniotic F.V:   Within normal limits       F. Tone:        Observed  F. Movement:    Observed                    Score:          8/8  F. Breathing:   Observed ---------------------------------------------------------------------- Biometry  BPD:      93.3  mm     G. Age:  38w 0d       > 99  %    CI:         79.9   %    70 - 86                                                          FL/HC:      20.3   %    19.4 - 21.8  HC:      329.8  mm     G. Age:  37w 4d         95  %    HC/AC:      0.99        0.96 - 1.11  AC:      334.3  mm     G. Age:  37w 2d       > 99  %    FL/BPD:     71.7   %    71 - 87  FL:       66.9  mm     G. Age:  34w 3d         59  %    FL/AC:      20.0   %    20 - 24  HUM:      61.5  mm     G. Age:  35w 4d       > 95  %  Est. FW:    3014  gm    6 lb 10 oz    > 99  % ---------------------------------------------------------------------- OB History  Blood Type:   A+  Maternal Racial/Ethnic Group:   Black (non-Hispanic)  Gravidity:    5         Term:   2        Prem:   1        SAB:   0  TOP:          1       Ectopic:  0  Living: 3 ---------------------------------------------------------------------- Gestational Age  LMP:           34w 4d        Date:  05/13/22                  EDD:   02/17/23  U/S Today:     36w 6d                                        EDD:   02/01/23  Best:          33w 5d     Det. ByMarcella Dubs         EDD:   02/23/23                                      (07/05/22) ---------------------------------------------------------------------- Anatomy  Cranium:               Appears normal         Aortic Arch:            Previously seen  Cavum:                 Appears normal         Ductal Arch:            Previously seen  Ventricles:            Previously seen        Diaphragm:              Appears normal  Choroid Plexus:        Previously seen        Stomach:                Appears normal, left                                                                        sided  Cerebellum:            Previously seen        Abdomen:                Appears normal  Posterior Fossa:        Previously seen        Abdominal Wall:         Previously seen  Nuchal Fold:           Previously seen        Cord Vessels:           Previously seen  Face:                  Orbits and profile     Kidneys:                Appear normal                         previously seen  Lips:  Previously seen        Bladder:                Appears normal  Thoracic:              Appears normal         Spine:                  Previously seen  Heart:                 Previously seen        Upper Extremities:      Previously seen  RVOT:                  Previously seen        Lower Extremities:      Previously seen  LVOT:                  Previously seen  Other:  SVC/IVC, 3VV, 3VTV previously visualized. Hands and feet          previously visualized. Heels and 5th digit previously seen. Nasal          bone, lenses, maxilla, mandible and falx previously visualized. ---------------------------------------------------------------------- Comments  The patient is here for a follow-up BPP and growth ultrasound  at 33w 5d for elevated BMI and LGA. EDD: 02/23/2023 dated  by Early Ultrasound  (07/05/22). She has no concerns today.  Sonographic findings  Single intrauterine pregnancy.  Fetal cardiac activity: Observed.  Presentation: Cephalic.  Interval fetal anatomy appears normal..  Fetal biometry shows the estimated fetal weight at the > 99  percentile.  Amniotic fluid volume: Within normal limits. AFI: 17.61 cm.  MVP: 5.23 cm.  Placenta: Posterior Fundal.  BPP: 8/8.  Recommendations  1. Antenatal testing weekly until delivery  2. Growth ultrasounds every 4 weeks until delivery  3. Delivery around [redacted] weeks gestation  4. Consider one week of glucose monitoring  (fasting/postprandial) due to a very LGA fetus despite a  normal GTT ----------------------------------------------------------------------                  Braxton Feathers, DO Electronically Signed Final Report   01/10/2023 12:36 pm  ----------------------------------------------------------------------   MAU Course/MDM: Orders Placed This Encounter  Procedures   Culture, OB Urine   Urinalysis, Routine w reflex microscopic -Urine, Clean Catch   CBC   RPR   Protein / creatinine ratio, urine   Comprehensive metabolic panel   Diet clear liquid Room service appropriate? Yes; Fluid consistency: Thin   Vitals signs per unit policy   Notify physician (specify)   Fetal monitoring per unit policy   Activity as tolerated   Cervical Exam   Measure blood pressure post delivery every 15 min x 1 hour then every 30 min x 1 hour   Fundal check post delivery every 15 min x 1 hour then every 30 min x 1 hour   Apply Labor & Delivery Care Plan   If Rapid HIV test positive or known HIV positive: initiate AZT orders   May in and out cath x 2 for inability to void   Insert urethral catheter X 1 PRN If Coude Catheter is chosen, qualified resources by campus can be found in the clinical skills nursing procedure for Coude Catheter 1. If straight catheterized > 2 times or patient unable to void post epidural plac...   Refer to Sidebar Report Urinary (Foley) Catheter Indications   Refer to Sidebar Report  Post Indwelling Urinary Catheter Removal and Intervention Guidelines   Discontinue foley prior to vaginal delivery   Initiate Oral Care Protocol   Initiate Carrier Fluid Protocol   Full code   Type and screen [facility]   Insert and maintain IV Line   Admit to Inpatient (patient's expected length of stay will be greater than 2 midnights or inpatient only procedure)    Meds ordered this encounter  Medications   lactated ringers infusion   oxytocin (PITOCIN) IV BOLUS FROM BAG   oxytocin (PITOCIN) IV infusion 30 units in NS 500 mL - Premix   lactated ringers infusion 500-1,000 mL   acetaminophen (TYLENOL) tablet 650 mg   ondansetron (ZOFRAN) injection 4 mg   sodium citrate-citric acid (ORACIT) solution 30 mL    lidocaine (PF) (XYLOCAINE) 1 % injection 30 mL   FOLLOWED BY Linked Order Group    penicillin G potassium 5 Million Units in sodium chloride 0.9 % 250 mL IVPB     Order Specific Question:   Antibiotic Indication:     Answer:   Group B Strep Prophylaxis    penicillin G potassium 3 Million Units in dextrose 50mL IVPB     Order Specific Question:   Antibiotic Indication:     Answer:   Group B Strep Prophylaxis     NST reviewed and reactive Pt meets criteria for GHTN with previously elevated BPs in third trimester No s/sx of PEC, labs pending No evidence of active labor with 2 cm/long cervix Vertex position noted on exam Given GHTN and  LGA with impaired glucose tolerance, plan for IOL today See H&P    Assessment: 1. Gestational hypertension, third trimester   2. Impaired glucose in pregnancy, antepartum     Plan: Admit to L&D  Sharen Counter Certified Nurse-Midwife 02/03/2023 3:01 PM

## 2023-02-03 NOTE — Progress Notes (Signed)
LABOR PROGRESS NOTE  Traci Castro is a 27 y.o. [phone] at [redacted]w[redacted]d  admitted for IOL for gestational hypertension versus preeclampsia  Subjective: Doing well, no acute concerns  Objective: BP (!) 124/57   Pulse 97   Temp 97.8 F (36.6 C) (Oral)   Resp 16   Ht 5\' 4"  (1.626 m)   Wt (!) 156.2 kg   LMP 05/13/2022   SpO2 100%   BMI 59.12 kg/m  or  Vitals:   02/03/23 1331 02/03/23 1735 02/03/23 2014 02/03/23 2017  BP: (!) 144/80 (!) 143/79  (!) 124/57  Pulse: (!) 102 95  97  Resp:   16   Temp:   97.8 F (36.6 C)   TempSrc:   Oral   SpO2:      Weight:      Height:       Dilation: 2.5 Effacement (%): Thick Station: -3 Exam by:: Derrel Nip, MD FHT: baseline rate 135, moderate varibility, + acel, no decel Toco: Occasional  Labs: Lab Results  Component Value Date   WBC 8.2 02/03/2023   HGB 9.5 (L) 02/03/2023   HCT 32.6 (L) 02/03/2023   MCV 76.3 (L) 02/03/2023   PLT 328 02/03/2023    Patient Active Problem List   Diagnosis Date Noted   Gestational hypertension 02/03/2023   LGA (large for gestational age) fetus affecting management of mother 12/27/2022   Maternal obesity affecting pregnancy, antepartum 11/14/2022   [name], antepartum 06/28/2022   BMI 45.0-49.9, adult (HCC) 03/15/2016    Assessment / Plan: 27 y.o. [id number] at [redacted]w[redacted]d here for IOL for gestational hypertension  Labor: Induction started with Foley balloon placement and Cytotec 50 oral 25 vaginal.  Plan on AROM when able Fetal Wellbeing: Category 1 Pain Control: Per patient request Anticipated MOD: Vaginal Gestational hypertension: Mild range elevated BPs.  PC ratio 0.29   Derrel Nip, MD  OB Fellow  02/03/2023, 11:22 PM

## 2023-02-03 NOTE — H&P (Addendum)
OBSTETRIC ADMISSION HISTORY AND PHYSICAL  Traci Castro is a 27 y.o. female [phone] with IUP at [redacted]w[redacted]d by early [place] presenting for IOL 2/2 gHTN v. preE. She reports +FMs, No LOF, no VB, no blurry vision, headaches or peripheral edema, and RUQ pain.  She plans on breast feeding. She is unsure what she would like for birth control.  She received her prenatal care at  [facility]    Dating: By 6 wk [place] --->  Estimated Date of Delivery: 02/23/23  Sono:    @[redacted]w[redacted]d , CWD, normal anatomy, cephalic presentation, posterior fundal lie, 3014g, 99% EFW  Prenatal History/Complications:  -gHTN v. preE -LGA  Past Medical History: Past Medical History:  Diagnosis Date   Anemia    Anxiety    no meds   Bipolar disorder (HCC)    Blood transfusion without reported diagnosis    for pp anemia not PPH   Depression    no meds   Gastritis    2 bouts   H/O seasonal allergies    Migraine    migraines   Migraine headache 04/25/2022   Pain, dental 09/19/2022   Psoriasis    Vision abnormalities    wears glasses   [name] 01/31/2018   Past Surgical History: Past Surgical History:  Procedure Laterality Date   DILATION AND CURETTAGE OF UTERUS     DILATION AND EVACUATION N/A 09/20/2017   Procedure: SUCTION DILATATION AND EVACUATION;  Surgeon:  Bing, MD;  Location: [facility];  Service: Gynecology;  Laterality: N/A;   ESOPHAGOGASTRODUODENOSCOPY N/A 10/17/2012   Procedure: ESOPHAGOGASTRODUODENOSCOPY (EGD);  Surgeon: Jon Gills, MD;  Location: [facility];  Service: Gastroenterology;  Laterality: N/A;   THERAPEUTIC ABORTION  09/16/2017   Clinic   WISDOM TOOTH EXTRACTION     Obstetrical History: OB History     Gravida  5   Para  3   Term  2   Preterm  1   AB  1   Living  3      SAB      IAB  1   Ectopic      Multiple  0   Live Births  3          Social History Social History   Socioeconomic History   Marital status: Single    Spouse name: Not on file   Number of  children: Not on file   Years of education: Not on file   Highest education level: Not on file  Occupational History   Not on file  Tobacco Use   Smoking status: Never   Smokeless tobacco: Never  Vaping Use   Vaping Use: Never used  Substance and Sexual Activity   Alcohol use: No   Drug use: No   Sexual activity: Yes    Birth control/protection: None  Other Topics Concern   Not on file  Social History Narrative   10th grade   Social Determinants of Health   Financial Resource Strain: Not on file  Food Insecurity: No Food Insecurity (02/03/2023)   Hunger Vital Sign    Worried About Running Out of Food in the Last Year: Never true    Ran Out of Food in the Last Year: Never true  Transportation Needs: No Transportation Needs (02/03/2023)   PRAPARE - Administrator, Civil Service (Medical): No    Lack of Transportation (Non-Medical): No  Physical Activity: Not on file  Stress: Not on file  Social Connections: Not on file  Family History: Family History  Problem Relation Age of Onset   Cholelithiasis Mother    Hyperlipidemia Mother    Hypertension Mother    Mental illness Mother    Thyroid disease Mother    Cholelithiasis Maternal Uncle    Hyperlipidemia Maternal Uncle    Hypertension Maternal Uncle    Cholelithiasis Maternal Grandmother    Arthritis Maternal Grandmother    COPD Maternal Grandmother    Diabetes Maternal Grandmother    Heart disease Maternal Grandmother    Hyperlipidemia Maternal Grandmother    Hypertension Maternal Grandmother    Stroke Maternal Grandmother    Learning disabilities Paternal Grandmother    Mental illness Paternal Grandmother    Cancer Paternal Grandmother    Hyperlipidemia Paternal Grandfather    Diabetes Paternal Grandfather    Depression Father    Hypertension Father    Ulcers Neg Hx    Alcohol abuse Neg Hx    Asthma Neg Hx    Birth defects Neg Hx    Drug abuse Neg Hx    Early death Neg Hx    Hearing loss Neg  Hx    Kidney disease Neg Hx    Mental retardation Neg Hx    Miscarriages / Stillbirths Neg Hx    Vision loss Neg Hx    Allergies: Allergies  Allergen Reactions   Chlorhexidine Itching and Rash    Severe itching, redness.    Tramadol Nausea And Vomiting   Medications Prior to Admission  Medication Sig Dispense Refill Last Dose   acetaminophen (TYLENOL) 500 MG tablet Take 1,000 mg by mouth every 6 (six) hours as needed for moderate pain.   Past Month   Blood Glucose Monitoring Suppl (ACCU-CHEK GUIDE) w/Device KIT 1 Device by Does not apply route 4 (four) times daily. 1 kit 0 02/03/2023   Blood Pressure Monitoring (BLOOD PRESSURE KIT) DEVI 1 Device by Does not apply route once a week. 1 each 0 02/03/2023   cyclobenzaprine (FLEXERIL) 10 MG tablet Take 1 tablet (10 mg total) by mouth 3 (three) times daily as needed for muscle spasms. 30 tablet 2 Past Week   docusate sodium (COLACE) 100 MG capsule Take 1 capsule (100 mg total) by mouth 2 (two) times daily. 10 capsule 0 Past Month   ferrous sulfate 325 (65 FE) MG EC tablet Take 1 tablet (325 mg total) by mouth every other day. 30 tablet 3 Past Week   glucose blood (ACCU-CHEK GUIDE) test strip Use to check blood sugars four times a day was instructed 50 each 12 02/03/2023   polyethylene glycol powder (GLYCOLAX/MIRALAX) 17 GM/SCOOP powder Take 17 g by mouth daily. 850 g 0 Past Month   Prenatal Vit-Fe Fumarate-FA (PRENATAL VITAMIN PO) Take 1 tablet by mouth daily.   02/03/2023   Accu-Chek Softclix Lancets lancets 1 each by Other route 4 (four) times daily. 100 each 12    diphenhydrAMINE (BENADRYL) 25 MG tablet Take 1 tablet (25 mg total) by mouth every 6 (six) hours as needed. 30 tablet 0    Elastic Bandages & Supports (COMFORT FIT MATERNITY SUPP MED) MISC Wear daily when ambulating 1 each 0    Review of Systems   All systems reviewed and negative except as stated in HPI  Blood pressure (!) 144/80, pulse (!) 102, temperature 98.4 F (36.9 C),  temperature source Oral, resp. rate 19, height 5\' 4"  (1.626 m), weight (!) 156.2 kg, last menstrual period 05/13/2022, SpO2 100 %. General appearance: alert and no distress Lungs: normal  effort Heart: mild tachycardia noted Abdomen: gravid Extremities: No LE edema Presentation: unsure, will need to confirm VTX prior to cytotec placement Fetal monitoringBaseline: 125 bpm, Variability: Good {> 6 bpm), Accelerations: Reactive, and Decelerations: Absent Uterine activityNone Dilation: 2 Effacement (%): Thick Exam by:: Arbie Cookey, CNM  Prenatal labs: ABO, Rh: --/--/A POS (06/09 1350) Antibody: NEG (06/09 1350) Rubella: 1.34 (01/24 1312) RPR: Non Reactive (04/04 0843)  HBsAg: Negative (01/24 1312)  HIV: Non Reactive (04/04 0843)  GBS:    1 hr Glucola 140 Genetic screening  Neg, LR Anatomy US female, wnl LGA  Prenatal Transfer Tool  Maternal Diabetes: No Genetic Screening: Normal Maternal Ultrasounds/Referrals: Other: LGA Fetal Ultrasounds or other Referrals:  None Maternal Substance Abuse:  No Significant Maternal Medications:  None Significant Maternal Lab Results:  Other:  GBS pending Number of Prenatal Visits:greater than 3 verified prenatal visits Other Comments:   Suspected/ possible GDM  Results for orders placed or performed during the hospital encounter of 02/03/23 (from the past 24 hour(s))  Urinalysis, Routine w reflex microscopic -Urine, Clean Catch   Collection Time: 02/03/23  1:21 PM  Result Value Ref Range   Color, Urine YELLOW YELLOW   APPearance CLOUDY (A) CLEAR   Specific Gravity, Urine 1.012 1.005 - 1.030   pH 6.0 5.0 - 8.0   Glucose, UA NEGATIVE NEGATIVE mg/dL   Hgb urine dipstick NEGATIVE NEGATIVE   Bilirubin Urine NEGATIVE NEGATIVE   Ketones, ur NEGATIVE NEGATIVE mg/dL   Protein, ur NEGATIVE NEGATIVE mg/dL   Nitrite NEGATIVE NEGATIVE   Leukocytes,Ua MODERATE (A) NEGATIVE   RBC / HPF 0-5 0 - 5 RBC/hpf   WBC, UA 6-10 0 - 5 WBC/hpf   Bacteria,  UA RARE (A) NONE SEEN   Squamous Epithelial / HPF 11-20 0 - 5 /HPF   Mucus PRESENT    Amorphous Crystal PRESENT   Comprehensive metabolic panel   Collection Time: 02/03/23  1:50 PM  Result Value Ref Range   Sodium 134 (L) 135 - 145 mmol/L   Potassium 4.2 3.5 - 5.1 mmol/L   Chloride 109 98 - 111 mmol/L   CO2 17 (L) 22 - 32 mmol/L   Glucose, Bld 93 70 - 99 mg/dL   BUN 7 6 - 20 mg/dL   Creatinine, Ser 1.61 0.44 - 1.00 mg/dL   Calcium 8.7 (L) 8.9 - 10.3 mg/dL   Total Protein 6.0 (L) 6.5 - 8.1 g/dL   Albumin 2.5 (L) 3.5 - 5.0 g/dL   AST 18 15 - 41 U/L   ALT 11 0 - 44 U/L   Alkaline Phosphatase 190 (H) 38 - 126 U/L   Total Bilirubin <0.1 (L) 0.3 - 1.2 mg/dL   GFR, Estimated >09 >60 mL/min   Anion gap 8 5 - 15  CBC   Collection Time: 02/03/23  1:50 PM  Result Value Ref Range   WBC 8.2 4.0 - 10.5 K/uL   RBC 4.27 3.87 - 5.11 MIL/uL   Hemoglobin 9.5 (L) 12.0 - 15.0 g/dL   HCT 45.4 (L) 09.8 - 11.9 %   MCV 76.3 (L) 80.0 - 100.0 fL   MCH 22.2 (L) 26.0 - 34.0 pg   MCHC 29.1 (L) 30.0 - 36.0 g/dL   RDW 14.7 (H) 82.9 - 56.2 %   Platelets 328 150 - 400 K/uL   nRBC 0.0 0.0 - 0.2 %  Type and screen [facility]   Collection Time: 02/03/23  1:50 PM  Result Value Ref Range  ABO/RH(D) A POS    Antibody Screen NEG    Sample Expiration      02/06/2023,2359 Performed at [facility], [address], [place], [place] [address]     Patient Active Problem List   Diagnosis Date Noted   Gestational hypertension 02/03/2023   LGA (large for gestational age) fetus affecting management of mother 12/27/2022   Maternal obesity affecting pregnancy, antepartum 11/14/2022   [name], antepartum 06/28/2022   BMI 45.0-49.9, adult (HCC) 03/15/2016    Assessment/Plan:  Traci Castro is a 27 y.o. [id number] at [redacted]w[redacted]d here for IOL 2/2 preE v. gHTN.   #Labor: Start with cyto 50/25. Consider starting pitocin v. AROM if in good contraction pattern at next  cervical exam.  #Pain: Maternal support #FWB: Cat I  #ID: GBS pending, hx of GBS+ #MOF: Breast #MOC: Nexplanon #Circ: yes  gHTN v. preE BP mild range. Asymptomatic.  -preE labs pending  LGA  EFW 3014g, 99% @33wks --> extrapolates to 3921g, 8lbs 10oz. She is tested to 7lbs 7oz.  -Avoid vacuum delivery and continue to monitor descent -glu on CMP 9.3. Monitor CBGs Q4H x2 (concern for elevated glucose in pregnancy); discontinue if in range. Continue to monitor if elevated.   Ettamae Barkett Autry-Lott, DO  02/03/2023, 4:08 PM

## 2023-02-03 NOTE — MAU Note (Signed)
...  Traci Castro is a 27 y.o. at [redacted]w[redacted]d here in MAU reporting: Loose stools, HBP, and bilateral swelling of feet and ankles for the past 2-3 days. She is also reporting a left sided HA and rates it a 7/10 that began this morning. Denies VB. Reports her underwear have been wet for the past couple of weeks. DFM. Has not felt any movement all day.  Has not taken anything for her pains.  Onset of complaint: 2-3 days Pain score:  7/10 HA 10/10 feet and ankles  FHT: 145 initial external Lab orders placed from triage:  UA

## 2023-02-04 ENCOUNTER — Ambulatory Visit: Payer: Medicaid Other

## 2023-02-04 ENCOUNTER — Encounter (HOSPITAL_COMMUNITY): Payer: Self-pay | Admitting: Obstetrics and Gynecology

## 2023-02-04 ENCOUNTER — Other Ambulatory Visit: Payer: Self-pay | Admitting: Obstetrics

## 2023-02-04 DIAGNOSIS — O3663X0 Maternal care for excessive fetal growth, third trimester, not applicable or unspecified: Secondary | ICD-10-CM

## 2023-02-04 DIAGNOSIS — O134 Gestational [pregnancy-induced] hypertension without significant proteinuria, complicating childbirth: Secondary | ICD-10-CM

## 2023-02-04 DIAGNOSIS — Z3A37 37 weeks gestation of pregnancy: Secondary | ICD-10-CM

## 2023-02-04 DIAGNOSIS — O2343 Unspecified infection of urinary tract in pregnancy, third trimester: Secondary | ICD-10-CM

## 2023-02-04 DIAGNOSIS — O99820 Streptococcus B carrier state complicating pregnancy: Secondary | ICD-10-CM

## 2023-02-04 LAB — RPR: RPR Ser Ql: NONREACTIVE

## 2023-02-04 LAB — CULTURE, OB URINE: Special Requests: NORMAL

## 2023-02-04 LAB — CERVICOVAGINAL ANCILLARY ONLY
Bacterial Vaginitis (gardnerella): POSITIVE — AB
Candida Glabrata: NEGATIVE
Candida Vaginitis: POSITIVE — AB
Chlamydia: NEGATIVE
Comment: NEGATIVE
Comment: NEGATIVE
Comment: NEGATIVE
Comment: NEGATIVE
Comment: NEGATIVE
Comment: NORMAL
Neisseria Gonorrhea: NEGATIVE
Trichomonas: NEGATIVE

## 2023-02-04 LAB — CULTURE, BETA STREP (GROUP B ONLY): Strep Gp B Culture: POSITIVE — AB

## 2023-02-04 MED ORDER — FUROSEMIDE 20 MG PO TABS
20.0000 mg | ORAL_TABLET | Freq: Every day | ORAL | Status: DC
  Administered 2023-02-05 – 2023-02-06 (×2): 20 mg via ORAL
  Filled 2023-02-04 (×2): qty 1

## 2023-02-04 MED ORDER — TERBUTALINE SULFATE 1 MG/ML IJ SOLN: 0.2500 mg | Freq: Once | INTRAMUSCULAR | Status: DC | PRN

## 2023-02-04 MED ORDER — BENZOCAINE-MENTHOL 20-0.5 % EX AERO: 1.0000 | INHALATION_SPRAY | CUTANEOUS | Status: DC | PRN

## 2023-02-04 MED ORDER — OXYTOCIN-SODIUM CHLORIDE 30-0.9 UT/500ML-% IV SOLN
1.0000 m[IU]/min | INTRAVENOUS | Status: DC
  Administered 2023-02-04: 8 m[IU]/min via INTRAVENOUS
  Administered 2023-02-04: 2 m[IU]/min via INTRAVENOUS

## 2023-02-04 MED ORDER — SENNOSIDES-DOCUSATE SODIUM 8.6-50 MG PO TABS
2.0000 | ORAL_TABLET | ORAL | Status: DC
  Administered 2023-02-04 – 2023-02-06 (×3): 2 via ORAL
  Filled 2023-02-04 (×3): qty 2

## 2023-02-04 MED ORDER — OXYCODONE HCL 5 MG PO TABS
5.0000 mg | ORAL_TABLET | Freq: Once | ORAL | Status: AC | PRN
  Administered 2023-02-04: 5 mg via ORAL
  Filled 2023-02-04: qty 1

## 2023-02-04 MED ORDER — FENTANYL CITRATE (PF) 100 MCG/2ML IJ SOLN
100.0000 ug | INTRAMUSCULAR | Status: DC | PRN
  Administered 2023-02-04 (×2): 100 ug via INTRAVENOUS
  Filled 2023-02-04 (×2): qty 2

## 2023-02-04 MED ORDER — ONDANSETRON HCL 4 MG/2ML IJ SOLN: 4.0000 mg | INTRAMUSCULAR | Status: DC | PRN

## 2023-02-04 MED ORDER — AMOXICILLIN-POT CLAVULANATE 875-125 MG PO TABS
1.0000 | ORAL_TABLET | Freq: Two times a day (BID) | ORAL | 0 refills | Status: DC
Start: 2023-02-04 — End: 2023-02-06

## 2023-02-04 MED ORDER — DIBUCAINE (PERIANAL) 1 % EX OINT: 1.0000 | TOPICAL_OINTMENT | CUTANEOUS | Status: DC | PRN

## 2023-02-04 MED ORDER — DIPHENHYDRAMINE HCL 25 MG PO CAPS: 25.0000 mg | ORAL_CAPSULE | Freq: Four times a day (QID) | ORAL | Status: DC | PRN

## 2023-02-04 MED ORDER — WITCH HAZEL-GLYCERIN EX PADS: 1.0000 | MEDICATED_PAD | CUTANEOUS | Status: DC | PRN

## 2023-02-04 MED ORDER — SIMETHICONE 80 MG PO CHEW: 80.0000 mg | CHEWABLE_TABLET | ORAL | Status: DC | PRN

## 2023-02-04 MED ORDER — COCONUT OIL OIL
1.0000 | TOPICAL_OIL | Status: DC | PRN
  Administered 2023-02-04: 1 via TOPICAL

## 2023-02-04 MED ORDER — PRENATAL MULTIVITAMIN CH
1.0000 | ORAL_TABLET | Freq: Every day | ORAL | Status: DC
  Administered 2023-02-04 – 2023-02-06 (×3): 1 via ORAL
  Filled 2023-02-04 (×3): qty 1

## 2023-02-04 MED ORDER — TETANUS-DIPHTH-ACELL PERTUSSIS 5-2.5-18.5 LF-MCG/0.5 IM SUSY
0.5000 mL | PREFILLED_SYRINGE | Freq: Once | INTRAMUSCULAR | Status: AC
  Administered 2023-02-05: 0.5 mL via INTRAMUSCULAR
  Filled 2023-02-04: qty 0.5

## 2023-02-04 MED ORDER — ONDANSETRON HCL 4 MG PO TABS: 4.0000 mg | ORAL_TABLET | ORAL | Status: DC | PRN

## 2023-02-04 MED ORDER — ACETAMINOPHEN 325 MG PO TABS
650.0000 mg | ORAL_TABLET | ORAL | Status: DC | PRN
  Administered 2023-02-04 – 2023-02-05 (×2): 650 mg via ORAL
  Filled 2023-02-04 (×2): qty 2

## 2023-02-04 MED ORDER — IBUPROFEN 600 MG PO TABS
600.0000 mg | ORAL_TABLET | Freq: Four times a day (QID) | ORAL | Status: DC
  Administered 2023-02-04 – 2023-02-06 (×7): 600 mg via ORAL
  Filled 2023-02-04 (×8): qty 1

## 2023-02-04 NOTE — Progress Notes (Signed)
Labor Progress Note Traci Castro is a 27 y.o. [phone] at [redacted]w[redacted]d presented for IOL 2/2 gestational HTN S: Patient is feeling her contractions, wanting to go natural.  O:  BP 123/65   Pulse 92   Temp 98.4 F (36.9 C) (Oral)   Resp 16   Ht 5\' 4"  (1.626 m)   Wt (!) 156.2 kg   LMP 05/13/2022   SpO2 100%   BMI 59.12 kg/m  EFM: baseline 125/moderate variability/+Accels, late decels from 0740-0750 previously that resolved with position changes, none now Toco: q2-3 mintues, breathing through them  CVE: Dilation: 4.5 Effacement (%): 80 Station: -3 Presentation: Vertex Exam by:: H.Price, RN   A&P: 27 y.o. [id number] [redacted]w[redacted]d who presented for IOL for gestational HTN. #Labor: Progressing well. Discussed and offered AROM, pt will consider and let me know if when she would like to proceed. Continue pitocin. #Pain: PRN IV, epidural if desires #FWB: previous intermittent late decels that resolved with position changes, now with moderate variability and accels and overall reassuring, will monitor closely #GBS  pending  #Gestational HTN- BP mild range, asymptomatic, pre-E labs negative for severe features and UPC ratio 0.29. Monitor closely.   #LGA infant- EFW 3014g, 99% @33wks --> extrapolates to 3921g, 8lbs 10oz. She is tested to 7lbs 7oz.  -Avoid vacuum delivery and continue to monitor descent - BG x2 was 83, 103, will continue to monitor q4hr  Billey Co, MD Center for [facility], [facility] 9:15 AM

## 2023-02-04 NOTE — Progress Notes (Signed)
patient rates pain 10/10 at this time, pain med gvien per order, encouraged patient to attempt to get up to void, patient states she would like to wait for visitors and shower. Informed patient abdominal cramping could be caused by full bladder. Bleeding is small, fundus firm at assessment. Pt states she will wait to get up and just take pain meds for now.  Shelah Lewandowsky, RN

## 2023-02-04 NOTE — Discharge Summary (Signed)
Postpartum Discharge Summary  Date of Service updated***     Patient Name: Traci Castro DOB: [birth year] MRN: [id number]  Date of admission: 02/03/2023 Delivery date:02/04/2023  Delivering provider: PRAY, Claris Che E  Date of discharge: 02/04/2023  Admitting diagnosis: Gestational hypertension [O13.9] Intrauterine pregnancy: [redacted]w[redacted]d     Secondary diagnosis:  Principal Problem:   Gestational hypertension Active Problems:   LGA (large for gestational age) fetus affecting management of mother  Additional problems: ***    Discharge diagnosis: Term Pregnancy Delivered and Gestational Hypertension                                              Post partum procedures:{Postpartum procedures:23558} Augmentation: AROM, Pitocin, Cytotec, and IP Foley Complications: None  Hospital course: Induction of Labor With Vaginal Delivery   27 y.o. yo Traci Castro at [redacted]w[redacted]d was admitted to the hospital 02/03/2023 for induction of labor.  Indication for induction: Gestational hypertension.  Patient had an uncomplicated labor course. Membrane Rupture Time/Date: 10:36 AM ,02/04/2023   Delivery Method:Vaginal, Spontaneous  Episiotomy: None  Lacerations:  None  Details of delivery can be found in separate delivery note.  Patient had a postpartum course complicated by***. Start on lasix daily x 5 days for gestational HTN. Patient is discharged home 02/04/23.  Newborn Data: Birth date:02/04/2023  Birth time:10:38 AM  Gender:Female  Living status:Living  Apgars:7 ,9  Weight:4140 g   Magnesium Sulfate received: No BMZ received: No Rhophylac:N/A MMR:N/A T-DaP: declined prenatally Flu: N/A not season Transfusion:{Transfusion received:30440034}  Physical exam  Vitals:   02/04/23 1002 02/04/23 1032 02/04/23 1048 02/04/23 1102  BP: 131/86 (!) 123/110 136/62 (!) 142/79  Pulse: 90 99 (!) 107 99  Resp:      Temp:      TempSrc:      SpO2:      Weight:      Height:       General: alert, cooperative, and no  distress Lochia: appropriate Uterine Fundus: firm Incision: N/A DVT Evaluation: {Exam; dvt:2111122} Labs: Lab Results  Component Value Date   WBC 8.2 02/03/2023   HGB 9.5 (L) 02/03/2023   HCT 32.6 (L) 02/03/2023   MCV 76.3 (L) 02/03/2023   PLT 328 02/03/2023      Latest Ref Rng & Units 02/03/2023    1:50 PM  CMP  Glucose 70 - 99 mg/dL 93   BUN 6 - 20 mg/dL 7   Creatinine 2.95 - 6.21 mg/dL 3.08   Sodium 657 - 846 mmol/L 134   Potassium 3.5 - 5.1 mmol/L 4.2   Chloride 98 - 111 mmol/L 109   CO2 22 - 32 mmol/L 17   Calcium 8.9 - 10.3 mg/dL 8.7   Total Protein 6.5 - 8.1 g/dL 6.0   Total Bilirubin 0.3 - 1.2 mg/dL <9.6   Alkaline Phos 38 - 126 U/L 190   AST 15 - 41 U/L 18   ALT 0 - 44 U/L 11    Edinburgh Score:    08/11/2019    9:15 AM  Edinburgh Postnatal Depression Scale Screening Tool  I have been able to laugh and see the funny side of things. 1  I have looked forward with enjoyment to things. 3  I have blamed myself unnecessarily when things went wrong. 3  I have been anxious or worried for no good reason. 0  I have felt  scared or panicky for no good reason. 2  Things have been getting on top of me. 2  I have been so unhappy that I have had difficulty sleeping. 0  I have felt sad or miserable. 3  I have been so unhappy that I have been crying. 2  The thought of harming myself has occurred to me. 0  Edinburgh Postnatal Depression Scale Total 16     After visit meds:  Allergies as of 02/04/2023       Reactions   Chlorhexidine Itching, Rash   Severe itching, redness.    Tramadol Nausea And Vomiting     Med Rec must be completed prior to using this [facility]***        Discharge home in stable condition Infant Feeding: Breast Infant Disposition:{CHL IP OB HOME WITH Traci Castro:[address]1478} Discharge instruction: per After Visit Summary and Postpartum booklet. Activity: Advance as tolerated. Pelvic rest for 6 weeks.  Diet: routine diet Future Appointments: Future  Appointments  Date Time Provider Department Center  02/08/2023 10:55 AM Lennart Pall, MD [facility] None   Follow up Visit:  Follow-up Information     [facility] for [facility] Follow up.   Specialty: Obstetrics and Gynecology Contact information: [address], [address] [place] [address] [phone]               Message sent to schedule PP appt on 02/04/23.  Please schedule this patient for a In person postpartum visit in 6 weeks with the following provider: any provider Additional Postpartum F/U:BP check 2-3 days  High risk pregnancy complicated by: HTN Delivery mode:  Vaginal, Spontaneous  Anticipated Birth Control:  Nexplanon   02/04/2023 Traci Co, MD

## 2023-02-04 NOTE — Lactation Note (Signed)
This note was copied from a baby's chart. Lactation Consultation Note  Patient Name: Traci Castro [id number] Date: 02/04/2023 Age:27 hours Reason for consult: Initial assessment;Early term 33-38.6wks Mom stated the baby hasn't interested in BF very much so she has been giving him formula. Mom has used DEBP and didn't get anything. Explained that is normal. Mom breast fed her other children from 2 weeks to 1 yr. Each child was different. Newborn feeding habits, STS, I&O, supply and demand reviewed. Mom encouraged to feed baby 8-12 times/24 hours and with feeding cues.  Encouraged mom to call for latch assistance if needed. Maternal Data Does the patient have breastfeeding experience prior to this delivery?: Yes How long did the patient breastfeed?: 2 weeks-1 yr  Feeding Mother's Current Feeding Choice: Breast Milk and Formula  LATCH Score       Type of Nipple: Everted at rest and after stimulation            Lactation Tools Discussed/Used Tools: Pump Breast pump type: Double-Electric Breast Pump Pump Education:  (RN set up) Reason for Pumping: stimulation/supplementation  Interventions Interventions: DEBP;Breast feeding basics reviewed;LC Services brochure  Discharge    Consult Status Consult Status: Follow-up Date: 02/05/23 Follow-up type: In-patient    Charyl Dancer 02/04/2023, 11:03 PM

## 2023-02-05 ENCOUNTER — Other Ambulatory Visit: Payer: Self-pay | Admitting: Obstetrics

## 2023-02-05 DIAGNOSIS — B9689 Other specified bacterial agents as the cause of diseases classified elsewhere: Secondary | ICD-10-CM

## 2023-02-05 DIAGNOSIS — B379 Candidiasis, unspecified: Secondary | ICD-10-CM

## 2023-02-05 MED ORDER — FERROUS SULFATE 325 (65 FE) MG PO TABS
325.0000 mg | ORAL_TABLET | ORAL | Status: DC
  Administered 2023-02-05: 325 mg via ORAL
  Filled 2023-02-05: qty 1

## 2023-02-05 MED ORDER — ETONOGESTREL 68 MG ~~LOC~~ IMPL
68.0000 mg | DRUG_IMPLANT | Freq: Once | SUBCUTANEOUS | Status: AC
  Administered 2023-02-06: 68 mg via SUBCUTANEOUS
  Filled 2023-02-05: qty 1

## 2023-02-05 MED ORDER — METRONIDAZOLE 500 MG PO TABS
500.0000 mg | ORAL_TABLET | Freq: Two times a day (BID) | ORAL | 2 refills | Status: DC
Start: 2023-02-05 — End: 2023-02-06

## 2023-02-05 MED ORDER — ACETAMINOPHEN 325 MG PO TABS
650.0000 mg | ORAL_TABLET | Freq: Four times a day (QID) | ORAL | Status: DC
  Administered 2023-02-05 – 2023-02-06 (×3): 650 mg via ORAL
  Filled 2023-02-05 (×4): qty 2

## 2023-02-05 MED ORDER — TERCONAZOLE 0.8 % VA CREA
1.0000 | TOPICAL_CREAM | Freq: Every day | VAGINAL | 0 refills | Status: DC
Start: 2023-02-05 — End: 2023-03-14

## 2023-02-05 MED ORDER — OXYCODONE HCL 5 MG PO TABS
5.0000 mg | ORAL_TABLET | ORAL | Status: DC | PRN
  Administered 2023-02-05: 5 mg via ORAL
  Filled 2023-02-05: qty 1

## 2023-02-05 MED ORDER — LIDOCAINE HCL 1 % IJ SOLN
0.0000 mL | Freq: Once | INTRAMUSCULAR | Status: AC | PRN
  Administered 2023-02-06: 20 mL via INTRADERMAL
  Filled 2023-02-05: qty 20

## 2023-02-05 NOTE — Progress Notes (Addendum)
POSTPARTUM PROGRESS NOTE  Post Partum Day 1  Subjective:  Traci Castro is a 27 y.o. [id number] s/p VD at [redacted]w[redacted]d.  She reports she is doing well. No acute events overnight. She denies any problems with ambulating, voiding or po intake. Denies nausea or vomiting.  Pain is moderately controlled.  Lochia is adequate.  Objective: Blood pressure 120/69, pulse 95, temperature 97.8 F (36.6 C), temperature source Oral, resp. rate 20, height 5\' 4"  (1.626 m), weight (!) 156.2 kg, last menstrual period 05/13/2022, SpO2 100 %, unknown if currently breastfeeding.  Physical Exam:  General: alert, cooperative and no distress Chest: no respiratory distress Heart:regular rate, distal pulses intact Uterine Fundus: firm, appropriately tender DVT Evaluation: No calf swelling or tenderness Extremities: mild edema Skin: warm, dry  Recent Labs    02/03/23 1350  HGB 9.5*  HCT 32.6*    Assessment/Plan: Traci Castro is a 27 y.o. [id number] s/p VD at [redacted]w[redacted]d   PPD#1 - Doing well  Routine postpartum care Pain meds adjusted Added ferrous sulfate Gestational hypertension: On Lasix daily, BP well-controlled.  Sometimes borderline hypotensive, so we will keep Lasix once daily.  Encourage patient to elevate lower extremities to help with swelling. Contraception: nexplanon Feeding: Breast Dispo: Plan for discharge tomorrow.   LOS: 2 days   Myrtie Hawk, DO OB Fellow  02/05/2023, 2:46 PM

## 2023-02-05 NOTE — Clinical Social Work Maternal (Signed)
CLINICAL SOCIAL WORK MATERNAL/CHILD NOTE  Patient Details  Name: Traci Castro MRN: [id number] Date of Birth: 11-27-1995  Date:  02/05/2023  Clinical Social Worker Initiating Note:  Willaim Rayas Chasady Longwell Date/Time: Initiated:  02/05/23/1504     Child's Name:  Traci Castro 02/04/2023   Biological Parents:  Mother Traci Castro [birth year])   Need for Interpreter:  None   Reason for Referral:  Behavioral Health Concerns, Adoption   Address:  [address] [place] [address]    Phone number:  [phone] (home)     Additional phone number:   Household Members/Support Persons (HM/SP):   Household Member/Support Person 1, Household Member/Support Person 2, Household Member/Support Person 3, Household Member/Support Person 4   HM/SP Name Relationship DOB or Age  HM/SP -1 Lane Hacker friend 64  HM/SP -2 Mizrahim Jean Rosenthal son 05/05/2016  HM/SP -3 Sincere Manson Passey III son 07/23/2018  HM/SP -4 Jh'mir Brown son 07/15/2019  HM/SP -5        HM/SP -6        HM/SP -7        HM/SP -8          Natural Supports (not living in the home):      Professional Supports: None   Employment: Environmental education officer   Type of Work: Home Health   Education:  Some Materials engineer arranged:    Surveyor, quantity Resources:  [employer]   Other Resources:  Sales executive  , WIC   Cultural/Religious Considerations Which May Impact Care:    Strengths:  Ability to meet basic needs  , Home prepared for child  , Pediatrician chosen   Psychotropic Medications:         Pediatrician:    Armed forces operational officer area  Pediatrician List:   [facility]  [employer]    [facility]      Pediatrician Fax Number:    Risk Factors/Current Problems:  None   Cognitive State:  Able to Concentrate  , Alert     Mood/Affect:  Calm  , Comfortable     CSW Assessment: CSW received consult for hx of Bipolar, anxiety, depression and 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  CSW met with MOB to complete assessment and offer support. CSW entered the room and observed MOB alone in the room, resting in bed holding the infant. CSW introduced self, CSW role and reason for visit. MOB was agreeable to visit. CSW inquired about how MOB was feeling, MOB reported alright. CSW inquired about MOB MH hx, MOB reported she was diagnosed with anxiety, depression and bipolar and at age 89. CSW inquired about any treatment, MOB reported she does not take any medication nor is she in therapy at this time. CSW inquired about MOB last bipolar episode, MOB reported when she was diagnosed. MOB reported a stable mood throughout her pregnancy. CSW inquired about and PPD, MOB reported she experienced it after all of her older children CSW inquired  about MOB's symptoms and the time period, MOB reported crying a lot and it lasted for about a month each time. CSW assessed for safety, MOB denied any SI, HI or DV. CSW provided education regarding the baby blues period vs. perinatal mood disorders, discussed treatment and gave resources for mental health follow up if concerns arise.  CSW recommends self-evaluation during the postpartum time period using the New Mom Checklist from Postpartum Progress and encouraged MOB to contact a medical professional if  symptoms are noted at any time. MOB identified her friend as her support.   CSW inquired about infant adoption, MOB reported she has decided to move in with her best friend and not go through with the adoption process. CSW verbalized understanding.   CSW provided review of Sudden Infant Death Syndrome (SIDS) precautions.  MOB identified [facility] for infant follow up care. MOB reported she has a bassinet and crib for the infant but could use dome help getting diaper wipes and a car seat. CSW explained  a referral could be made to family connect for diapers and wipes, MOB was agreeable to referral. CSW explained to MOB the hospital has car seat for $30 MOB  reported she could provide the money for a car seat. CSW notified MOB a car seat would be provided to her prior to discharge. MOB was appreciative.  CSW identifies no further need for intervention and no barriers to discharge at this time.  CSW Plan/Description:  No Further Intervention Required/No Barriers to Discharge, Sudden Infant Death Syndrome (SIDS) Education, Perinatal Mood and Anxiety Disorder (PMADs) Education, Psychosocial Support and Ongoing Assessment of Needs, Other Information/Referral to [employer], LCSW 02/05/2023, 3:08 PM

## 2023-02-06 ENCOUNTER — Other Ambulatory Visit (HOSPITAL_COMMUNITY): Payer: Self-pay

## 2023-02-06 ENCOUNTER — Ambulatory Visit: Payer: Medicaid Other

## 2023-02-06 DIAGNOSIS — Z30017 Encounter for initial prescription of implantable subdermal contraceptive: Secondary | ICD-10-CM

## 2023-02-06 MED ORDER — IBUPROFEN 600 MG PO TABS
600.0000 mg | ORAL_TABLET | Freq: Four times a day (QID) | ORAL | 0 refills | Status: DC
  Filled 2023-02-06: qty 30, 8d supply, fill #0

## 2023-02-06 MED ORDER — FUROSEMIDE 20 MG PO TABS
20.0000 mg | ORAL_TABLET | Freq: Every day | ORAL | 0 refills | Status: DC
  Filled 2023-02-06: qty 3, 3d supply, fill #0

## 2023-02-06 NOTE — Procedures (Signed)
PRE-OP DIAGNOSIS: desired long-term, reversible contraception   POST-OP DIAGNOSIS: Same   PROCEDURE: Nexplanon  placement  Performing Physician: Shonna Chock, MD   PROCEDURE:  Written informed consent obtained. Discussed risk of bleeding, infection, and damage to surrounding tissue. Also discussed side effects including unscheduled bleeding.   Site (check): left arm        Sterile Preparation:    [_]      Betadine        [_]     Chloraprep             After a time-out, insertion site was selected 6 - 10 cm from medial epicondyle and marked. Procedure area was prepped and draped in a sterile fashion. 3 mL of 1% lidocaine without epinephrine was used for subcutaneous anesthesia. Anesthesia confirmed.  Nexplanon  trocar was inserted subcutaneously and then Nexplanon  capsule delivered subcutaneously. Trocar was removed from the insertion site. Nexplanon  capsule was palpated by provider and patient to assure satisfactory placement.  Estimated blood loss: minimal Dressings applied: steri-strip, coband Followup: The patient tolerated the procedure well without complications.  Standard post-procedure care wass explained and return precautions were given.

## 2023-02-06 NOTE — Discharge Instructions (Signed)
WHAT TO LOOK OUT FOR: Fever of 100.4 or above Mastitis: feels like flu and breasts hurt Infection: increased pain, swelling or redness Blood clots golf ball size or larger Postpartum depression   Congratulations on your newest addition! 

## 2023-02-08 ENCOUNTER — Encounter: Payer: Medicaid Other | Admitting: Obstetrics and Gynecology

## 2023-02-11 ENCOUNTER — Ambulatory Visit (INDEPENDENT_AMBULATORY_CARE_PROVIDER_SITE_OTHER): Payer: Medicaid Other | Admitting: *Deleted

## 2023-02-11 VITALS — BP 133/83 | Wt 313.0 lb

## 2023-02-11 DIAGNOSIS — Z0130 Encounter for examination of blood pressure without abnormal findings: Secondary | ICD-10-CM

## 2023-02-11 DIAGNOSIS — O133 Gestational [pregnancy-induced] hypertension without significant proteinuria, third trimester: Secondary | ICD-10-CM

## 2023-02-11 DIAGNOSIS — M7989 Other specified soft tissue disorders: Secondary | ICD-10-CM

## 2023-02-11 MED ORDER — HYDROCHLOROTHIAZIDE 25 MG PO TABS
25.0000 mg | ORAL_TABLET | Freq: Every day | ORAL | 3 refills | Status: DC
Start: 2023-02-11 — End: 2023-03-14

## 2023-02-11 NOTE — Patient Instructions (Signed)
Pick up your prescription for hydrochlorothiazide. Return for a blood pressure check in one week. Continue to take your prenatal vitamin. You can pick up your compression hose at [facility]

## 2023-02-11 NOTE — Progress Notes (Signed)
Subjective:  Traci Castro is a 27 y.o. female here for BP check.   Hypertension ROS: no TIA's, no chest pain on exertion, no dyspnea on exertion, and no swelling of ankles.    Objective:  LMP 05/13/2022   Appearance alert, well appearing, and in no distress, oriented to person, place, and time, and overweight. General exam BP noted to be well controlled today in office.    Assessment:   Blood Pressure stable.   Plan:  Orders and follow up as documented in patient record.. RX hydrochlorothiazide Repeat BP check 1 week.

## 2023-02-17 NOTE — Progress Notes (Signed)
**Note Traci-Identified via Obfuscation** I connected with Traci Castro 02/17/23 at 10:55 AM EDT by: MyChart video and verified that I am speaking with the correct person using two identifiers.  Patient is located at home and provider is located at [facility].     I discussed the limitations, risks, security and privacy concerns of performing an evaluation and management service by MyChart video and the availability of in person appointments. I also discussed with the patient that there may be a patient responsible charge related to this service. By engaging in this virtual visit, you consent to the provision of healthcare.  Additionally, you authorize for your insurance to be billed for the services provided during this visit.  The patient expressed understanding and agreed to proceed.  The following staff members participated in the virtual visit: Hope Pigeon    PRENATAL VISIT NOTE  Subjective:  Traci Castro is a 27 y.o. [id number] at [redacted]w[redacted]d  for virtual visit for ongoing prenatal care.  She is currently monitored for the following issues for this low-risk pregnancy and has BMI 45.0-49.9, adult (HCC); [name], antepartum; Maternal obesity affecting pregnancy, antepartum; LGA (large for gestational age) fetus affecting management of mother; and Gestational hypertension on their problem list.  Patient reports  constipation . Would like medication for this. Dulcolax did not work very wel .  Contractions: Not present. Vag. Bleeding: None.  Movement: Present. Denies leaking of fluid.   The following portions of the patient's history were reviewed and updated as appropriate: allergies, current medications, past family history, past medical history, past social history, past surgical history and problem list.   Objective:   Vitals:   01/25/23 1106  BP: 136/64  Pulse: 90   Self-Obtained  Fetal Status:     Movement: Present     Assessment and Plan:  Pregnancy: [id number] at [redacted]w[redacted]d 1. Supervision of other  normal pregnancy, antepartum   2. Constipation during pregnancy in second trimester - polyethylene glycol powder (GLYCOLAX/MIRALAX) 17 GM/SCOOP powder; Take 17 g by mouth daily.  Dispense: 850 g; Refill: 0   3. Excessive fetal growth affecting management of pregnancy in third trimester, single or unspecified fetus   Pt desires WB and LLK, CNM to manage. Discussed monitoring BP and r/o of WB if BP is elevated. Pt understands.    Preterm labor symptoms and general obstetric precautions including but not limited to vaginal bleeding, contractions, leaking of fluid and fetal movement were reviewed in detail with the patient. Please refer to After Visit Summary for other counseling recommendations.   Return in about 1 week (around 02/01/2023) for ROB.  Future Appointments  Date Time Provider Department Center  02/19/2023  9:20 AM [facility] NURSE [facility] None  03/14/2023  9:15 AM Hermina Staggers, MD [facility] None    Time spent on virtual visit: 25 minutes  Traci Hawk, DO

## 2023-02-19 ENCOUNTER — Ambulatory Visit (INDEPENDENT_AMBULATORY_CARE_PROVIDER_SITE_OTHER): Payer: Medicaid Other

## 2023-02-19 VITALS — BP 119/83 | HR 66 | Wt 298.5 lb

## 2023-02-19 DIAGNOSIS — Z0130 Encounter for examination of blood pressure without abnormal findings: Secondary | ICD-10-CM

## 2023-02-19 NOTE — Progress Notes (Signed)
..  Subjective:  Traci Castro is a 27 y.o. female here for BP check.   Hypertension ROS: taking medications as instructed, no medication side effects noted, no TIA's, no chest pain on exertion, no dyspnea on exertion, and no swelling of ankles.    Objective:  BP 119/83   Pulse 66   Wt 298 lb 8 oz (135.4 kg)   LMP 05/13/2022   Breastfeeding Yes   BMI 51.24 kg/m   Appearance alert, well appearing, and in no distress. General exam BP noted to be well controlled today in office.    Assessment:   Blood Pressure well controlled.   Plan:  Current treatment plan is effective, no change in therapy. Advised to monitor BP at home and report any changes and/or abnormal symptoms. PP is scheduled for 03/14/2023.

## 2023-02-23 ENCOUNTER — Inpatient Hospital Stay (HOSPITAL_COMMUNITY): Admission: AD | Admit: 2023-02-23 | Payer: Medicaid Other | Source: Home / Self Care

## 2023-03-14 ENCOUNTER — Ambulatory Visit: Payer: Medicaid Other | Admitting: Obstetrics and Gynecology

## 2023-03-14 ENCOUNTER — Encounter: Payer: Self-pay | Admitting: Obstetrics and Gynecology

## 2023-03-14 DIAGNOSIS — Z975 Presence of (intrauterine) contraceptive device: Secondary | ICD-10-CM | POA: Diagnosis not present

## 2023-03-14 NOTE — Progress Notes (Signed)
..     Post Partum Visit Note  Traci Castro is a 27 y.o. [phone] female who presents for a postpartum visit. She is 5 weeks postpartum following a normal spontaneous vaginal delivery.  I have fully reviewed the prenatal and intrapartum course. The delivery was at 37.2 gestational weeks.  Anesthesia: none. Postpartum course has been good. Baby is doing well. Baby is feeding by both breast and bottle - Total Care Sensitive 360 . Bleeding staining only. Bowel function is  normal while taking Colace . Bladder function is normal. Patient is not sexually active. Contraception method is Nexplanon. Postpartum depression screening: negative.   The pregnancy intention screening data noted above was reviewed. Potential methods of contraception were discussed. The patient elected to proceed with No data recorded.    Health Maintenance Due  Topic Date Due   COVID-19 Vaccine (1 - 2023-24 season) Never done    Medical records  Review of Systems Pertinent items are noted in HPI.  Objective:  Ht 5\' 4"  (1.626 m)   Wt (!) 314 lb 9.6 oz (142.7 kg)   LMP 05/13/2022   BMI 54.00 kg/m    General:  alert   Breasts:  not indicated  Lungs: clear to auscultation bilaterally  Heart:  regular rate and rhythm, S1, S2 normal, no murmur, click, rub or gallop  Abdomen: soft, non-tender; bowel sounds normal; no masses,  no organomegaly   Wound NA  GU exam:  not indicated       Assessment:    There are no diagnoses linked to this encounter.  NL postpartum exam.   Plan:   Essential components of care per ACOG recommendations:  1.  Mood and well being: Patient with negative depression screening today. Reviewed local resources for support.  - Patient tobacco use? No.   - hx of drug use? No.    2. Infant care and feeding:  -Patient currently breastmilk feeding? Yes. Reviewed importance of draining breast regularly to support lactation.  -Social determinants of health (SDOH) reviewed in EPIC.   3.  Sexuality, contraception and birth spacing - Patient does not want a pregnancy in the next year.  Desired family size is uncertain.   - Reviewed reproductive life planning. Reviewed contraceptive methods based on pt preferences and effectiveness. Pt had Implanon placed 02/05/23  - Discussed birth spacing of 18 months  4. Sleep and fatigue -Encouraged family/partner/community support of 4 hrs of uninterrupted sleep to help with mood and fatigue  5. Physical Recovery  - Discussed patients delivery and complications. She describes her labor as good. - Patient had a Vaginal, no problems at delivery. Patient had a 1st degree laceration. Perineal healing reviewed. Patient expressed understanding - Patient has urinary incontinence? No. - Patient is safe to resume physical and sexual activity  6.  Health Maintenance - HM due items addressed Yes - Last pap smear  Diagnosis  Date Value Ref Range Status  04/25/2022   Final   - Negative for intraepithelial lesion or malignancy (NILM)   Pap smear not done at today's visit.  -Breast Cancer screening indicated? No.   7. Chronic Disease/Pregnancy Condition follow up: None    Nettie Elm, MD Center for [facility], [facility]

## 2023-04-09 ENCOUNTER — Emergency Department (HOSPITAL_BASED_OUTPATIENT_CLINIC_OR_DEPARTMENT_OTHER)
Admission: EM | Admit: 2023-04-09 | Discharge: 2023-04-09 | Disposition: A | Discharge status: Home / Self Care | Payer: Medicaid Other

## 2023-04-09 ENCOUNTER — Other Ambulatory Visit: Payer: Self-pay

## 2023-04-09 DIAGNOSIS — M542 Cervicalgia: Secondary | ICD-10-CM | POA: Diagnosis not present

## 2023-04-09 DIAGNOSIS — S299XXA Unspecified injury of thorax, initial encounter: Secondary | ICD-10-CM | POA: Diagnosis present

## 2023-04-09 DIAGNOSIS — S29012A Strain of muscle and tendon of back wall of thorax, initial encounter: Secondary | ICD-10-CM | POA: Diagnosis not present

## 2023-04-09 DIAGNOSIS — H538 Other visual disturbances: Secondary | ICD-10-CM | POA: Diagnosis not present

## 2023-04-09 DIAGNOSIS — Y92410 Unspecified street and highway as the place of occurrence of the external cause: Secondary | ICD-10-CM | POA: Diagnosis not present

## 2023-04-09 DIAGNOSIS — S39012A Strain of muscle, fascia and tendon of lower back, initial encounter: Secondary | ICD-10-CM

## 2023-04-09 DIAGNOSIS — R519 Headache, unspecified: Secondary | ICD-10-CM | POA: Diagnosis not present

## 2023-04-09 DIAGNOSIS — R531 Weakness: Secondary | ICD-10-CM | POA: Insufficient documentation

## 2023-04-09 DIAGNOSIS — R238 Other skin changes: Secondary | ICD-10-CM | POA: Diagnosis not present

## 2023-04-09 MED ORDER — LIDOCAINE 5 % EX PTCH: 1.0000 | MEDICATED_PATCH | CUTANEOUS | 0 refills | Status: AC

## 2023-04-09 MED ORDER — LIDOCAINE 5 % EX PTCH
1.0000 | MEDICATED_PATCH | CUTANEOUS | Status: DC
  Administered 2023-04-09: 1 via TRANSDERMAL
  Filled 2023-04-09: qty 1
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
## 2023-04-09 NOTE — Discharge Instructions (Addendum)
Please follow-up with your primary care provider regarding recent symptoms and ER visit.  Today your exam was also reassuring and you have a muscle spasm/strain that can be treated with Tylenol at home.  Please use your Flexeril at home as well and use heat heat packs.  If symptoms change or worsen please return to ER.

## 2023-04-09 NOTE — ED Notes (Signed)
Patient given discharge instructions. Questions were answered. Patient verbalized understanding of discharge instructions and care at home.  
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
## 2023-05-15 ENCOUNTER — Other Ambulatory Visit: Payer: Medicaid Other

## 2023-05-15 DIAGNOSIS — O24439 Gestational diabetes mellitus in the puerperium, unspecified control: Secondary | ICD-10-CM

## 2023-05-16 ENCOUNTER — Telehealth (INDEPENDENT_AMBULATORY_CARE_PROVIDER_SITE_OTHER): Payer: Medicaid Other | Admitting: Obstetrics and Gynecology

## 2023-05-16 ENCOUNTER — Encounter: Payer: Self-pay | Admitting: Obstetrics and Gynecology

## 2023-05-16 DIAGNOSIS — Z975 Presence of (intrauterine) contraceptive device: Secondary | ICD-10-CM

## 2023-05-16 DIAGNOSIS — N939 Abnormal uterine and vaginal bleeding, unspecified: Secondary | ICD-10-CM | POA: Diagnosis not present

## 2023-05-16 DIAGNOSIS — Z3046 Encounter for surveillance of implantable subdermal contraceptive: Secondary | ICD-10-CM

## 2023-05-16 LAB — GLUCOSE TOLERANCE, 2 HOURS
Glucose, 2 hour: 97 mg/dL (ref 70–139)
Glucose, GTT - Fasting: 86 mg/dL (ref 70–99)

## 2023-05-16 MED ORDER — TRANEXAMIC ACID 650 MG PO TABS: 1300.0000 mg | ORAL_TABLET | Freq: Three times a day (TID) | ORAL | 2 refills | Status: AC

## 2023-05-16 NOTE — Progress Notes (Signed)
TELEHEALTH GYNECOLOGY VISIT ENCOUNTER NOTE  Provider location: Center for [facility] at [facility]   Patient location: Home  I connected with Traci Castro on 05/16/23 at  4:10 PM EDT by telephone and verified that I am speaking with the correct person using two identifiers. Patient was unable to do MyChart audiovisual encounter due to technical difficulties, she tried several times.    I discussed the limitations, risks, security and privacy concerns of performing an evaluation and management service by telephone and the availability of in person appointments. I also discussed with the patient that there may be a patient responsible charge related to this service. The patient expressed understanding and agreed to proceed.   History:  Traci Castro is a 27 y.o. [phone] female being evaluated today for breakthrough bleeding on Nexplanon. She had nexplanon placed postpartum in June and has had regular bleeding since. She had this issue with prior nexplanon placement and is requesting medication to help stop bleeding (which she has taken for same before). She is otherwise happy with nexplanon and does not want to discontinue, just wants bridge rx to help stop bleeding. Otherwise, no complaints.       Past Medical History:  Diagnosis Date   Anemia    Anxiety    no meds   Bipolar disorder (HCC)    Blood transfusion without reported diagnosis    for pp anemia not PPH   Depression    no meds   Gastritis    2 bouts   H/O seasonal allergies    Migraine    migraines   Migraine headache 04/25/2022   Pain, dental 09/19/2022   Psoriasis    Vision abnormalities    wears glasses   [name] 01/31/2018   Past Surgical History:  Procedure Laterality Date   DILATION AND CURETTAGE OF UTERUS     DILATION AND EVACUATION N/A 09/20/2017   Procedure: SUCTION DILATATION AND EVACUATION;  Surgeon: Ben Lomond Bing, MD;  Location: [facility];  Service: Gynecology;  Laterality: N/A;    ESOPHAGOGASTRODUODENOSCOPY N/A 10/17/2012   Procedure: ESOPHAGOGASTRODUODENOSCOPY (EGD);  Surgeon: Jon Gills, MD;  Location: [facility] OR;  Service: Gastroenterology;  Laterality: N/A;   THERAPEUTIC ABORTION  09/16/2017   Clinic   WISDOM TOOTH EXTRACTION     The following portions of the patient's history were reviewed and updated as appropriate: allergies, current medications, past family history, past medical history, past social history, past surgical history and problem list.   Health Maintenance:      Component Value Date/Time   DIAGPAP  04/25/2022 1609    - Negative for intraepithelial lesion or malignancy (NILM)   DIAGPAP  01/28/2018 0000    NEGATIVE FOR INTRAEPITHELIAL LESIONS OR MALIGNANCY.   DIAGPAP  01/28/2018 0000    FUNGAL ORGANISMS PRESENT CONSISTENT WITH CANDIDA SPP.   ADEQPAP  04/25/2022 1609    Satisfactory for evaluation; transformation zone component PRESENT.   ADEQPAP  01/28/2018 0000    Satisfactory for evaluation  endocervical/transformation zone component PRESENT.    High Risk HPV: Positive  Adequacy:  Satisfactory for evaluation, transformation zone component PRESENT  Diagnosis:  Atypical squamous cells of undetermined significance (ASC-US)  Review of Systems:  Pertinent items noted in HPI and remainder of comprehensive ROS otherwise negative.  Physical Exam:   General:  Alert, oriented and cooperative.   Mental Status: Normal mood and affect perceived. Normal judgment and thought content.  Physical exam deferred due to nature of the encounter  Labs and Imaging Results  for orders placed or performed in visit on 05/15/23 (from the past 336 hour(s))  Glucose tolerance, 2 hours   Collection Time: 05/15/23  9:43 AM  Result Value Ref Range   Glucose, GTT - Fasting 86 70 - 99 mg/dL   Glucose, 2 hour 97 70 - 139 mg/dL   No results found.    Assessment and Plan:     1. Abnormal uterine bleeding (AUB) Per review of EMR, patient had good resolution of  breakthrough bleeding with TXA after last nexplanon placement, recommend repeating this since she had good response, she is agreeable to this plan Rx sent to pharmacy  2. Nexplanon in place Happy with nexplanon continue    F/u 2 weeks if no improvement   I discussed the assessment and treatment plan with the patient. The patient was provided an opportunity to ask questions and all were answered. The patient agreed with the plan and demonstrated an understanding of the instructions.   The patient was advised to call back or seek an in-person evaluation/go to the ED if the symptoms worsen or if the condition fails to improve as anticipated.  I provided 10 minutes of non-face-to-face time during this encounter.   Conan Bowens, MD Center for [facility], [facility]

## 2023-12-02 ENCOUNTER — Telehealth: Admitting: Family Medicine

## 2023-12-02 ENCOUNTER — Encounter: Payer: Self-pay | Admitting: Family Medicine

## 2023-12-02 DIAGNOSIS — R112 Nausea with vomiting, unspecified: Secondary | ICD-10-CM

## 2023-12-02 MED ORDER — ONDANSETRON 4 MG PO TBDP
4.0000 mg | ORAL_TABLET | Freq: Three times a day (TID) | ORAL | 0 refills | Status: AC | PRN
Start: 2023-12-02 — End: ?

## 2023-12-02 NOTE — Progress Notes (Signed)
 E-Visit for Nausea and Vomiting   We are sorry that you are not feeling well. Here is how we plan to help!  Based on what you have shared with me it looks like you have a Virus that is irritating your GI tract.  Vomiting is the forceful emptying of a portion of the stomach's content through the mouth.  Although nausea and vomiting can make you feel miserable, it's important to remember that these are not diseases, but rather symptoms of an underlying illness.  When we treat short term symptoms, we always caution that any symptoms that persist should be fully evaluated in a medical office.  I have prescribed a medication that will help alleviate your symptoms and allow you to stay hydrated:  Zofran 4 mg 1 tablet every 8 hours as needed for nausea and vomiting  HOME CARE: Drink clear liquids.  This is very important! Dehydration (the lack of fluid) can lead to a serious complication.  Start off with 1 tablespoon every 5 minutes for 8 hours. You may begin eating bland foods after 8 hours without vomiting.  Start with saltine crackers, white bread, rice, mashed potatoes, applesauce. After 48 hours on a bland diet, you may resume a normal diet. Try to go to sleep.  Sleep often empties the stomach and relieves the need to vomit.  GET HELP RIGHT AWAY IF:  Your symptoms do not improve or worsen within 2 days after treatment. You have a fever for over 3 days. You cannot keep down fluids after trying the medication.  MAKE SURE YOU:  Understand these instructions. Will watch your condition. Will get help right away if you are not doing well or get worse.    Thank you for choosing an e-visit.  Your e-visit answers were reviewed by a board certified advanced clinical practitioner to complete your personal care plan. Depending upon the condition, your plan could have included both over the counter or prescription medications.  Please review your pharmacy choice. Make sure the pharmacy is open so  you can pick up prescription now. If there is a problem, you may contact your provider through [employer] and have the prescription routed to another pharmacy.  Your safety is important to [place]. If you have drug allergies check your prescription carefully.   For the next 24 hours you can use MyChart to ask questions about today's visit, request a non-urgent call back, or ask for a work or school excuse. You will get an email in the next two days asking about your experience. I hope that your e-visit has been valuable and will speed your recovery.  I provided 5 minutes of non face-to-face time during this encounter for chart review, medication and order placement, as well as and documentation.

## 2024-01-27 ENCOUNTER — Ambulatory Visit
Admission: EM | Admit: 2024-01-27 | Discharge: 2024-01-27 | Disposition: A | Discharge status: Home / Self Care | Attending: Family Medicine | Admitting: Family Medicine

## 2024-01-27 DIAGNOSIS — H109 Unspecified conjunctivitis: Secondary | ICD-10-CM

## 2024-01-27 DIAGNOSIS — B9689 Other specified bacterial agents as the cause of diseases classified elsewhere: Secondary | ICD-10-CM

## 2024-01-27 MED ORDER — ERYTHROMYCIN 5 MG/GM OP OINT: TOPICAL_OINTMENT | Freq: Four times a day (QID) | OPHTHALMIC | 0 refills | Status: AC

## 2024-01-27 NOTE — ED Provider Notes (Signed)
 [name] - URGENT CARE CENTER  Note:  This document was prepared using Conservation officer, historic buildings and may include unintentional dictation errors.  MRN: [id number] DOB: [birth year]  Subjective:   Traci Castro is a 28 y.o. female presenting for 1 day history of persistent left eye redness and irritation, now having similar irritation to the right eye.  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  No fever, sinus symptoms, vision change, photophobia, eye trauma.  No current facility-administered medications for this encounter.  Current Outpatient Medications:    acetaminophen  (TYLENOL ) 500 MG tablet, Take 1,000 mg by mouth every 6 (six) hours as needed for moderate pain., Disp: , Rfl:    cyclobenzaprine  (FLEXERIL ) 10 MG tablet, Take 1 tablet (10 mg total) by mouth 3 (three) times daily as needed for muscle spasms., Disp: 30 tablet, Rfl: 2   diphenhydrAMINE  (BENADRYL ) 25 MG tablet, Take 1 tablet (25 mg total) by mouth every 6 (six) hours as needed., Disp: 30 tablet, Rfl: 0   docusate sodium  (COLACE) 100 MG capsule, Take 1 capsule (100 mg total) by mouth 2 (two) times daily., Disp: 10 capsule, Rfl: 0   lidocaine  (LIDODERM ) 5 %, Place 1 patch onto the skin daily. Remove & Discard patch within 12 hours or as directed by MD, Disp: 30 patch, Rfl: 0   ondansetron  (ZOFRAN -ODT) 4 MG disintegrating tablet, Take 1 tablet (4 mg total) by mouth every 8 (eight) hours as needed for nausea or vomiting., Disp: 20 tablet, Rfl: 0   Prenatal Vit-Fe Fumarate-FA (PRENATAL VITAMIN PO), Take 1 tablet by mouth daily., Disp: , Rfl:    tranexamic acid  (LYSTEDA ) 650 MG TABS tablet, Take 2 tablets (1,300 mg total) by mouth 3 (three) times daily. Take during menses for a maximum of five days, Disp: 30 tablet, Rfl: 2   Allergies  Allergen Reactions   Chlorhexidine  Itching and Rash    Severe itching, redness.    Tramadol  Nausea And Vomiting    Past Medical History:   Diagnosis Date   Anemia    Anxiety    no meds   Bipolar disorder (HCC)    Blood transfusion without reported diagnosis    for pp anemia not PPH   Depression    no meds   Gastritis    2 bouts   H/O seasonal allergies    Migraine    migraines   Migraine headache 04/25/2022   Pain, dental 09/19/2022   Psoriasis    Vision abnormalities    wears glasses   Vitamin D  deficiency 01/31/2018     Past Surgical History:  Procedure Laterality Date   DILATION AND CURETTAGE OF UTERUS     DILATION AND EVACUATION N/A 09/20/2017   Procedure: SUCTION DILATATION AND EVACUATION;  Surgeon: Raynell Caller, MD;  Location: [facility];  Service: Gynecology;  Laterality: N/A;   ESOPHAGOGASTRODUODENOSCOPY N/A 10/17/2012   Procedure: ESOPHAGOGASTRODUODENOSCOPY (EGD);  Surgeon: Fortunato Ill, MD;  Location: [facility] OR;  Service: Gastroenterology;  Laterality: N/A;   THERAPEUTIC ABORTION  09/16/2017   Clinic   WISDOM TOOTH EXTRACTION      Family History  Problem Relation Age of Onset   Cholelithiasis Mother    Hyperlipidemia Mother    Hypertension Mother    Mental illness Mother    Thyroid  disease Mother    Cholelithiasis Maternal Uncle    Hyperlipidemia Maternal Uncle    Hypertension Maternal Uncle    Cholelithiasis Maternal Grandmother    Arthritis Maternal Grandmother  COPD Maternal Grandmother    Diabetes Maternal Grandmother    Heart disease Maternal Grandmother    Hyperlipidemia Maternal Grandmother    Hypertension Maternal Grandmother    Stroke Maternal Grandmother    Learning disabilities Paternal Grandmother    Mental illness Paternal Grandmother    Cancer Paternal Grandmother    Hyperlipidemia Paternal Grandfather    Diabetes Paternal Grandfather    Depression Father    Hypertension Father    Ulcers Neg Hx    Alcohol abuse Neg Hx    Asthma Neg Hx    Birth defects Neg Hx    Drug abuse Neg Hx    Early death Neg Hx    Hearing loss Neg Hx    Kidney disease Neg Hx    Mental  retardation Neg Hx    Miscarriages / Stillbirths Neg Hx    Vision loss Neg Hx     Social History   Tobacco Use   Smoking status: Never   Smokeless tobacco: Never  Vaping Use   Vaping status: Never Used  Substance Use Topics   Alcohol use: No   Drug use: No    ROS   Objective:   Vitals: There were no vitals taken for this visit.  Physical Exam Constitutional:      General: She is not in acute distress.    Appearance: Normal appearance. She is well-developed. She is not ill-appearing, toxic-appearing or diaphoretic.  HENT:     Head: Normocephalic and atraumatic.     Nose: Nose normal.     Mouth/Throat:     Mouth: Mucous membranes are moist.  Eyes:     General: Lids are normal. Lids are everted, no foreign bodies appreciated. Vision grossly intact. No scleral icterus.       Right eye: No foreign body, discharge or hordeolum.        Left eye: No foreign body, discharge or hordeolum.     Extraocular Movements: Extraocular movements intact.     Right eye: Normal extraocular motion.     Left eye: Normal extraocular motion and no nystagmus.     Conjunctiva/sclera:     Right eye: Right conjunctiva is injected. No chemosis, exudate or hemorrhage.    Left eye: Left conjunctiva is injected (with slight green drainage bilaterally). No chemosis, exudate or hemorrhage. Cardiovascular:     Rate and Rhythm: Normal rate.  Pulmonary:     Effort: Pulmonary effort is normal.  Skin:    General: Skin is warm and dry.  Neurological:     General: No focal deficit present.     Mental Status: She is alert and oriented to person, place, and time.  Psychiatric:        Mood and Affect: Mood normal.        Behavior: Behavior normal.     Assessment and Plan :   PDMP not reviewed this encounter.  1. Bacterial conjunctivitis of both eyes    Will start erythromycin  to address bacterial conjunctivitis of both eyes. Counseled patient on potential for adverse effects with medications  prescribed/recommended today, ER and return-to-clinic precautions discussed, patient verbalized understanding.     Adolph Hoop, [place] 01/27/24 [phone]

## 2024-01-27 NOTE — ED Triage Notes (Signed)
 Pt c/o pink eye to left eye started yesterday-used family member's ointment with some improvement-NAD-steady gait

## 2024-03-13 ENCOUNTER — Encounter: Payer: Self-pay | Admitting: Advanced Practice Midwife

## 2024-06-30 ENCOUNTER — Ambulatory Visit
Admission: EM | Admit: 2024-06-30 | Discharge: 2024-06-30 | Disposition: A | Discharge status: Home / Self Care | Source: Ambulatory Visit | Attending: Family Medicine | Admitting: Family Medicine

## 2024-06-30 DIAGNOSIS — J309 Allergic rhinitis, unspecified: Secondary | ICD-10-CM | POA: Diagnosis not present

## 2024-06-30 DIAGNOSIS — J988 Other specified respiratory disorders: Secondary | ICD-10-CM

## 2024-06-30 DIAGNOSIS — B9789 Other viral agents as the cause of diseases classified elsewhere: Secondary | ICD-10-CM | POA: Diagnosis not present

## 2024-06-30 MED ORDER — PREDNISONE 10 MG PO TABS: 30.0000 mg | ORAL_TABLET | Freq: Every day | ORAL | 0 refills | Status: AC

## 2024-06-30 MED ORDER — PROMETHAZINE-DM 6.25-15 MG/5ML PO SYRP: 5.0000 mL | ORAL_SOLUTION | Freq: Three times a day (TID) | ORAL | 0 refills | Status: DC | PRN

## 2024-06-30 NOTE — ED Provider Notes (Signed)
 [name] - [facility]RG[facility]ARE [facility]  Note:  This document was prepared using Conservation officer, historic buildings and may include unintentional dictation errors.  MRN: [id number] DOB: 04-07-1996  Subjective:   Traci Castro is a 28 y.o. female presenting for 6-day history of malaise, fatigue, coughing, fever, sinus congestion, throat pain, body pains. Cough can elicit chest pain. No asthma. No smoking of any kind including cigarettes, cigars, vaping, marijuana use.  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  Patient would like aggressive management to resolve her symptoms.  No current facility-administered medications for this encounter.  Current Outpatient Medications:    acetaminophen  (TYLENOL ) 500 MG tablet, Take 1,000 mg by mouth every 6 (six) hours as needed for moderate pain., Disp: , Rfl:    Adalimumab-fkjp, 2 Syringe, 40 MG/0.8ML PSKT, Inject into the skin., Disp: , Rfl:    cyclobenzaprine  (FLEXERIL ) 10 MG tablet, Take 1 tablet (10 mg total) by mouth 3 (three) times daily as needed for muscle spasms., Disp: 30 tablet, Rfl: 2   diphenhydrAMINE  (BENADRYL ) 25 MG tablet, Take 1 tablet (25 mg total) by mouth every 6 (six) hours as needed., Disp: 30 tablet, Rfl: 0   docusate sodium  (COLACE) 100 MG capsule, Take 1 capsule (100 mg total) by mouth 2 (two) times daily., Disp: 10 capsule, Rfl: 0   erythromycin  ophthalmic ointment, Place into both eyes 4 (four) times daily., Disp: 3.5 g, Rfl: 0   fluconazole  (DIFLUCAN ) 150 MG tablet, Take by mouth., Disp: , Rfl:    lidocaine  (LIDODERM ) 5 %, Place 1 patch onto the skin daily. Remove & Discard patch within 12 hours or as directed by MD, Disp: 30 patch, Rfl: 0   ondansetron  (ZOFRAN -ODT) 4 MG disintegrating tablet, Take 1 tablet (4 mg total) by mouth every 8 (eight) hours as needed for nausea or vomiting., Disp: 20 tablet, Rfl: 0   Prenatal Vit-Fe Fumarate-FA (PRENATAL VITAMIN PO), Take 1 tablet by mouth daily., Disp: , Rfl:     tranexamic acid  (LYSTEDA ) 650 MG TABS tablet, Take 2 tablets (1,300 mg total) by mouth 3 (three) times daily. Take during menses for a maximum of five days, Disp: 30 tablet, Rfl: 2   triamcinolone cream (KENALOG) 0.1 %, Apply topically., Disp: , Rfl:    Vitamin D , Ergocalciferol , (DRISDOL ) 1.25 MG (50000 UNIT) CAPS capsule, Take by mouth., Disp: , Rfl:    WEGOVY 0.25 MG/0.5ML SOAJ, , Disp: , Rfl:    WEGOVY 0.5 MG/0.5ML SOAJ, , Disp: , Rfl:    Allergies  Allergen Reactions   Chlorhexidine  Itching and Rash    Severe itching, redness.    Tramadol  Nausea And Vomiting    Past Medical History:  Diagnosis Date   Anemia    Anxiety    no meds   Bipolar disorder (HCC)    Blood transfusion without reported diagnosis    for pp anemia not PPH   Depression    no meds   Gastritis    2 bouts   H/O seasonal allergies    Migraine    migraines   Migraine headache 04/25/2022   Pain, dental 09/19/2022   Psoriasis    Vision abnormalities    wears glasses   Vitamin D  deficiency 01/31/2018     Past Surgical History:  Procedure Laterality Date   DILATION AND CURETTAGE OF UTERUS     DILATION AND EVACUATION N/A 09/20/2017   Procedure: SUCTION DILATATION AND EVACUATION;  Surgeon: Izell Harari, MD;  Location: [facility];  Service: Gynecology;  Laterality: N/A;   ESOPHAGOGASTRODUODENOSCOPY N/A 10/17/2012   Procedure: ESOPHAGOGASTRODUODENOSCOPY (EGD);  Surgeon: Fairy VEAR Gaskins, MD;  Location: [facility] OR;  Service: Gastroenterology;  Laterality: N/A;   THERAPEUTIC ABORTION  09/16/2017   Clinic   WISDOM TOOTH EXTRACTION      Family History  Problem Relation Age of Onset   Cholelithiasis Mother    Hyperlipidemia Mother    Hypertension Mother    Mental illness Mother    Thyroid  disease Mother    Cholelithiasis Maternal Uncle    Hyperlipidemia Maternal Uncle    Hypertension Maternal Uncle    Cholelithiasis Maternal Grandmother    Arthritis Maternal Grandmother    COPD Maternal Grandmother     Diabetes Maternal Grandmother    Heart disease Maternal Grandmother    Hyperlipidemia Maternal Grandmother    Hypertension Maternal Grandmother    Stroke Maternal Grandmother    Learning disabilities Paternal Grandmother    Mental illness Paternal Grandmother    Cancer Paternal Grandmother    Hyperlipidemia Paternal Grandfather    Diabetes Paternal Grandfather    Depression Father    Hypertension Father    Ulcers Neg Hx    Alcohol abuse Neg Hx    Asthma Neg Hx    Birth defects Neg Hx    Drug abuse Neg Hx    Early death Neg Hx    Hearing loss Neg Hx    Kidney disease Neg Hx    Mental retardation Neg Hx    Miscarriages / Stillbirths Neg Hx    Vision loss Neg Hx     Social History   Tobacco Use   Smoking status: Never   Smokeless tobacco: Never  Vaping Use   Vaping status: Never Used  Substance Use Topics   Alcohol use: No   Drug use: No    ROS   Objective:   Vitals: BP (!) 133/92 (BP Location: Left Wrist)   Pulse 91   Temp 99 F (37.2 C) (Oral)   Resp 20   SpO2 96%   Physical Exam Constitutional:      General: She is not in acute distress.    Appearance: Normal appearance. She is well-developed and normal weight. She is not ill-appearing, toxic-appearing or diaphoretic.  HENT:     Head: Normocephalic and atraumatic.     Right Ear: Tympanic membrane, ear canal and external ear normal. No drainage or tenderness. No middle ear effusion. There is no impacted cerumen. Tympanic membrane is not erythematous or bulging.     Left Ear: Tympanic membrane, ear canal and external ear normal. No drainage or tenderness.  No middle ear effusion. There is no impacted cerumen. Tympanic membrane is not erythematous or bulging.     Nose: Congestion and rhinorrhea present.     Mouth/Throat:     Mouth: Mucous membranes are moist. No oral lesions.     Pharynx: No pharyngeal swelling, oropharyngeal exudate, posterior oropharyngeal erythema or uvula swelling.     Tonsils: No  tonsillar exudate or tonsillar abscesses.  Eyes:     General: No scleral icterus.       Right eye: No discharge.        Left eye: No discharge.     Extraocular Movements: Extraocular movements intact.     Right eye: Normal extraocular motion.     Left eye: Normal extraocular motion.     Conjunctiva/sclera: Conjunctivae normal.  Cardiovascular:     Rate and Rhythm: Normal rate and regular rhythm.     Heart  sounds: Normal heart sounds. No murmur heard.    No friction rub. No gallop.  Pulmonary:     Effort: Pulmonary effort is normal. No respiratory distress.     Breath sounds: No stridor. No wheezing, rhonchi or rales.  Chest:     Chest wall: No tenderness.  Musculoskeletal:     Cervical back: Normal range of motion and neck supple.  Lymphadenopathy:     Cervical: No cervical adenopathy.  Skin:    General: Skin is warm and dry.  Neurological:     General: No focal deficit present.     Mental Status: She is alert and oriented to person, place, and time.  Psychiatric:        Mood and Affect: Mood normal.        Behavior: Behavior normal.     Assessment and Plan :   PDMP not reviewed this encounter.  1. Viral respiratory infection   2. Allergic rhinitis, unspecified seasonality, unspecified trigger    Deferred testing given timeline of illness.  Suspect viral URI, viral syndrome. Physical exam findings reassuring and vital signs stable for discharge. Advised supportive care, offered prednisone  as patient requested aggressive management. Deferred imaging given clear cardiopulmonary exam, hemodynamically stable vital signs. Counseled patient on potential for adverse effects with medications prescribed/recommended today, ER and return-to-clinic precautions discussed, patient verbalized understanding.     Christopher Savannah, PA-C 06/30/24 [id number]

## 2024-06-30 NOTE — ED Triage Notes (Signed)
 Pt c/o cough, sore throat, body aches, fever, head congestion 5-6 days-last ibuprofen  2pm-NAD-steady gait

## 2024-06-30 NOTE — Discharge Instructions (Addendum)
 We will manage this as a viral respiratory infection. For sore throat or cough try using a honey-based tea. Use 3 teaspoons of honey with juice squeezed from half lemon. Place shaved pieces of ginger into 1/2-1 cup of water  and warm over stove top. Then mix the ingredients and repeat every 4 hours as needed. Please take Tylenol  500mg -650mg  once every 6 hours for fevers, aches and pains. Hydrate very well with at least 2 liters (64 ounces) of water . Eat light meals such as soups (chicken and noodles, chicken wild rice, vegetable).  Do not eat any foods that you are allergic to.  Start an antihistamine like Zyrtec  (10mg  daily) for postnasal drainage, sinus congestion.  You can take this together with prednisone  course for 5 days to help with your significant respiratory symptoms. Use cough syrup as needed.

## 2024-07-15 ENCOUNTER — Encounter (INDEPENDENT_AMBULATORY_CARE_PROVIDER_SITE_OTHER): Payer: Self-pay | Admitting: Physician Assistant

## 2024-07-15 ENCOUNTER — Ambulatory Visit (INDEPENDENT_AMBULATORY_CARE_PROVIDER_SITE_OTHER): Admitting: Physician Assistant

## 2024-07-15 VITALS — BP 122/79 | HR 77 | Temp 98.7°F | Ht 64.0 in | Wt 359.0 lb

## 2024-07-15 DIAGNOSIS — K760 Fatty (change of) liver, not elsewhere classified: Secondary | ICD-10-CM

## 2024-07-15 DIAGNOSIS — R7303 Prediabetes: Secondary | ICD-10-CM

## 2024-07-15 DIAGNOSIS — E282 Polycystic ovarian syndrome: Secondary | ICD-10-CM | POA: Diagnosis not present

## 2024-07-15 DIAGNOSIS — E559 Vitamin D deficiency, unspecified: Secondary | ICD-10-CM

## 2024-07-15 DIAGNOSIS — L409 Psoriasis, unspecified: Secondary | ICD-10-CM

## 2024-07-15 DIAGNOSIS — M25569 Pain in unspecified knee: Secondary | ICD-10-CM | POA: Diagnosis not present

## 2024-07-15 DIAGNOSIS — D649 Anemia, unspecified: Secondary | ICD-10-CM

## 2024-07-15 DIAGNOSIS — G8929 Other chronic pain: Secondary | ICD-10-CM

## 2024-07-15 DIAGNOSIS — D509 Iron deficiency anemia, unspecified: Secondary | ICD-10-CM

## 2024-07-15 DIAGNOSIS — O99814 Abnormal glucose complicating childbirth: Secondary | ICD-10-CM

## 2024-07-15 DIAGNOSIS — O139 Gestational [pregnancy-induced] hypertension without significant proteinuria, unspecified trimester: Secondary | ICD-10-CM

## 2024-07-15 DIAGNOSIS — Z789 Other specified health status: Secondary | ICD-10-CM

## 2024-07-15 DIAGNOSIS — Z6841 Body Mass Index (BMI) 40.0 and over, adult: Secondary | ICD-10-CM

## 2024-07-15 DIAGNOSIS — F32A Depression, unspecified: Secondary | ICD-10-CM

## 2024-07-15 DIAGNOSIS — E66813 Obesity, class 3: Secondary | ICD-10-CM

## 2024-07-15 NOTE — Progress Notes (Signed)
 Office: [phone]  /  Fax: [phone]   Initial Visit    Traci Castro was seen in clinic today to evaluate for obesity. She is interested in losing weight to improve overall health and reduce the risk of weight related complications. She presents today to review program treatment options, initial physical assessment, and evaluation.     She was referred by: Friend or Family- Mom is patient   When asked what else they would like to accomplish? She states: Adopt a healthier eating pattern and lifestyle, Improve energy levels and physical activity, Improve existing medical conditions, Improve quality of life, Improve appearance, Improve self-confidence, and Lose 50-75 lbs lbs  When asked how has your weight affected you? She states: Has affected self-esteem, Contributed to medical problems, Contributed to orthopedic problems or mobility issues, Having fatigue, Having poor endurance, Problems with eating patterns, and Has affected mood   Weight history: Traci Castro is a 28 year old female who presents for initial consultation for obesity treatment.  She has experienced weight gain since the birth of her first child, attributing it to 'baby weight.' She has four children and was previously active in high school, participating in the marching band, but her activity level decreased after having children.  Her highest non-pregnant weight was 367 pounds. She has attempted weight loss multiple times, including the use of Wegovy and phentermine, without significant success. She has a target weight of 190 pounds and aims to lose 50 to 75 pounds initially.  She is currently on oxycodone  and meloxicam for pain management/sees pain management specialist due to a torn meniscus from high school.  She reports fatigue, poor endurance, and the need to rest when her children are active. 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 She has a history of skipping meals, especially while on Wegovy, and her weight fluctuates with stress levels.  Her past medical history includes prediabetes, arthritis in the knee, fatty liver disease, vitamin D  deficiency, anemia, and psoriasis. She has a history of high blood pressure during pregnancy and suspects she may have sleep apnea due to snoring. She also reports overactive bladder and possible ovarian cysts. No history of high cholesterol, heart disease, lung disease, or kidney disease.  Her family history includes obesity and breast cancer in her grandmother. She experiences depression and anxiety, which she associates with her weight issues. She has not undergone metabolic surgery and has not been on any obesogenic medications recently. She has tried various weight loss methods, including intermittent fasting, high protein diets, and veganism, but is not currently following any specific plan. 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  Highest weight: 367 lbs  Some associated conditions: Hypertension, Arthritis:knee, MASLD, Prediabetes, Overactive bladder, PCOS, Vitamin D  Deficiency, and Other: anemia, psoriasis and immuno suppression   Contributing factors: family history of obesity, disruption of circadian rhythm / sleep disordered breathing, consumption of processed foods, moderate to high levels of stress, chronic skipping of meals, mental health problems, strong orexigenic signaling and/or inadequate inhibitory control , slow metabolism for age, need for convenience due to lack of time, enticing relationships and enviroment, multiple weight loss attempts in the past, hectic pace of life, need for convenient foods, and self - critic or all-or-none mindset  Weight promoting medications identified: None  Prior weight loss attempts: Meal Replacements, Intermittent fasting, High  Protein, and vegan  Current nutrition plan: None  Current level of physical activity: NEAT  Current or previous pharmacotherapy: GLP-1, Phentermine, and Buproprion Wegovy x 3-4 months but did not have any weight loss.   Response to medication: Ineffective so it was discontinued   Past medical history includes:   Past Medical History:  Diagnosis Date   Anemia    Anxiety    no meds   Bipolar disorder (HCC)    Blood transfusion without reported diagnosis    for pp anemia not PPH   Depression    no meds   Gastritis    2 bouts   H/O seasonal allergies    Migraine    migraines   Migraine headache 04/25/2022   Pain, dental 09/19/2022   Psoriasis    Vision abnormalities    wears glasses   Vitamin D  deficiency 01/31/2018     Objective    BP 122/79   Pulse 77   Temp 98.7 F (37.1 C)   Ht 5' 4 (1.626 m)   Wt (!) 359 lb (162.8 kg)   SpO2 100%   BMI 61.62 kg/m  She was weighed on the bioimpedance scale: Body mass index is 61.62 kg/m.  Body Fat%:58.5%, Visceral Fat Rating:23, Weight trend over the last 12 months: Increasing  General:  Alert, oriented and cooperative. Patient is in no acute distress.  Respiratory: Normal respiratory effort, no problems with respiration noted   Gait: able to ambulate independently  Mental Status: Normal mood and affect. Normal behavior. Normal judgment and thought content.   DIAGNOSTIC DATA REVIEWED:  BMET    Component Value Date/Time   NA 134 (L) 02/03/2023 1350   NA 135 01/23/2023 1147   K 4.2 02/03/2023 1350   CL 109 02/03/2023 1350   CO2 17 (L) 02/03/2023 1350   GLUCOSE 93 02/03/2023 1350   BUN 7 02/03/2023 1350   BUN 5 (L) 01/23/2023 1147   CREATININE 0.61 02/03/2023 1350   CALCIUM  8.7 (L) 02/03/2023 1350   GFRNONAA >60 02/03/2023 1350   GFRAA >60 07/17/2018 0834   Lab Results  Component Value Date   HGBA1C 6.4 (H) 01/23/2023   HGBA1C 5.6 01/28/2018   No results found for: INSULIN CBC    Component Value  Date/Time   WBC 8.2 02/03/2023 1350   RBC 4.27 02/03/2023 1350   HGB 9.5 (L) 02/03/2023 1350   HGB 9.2 (L) 01/23/2023 1423   HGB 11.6 10/06/2015 0000   HCT 32.6 (L) 02/03/2023 1350   HCT 30.1 (L) 01/23/2023 1423   HCT 36 10/06/2015 0000   PLT 328 02/03/2023 1350   PLT 342 01/23/2023 1423   PLT 335 10/06/2015 0000   MCV 76.3 (L) 02/03/2023 1350   MCV 73 (L) 01/23/2023 1423   MCH 22.2 (L) 02/03/2023 1350   MCHC 29.1 (L) 02/03/2023 1350   RDW 16.5 (H) 02/03/2023 1350   RDW 15.2 01/23/2023 1423   Iron/TIBC/Ferritin/ %Sat No results found for: IRON, TIBC, FERRITIN, IRONPCTSAT Lipid Panel  No results found for: CHOL, TRIG, HDL, CHOLHDL, VLDL, LDLCALC, LDLDIRECT Hepatic Function Panel     Component Value Date/Time   PROT 6.0 (L) 02/03/2023 1350   PROT 6.1 01/23/2023 1147   ALBUMIN 2.5 (L) 02/03/2023 1350   ALBUMIN 3.3 (L) 01/23/2023 1147   AST 18 02/03/2023 1350   ALT 11 02/03/2023 1350   ALKPHOS 190 (H) 02/03/2023 1350   BILITOT <0.1 (L) 02/03/2023 1350   BILITOT 0.2 01/23/2023 1147      Component Value Date/Time  TSH 0.864 07/16/2018 1027     Assessment and Plan   Impaired glucose tolerance in pregnancy, delivered  Gestational hypertension, antepartum  Psoriasis  Chronic knee pain, unspecified laterality  Under care of pain management specialist  Metabolic dysfunction-associated fatty liver disease (MAFLD)  Vitamin D  deficiency  Iron deficiency anemia, unspecified iron deficiency anemia type  Class 3 severe obesity due to excess calories with serious comorbidity and body mass index (BMI) of 60.0 to 69.9 in adult [facility])  Current BMI 61.6  Assessment and Plan Assessment & Plan Obesity with fatty liver disease, prediabetes, and polycystic ovarian syndrome Obesity with a history of weight gain post-pregnancy, currently at 367 lbs. Fatty liver disease, prediabetes, and polycystic ovarian syndrome contribute to weight management  challenges. Previous weight loss attempts with Wegovy and phentermine were unsuccessful. High visceral fat rating of 23 indicates increased risk for chronic diseases such as type 2 diabetes, cardiovascular disease, and certain cancers. Emphasis on lifestyle changes and potential future use of newer medications like Zepbound, which may offer better weight loss outcomes than Wegovy. - Ordered fasting metabolic testing to assess individual nutritional needs. - Initiated lifestyle modification program focusing on healthy eating patterns and regular physical activity. - Discussed potential future use of newer weight loss medications like Zepbound. - Encouraged regular follow-up to monitor progress and adjust treatment as needed.  Knee pain due to meniscal tear and osteoarthritis Chronic knee pain secondary to meniscal tear and osteoarthritis, exacerbated by obesity. Managed with oxycodone  and meloxicam as part of a pain management program. - Continue current pain management regimen with oxycodone  and meloxicam. - Encouraged weight loss to reduce stress on joints.  Depression May contribute to weight management challenges and overall health. - Encouraged participation in weight management program to improve mental health and overall well-being.  Psoriasis Affects immune system and may contribute to overall health challenges.  Vitamin D  deficiency  Anemia Noted in medical history.        Obesity Treatment / Action Plan:  Patient will work on garnering support from family and friends to begin weight loss journey. Will work on eliminating or reducing the presence of highly palatable, calorie dense foods in the home. Will complete provided nutritional and psychosocial assessment questionnaire before the next appointment. Will be scheduled for indirect calorimetry to determine resting energy expenditure in a fasting state.  This will allow us  to create a reduced calorie, high-protein meal plan to  promote loss of fat mass while preserving muscle mass. Will think about ideas on how to incorporate physical activity into their daily routine. Will work on reducing intake of added sugars, simple sugars and processed carbs. Will avoid skipping meals which may result in increased hunger signals and overeating at certain times. Will reduce liquid calories and sugary drinks from diet. Will reduce the frequency of eating out and making healthier choices by advanced menu planning. Will work on managing stress via relaxation methods as this may result in unhealthy eating patterns. Will work on reading labels, making healthier choices and watching portion sizes. Counseled on the health benefits of losing 5%-15% of total body weight. Will work on improving sleep hygiene and trying to obtain at least 7 hours of sleep. Was counseled on nutritional approaches to weight loss and benefits of reducing processed foods and consuming plant-based foods and high quality protein as part of nutritional weight management. Was counseled on pharmacotherapy and role as an adjunct in weight management.  Will work on increasing water  intake with a goal of  125 ounces for men and 91 ounces for women.   Obesity Education Performed Today:  She was weighed on the bioimpedance scale and results were discussed and documented in the synopsis.  We discussed obesity as a disease and the importance of a more detailed evaluation of all the factors contributing to the disease.  We discussed the importance of long term lifestyle changes which include nutrition, exercise and behavioral modifications as well as the importance of customizing this to her specific health and social needs.  We discussed the benefits of reaching a healthier weight to alleviate the symptoms of existing conditions and reduce the risks of the biomechanical, metabolic and psychological effects of obesity.  We reviewed the four pillars of obesity medicine and  importance of using a multimodal approach.  We reviewed the basic principles in weight management.   Traci Castro appears to be in the action stage of change and states they are ready to start intensive lifestyle modifications and behavioral modifications.  I have spent 29 minutes in the care of the patient today including: 5 minutes before the visit reviewing and preparing the chart. 22 minutes face-to-face assessing and reviewing listed medical problems as outlined in obesity care plan, providing nutritional and behavioral counseling on topics outlined in the obesity care plan, counseling regarding anti-obesity medication as outlined in obesity care plan, independently interpreting test results and goals of care, as described in assessment and plan, reviewing and discussing biometric information and progress, and reviewing latest PCP notes and specialist consultations 5 minutes after the visit updating chart and documentation of encounter.  Reviewed by clinician on day of visit: allergies, medications, problem list, medical history, surgical history, family history, social history, and previous encounter notes pertinent to obesity diagnosis.   Bebe Moncure,PA-C

## 2024-08-24 ENCOUNTER — Telehealth: Admitting: Family Medicine

## 2024-08-24 DIAGNOSIS — K529 Noninfective gastroenteritis and colitis, unspecified: Secondary | ICD-10-CM

## 2024-08-24 MED ORDER — ONDANSETRON HCL 4 MG PO TABS: 4.0000 mg | ORAL_TABLET | Freq: Three times a day (TID) | ORAL | 0 refills | Status: AC | PRN

## 2024-08-24 NOTE — Progress Notes (Signed)

## 2024-09-17 ENCOUNTER — Telehealth: Admitting: Family

## 2024-09-17 DIAGNOSIS — Z20828 Contact with and (suspected) exposure to other viral communicable diseases: Secondary | ICD-10-CM

## 2024-09-17 DIAGNOSIS — R6889 Other general symptoms and signs: Secondary | ICD-10-CM | POA: Diagnosis not present

## 2024-09-17 DIAGNOSIS — R051 Acute cough: Secondary | ICD-10-CM | POA: Diagnosis not present

## 2024-09-17 MED ORDER — PROMETHAZINE-DM 6.25-15 MG/5ML PO SYRP: 5.0000 mL | ORAL_SOLUTION | Freq: Three times a day (TID) | ORAL | 0 refills | Status: AC | PRN

## 2024-09-17 MED ORDER — FLUTICASONE PROPIONATE 50 MCG/ACT NA SUSP: 2.0000 | Freq: Every day | NASAL | 6 refills | Status: AC

## 2024-09-17 MED ORDER — BENZONATATE 100 MG PO CAPS: 100.0000 mg | ORAL_CAPSULE | Freq: Two times a day (BID) | ORAL | 0 refills | Status: AC | PRN

## 2024-09-17 NOTE — Progress Notes (Signed)
 E visit for Flu like symptoms   We are sorry that you are not feeling well.  Here is how we plan to help! Based on what you have shared with me it looks like you may have a respiratory virus that may be influenza.  Influenza or the flu is  an infection caused by a respiratory virus. The flu virus is highly contagious and persons who did not receive their yearly flu vaccination may catch the flu from close contact.  We have anti-viral medications to treat the viruses that cause this infection. They are not a cure and only shorten the course of the infection. These prescriptions are most effective when they are given within the first 2 days of flu symptoms. Antiviral medications are indicated if you have a high risk of complications from the flu. You should  also consider an antiviral medication if you are in close contact with someone who is at risk. These medications can help patients avoid complications from the flu but have side effects that you should know.   Possible side effects from Tamiflu  or oseltamivir  include nausea, vomiting, diarrhea, dizziness, headaches, eye redness, sleep problems or other respiratory symptoms. You should not take Tamiflu  if you have an allergy to oseltamivir  or any to the ingredients in Tamiflu .    For nasal congestion, you may use an oral decongestant such as Mucinex  D or if you have glaucoma or high blood pressure use plain Mucinex .  Saline nasal spray or nasal drops can help and can safely be used as often as needed for congestion.  If you have a sore or scratchy throat, use a saltwater gargle-  to  teaspoon of salt dissolved in a 4-ounce to 8-ounce glass of warm water .  Gargle the solution for approximately 15-30 seconds and then spit.  It is important not to swallow the solution.  You can also use throat lozenges/cough drops and Chloraseptic spray to help with throat pain or discomfort.  Warm or cold liquids can also be helpful in relieving throat  pain.  For headache, pain or general discomfort, you can use Ibuprofen  or Tylenol  as directed.   Some authorities believe that zinc sprays or the use of Echinacea may shorten the course of your symptoms.  I have prescribed the following medications to help lessen symptoms: I have prescribed Tessalon  Perles 100 mg. You may take 1-2 capsules every 8 hours as needed for cough, I have prescribed Phenergan  DM 6.25 mg/15 mg. You make take one teaspoon / 5 ml every 4-6 hours as needed for cough, and I have prescribed Fluticasone  nasal spray 2 sprays in each nostril one time per dayasal spray 2 sprays in each nostril one time per day  You are to isolate at home until you have been fever-free for at least 24 hours without a fever-reducing medication, and symptoms have been steadily improving for 24 hours.  If you must be around other household members who do not have symptoms, you need to make sure that both you and the family members are masking consistently with a high-quality mask.  If you note any worsening of symptoms despite treatment, please seek an in-person evaluation ASAP. If you note any significant shortness of breath or any chest pain, please seek ED evaluation. Please do not delay care!  ANYONE WHO HAS FLU SYMPTOMS SHOULD: Stay home. The flu is highly contagious and going out or to work exposes others! Be sure to drink plenty of fluids. Water  is fine as well as fruit  juices, sodas and electrolyte beverages. You may want to stay away from caffeine  or alcohol. If you are nauseated, try taking small sips of liquids. How do you know if you are getting enough fluid? Your urine should be a pale yellow or almost colorless. Get rest. Taking a steamy shower or using a humidifier may help nasal congestion and ease sore throat pain. Using a saline nasal spray works much the same way. Cough drops, hard candies and sore throat lozenges may ease your cough. Line up a caregiver. Have someone check on you  regularly.  GET HELP RIGHT AWAY IF: You cannot keep down liquids or your medications. You become short of breath Your fell like you are going to pass out or loose consciousness. Your symptoms persist after you have completed your treatment plan  MAKE SURE YOU  Understand these instructions. Will watch your condition. Will get help right away if you are not doing well or get worse.  Your e-visit answers were reviewed by a board certified advanced clinical practitioner to complete your personal care plan.  Depending on the condition, your plan could have included both over the counter or prescription medications.  If there is a problem please reply  once you have received a response from your provider.  Your safety is important to us .  If you have drug allergies check your prescription carefully.    You can use MyChart to ask questions about todays visit, request a non-urgent call back, or ask for a work or school excuse for 24 hours related to this e-Visit. If it has been greater than 24 hours you will need to follow up with your provider, or enter a new e-Visit to address those concerns.  You will get an e-mail in the next two days asking about your experience.  I hope that your e-visit has been valuable and will speed your recovery. Thank you for using e-visits.   I have spent 5 minutes in review of e-visit questionnaire, review and updating patient chart, medical decision making and response to patient.   Bari Learn, FNP
